# Patient Record
Sex: Male | Born: 1955 | Race: Black or African American | Hispanic: No | Smoking: Current some day smoker
Health system: Southern US, Community
[De-identification: ages and names within clinical notes are randomized; demographics above are authoritative.]

## PROBLEM LIST (undated history)

## (undated) DIAGNOSIS — K219 Gastro-esophageal reflux disease without esophagitis: Secondary | ICD-10-CM

## (undated) DIAGNOSIS — I119 Hypertensive heart disease without heart failure: Secondary | ICD-10-CM

## (undated) DIAGNOSIS — M1990 Unspecified osteoarthritis, unspecified site: Secondary | ICD-10-CM

## (undated) DIAGNOSIS — Z955 Presence of coronary angioplasty implant and graft: Secondary | ICD-10-CM

## (undated) DIAGNOSIS — Z972 Presence of dental prosthetic device (complete) (partial): Secondary | ICD-10-CM

## (undated) DIAGNOSIS — I255 Ischemic cardiomyopathy: Secondary | ICD-10-CM

## (undated) DIAGNOSIS — I252 Old myocardial infarction: Secondary | ICD-10-CM

## (undated) DIAGNOSIS — F1010 Alcohol abuse, uncomplicated: Secondary | ICD-10-CM

## (undated) DIAGNOSIS — I2510 Atherosclerotic heart disease of native coronary artery without angina pectoris: Secondary | ICD-10-CM

## (undated) DIAGNOSIS — E785 Hyperlipidemia, unspecified: Secondary | ICD-10-CM

## (undated) DIAGNOSIS — F1411 Cocaine abuse, in remission: Secondary | ICD-10-CM

## (undated) DIAGNOSIS — I10 Essential (primary) hypertension: Secondary | ICD-10-CM

## (undated) DIAGNOSIS — Z720 Tobacco use: Secondary | ICD-10-CM

## (undated) HISTORY — PX: CORONARY ANGIOPLASTY WITH STENT PLACEMENT: SHX49

## (undated) HISTORY — DX: Gastro-esophageal reflux disease without esophagitis: K21.9

## (undated) HISTORY — PX: FACIAL COSMETIC SURGERY: SHX629

## (undated) HISTORY — PX: LACERATION REPAIR: SHX5168

---

## 2000-06-28 ENCOUNTER — Emergency Department (HOSPITAL_COMMUNITY): Admission: EM | Admit: 2000-06-28 | Discharge: 2000-06-28 | Payer: Self-pay

## 2004-12-06 ENCOUNTER — Ambulatory Visit: Payer: Self-pay | Admitting: Internal Medicine

## 2004-12-07 ENCOUNTER — Emergency Department (HOSPITAL_COMMUNITY): Admission: EM | Admit: 2004-12-07 | Discharge: 2004-12-07 | Payer: Self-pay | Admitting: Emergency Medicine

## 2004-12-23 ENCOUNTER — Ambulatory Visit: Payer: Self-pay | Admitting: Internal Medicine

## 2004-12-28 ENCOUNTER — Emergency Department (HOSPITAL_COMMUNITY): Admission: EM | Admit: 2004-12-28 | Discharge: 2004-12-29 | Payer: Self-pay | Admitting: Emergency Medicine

## 2006-08-15 ENCOUNTER — Ambulatory Visit: Payer: Self-pay | Admitting: Internal Medicine

## 2006-08-30 ENCOUNTER — Ambulatory Visit: Payer: Self-pay | Admitting: *Deleted

## 2006-09-05 ENCOUNTER — Emergency Department (HOSPITAL_COMMUNITY): Admission: EM | Admit: 2006-09-05 | Discharge: 2006-09-06 | Payer: Self-pay | Admitting: Emergency Medicine

## 2007-02-24 ENCOUNTER — Emergency Department (HOSPITAL_COMMUNITY): Admission: EM | Admit: 2007-02-24 | Discharge: 2007-02-25 | Payer: Self-pay | Admitting: Emergency Medicine

## 2007-02-27 ENCOUNTER — Ambulatory Visit: Payer: Self-pay | Admitting: Internal Medicine

## 2007-05-19 ENCOUNTER — Emergency Department (HOSPITAL_COMMUNITY): Admission: EM | Admit: 2007-05-19 | Discharge: 2007-05-19 | Payer: Self-pay | Admitting: Emergency Medicine

## 2007-05-30 ENCOUNTER — Encounter (INDEPENDENT_AMBULATORY_CARE_PROVIDER_SITE_OTHER): Payer: Self-pay | Admitting: *Deleted

## 2007-06-15 ENCOUNTER — Emergency Department (HOSPITAL_COMMUNITY): Admission: EM | Admit: 2007-06-15 | Discharge: 2007-06-15 | Payer: Self-pay | Admitting: Emergency Medicine

## 2007-06-21 ENCOUNTER — Ambulatory Visit: Payer: Self-pay | Admitting: Internal Medicine

## 2007-12-26 ENCOUNTER — Emergency Department (HOSPITAL_COMMUNITY): Admission: EM | Admit: 2007-12-26 | Discharge: 2007-12-26 | Payer: Self-pay | Admitting: Family Medicine

## 2007-12-26 ENCOUNTER — Ambulatory Visit: Payer: Self-pay | Admitting: Internal Medicine

## 2007-12-26 ENCOUNTER — Ambulatory Visit (HOSPITAL_COMMUNITY): Admission: RE | Admit: 2007-12-26 | Discharge: 2007-12-26 | Payer: Self-pay | Admitting: Internal Medicine

## 2007-12-27 ENCOUNTER — Emergency Department (HOSPITAL_COMMUNITY): Admission: EM | Admit: 2007-12-27 | Discharge: 2007-12-27 | Payer: Self-pay | Admitting: Emergency Medicine

## 2008-01-03 ENCOUNTER — Ambulatory Visit: Payer: Self-pay | Admitting: Internal Medicine

## 2008-01-03 ENCOUNTER — Emergency Department (HOSPITAL_COMMUNITY): Admission: EM | Admit: 2008-01-03 | Discharge: 2008-01-04 | Payer: Self-pay | Admitting: Emergency Medicine

## 2008-01-04 ENCOUNTER — Ambulatory Visit (HOSPITAL_COMMUNITY): Admission: RE | Admit: 2008-01-04 | Discharge: 2008-01-04 | Payer: Self-pay | Admitting: Internal Medicine

## 2008-01-18 ENCOUNTER — Ambulatory Visit: Payer: Self-pay | Admitting: Internal Medicine

## 2008-01-28 ENCOUNTER — Ambulatory Visit (HOSPITAL_COMMUNITY): Admission: RE | Admit: 2008-01-28 | Discharge: 2008-01-28 | Payer: Self-pay | Admitting: Internal Medicine

## 2008-03-12 ENCOUNTER — Ambulatory Visit: Payer: Self-pay | Admitting: Internal Medicine

## 2008-06-19 IMAGING — CR DG CHEST 2V
2 series · 2 of 2 positions shown · non-contrast
Comparison: none

CLINICAL DATA: Chest pain.
CHEST ? 2 VIEW:

[w chest pa]
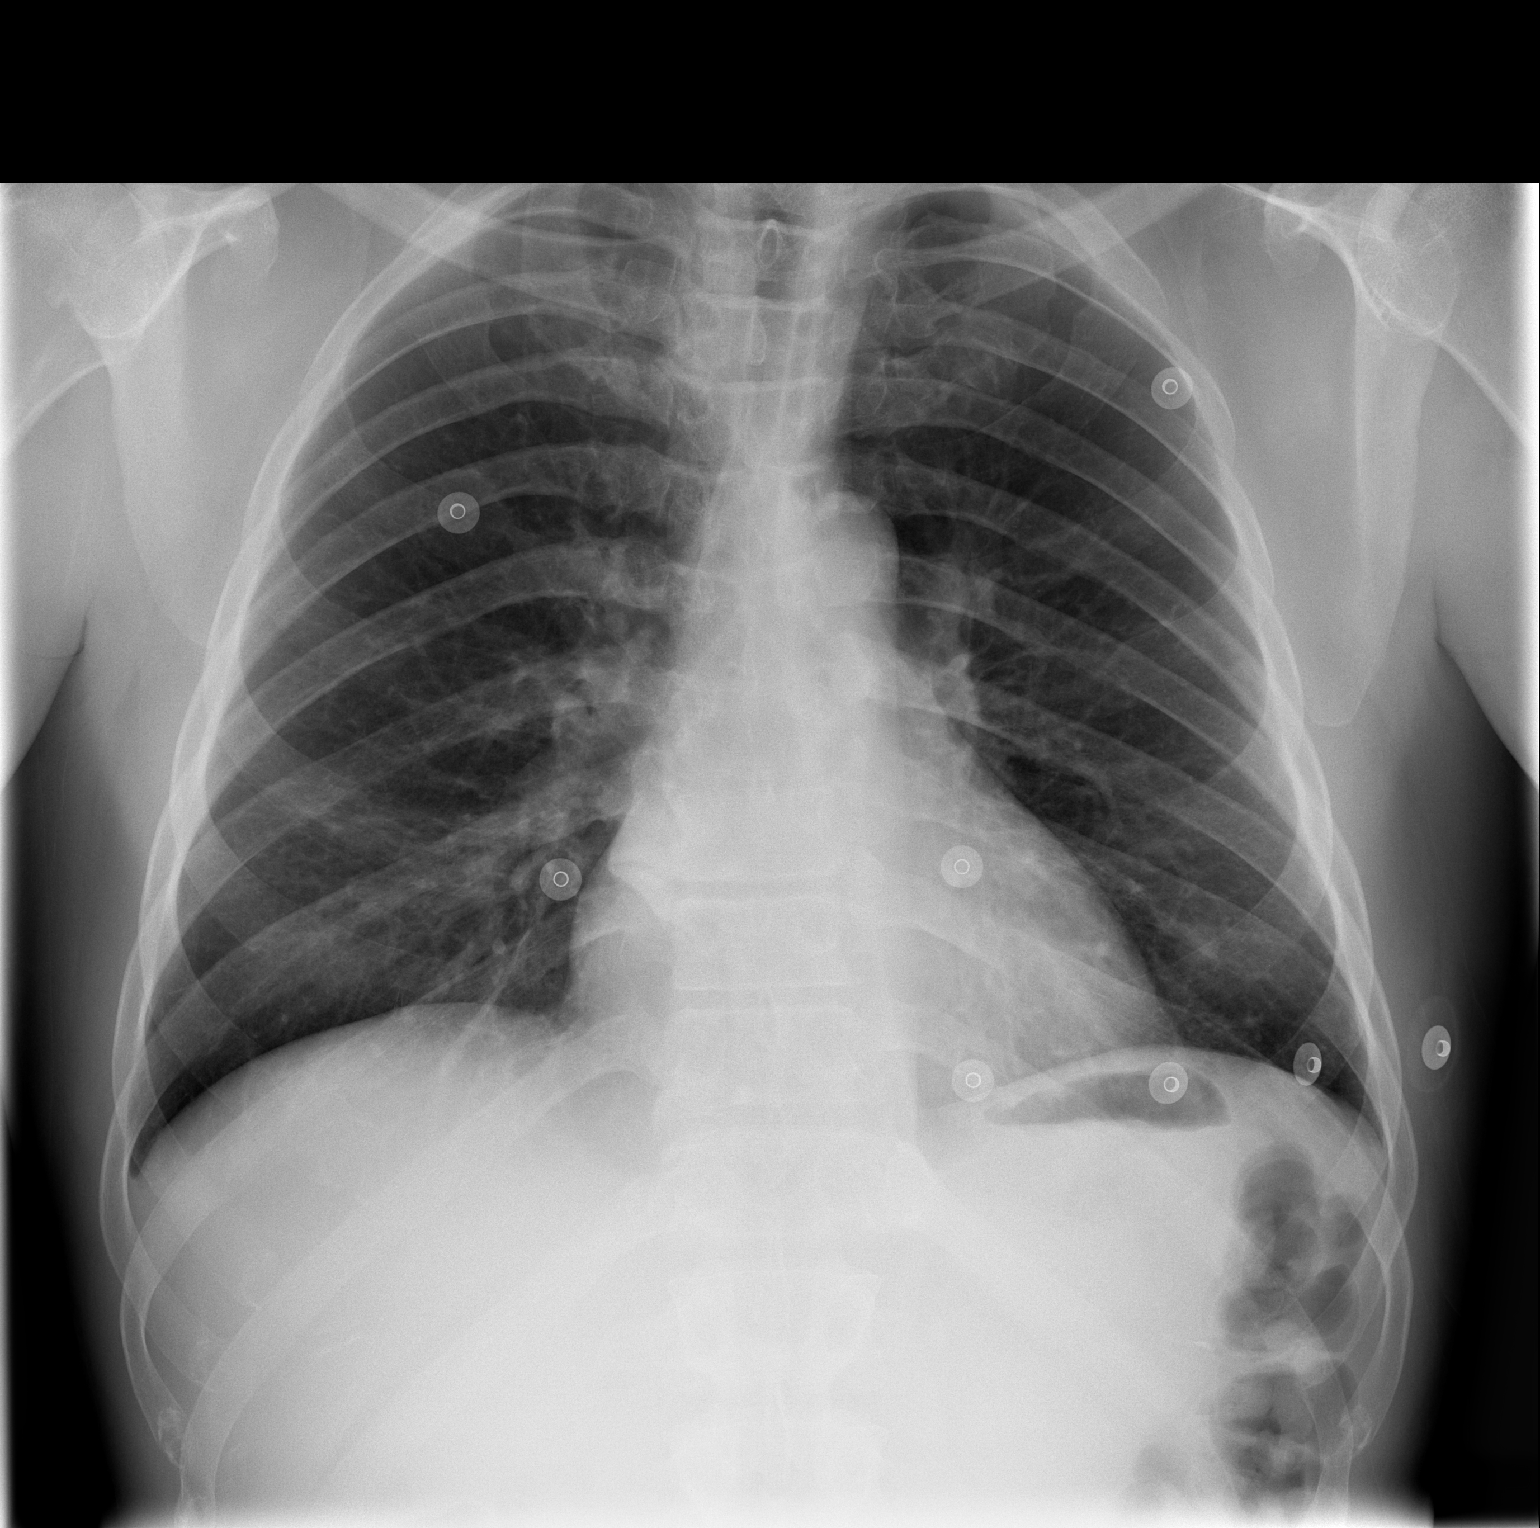

[w chest lat]
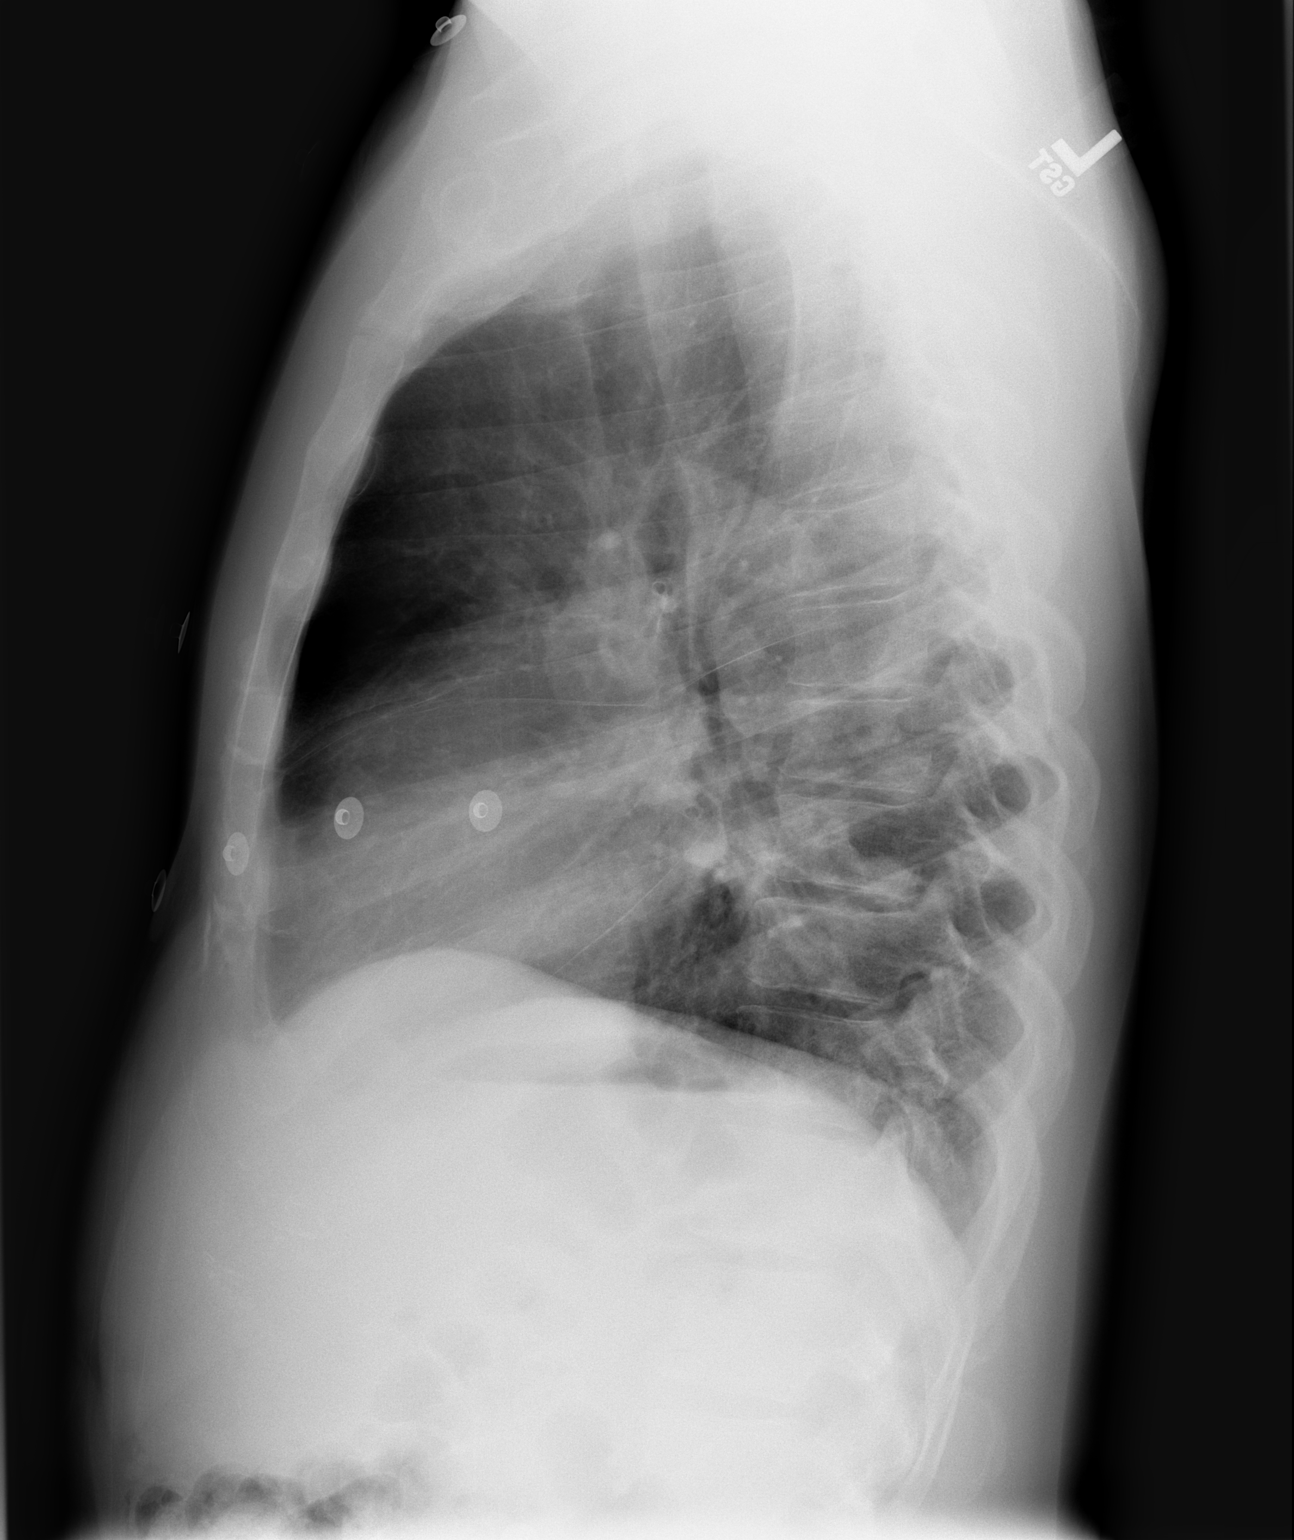

[2 of 2 positions shown; findings below may reference images not displayed]

FINDINGS: The cardiomediastinal silhouette is within normal limits.  Prominent infrahilar/perihilar region on the lateral film is noted, which could represent overlapping structures, but cannot entirely exclude mass.  Consider CT if no comparison films are available.  Mild peribronchial thickening and mild basilar atelectasis/scarring noted.
IMPRESSION: 1.  Prominent infrahilar/perihilar region on the lateral film.  If no old films are available for comparison, consider CT. 
2.  Mild peribronchial thickening and mild basilar atelectasis/scarring.

## 2008-06-19 IMAGING — CT CT CHEST W/ CM
3 series · 18 of 30 positions shown, 19 images · IV contrast (omnipaque)
Comparison: none

CLINICAL DATA: Chest pain.  Prominent perihilar region on recent chest x-ray. 
CHEST CT WITH CONTRAST:
TECHNIQUE: Multidetector CT imaging of the chest was performed following the standard protocol during bolus administration of intravenous contrast.
Contrast:  80 cc Omnipaque 300.

[Series 2: routine chest · axial · 0.70mm/px · z∈[-293,-143]mm · 4 of 61 slices shown, 5 images]
[im 16/61  mediastinal]
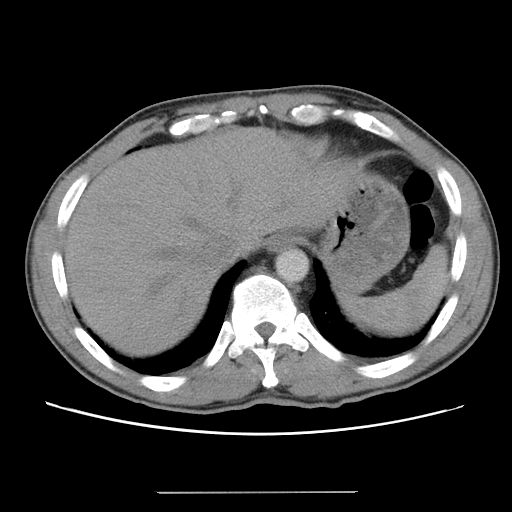
[im 16/61  lung]
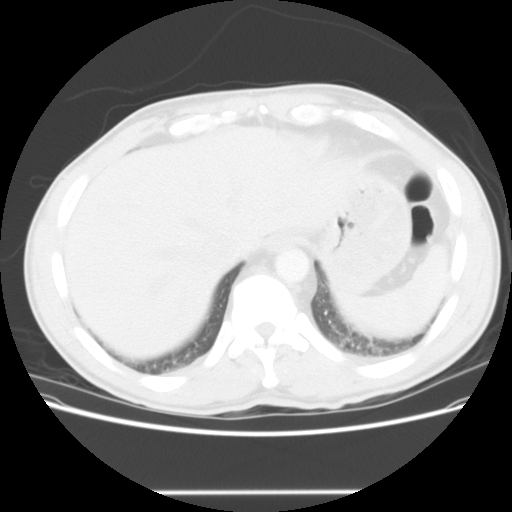
[im 31/61  lung]
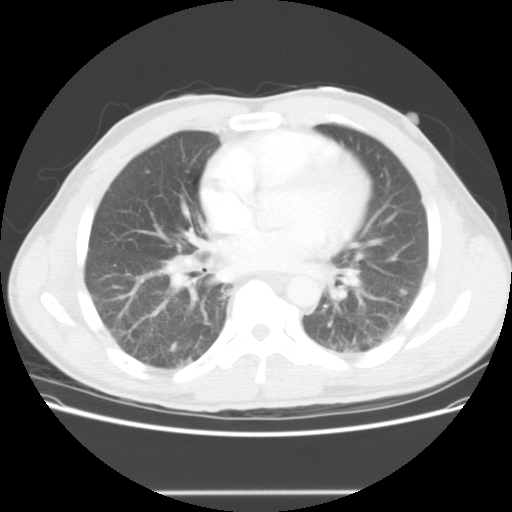
[im 32/61  lung]
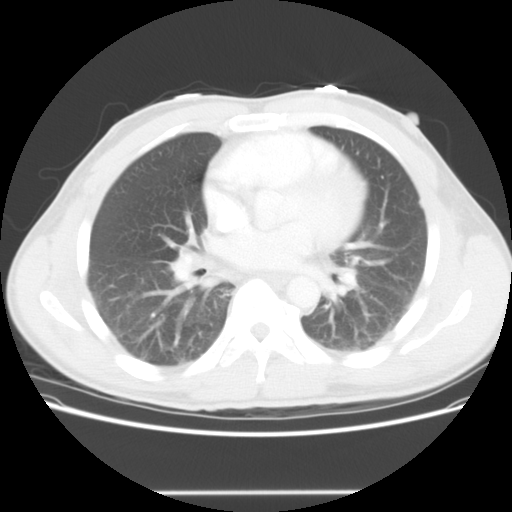
[im 46/61  lung]
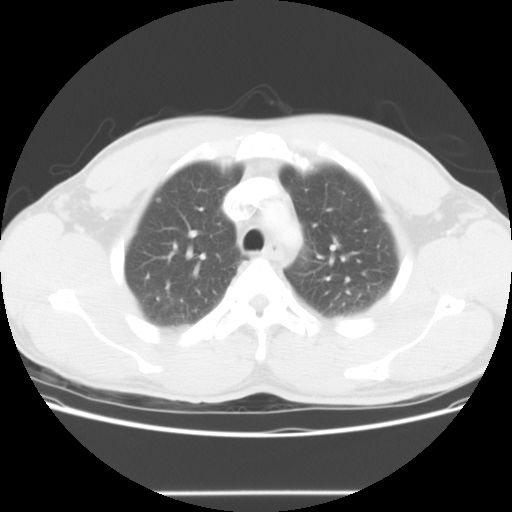

[Series 400: reformatted · sagittal · 0.70mm/px · 8 of 146 slices shown (1 of 2)]
[im 14/146  lung]
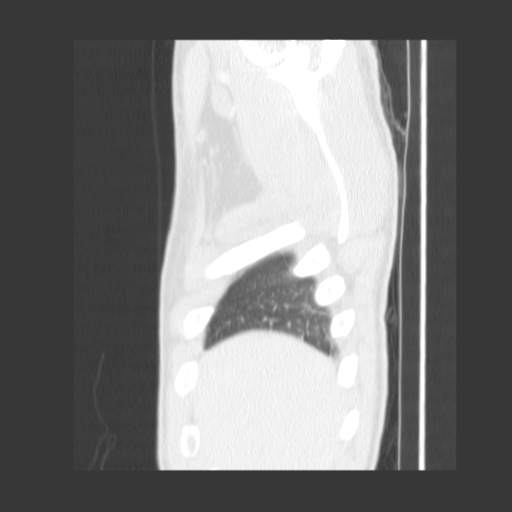
[im 40/146  lung]
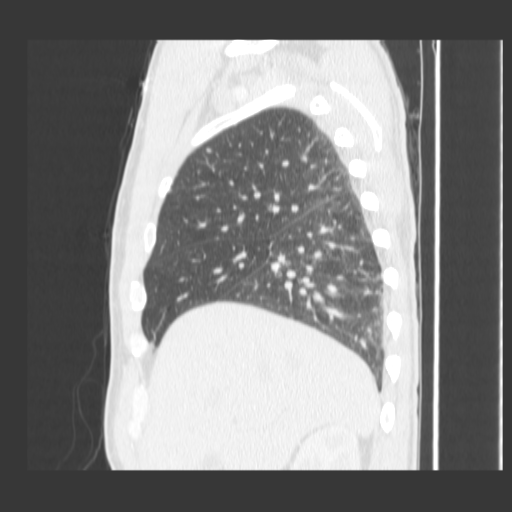
[im 53/146  lung]
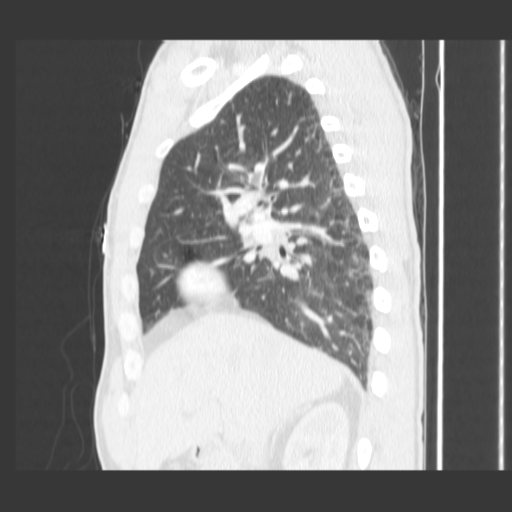
[im 66/146  lung]
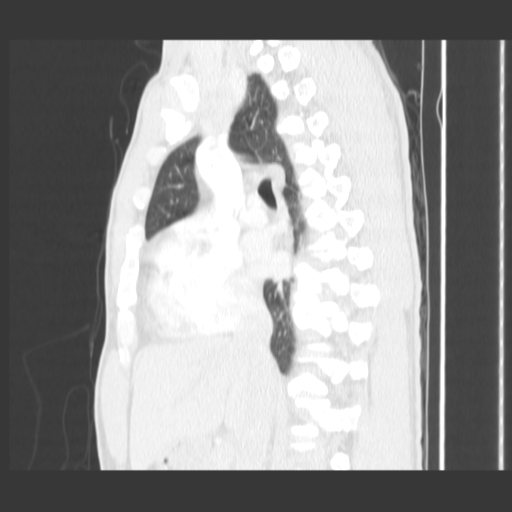
[im 80/146  lung]
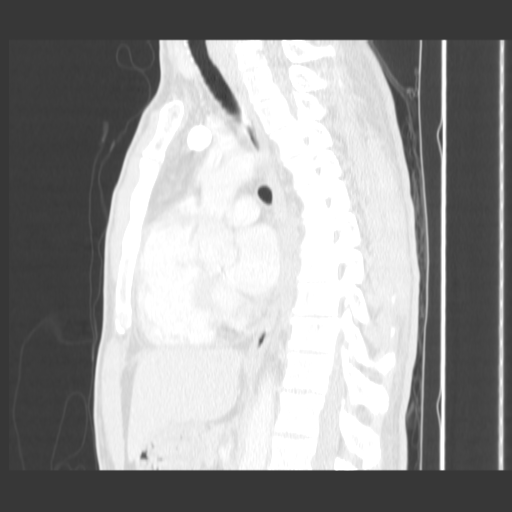
[im 93/146  lung]
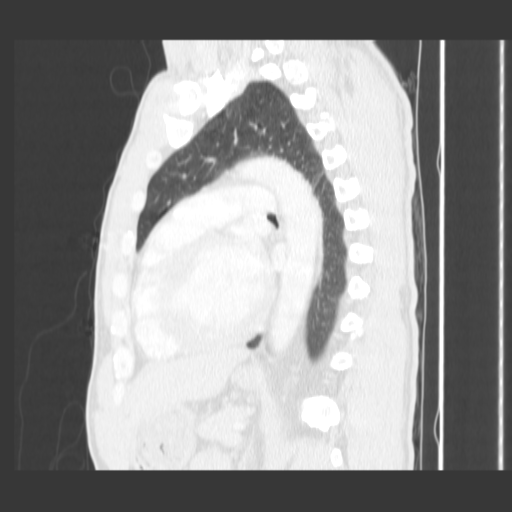
[im 106/146  lung]
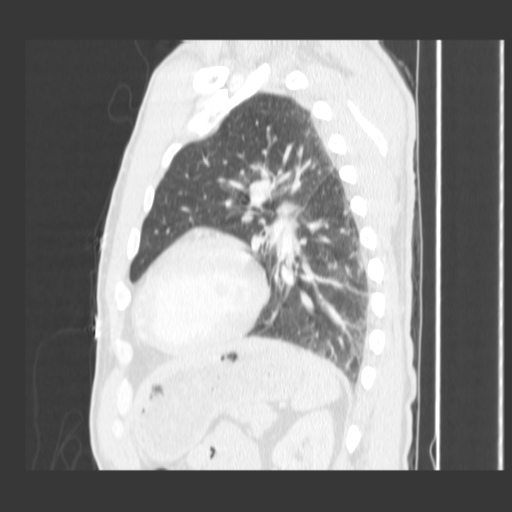
[im 132/146  lung]
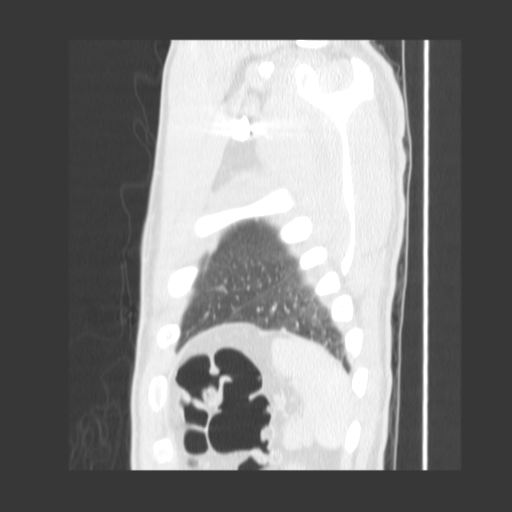

[Series 401: reformatted · coronal · 0.70mm/px · 6 of 126 slices shown (2 of 2)]
[im 13/126  lung]
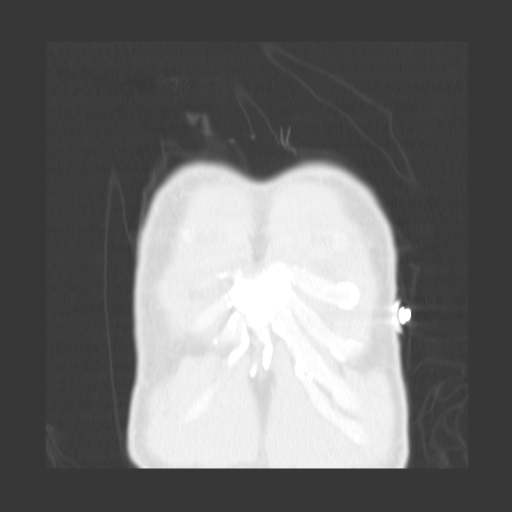
[im 26/126  lung]
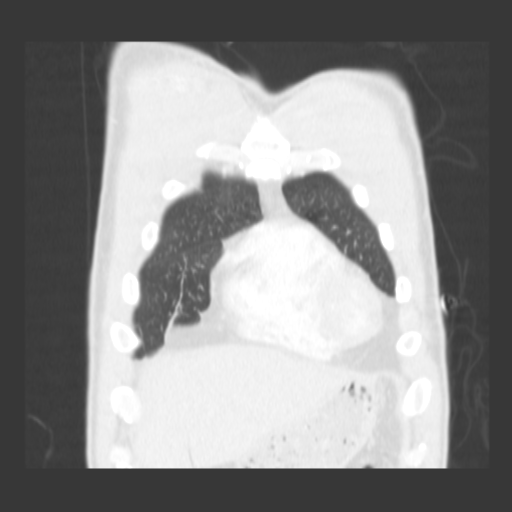
[im 38/126  lung]
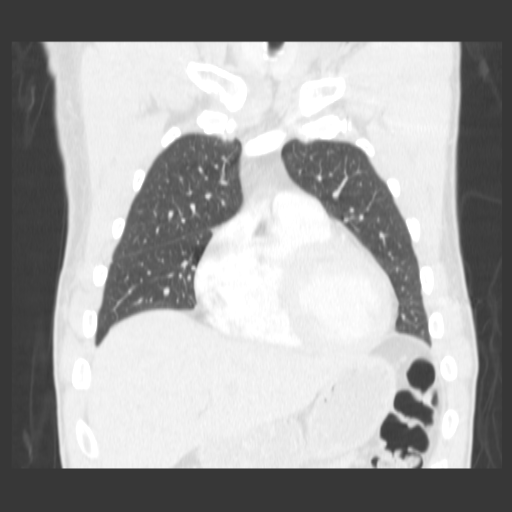
[im 51/126  lung]
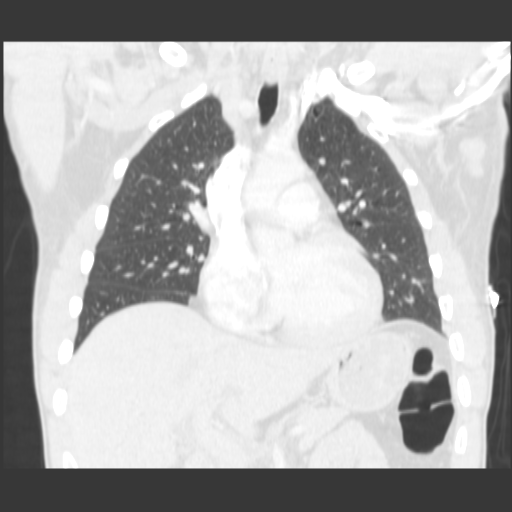
[im 76/126  lung]
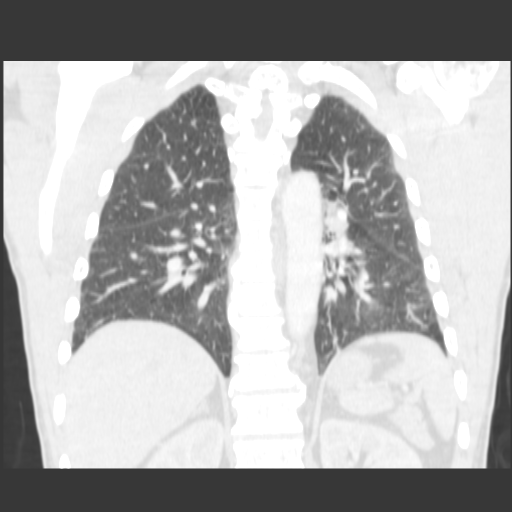
[im 88/126  lung]
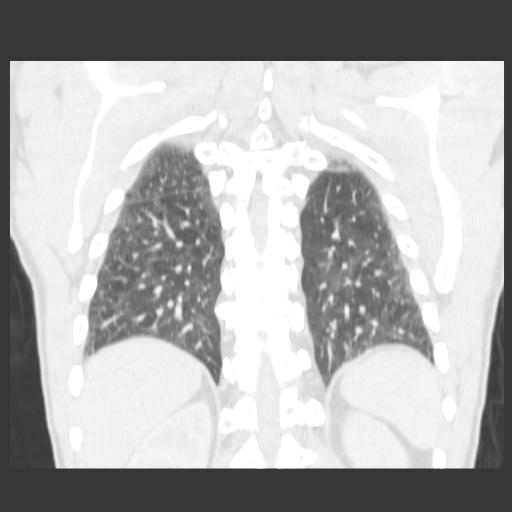

[18 of 30 positions shown; findings below may reference images not displayed]

FINDINGS: The heart and great vessels are within normal limits.  Mildly enlarged upper limits of normal bilateral hilar lymph nodes are likely reactive or granulomatous as there is a calcified right hilar node present.  No evidence of enlarged lymph nodes within the axillary or mediastinal regions.  No pleural or pericardial effusions are identified.  There are multiple small bilateral pulmonary nodules, the vast majority along the pleural surface with the largest of these nodules measures 5 mm.  Mild central peribronchial thickening is noted.  Minimal intralobular septal thickening is identified.
IMPRESSION: 1.  Very mildly enlarged bilateral hilar lymph nodes, likely reactive.
2.  Multiple small bilateral pulmonary nodules, almost all pleural based - likely small granulomas, as there is a calcified right hilar lymph node present.  Consider interval CT followup in six months.

## 2008-09-10 ENCOUNTER — Ambulatory Visit: Payer: Self-pay | Admitting: Internal Medicine

## 2008-09-10 LAB — CONVERTED CEMR LAB
Basophils Absolute: 0 10*3/uL (ref 0.0–0.1)
HCT: 51.3 % (ref 39.0–52.0)
HDL: 41 mg/dL (ref >39)
Hemoglobin: 16.9 g/dL (ref 13.0–17.0)
Lymphocytes Relative: 33 % (ref 12–46)
MCV: 94.3 fL (ref 78.0–100.0)
Monocytes Absolute: 0.6 10*3/uL (ref 0.1–1.0)
Neutro Abs: 3.3 10*3/uL (ref 1.7–7.7)
Neutrophils Relative %: 53 % (ref 43–77)
PSA: 1.31 ng/mL (ref 0.10–4.00)
Platelets: 227 10*3/uL (ref 150–400)
RDW: 14.4 % (ref 11.5–15.5)
Total CHOL/HDL Ratio: 3.8
Triglycerides: 194 mg/dL — ABNORMAL HIGH (ref <150)

## 2008-12-08 ENCOUNTER — Emergency Department (HOSPITAL_COMMUNITY): Admission: EM | Admit: 2008-12-08 | Discharge: 2008-12-09 | Payer: Self-pay | Admitting: Emergency Medicine

## 2008-12-10 ENCOUNTER — Emergency Department (HOSPITAL_COMMUNITY): Admission: EM | Admit: 2008-12-10 | Discharge: 2008-12-10 | Payer: Self-pay | Admitting: Emergency Medicine

## 2009-08-04 ENCOUNTER — Ambulatory Visit: Payer: Self-pay | Admitting: Internal Medicine

## 2009-08-04 LAB — CONVERTED CEMR LAB
GC Probe Amp, Urine: NEGATIVE
PSA: 1.16 ng/mL (ref 0.10–4.00)
RPR Ser Ql: REACTIVE — AB
RPR Titer: 1:1 {titer}

## 2009-08-05 ENCOUNTER — Encounter (INDEPENDENT_AMBULATORY_CARE_PROVIDER_SITE_OTHER): Payer: Self-pay | Admitting: Internal Medicine

## 2009-08-24 ENCOUNTER — Ambulatory Visit: Payer: Self-pay | Admitting: Internal Medicine

## 2009-09-29 ENCOUNTER — Emergency Department (HOSPITAL_COMMUNITY): Admission: EM | Admit: 2009-09-29 | Discharge: 2009-09-29 | Payer: Self-pay | Admitting: Emergency Medicine

## 2010-10-03 ENCOUNTER — Encounter: Payer: Self-pay | Admitting: Internal Medicine

## 2010-12-23 LAB — WOUND CULTURE

## 2010-12-23 LAB — GLUCOSE, CAPILLARY: Glucose-Capillary: 116 mg/dL — ABNORMAL HIGH (ref 70–99)

## 2011-06-24 LAB — POCT CARDIAC MARKERS
CKMB, poc: <1 — ABNORMAL LOW
Myoglobin, poc: 26.9
Operator id: 272551
Troponin i, poc: <0.05

## 2011-06-24 LAB — I-STAT 8, (EC8 V) (CONVERTED LAB)
Chloride: 105
Glucose, Bld: 100 — ABNORMAL HIGH
HCT: 44
Operator id: 272551
Potassium: 3.9
Sodium: 138
TCO2: 27
pCO2, Ven: 43.9 — ABNORMAL LOW
pH, Ven: 7.377 — ABNORMAL HIGH

## 2011-06-24 LAB — POCT I-STAT CREATININE: Creatinine, Ser: 1.1

## 2011-06-30 LAB — DIFFERENTIAL
Basophils Absolute: 0
Basophils Relative: 0
Eosinophils Relative: 1
Lymphocytes Relative: 11 — ABNORMAL LOW
Monocytes Absolute: 0.8 — ABNORMAL HIGH
Monocytes Relative: 8

## 2011-06-30 LAB — URINE MICROSCOPIC-ADD ON

## 2011-06-30 LAB — URINALYSIS, ROUTINE W REFLEX MICROSCOPIC
Bilirubin Urine: NEGATIVE
Ketones, ur: NEGATIVE
Nitrite: POSITIVE — AB
pH: 7.5

## 2011-06-30 LAB — COMPREHENSIVE METABOLIC PANEL
AST: 16
Albumin: 3.3 — ABNORMAL LOW
Alkaline Phosphatase: 55
Chloride: 104
GFR calc Af Amer: >60
Potassium: 3.8
Total Bilirubin: 1.1
Total Protein: 6.4

## 2011-06-30 LAB — CBC
Platelets: 249
WBC: 11.1 — ABNORMAL HIGH

## 2011-10-12 ENCOUNTER — Emergency Department (HOSPITAL_COMMUNITY)
Admission: EM | Admit: 2011-10-12 | Discharge: 2011-10-12 | Disposition: A | Discharge status: Home / Self Care | Payer: Self-pay | Attending: Emergency Medicine | Admitting: Emergency Medicine

## 2011-10-12 ENCOUNTER — Encounter (HOSPITAL_COMMUNITY): Payer: Self-pay | Admitting: *Deleted

## 2011-10-12 DIAGNOSIS — G479 Sleep disorder, unspecified: Secondary | ICD-10-CM | POA: Insufficient documentation

## 2011-10-12 MED ORDER — DIPHENHYDRAMINE HCL 25 MG PO TABS: 25.0000 mg | ORAL_TABLET | Freq: Four times a day (QID) | ORAL | Status: AC

## 2011-10-12 NOTE — ED Provider Notes (Signed)
History     CSN: [id number]  Arrival date & time 10/12/11  1449   First MD Initiated Contact with Patient 10/12/11 1604      Chief Complaint  Patient presents with  . Insomnia    (Consider location/radiation/quality/duration/timing/severity/associated sxs/prior treatment) HPI Comments: Patient reports difficulty sleeping for the past 6 years. Came in today because his wife is also being seen. He states he takes up to 45 minutes before sleep because turns a lot. He denies any alcohol, drug use, medication use. Does not have any other medical problems. His white cells and many cigars occasionally. Denies any chest pain, shortness of breath, headache, nausea or vomiting. Is not trying to sleep aids. He states he has vivid frequent dreams.  No suicidal or homicidal thoughts.  The history is provided by the patient.    History reviewed. No pertinent past medical history.  History reviewed. No pertinent past surgical history.  History reviewed. No pertinent family history.  History  Substance Use Topics  . Smoking status: Current Everyday Smoker    Types: Cigarettes  . Smokeless tobacco: Not on file  . Alcohol Use: Yes      Review of Systems  Constitutional: Negative for activity change, appetite change and fatigue.  HENT: Negative for congestion and rhinorrhea.   Respiratory: Negative for cough, chest tightness and shortness of breath.   Cardiovascular: Negative for chest pain.  Gastrointestinal: Negative for vomiting and abdominal pain.  Genitourinary: Negative for dysuria and hematuria.  Musculoskeletal: Negative for back pain.  Skin: Negative for rash.  Neurological: Negative for headaches.    Allergies  Review of patient's allergies indicates no known allergies.  Home Medications   Current Outpatient Rx  Name Route Sig Dispense Refill  . DIPHENHYDRAMINE HCL 25 MG PO TABS Oral Take 1 tablet (25 mg total) by mouth every 6 (six) hours. 20 tablet 0    BP 134/89   Pulse 78  Temp(Src) 98.4 F (36.9 C) (Oral)  Resp 18  SpO2 96%  Physical Exam  Constitutional: He is oriented to person, place, and time. He appears well-developed and well-nourished. No distress.  HENT:  Head: Normocephalic and atraumatic.  Mouth/Throat: Oropharynx is clear and moist. No oropharyngeal exudate.  Eyes: Conjunctivae are normal. Pupils are equal, round, and reactive to light.  Neck: Normal range of motion.  Cardiovascular: Normal rate, regular rhythm and normal heart sounds.   Pulmonary/Chest: Effort normal and breath sounds normal. No respiratory distress.  Abdominal: Soft. There is no tenderness. There is no rebound and no guarding.  Musculoskeletal: Normal range of motion. He exhibits no edema and no tenderness.  Neurological: He is alert and oriented to person, place, and time. No cranial nerve deficit.  Skin: Skin is warm.    ED Course  Procedures (including critical care time)  Labs Reviewed - No data to display No results found.   1. Sleep disturbance       MDM  Chronic sleep disturbance. Normal neurological exam. No homicidal or suicidal thoughts.  Advised Benadryl trial and establish care with PCP.       Glynn Octave, MD 10/12/11 2151

## 2011-10-12 NOTE — ED Notes (Signed)
Reports not being able to sleep x 5-6 years. No distress noted at triage.

## 2012-07-13 ENCOUNTER — Encounter (HOSPITAL_COMMUNITY): Payer: Self-pay | Admitting: Emergency Medicine

## 2012-07-13 ENCOUNTER — Emergency Department (HOSPITAL_COMMUNITY)
Admission: EM | Admit: 2012-07-13 | Discharge: 2012-07-13 | Disposition: A | Discharge status: Home / Self Care | Payer: Self-pay | Attending: Emergency Medicine | Admitting: Emergency Medicine

## 2012-07-13 DIAGNOSIS — R220 Localized swelling, mass and lump, head: Secondary | ICD-10-CM | POA: Insufficient documentation

## 2012-07-13 DIAGNOSIS — K047 Periapical abscess without sinus: Secondary | ICD-10-CM | POA: Insufficient documentation

## 2012-07-13 DIAGNOSIS — F17200 Nicotine dependence, unspecified, uncomplicated: Secondary | ICD-10-CM | POA: Insufficient documentation

## 2012-07-13 DIAGNOSIS — R221 Localized swelling, mass and lump, neck: Secondary | ICD-10-CM | POA: Insufficient documentation

## 2012-07-13 MED ORDER — KETOROLAC TROMETHAMINE 60 MG/2ML IM SOLN
60.0000 mg | Freq: Once | INTRAMUSCULAR | Status: AC
  Administered 2012-07-13: 60 mg via INTRAMUSCULAR
  Filled 2012-07-13: qty 2

## 2012-07-13 MED ORDER — OXYCODONE-ACETAMINOPHEN 5-325 MG PO TABS
2.0000 | ORAL_TABLET | Freq: Once | ORAL | Status: AC
  Administered 2012-07-13: 2 via ORAL
  Filled 2012-07-13: qty 2

## 2012-07-13 MED ORDER — METOCLOPRAMIDE HCL 5 MG/ML IJ SOLN
10.0000 mg | Freq: Once | INTRAMUSCULAR | Status: DC
  Filled 2012-07-13: qty 2

## 2012-07-13 MED ORDER — SODIUM CHLORIDE 0.9 % IV SOLN: Freq: Once | INTRAVENOUS | Status: DC

## 2012-07-13 MED ORDER — AMOXICILLIN 500 MG PO CAPS: 500.0000 mg | ORAL_CAPSULE | Freq: Three times a day (TID) | ORAL | Status: DC

## 2012-07-13 MED ORDER — DIPHENHYDRAMINE HCL 50 MG/ML IJ SOLN
25.0000 mg | Freq: Once | INTRAMUSCULAR | Status: DC
  Filled 2012-07-13: qty 1

## 2012-07-13 MED ORDER — OXYCODONE-ACETAMINOPHEN 5-325 MG PO TABS: 1.0000 | ORAL_TABLET | ORAL | Status: DC | PRN

## 2012-07-13 MED ORDER — NAPROXEN 500 MG PO TABS: 500.0000 mg | ORAL_TABLET | Freq: Two times a day (BID) | ORAL | Status: DC

## 2012-07-13 MED ORDER — KETOROLAC TROMETHAMINE 30 MG/ML IJ SOLN
30.0000 mg | Freq: Once | INTRAMUSCULAR | Status: DC
  Filled 2012-07-13: qty 1

## 2012-07-13 NOTE — ED Notes (Signed)
Pt states he has a knot on his right shoulder cause unknown

## 2012-07-13 NOTE — ED Provider Notes (Signed)
History     CSN: [id number]  Arrival date & time 07/13/12  9528   First MD Initiated Contact with Patient 07/13/12 [phone]      Chief Complaint  Patient presents with  . Headache    (Consider location/radiation/quality/duration/timing/severity/associated sxs/prior treatment) HPI Comments: 56 year old male with a history of poor dentition who presents with several days of headache associated with it gradually worsening dental pain which is in the upper lip. This is persistent, gradually worsening, not associated with fevers nausea vomiting or difficulty swallowing. He has no pain under his tongue. He has had no medication prior to arrival.  Patient is a 56 y.o. male presenting with headaches. The history is provided by the patient and a relative.  Headache  Pertinent negatives include no fever.    History reviewed. No pertinent past medical history.  History reviewed. No pertinent past surgical history.  Family History  Problem Relation Age of Onset  . Heart attack Father   . Heart attack Sister     History  Substance Use Topics  . Smoking status: Current Every Day Smoker    Types: Cigarettes  . Smokeless tobacco: Not on file  . Alcohol Use: Yes      Review of Systems  Constitutional: Negative for fever.  HENT: Positive for facial swelling.         Tooth ache  Neurological: Positive for headaches.    Allergies  Review of patient's allergies indicates no known allergies.  Home Medications   Current Outpatient Rx  Name Route Sig Dispense Refill  . IBUPROFEN 200 MG PO TABS Oral Take 400 mg by mouth every 6 (six) hours as needed. Pain    . AMOXICILLIN 500 MG PO CAPS Oral Take 1 capsule (500 mg total) by mouth 3 (three) times daily. 21 capsule 0  . NAPROXEN 500 MG PO TABS Oral Take 1 tablet (500 mg total) by mouth 2 (two) times daily with a meal. 30 tablet 0  . OXYCODONE-ACETAMINOPHEN 5-325 MG PO TABS Oral Take 1 tablet by mouth every 4 (four) hours as needed for  pain. 20 tablet 0    BP 152/88  Pulse 76  Temp 98.4 F (36.9 C) (Oral)  Resp 17  SpO2 95%  Physical Exam  Constitutional: He appears well-developed and well-nourished. No distress.  HENT:  Head: Normocephalic and atraumatic.  Mouth/Throat: Oropharynx is clear and moist. No oropharyngeal exudate.       Poor dentition, multiple fractured and missing teeth, small abscess to the upper gums, no areas of fluctuance, no pain in the sublingual area  Eyes: Conjunctivae normal are normal. Right eye exhibits no discharge. No scleral icterus.  Neck: Normal range of motion. Neck supple.  Cardiovascular: Normal rate, regular rhythm, normal heart sounds and intact distal pulses.   Pulmonary/Chest: Effort normal.  Musculoskeletal: Normal range of motion. He exhibits no edema and no tenderness.  Lymphadenopathy:    He has no cervical adenopathy.  Neurological: He is alert. Coordination normal.       Gait is normal, speech is clear and goal-directed  Skin: Skin is warm and dry. No rash noted. He is not diaphoretic.    ED Course  Procedures (including critical care time)  Labs Reviewed - No data to display No results found.   1. Dental abscess       MDM  Though the patient is a headache I suspect it is coming from the abscess or phlegmon located in the upper gums. He has no lymphadenopathy, no  fever no tachycardia. At this point he has no problems with his phonation or his swallowing or speaking. Will treat with antibiotics and pain medication, plan on discharge with followup with dentist.       Discharge Prescriptions include:  Percocet, Naprosyn, amoxicillin  Vida Roller, MD 07/13/12 [phone]

## 2012-07-13 NOTE — ED Notes (Signed)
Pt states he is having problems sleeping and states when he does he has dreams that keep him awake like tonight he dreamed his friends mother died   Pt states today he woke with a bad headache and feels like his face is swollen  Pt states it feels as if he has been beat up  Pt states he may have an abscess in his teeth

## 2012-08-21 ENCOUNTER — Encounter (HOSPITAL_COMMUNITY): Payer: Self-pay | Admitting: *Deleted

## 2012-08-21 ENCOUNTER — Emergency Department (HOSPITAL_COMMUNITY)
Admission: EM | Admit: 2012-08-21 | Discharge: 2012-08-21 | Disposition: A | Discharge status: Home / Self Care | Payer: Worker's Compensation | Attending: Emergency Medicine | Admitting: Emergency Medicine

## 2012-08-21 DIAGNOSIS — M79603 Pain in arm, unspecified: Secondary | ICD-10-CM

## 2012-08-21 DIAGNOSIS — M19029 Primary osteoarthritis, unspecified elbow: Secondary | ICD-10-CM | POA: Insufficient documentation

## 2012-08-21 DIAGNOSIS — M25519 Pain in unspecified shoulder: Secondary | ICD-10-CM | POA: Insufficient documentation

## 2012-08-21 DIAGNOSIS — F17200 Nicotine dependence, unspecified, uncomplicated: Secondary | ICD-10-CM | POA: Insufficient documentation

## 2012-08-21 DIAGNOSIS — M25529 Pain in unspecified elbow: Secondary | ICD-10-CM | POA: Insufficient documentation

## 2012-08-21 MED ORDER — IBUPROFEN 600 MG PO TABS: 600.0000 mg | ORAL_TABLET | Freq: Four times a day (QID) | ORAL | Status: DC | PRN

## 2012-08-21 MED ORDER — HYDROCODONE-ACETAMINOPHEN 5-325 MG PO TABS
1.0000 | ORAL_TABLET | Freq: Once | ORAL | Status: AC
  Administered 2012-08-21: 1 via ORAL
  Filled 2012-08-21: qty 1

## 2012-08-21 MED ORDER — HYDROCODONE-ACETAMINOPHEN 5-325 MG PO TABS: ORAL_TABLET | ORAL | Status: DC

## 2012-08-21 NOTE — ED Notes (Signed)
PA at bedside.

## 2012-08-21 NOTE — ED Notes (Addendum)
Pt c/o knot on right shoulder and pain in biceps that "goes all the way down my stomach." Pt reports pain started "one day last week." pt reports "it started hurting when I was lifting heavy boxes at work." Pt also requesting referral to sleep specialist due to not being able to sleep at night.

## 2012-08-21 NOTE — ED Provider Notes (Signed)
History     CSN: [id number]  Arrival date & time 08/21/12  1708   First MD Initiated Contact with Patient 08/21/12 1818      Chief Complaint  Patient presents with  . Shoulder Pain  . Arm Pain    (Consider location/radiation/quality/duration/timing/severity/associated sxs/prior treatment) HPI Comments: Patient presents with complaint of right arm pain for the past 2 weeks. 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 The pain is worse with movement, cough, or if he lies on that side. Holding the arm still makes it better. He cannot even let it swing when he walks. No medications prior to arrival. Onset gradual. Course is constant.   Patient is a 56 y.o. male presenting with shoulder pain and arm pain. The history is provided by the patient.  Shoulder Pain Associated symptoms include arthralgias. Pertinent negatives include no joint swelling, neck pain, numbness or weakness.  Arm Pain Associated symptoms include arthralgias. Pertinent negatives include no joint swelling, neck pain, numbness or weakness.    History reviewed. No pertinent past medical history.  History reviewed. No pertinent past surgical history.  Family History  Problem Relation Age of Onset  . Heart attack Father   . Heart attack Sister     History  Substance Use Topics  . Smoking status: Current Every Day Smoker    Types: Cigarettes  . Smokeless tobacco: Not on file  . Alcohol Use: Yes      Review of Systems  Constitutional: Negative for activity change.  HENT: Negative for neck pain.   Musculoskeletal: Positive for arthralgias. Negative for back pain, joint swelling and gait problem.  Skin: Negative for wound.  Neurological: Negative for weakness and numbness.    Allergies  Review of patient's allergies indicates no known allergies.  Home Medications   Current Outpatient Rx  Name  Route  Sig   Dispense  Refill  . GOODY HEADACHE PO   Oral   Take 1 Package by mouth as needed. Headache         . IBUPROFEN 200 MG PO TABS   Oral   Take 400 mg by mouth every 6 (six) hours as needed. Pain         . NAPROXEN 500 MG PO TABS   Oral   Take 500 mg by mouth as needed. Pain         . HYDROCODONE-ACETAMINOPHEN 5-325 MG PO TABS      Take 1-2 tablets every 6 hours as needed for severe pain   12 tablet   0   . IBUPROFEN 600 MG PO TABS   Oral   Take 1 tablet (600 mg total) by mouth every 6 (six) hours as needed for pain.   20 tablet   0     BP 146/104  Pulse 66  Temp 98.7 F (37.1 C) (Oral)  Resp 20  Ht 5\' 5"  (1.651 m)  Wt 180 lb (81.647 kg)  BMI 29.95 kg/m2  SpO2 99%  Physical Exam  Nursing note and vitals reviewed. Constitutional: He appears well-developed and well-nourished.  HENT:  Head: Normocephalic and atraumatic.  Eyes: Conjunctivae normal are normal.  Neck: Normal range of motion. Neck supple.  Cardiovascular: Normal pulses.   Musculoskeletal: He exhibits tenderness. He exhibits no edema.       Right shoulder: He exhibits tenderness. He exhibits normal range of motion, no bony tenderness and no swelling.  Right elbow: Normal.      Right wrist: Normal.       Cervical back: He exhibits normal range of motion, no tenderness and no bony tenderness.       Right upper arm: He exhibits tenderness (medially). He exhibits no bony tenderness, no swelling and no edema.  Neurological: He is alert. No sensory deficit.       Motor, sensation, and vascular distal to the injury is fully intact.   Skin: Skin is warm and dry.  Psychiatric: He has a normal mood and affect.    ED Course  Procedures (including critical care time)  Labs Reviewed - No data to display No results found.   1. Arm pain    6:42 PM Patient seen and examined.    Vital signs reviewed and are as follows: Filed Vitals:   08/21/12 1736  BP: 146/104  Pulse: 66  Temp: 98.7 F (37.1  C)  Resp: 20   Patient was counseled on RICE protocol and told to rest injury, use ice for no longer than 15 minutes every hour, compress the area, and elevate above the level of their heart as much as possible to reduce swelling.  Questions answered.  Patient verbalized understanding.    Urged orthopedic followup if not improved in one to 2 weeks.  Patient counseled on use of narcotic pain medications. Counseled not to combine these medications with others containing tylenol. Urged not to drink alcohol, drive, or perform any other activities that requires focus while taking these medications. The patient verbalizes understanding and agrees with the plan.     MDM  Patient with arm pain. Possibly radicular in nature. No weakness or numbness in fingers. Normal neurological exam. Will treat conservatively. Sling by orthopedic technician. Orthopedic followup will be needed if pain persists. Do not suspect cord issue, pain is unilateral.        Renne Crigler, PA 08/23/12 1011

## 2012-08-21 NOTE — ED Notes (Addendum)
Pt complains of pain in right arm that goes down right side. 10/10. Pt states he can't lay on right side. Pt states that there is a knot on right shoulder that was not there before all this happened. Bone sticks out further on right shoulder than left.

## 2012-08-23 NOTE — ED Provider Notes (Signed)
Medical screening examination/treatment/procedure(s) were conducted as a shared visit with non-physician practitioner(s) and myself.  I personally evaluated the patient during the encounter  Jshaun Abernathy, MD 08/23/12 1408 

## 2012-08-28 ENCOUNTER — Emergency Department (HOSPITAL_COMMUNITY): Payer: Self-pay

## 2012-08-28 ENCOUNTER — Encounter (HOSPITAL_COMMUNITY): Payer: Self-pay | Admitting: Emergency Medicine

## 2012-08-28 ENCOUNTER — Emergency Department (HOSPITAL_COMMUNITY)
Admission: EM | Admit: 2012-08-28 | Discharge: 2012-08-28 | Disposition: A | Discharge status: Home / Self Care | Payer: Self-pay | Attending: Emergency Medicine | Admitting: Emergency Medicine

## 2012-08-28 DIAGNOSIS — F17200 Nicotine dependence, unspecified, uncomplicated: Secondary | ICD-10-CM | POA: Insufficient documentation

## 2012-08-28 DIAGNOSIS — J189 Pneumonia, unspecified organism: Secondary | ICD-10-CM | POA: Insufficient documentation

## 2012-08-28 DIAGNOSIS — R52 Pain, unspecified: Secondary | ICD-10-CM | POA: Insufficient documentation

## 2012-08-28 DIAGNOSIS — R6883 Chills (without fever): Secondary | ICD-10-CM | POA: Insufficient documentation

## 2012-08-28 DIAGNOSIS — J3489 Other specified disorders of nose and nasal sinuses: Secondary | ICD-10-CM | POA: Insufficient documentation

## 2012-08-28 DIAGNOSIS — J029 Acute pharyngitis, unspecified: Secondary | ICD-10-CM | POA: Insufficient documentation

## 2012-08-28 MED ORDER — AZITHROMYCIN 250 MG PO TABS
500.0000 mg | ORAL_TABLET | Freq: Once | ORAL | Status: AC
  Administered 2012-08-28: 500 mg via ORAL
  Filled 2012-08-28: qty 2

## 2012-08-28 MED ORDER — ALBUTEROL SULFATE HFA 108 (90 BASE) MCG/ACT IN AERS
2.0000 | INHALATION_SPRAY | Freq: Once | RESPIRATORY_TRACT | Status: AC
  Administered 2012-08-28: 2 via RESPIRATORY_TRACT
  Filled 2012-08-28: qty 6.7

## 2012-08-28 MED ORDER — ALBUTEROL SULFATE (5 MG/ML) 0.5% IN NEBU
5.0000 mg | INHALATION_SOLUTION | RESPIRATORY_TRACT | Status: DC
  Administered 2012-08-28: 5 mg via RESPIRATORY_TRACT
  Filled 2012-08-28: qty 1

## 2012-08-28 MED ORDER — PREDNISONE 20 MG PO TABS
60.0000 mg | ORAL_TABLET | Freq: Once | ORAL | Status: AC
Start: 2012-08-28 — End: 2012-08-28
  Administered 2012-08-28: 60 mg via ORAL
  Filled 2012-08-28: qty 3

## 2012-08-28 MED ORDER — HYDROCODONE-HOMATROPINE 5-1.5 MG/5ML PO SYRP: 5.0000 mL | ORAL_SOLUTION | Freq: Four times a day (QID) | ORAL | Status: DC | PRN

## 2012-08-28 MED ORDER — AZITHROMYCIN 250 MG PO TABS: ORAL_TABLET | ORAL | Status: DC

## 2012-08-28 MED ORDER — PREDNISONE 10 MG PO TABS: ORAL_TABLET | ORAL | Status: DC

## 2012-08-28 MED ORDER — IPRATROPIUM BROMIDE 0.02 % IN SOLN
0.5000 mg | RESPIRATORY_TRACT | Status: DC
  Administered 2012-08-28: 0.5 mg via RESPIRATORY_TRACT
  Filled 2012-08-28: qty 2.5

## 2012-08-28 NOTE — ED Notes (Signed)
Pt c/o of generalized body aches and "cold sweats" x3 days, C/o of dry cough x5 days. Denies fever.

## 2012-08-28 NOTE — ED Provider Notes (Signed)
History     CSN: [id number]  Arrival date & time 08/28/12  1191   First MD Initiated Contact with Patient 08/28/12 [phone]      Chief Complaint  Patient presents with  . Cough  . Generalized Body Aches    (Consider location/radiation/quality/duration/timing/severity/associated sxs/prior treatment) HPI Lucas Stark is a 56 y.o. male who presented with complaint of URI symptoms and cough for 2 wks. States having nasal congestion, sore throat, cough. Tried multiple over the counter cold and cough meds with no relief. States cough is non productive. Unable to sleep at night. Denies CP or SOB. States having cold sweats at night, did not check temp at home. No N/V/D. No abdominal pain   History reviewed. No pertinent past medical history.  History reviewed. No pertinent past surgical history.  Family History  Problem Relation Age of Onset  . Heart attack Father   . Heart attack Sister     History  Substance Use Topics  . Smoking status: Current Every Day Smoker    Types: Cigarettes  . Smokeless tobacco: Not on file  . Alcohol Use: Yes      Review of Systems  Constitutional: Positive for chills. Negative for fever and diaphoresis.  HENT: Negative for neck pain and neck stiffness.   Respiratory: Positive for cough. Negative for chest tightness and shortness of breath.   Cardiovascular: Negative.   Gastrointestinal: Negative.   Genitourinary: Negative for dysuria and flank pain.  Musculoskeletal: Negative.   Skin: Negative.   Neurological: Negative for dizziness, weakness and headaches.    Allergies  Review of patient's allergies indicates no known allergies.  Home Medications   Current Outpatient Rx  Name  Route  Sig  Dispense  Refill  . GOODY HEADACHE PO   Oral   Take 1 Package by mouth daily as needed. Headache         . IBUPROFEN 600 MG PO TABS   Oral   Take 1 tablet (600 mg total) by mouth every 6 (six) hours as needed for pain.   20 tablet   0      BP 148/88  Pulse 93  Temp 98.5 F (36.9 C) (Oral)  Resp 15  SpO2 94%  Physical Exam  Nursing note and vitals reviewed. Constitutional: He is oriented to person, place, and time. He appears well-developed and well-nourished. No distress.  Eyes: Conjunctivae normal are normal.  Neck: Neck supple.  Cardiovascular: Normal rate, regular rhythm and normal heart sounds.   Pulmonary/Chest: Effort normal. No respiratory distress. He has no wheezes. He has rales.       Rales in right lower lobe  Abdominal: Soft. He exhibits no distension. There is no tenderness. There is no rebound.  Musculoskeletal: He exhibits no edema.  Neurological: He is alert and oriented to person, place, and time.  Skin: Skin is warm and dry.  Psychiatric: He has a normal mood and affect.    ED Course  Procedures (including critical care time)  Pt with persistent cough for almost 2 wks, worsening in last 3 days. Will get CXR. Pt not wheezing, but is a smoker. Some rales audible  On the right. Will try a neb treatment.   Dg Chest 2 View  08/28/2012  *RADIOLOGY REPORT*  Clinical Data: Cough, fever  CHEST - 2 VIEW  Comparison: 12/26/2007  Findings: Cardiomediastinal silhouette is stable.  There is streaky hazy airspace disease in the right base highly suspicious for infiltrate/pneumonia.  Follow-up to resolution after appropriate  treatment is recommended.  No pulmonary edema.  Bony thorax is stable.  IMPRESSION: Streaky hazy airspace disease in the right base highly suspicious for infiltrate/pneumonia.  Follow-up to resolution after treatment is recommended.   Original Report Authenticated By: Natasha Mead, M.D.    10:32 AM Pt with PNA on CXR. Pt's VS are normal. He is feeling better with a neb treatment. Given a dose of zithromax and prednisone in ED. Pt has no medical problems, not inmmunocompromised. Pt is a good candidate for outpatient treatment. Will start on zpack, prednisone taper, albuterol inhaler given in ED>    Filed Vitals:   08/28/12 0849 08/28/12 0944 08/28/12 1029  BP: 148/88 139/86   Pulse: 93 92   Temp: 98.5 F (36.9 C)    TempSrc: Oral    Resp: 15 24   SpO2: 94% 94% 97%      1. CAP (community acquired pneumonia)       MDM  Pt with URI symptoms and cough for few weeks. CXR showed infiltrate. Pt's VS are normal. He is non toxic. NO signs of systemic disease. Pt will be started on zithromax, prednisone, albuterol. Fist dose of each given in ED. Pt is stable. Follow up with pcp. Return if worsening.        Lottie Mussel, PA 08/28/12 1606

## 2012-08-29 NOTE — ED Provider Notes (Signed)
Medical screening examination/treatment/procedure(s) were performed by non-physician practitioner and as supervising physician I was immediately available for consultation/collaboration.    Nelia Shi, MD 08/29/12 [phone]

## 2012-08-31 ENCOUNTER — Emergency Department (HOSPITAL_COMMUNITY)
Admission: EM | Admit: 2012-08-31 | Discharge: 2012-08-31 | Disposition: A | Discharge status: Home / Self Care | Payer: Self-pay | Attending: Emergency Medicine | Admitting: Emergency Medicine

## 2012-08-31 ENCOUNTER — Emergency Department (HOSPITAL_COMMUNITY): Payer: Self-pay

## 2012-08-31 ENCOUNTER — Encounter (HOSPITAL_COMMUNITY): Payer: Self-pay | Admitting: Emergency Medicine

## 2012-08-31 DIAGNOSIS — R0989 Other specified symptoms and signs involving the circulatory and respiratory systems: Secondary | ICD-10-CM | POA: Insufficient documentation

## 2012-08-31 DIAGNOSIS — R062 Wheezing: Secondary | ICD-10-CM | POA: Insufficient documentation

## 2012-08-31 DIAGNOSIS — R0609 Other forms of dyspnea: Secondary | ICD-10-CM | POA: Insufficient documentation

## 2012-08-31 DIAGNOSIS — R0602 Shortness of breath: Secondary | ICD-10-CM | POA: Insufficient documentation

## 2012-08-31 DIAGNOSIS — J189 Pneumonia, unspecified organism: Secondary | ICD-10-CM

## 2012-08-31 DIAGNOSIS — J3489 Other specified disorders of nose and nasal sinuses: Secondary | ICD-10-CM | POA: Insufficient documentation

## 2012-08-31 DIAGNOSIS — F17200 Nicotine dependence, unspecified, uncomplicated: Secondary | ICD-10-CM | POA: Insufficient documentation

## 2012-08-31 DIAGNOSIS — IMO0002 Reserved for concepts with insufficient information to code with codable children: Secondary | ICD-10-CM | POA: Insufficient documentation

## 2012-08-31 DIAGNOSIS — R197 Diarrhea, unspecified: Secondary | ICD-10-CM | POA: Insufficient documentation

## 2012-08-31 LAB — COMPREHENSIVE METABOLIC PANEL
ALT: 24 U/L (ref 0–53)
CO2: 28 mEq/L (ref 19–32)
Calcium: 9.2 mg/dL (ref 8.4–10.5)
Creatinine, Ser: 0.79 mg/dL (ref 0.50–1.35)
GFR calc Af Amer: >90 mL/min (ref >90)
GFR calc non Af Amer: >90 mL/min (ref >90)
Glucose, Bld: 100 mg/dL — ABNORMAL HIGH (ref 70–99)
Sodium: 136 mEq/L (ref 135–145)
Total Protein: 6.8 g/dL (ref 6.0–8.3)

## 2012-08-31 LAB — CBC WITH DIFFERENTIAL/PLATELET
Basophils Relative: 1 % (ref 0–1)
Eosinophils Absolute: 0 10*3/uL (ref 0.0–0.7)
Lymphs Abs: 2.6 10*3/uL (ref 0.7–4.0)
MCH: 30.7 pg (ref 26.0–34.0)
MCHC: 34.4 g/dL (ref 30.0–36.0)
MCV: 89.4 fL (ref 78.0–100.0)
Monocytes Absolute: 1.2 10*3/uL — ABNORMAL HIGH (ref 0.1–1.0)
Neutrophils Relative %: 67 % (ref 43–77)
Platelets: 309 10*3/uL (ref 150–400)

## 2012-08-31 MED ORDER — SODIUM CHLORIDE 0.9 % IV BOLUS (SEPSIS)
1000.0000 mL | Freq: Once | INTRAVENOUS | Status: AC
  Administered 2012-08-31: 1000 mL via INTRAVENOUS

## 2012-08-31 MED ORDER — LEVOFLOXACIN IN D5W 750 MG/150ML IV SOLN
750.0000 mg | INTRAVENOUS | Status: DC
  Administered 2012-08-31: 750 mg via INTRAVENOUS
  Filled 2012-08-31: qty 150

## 2012-08-31 MED ORDER — ALBUTEROL SULFATE HFA 108 (90 BASE) MCG/ACT IN AERS: 2.0000 | INHALATION_SPRAY | RESPIRATORY_TRACT | Status: DC | PRN

## 2012-08-31 MED ORDER — ALBUTEROL SULFATE (5 MG/ML) 0.5% IN NEBU
5.0000 mg | INHALATION_SOLUTION | Freq: Once | RESPIRATORY_TRACT | Status: AC
  Administered 2012-08-31: 5 mg via RESPIRATORY_TRACT
  Filled 2012-08-31: qty 1

## 2012-08-31 MED ORDER — METHYLPREDNISOLONE SODIUM SUCC 125 MG IJ SOLR
125.0000 mg | Freq: Once | INTRAMUSCULAR | Status: AC
  Administered 2012-08-31: 125 mg via INTRAVENOUS
  Filled 2012-08-31: qty 2

## 2012-08-31 MED ORDER — IPRATROPIUM BROMIDE 0.02 % IN SOLN
0.5000 mg | Freq: Once | RESPIRATORY_TRACT | Status: AC
Start: 2012-08-31 — End: 2012-08-31
  Administered 2012-08-31: 0.5 mg via RESPIRATORY_TRACT
  Filled 2012-08-31: qty 2.5

## 2012-08-31 MED ORDER — PREDNISONE 20 MG PO TABS: ORAL_TABLET | ORAL | Status: DC

## 2012-08-31 MED ORDER — LEVOFLOXACIN 750 MG PO TABS: ORAL_TABLET | ORAL | Status: DC

## 2012-08-31 MED ORDER — AEROCHAMBER Z-STAT PLUS/MEDIUM MISC
1.0000 | Freq: Once | Status: AC
  Administered 2012-08-31: 1

## 2012-08-31 MED ORDER — IPRATROPIUM BROMIDE 0.02 % IN SOLN
0.5000 mg | Freq: Once | RESPIRATORY_TRACT | Status: AC
  Administered 2012-08-31: 0.5 mg via RESPIRATORY_TRACT
  Filled 2012-08-31: qty 2.5

## 2012-08-31 NOTE — ED Provider Notes (Signed)
History     CSN: [id number]  Arrival date & time 08/31/12  0815   First MD Initiated Contact with Patient 08/31/12 0840      Chief Complaint  Patient presents with  . Influenza    (Consider location/radiation/quality/duration/timing/severity/associated sxs/prior treatment) HPI  Patient reports he started feeling ill last week. He relates he was seen in the ED and diagnosed with influenza however review of his chart shows he was actually diagnosed with community-acquired pneumonia. He has been on medications and states he's feeling worse. He states he continues to feel hot and cold, he still has a dry cough and he is denying fever. He states last week he had right-sided chest pain when he coughed and now he has pain also on his left side when he coughs. He states he has rhinorrhea that is clear and mainly from his left nostril. He denies sore throat or vomiting but states she's having diarrhea about twice a day. He states he has shortness of breath and dyspnea on exertion and he has been having wheezing. He denies having an inhaler.  PCP none  History reviewed. No pertinent past medical history.  History reviewed. No pertinent past surgical history.  Family History  Problem Relation Age of Onset  . Heart attack Father   . Heart attack Sister     History  Substance Use Topics  . Smoking status: Current Every Day Smoker 1 ppd    Types: Cigarettes  . Smokeless tobacco: Not on file  . Alcohol Use: Yes   employed   Review of Systems  All other systems reviewed and are negative.    Allergies  Review of patient's allergies indicates no known allergies.  Home Medications   Current Outpatient Rx  Name  Route  Sig  Dispense  Refill  . HYDROCODONE-HOMATROPINE 5-1.5 MG/5ML PO SYRP   Oral   Take 5 mLs by mouth every 6 (six) hours as needed. Cough         . IBUPROFEN 600 MG PO TABS   Oral   Take 1 tablet (600 mg total) by mouth every 6 (six) hours as needed for pain.  20 tablet   0   . PREDNISONE 10 MG PO TABS      Take 5 tab day 1, take 4 tab day 2, take 3 tab day 3, take 2 tab day 4, and take 1 tab day 5         . AZITHROMYCIN 250 MG PO TABS   Oral   Take 250 mg by mouth daily. One tab PO QD           BP 151/89  Pulse 73  Temp 98.6 F (37 C) (Oral)  Resp 20  Ht 5\' 5"  (1.651 m)  Wt 180 lb (81.647 kg)  BMI 29.95 kg/m2  SpO2 93%  Vital signs normal    Physical Exam  Nursing note and vitals reviewed. Constitutional: He is oriented to person, place, and time. He appears well-developed and well-nourished.  Non-toxic appearance. He does not appear ill. No distress.  HENT:  Head: Normocephalic and atraumatic.  Right Ear: External ear normal.  Left Ear: External ear normal.  Nose: Nose normal. No mucosal edema or rhinorrhea.  Mouth/Throat: Oropharynx is clear and moist and mucous membranes are normal. No dental abscesses or uvula swelling.  Eyes: Conjunctivae normal and EOM are normal. Pupils are equal, round, and reactive to light.  Neck: Normal range of motion and full passive range of motion without  pain. Neck supple.  Cardiovascular: Normal rate, regular rhythm and normal heart sounds.  Exam reveals no gallop and no friction rub.   No murmur heard. Pulmonary/Chest: Effort normal. No respiratory distress. He has wheezes. He has no rhonchi. He has no rales. He exhibits no tenderness and no crepitus.       Patient coughing deeply and has rattling but no sputum production. He started to have diffuse wheezing and rhonchi.  Abdominal: Soft. Normal appearance and bowel sounds are normal. He exhibits no distension. There is no tenderness. There is no rebound and no guarding.  Musculoskeletal: Normal range of motion. He exhibits no edema and no tenderness.       Moves all extremities well.   Neurological: He is alert and oriented to person, place, and time. He has normal strength. No cranial nerve deficit.  Skin: Skin is warm, dry and intact.  No rash noted. No erythema. No pallor.  Psychiatric: He has a normal mood and affect. His speech is normal and behavior is normal. His mood appears not anxious.    ED Course  Procedures (including critical care time)   Medications  levofloxacin (LEVAQUIN) IVPB 750 mg (750 mg Intravenous New Bag/Given 08/31/12 1057)  albuterol (PROVENTIL) (5 MG/ML) 0.5% nebulizer solution 5 mg (5 mg Nebulization Given 08/31/12 0934)  ipratropium (ATROVENT) nebulizer solution 0.5 mg (0.5 mg Nebulization Given 08/31/12 0934)  sodium chloride 0.9 % bolus 1,000 mL (0 mL Intravenous Stopped 08/31/12 1223)  albuterol (PROVENTIL) (5 MG/ML) 0.5% nebulizer solution 5 mg (5 mg Nebulization Given 08/31/12 1126)  ipratropium (ATROVENT) nebulizer solution 0.5 mg (0.5 mg Nebulization Given 08/31/12 1126)  methylPREDNISolone sodium succinate (SOLU-MEDROL) 125 mg/2 mL injection 125 mg (125 mg Intravenous Given 08/31/12 1057)   10:30 am recheck after first nebulizer. Patient has improved air movement still has some scattered wheezing and rhonchi. We'll repeat his nebulizer treatment. His x-ray shows persistent right-sided pneumonia. He was started on IV Levaquin and given IV Solu-Medrol.  Patient ambulated and his pulse ox did not get lower than 94% on room air. They report he did well.  Recheck at 12:30 PM patient lying comfortably in bed. His lungs are improved. Patient has [place] subway getting a sandwich which he has eaten. We discussed going home. Patient concerned that "he has the germs at home",. At this point however he does not meet criteria for admission. He is not hypoxic he is responding to the nebulizer treatments and hopefully changing his antibiotics will help his pneumonia improved quicker.  Results for orders placed during the hospital encounter of 08/31/12  CBC WITH DIFFERENTIAL      Component Value Range   WBC 11.7 (*) 4.0 - 10.5 K/uL   RBC 4.98  4.22 - 5.81 MIL/uL   Hemoglobin 15.3  13.0 -  17.0 g/dL   HCT 52.8  41.3 - 24.4 %   MCV 89.4  78.0 - 100.0 fL   MCH 30.7  26.0 - 34.0 pg   MCHC 34.4  30.0 - 36.0 g/dL   RDW 01.0  27.2 - 53.6 %   Platelets 309  150 - 400 K/uL   Neutrophils Relative 67  43 - 77 %   Lymphocytes Relative 22  12 - 46 %   Monocytes Relative 10  3 - 12 %   Eosinophils Relative 0  0 - 5 %   Basophils Relative 1  0 - 1 %   Neutro Abs 7.8 (*) 1.7 - 7.7 K/uL  Lymphs Abs 2.6  0.7 - 4.0 K/uL   Monocytes Absolute 1.2 (*) 0.1 - 1.0 K/uL   Eosinophils Absolute 0.0  0.0 - 0.7 K/uL   Basophils Absolute 0.1  0.0 - 0.1 K/uL   WBC Morphology ATYPICAL LYMPHOCYTES    COMPREHENSIVE METABOLIC PANEL      Component Value Range   Sodium 136  135 - 145 mEq/L   Potassium 3.9  3.5 - 5.1 mEq/L   Chloride 99  96 - 112 mEq/L   CO2 28  19 - 32 mEq/L   Glucose, Bld 100 (*) 70 - 99 mg/dL   BUN 14  6 - 23 mg/dL   Creatinine, Ser 1.61  0.50 - 1.35 mg/dL   Calcium 9.2  8.4 - 09.6 mg/dL   Total Protein 6.8  6.0 - 8.3 g/dL   Albumin 3.2 (*) 3.5 - 5.2 g/dL   AST 29  0 - 37 U/L   ALT 24  0 - 53 U/L   Alkaline Phosphatase 71  39 - 117 U/L   Total Bilirubin 0.2 (*) 0.3 - 1.2 mg/dL   GFR calc non Af Amer >90  >90 mL/min   GFR calc Af Amer >90  >90 mL/min   Laboratory interpretation all normal except mild leukocytosis   Dg Chest 2 View  08/31/2012  *RADIOLOGY REPORT*  Clinical Data: Cough, pneumonia, smoker  CHEST - 2 VIEW  Comparison: 08/28/2012  Findings: Borderline enlargement of cardiac silhouette. Mediastinal contours and pulmonary vascularity normal. Chronic bronchitic changes. Persistent right lower lobe infiltrate consistent with pneumonia. Remaining lungs grossly clear. No pleural effusion or pneumothorax.  IMPRESSION: Persistent right lower lobe pneumonia. Chronic bronchitic changes.   Original Report Authenticated By: Ulyses Southward, M.D.    Dg Chest 2 View  08/28/2012  *RADIOLOGY REPORT*  Clinical Data: Cough, fever  CHEST - 2 VIEW  Comparison: 12/26/2007  Findings:  Cardiomediastinal silhouette is stable.  There is streaky hazy airspace disease in the right base highly suspicious for infiltrate/pneumonia.  Follow-up to resolution after appropriate treatment is recommended.  No pulmonary edema.  Bony thorax is stable.  IMPRESSION: Streaky hazy airspace disease in the right base highly suspicious for infiltrate/pneumonia.  Follow-up to resolution after treatment is recommended.   Original Report Authenticated By: Natasha Mead, M.D.     1. Community acquired pneumonia    New Prescriptions   LEVOFLOXACIN (LEVAQUIN) 750 MG TABLET    Take one po daily, start TOMORROW on Saturday.   PREDNISONE (DELTASONE) 20 MG TABLET    Take 3 po QD x 2d starting tomorrow, then 2 po QD x 3d then 1 po QD x 3d   albuterol (PROVENTIL HFA;VENTOLIN HFA) 108 (90 BASE) MCG/ACT inhaler 2 puff (not administered)  aerochamber Z-Stat Plus/medium 1 each (not administered)    Plan discharge  Devoria Albe, MD, Armando Gang   MDM          Ward Givens, MD 08/31/12 1256

## 2012-08-31 NOTE — ED Notes (Signed)
Bed:WA22<BR> Expected date:<BR> Expected time:<BR> Means of arrival:<BR> Comments:<BR> Triage 

## 2012-08-31 NOTE — ED Notes (Signed)
MD at bedside.knapp 

## 2012-08-31 NOTE — ED Notes (Addendum)
Patient reports that he was diagnosed with the flu here on Monday and hasn't improved with meds.  Patient reports unproductive cough and cold chills.

## 2012-08-31 NOTE — Progress Notes (Signed)
Pt listed with no insurance coverage CM and [facility] liaison spoke with pt.  Pt offered services to assist with finding a [place] self pay provider & health reform information 

## 2012-09-03 ENCOUNTER — Emergency Department (HOSPITAL_COMMUNITY)
Admission: EM | Admit: 2012-09-03 | Discharge: 2012-09-03 | Disposition: A | Discharge status: Home / Self Care | Payer: Self-pay | Attending: Emergency Medicine | Admitting: Emergency Medicine

## 2012-09-03 ENCOUNTER — Encounter (HOSPITAL_COMMUNITY): Payer: Self-pay | Admitting: *Deleted

## 2012-09-03 DIAGNOSIS — R05 Cough: Secondary | ICD-10-CM | POA: Insufficient documentation

## 2012-09-03 DIAGNOSIS — R0602 Shortness of breath: Secondary | ICD-10-CM | POA: Insufficient documentation

## 2012-09-03 DIAGNOSIS — J189 Pneumonia, unspecified organism: Secondary | ICD-10-CM | POA: Insufficient documentation

## 2012-09-03 DIAGNOSIS — R5381 Other malaise: Secondary | ICD-10-CM | POA: Insufficient documentation

## 2012-09-03 DIAGNOSIS — F17200 Nicotine dependence, unspecified, uncomplicated: Secondary | ICD-10-CM | POA: Insufficient documentation

## 2012-09-03 DIAGNOSIS — R059 Cough, unspecified: Secondary | ICD-10-CM | POA: Insufficient documentation

## 2012-09-03 DIAGNOSIS — R079 Chest pain, unspecified: Secondary | ICD-10-CM | POA: Insufficient documentation

## 2012-09-03 DIAGNOSIS — R062 Wheezing: Secondary | ICD-10-CM | POA: Insufficient documentation

## 2012-09-03 DIAGNOSIS — Z79899 Other long term (current) drug therapy: Secondary | ICD-10-CM | POA: Insufficient documentation

## 2012-09-03 MED ORDER — LEVOFLOXACIN 750 MG PO TABS: 750.0000 mg | ORAL_TABLET | Freq: Every day | ORAL | Status: DC

## 2012-09-03 MED ORDER — LEVOFLOXACIN IN D5W 500 MG/100ML IV SOLN
500.0000 mg | Freq: Once | INTRAVENOUS | Status: AC
  Administered 2012-09-03: 500 mg via INTRAVENOUS
  Filled 2012-09-03: qty 100

## 2012-09-03 MED ORDER — ALBUTEROL SULFATE HFA 108 (90 BASE) MCG/ACT IN AERS
2.0000 | INHALATION_SPRAY | RESPIRATORY_TRACT | Status: DC | PRN
  Administered 2012-09-03: 2 via RESPIRATORY_TRACT
  Filled 2012-09-03: qty 6.7

## 2012-09-03 MED ORDER — ALBUTEROL SULFATE HFA 108 (90 BASE) MCG/ACT IN AERS: 1.0000 | INHALATION_SPRAY | Freq: Four times a day (QID) | RESPIRATORY_TRACT | Status: DC | PRN

## 2012-09-03 MED ORDER — ALBUTEROL SULFATE (5 MG/ML) 0.5% IN NEBU
5.0000 mg | INHALATION_SOLUTION | Freq: Once | RESPIRATORY_TRACT | Status: AC
  Administered 2012-09-03: 5 mg via RESPIRATORY_TRACT
  Filled 2012-09-03: qty 1

## 2012-09-03 NOTE — ED Notes (Signed)
Pt was seen on 12/17 and was diagnosed with Pneumonia; pt returned on 12/20 for continuing of symptoms and pt returns this am with reports of Cough, congestion, general malaise; low grade fever and states "I just don't feel good and I am not getting any better"

## 2012-09-03 NOTE — Progress Notes (Signed)
MATCH - Medication Assistance  Card  Name: ___[name]_______  ID (MRN):___ [id number]________ Johnney Killian: [id number]                        RX Group:  [id number] PCN: [id number]   Discharge Date: ___12/23/13______ Expiration Date: _12/30/13____ (must be filled within 7 days of discharge)     Dear __[name]_________   Bonita Quin have been approved to have your discharge prescriptions filled through our  [facility] (Medication Assistance Through [facility]) program.  This program allows for a one time (no refills)--34 day supply of selected medications for a low co-pay amount.  Your co-pay is explained below:  Each co-pay is $3.00 per prescription.  For instance, if you have one prescription you will pay $3.00, two prescriptions you pay $6.00, three prescriptions you pay 9.00 and so on.   Only certain pharmacies are participating in this program with [facility].  You will need to select one of the pharmacies from the attached list, take your prescriptions, this letter and your photo ID to one of these participating pharmacies.    We are excited that you are able to use the [facility] program to get your medications.  These prescriptions must be filled within 7 days of hospital discharge or they will no longer be valid for the [facility] program.  Should you have any problems with your prescriptions please contact your case management team member at [phone]   Thank you,   [place], [facility]              [facility] [address] [place], [place]  [facility] [address] [place], [place]  [facility] [address] [place], [place]    [facility]         [address], [place], [place]  [address], [place], [place]  [address] [place], [place], [place]  [address] [place], [place]  [address], [place], [place] [address]  [place] [address] [place], [place]  [address], [place], [place]   [address] [place], [place]   [address], [place], [place]   [address], [place], [place]  [facility]        [address] [place] [address] [place], [place]   [address] [place], [place], [place] [address] [place], [place], [place] [address], [place], [place] [address], [place], [place] [address], [place], [place]         [facility]  [address], [place], [place] [address], [place], [place] [address] [employer], [place], [place] [address], [place], [place] [address] [employer], [place], [place] [address], [place], [place] [address] [place], [place] [address] [place], [place] [address] [place], [place], [place] [address], [place], [place] [address], [place], [place] [address], [place], [place] [address], [place], [place] [address], [place], [place]          Independent Pharmacies  [employer], [address], [place], [place] [address], [address] [place], [place] [facility], [address], [place], [place]     For continued medication needs, please contact the [facility] Department  [phone]).

## 2012-09-03 NOTE — ED Provider Notes (Signed)
History     CSN: [id number]  Arrival date & time 09/03/12  4098   First MD Initiated Contact with Patient 09/03/12 [phone]      Chief Complaint  Patient presents with  . Pneumonia    (Consider location/radiation/quality/duration/timing/severity/associated sxs/prior treatment) Patient is a 56 y.o. male presenting with pneumonia. The history is provided by the patient.  Pneumonia Associated symptoms include chest pain and shortness of breath. Pertinent negatives include no abdominal pain and no headaches.   patient has a diagnosis of pneumonia from just over a week ago. He was given azithromycin. He returned history not gotten any better and was given an IV dose of Levaquin and a prescription for Levaquin. Patient that he was unable to get the antibiotic filled due to the cost. He states that he has not gotten worse, just has not gotten better. No fevers now. No cough. Mild right sided chest pain. He is a smoker. He states that he does not have any chronic lung problems.    History reviewed. No pertinent past medical history.  History reviewed. No pertinent past surgical history.  Family History  Problem Relation Age of Onset  . Heart attack Father   . Heart attack Sister     History  Substance Use Topics  . Smoking status: Current Every Day Smoker    Types: Cigarettes  . Smokeless tobacco: Not on file  . Alcohol Use: Yes      Review of Systems  Constitutional: Positive for fatigue. Negative for fever, activity change and appetite change.  HENT: Negative for neck stiffness.   Eyes: Negative for pain.  Respiratory: Positive for cough and shortness of breath. Negative for chest tightness.   Cardiovascular: Positive for chest pain. Negative for leg swelling.  Gastrointestinal: Negative for nausea, vomiting, abdominal pain and diarrhea.  Genitourinary: Negative for flank pain.  Musculoskeletal: Negative for back pain.  Skin: Negative for rash.  Neurological: Negative for  weakness, numbness and headaches.  Psychiatric/Behavioral: Negative for behavioral problems.    Allergies  Review of patient's allergies indicates no known allergies.  Home Medications   Current Outpatient Rx  Name  Route  Sig  Dispense  Refill  . ACETAMINOPHEN 500 MG PO TABS   Oral   Take 1,000 mg by mouth every 6 (six) hours as needed. For pain         . ALBUTEROL SULFATE HFA 108 (90 BASE) MCG/ACT IN AERS   Inhalation   Inhale 2 puffs into the lungs every 6 (six) hours as needed. For shortness of breath or wheezing         . ALBUTEROL SULFATE HFA 108 (90 BASE) MCG/ACT IN AERS   Inhalation   Inhale 1-2 puffs into the lungs every 6 (six) hours as needed for wheezing.   1 Inhaler   0   . LEVOFLOXACIN 750 MG PO TABS      Take one po daily, start TOMORROW on Saturday.   7 tablet   0   . LEVOFLOXACIN 750 MG PO TABS   Oral   Take 1 tablet (750 mg total) by mouth daily.   5 tablet   0     BP 139/95  Pulse 86  Temp 99.1 F (37.3 C) (Oral)  Resp 20  SpO2 97%  Physical Exam  Nursing note and vitals reviewed. Constitutional: He is oriented to person, place, and time. He appears well-developed and well-nourished.  HENT:  Head: Normocephalic and atraumatic.  Eyes: EOM are normal. Pupils are  equal, round, and reactive to light.  Neck: Normal range of motion. Neck supple.  Cardiovascular: Normal rate, regular rhythm and normal heart sounds.   No murmur heard. Pulmonary/Chest: Effort normal. No respiratory distress. He has wheezes.       Wheezes and some rales to right base.   Abdominal: Soft. Bowel sounds are normal. He exhibits no distension and no mass. There is no tenderness. There is no rebound and no guarding.  Musculoskeletal: Normal range of motion. He exhibits no edema.  Neurological: He is alert and oriented to person, place, and time. No cranial nerve deficit.  Skin: Skin is warm and dry.  Psychiatric: He has a normal mood and affect.    ED Course   Procedures (including critical care time)  Labs Reviewed - No data to display No results found.   1. Community acquired pneumonia       MDM  Patient presents with continued shortness of breath. He was recently diagnosed with pneumonia. He came back to the ER and received a prescription for Levaquin, however he was unable to fill it but to financial reasons. Patient is able ambulate without hypoxia. He is not febrile. He is not hypotensive. He has some mild wheezing. Patient was seen by case management and was set up for the 3.prescription program to the outpatient pharmacy. He'll not fill prescriptions and will be discharged home. Does not appear to need admission at this time.        Juliet Rude. Rubin Payor, MD 09/03/12 [phone]

## 2012-09-03 NOTE — Progress Notes (Signed)
[facility] ED CM contacted by ED [place] for medication assistance CM spoke with pt who agrees to [facility] program ($3 for each Rx provided) Pt agrees to [facility] [employer] [facility]. Cm spoke with an Evonne at [phone] to assist pt with recalling the name of the Rx filled on last week She confirms pt did not get inhaler nor prednisone.  Pt confirms he was seen by [facility] liaison on 08/31/12 Pt states that his d/c instructions including information given on self pay pcps ans health reform are at [facility] home . States [facility]onne [facility] [employer] [facility] to [facility] but one cost $100+. CM updated EDP, [place] CM explained to pt the details of the [facility] one time assist program He agrees to go to [facility] outpatient pharmacy for medications on 09/03/12

## 2012-09-03 NOTE — Progress Notes (Signed)
Pt given MATCH letter, encouraged to make an appointment with [facility] and to complete health reform information independently. Cm signing off

## 2012-09-03 NOTE — ED Notes (Signed)
Patient ambulated approximately 140 feet on room air oxygen saturation level maintained between 97-98 %. Patient denied any shortness of breath during ambulation.

## 2012-12-28 ENCOUNTER — Encounter (HOSPITAL_COMMUNITY): Payer: Self-pay | Admitting: Emergency Medicine

## 2012-12-28 ENCOUNTER — Emergency Department (HOSPITAL_COMMUNITY)
Admission: EM | Admit: 2012-12-28 | Discharge: 2012-12-28 | Disposition: A | Discharge status: Home / Self Care | Payer: Self-pay | Attending: Emergency Medicine | Admitting: Emergency Medicine

## 2012-12-28 DIAGNOSIS — K0889 Other specified disorders of teeth and supporting structures: Secondary | ICD-10-CM

## 2012-12-28 DIAGNOSIS — F17200 Nicotine dependence, unspecified, uncomplicated: Secondary | ICD-10-CM | POA: Insufficient documentation

## 2012-12-28 DIAGNOSIS — Z79899 Other long term (current) drug therapy: Secondary | ICD-10-CM | POA: Insufficient documentation

## 2012-12-28 DIAGNOSIS — K089 Disorder of teeth and supporting structures, unspecified: Secondary | ICD-10-CM | POA: Insufficient documentation

## 2012-12-28 MED ORDER — AMOXICILLIN 500 MG PO CAPS: 500.0000 mg | ORAL_CAPSULE | Freq: Three times a day (TID) | ORAL | Status: DC

## 2012-12-28 MED ORDER — ACETAMINOPHEN 500 MG PO TABS
1000.0000 mg | ORAL_TABLET | Freq: Once | ORAL | Status: AC
  Administered 2012-12-28: 1000 mg via ORAL
  Filled 2012-12-28: qty 2

## 2012-12-28 MED ORDER — IBUPROFEN 600 MG PO TABS: 600.0000 mg | ORAL_TABLET | Freq: Four times a day (QID) | ORAL | Status: DC | PRN

## 2012-12-28 NOTE — ED Provider Notes (Signed)
History    This chart was scribed for non-physician practitioner, Junius Finner, PA-C, working with Suzi Roots, MD by Melba Coon, ED Scribe. This patient was seen in room WTR8/WTR8 and the patient's care was started at 4:31PMPM.   CSN: [id number]  Arrival date & time 12/28/12  1547   None     Chief Complaint  Patient presents with  . Dental Pain    2 day hx of dental pain on l/lower mouth    (Consider location/radiation/quality/duration/timing/severity/associated sxs/prior treatment) The history is provided by the patient. No language interpreter was used.   Lucas Stark is a 57 y.o. male who presents to the Emergency Department complaining of constant, moderate to severe left lower dental pain with an onset 2 days ago. He reports an abscess to his tooth without drainage. OTC Tylenol has not alleviated the pain; he took 2 doses today. Chewing aggravates the pain. He does not have a regular dentist but has been referred to one of the on call dentist here at the ED in the past. Denies fever and dysphagia. Denies neck pain, sore throat, rash, back pain, CP, SOB, abdominal pain, nausea, emesis, diarrhea, dysuria, or extremity pain, edema, weakness, numbness, or tingling. No known allergies. No other pertinent medical symptoms. He is a current everyday smoker for the past 30 years; 1/2 pack a day; he denies using chewing tobacco.  History reviewed. No pertinent past medical history.  History reviewed. No pertinent past surgical history.  Family History  Problem Relation Age of Onset  . Heart attack Father   . Heart attack Sister     History  Substance Use Topics  . Smoking status: Current Every Day Smoker    Types: Cigarettes  . Smokeless tobacco: Not on file  . Alcohol Use: Yes      Review of Systems 10 Systems reviewed and all are negative for acute change except as noted in the HPI.   Allergies  Review of patient's allergies indicates no known allergies.  Home  Medications   Current Outpatient Rx  Name  Route  Sig  Dispense  Refill  . acetaminophen (TYLENOL) 500 MG tablet   Oral   Take 1,000 mg by mouth every 6 (six) hours as needed. For pain         . albuterol (PROVENTIL HFA;VENTOLIN HFA) 108 (90 BASE) MCG/ACT inhaler   Inhalation   Inhale 2 puffs into the lungs every 6 (six) hours as needed. For shortness of breath or wheezing         . amoxicillin (AMOXIL) 500 MG capsule   Oral   Take 1 capsule (500 mg total) by mouth 3 (three) times daily.   30 capsule   0   . ibuprofen (ADVIL,MOTRIN) 600 MG tablet   Oral   Take 1 tablet (600 mg total) by mouth every 6 (six) hours as needed for pain.   30 tablet   0     BP 121/65  Pulse 68  Temp(Src) 98.3 F (36.8 C) (Oral)  SpO2 96%  Physical Exam  Nursing note and vitals reviewed. Constitutional: He is oriented to person, place, and time. He appears well-developed and well-nourished. No distress.  HENT:  Head: Normocephalic and atraumatic. No trismus in the jaw.  Right Ear: External ear normal.  Left Ear: External ear normal.  Nose: Nose normal.  Mouth/Throat: Uvula is midline, oropharynx is clear and moist and mucous membranes are normal. Mucous membranes are not pale, not dry and not cyanotic.  He does not have dentures. No oral lesions. Abnormal dentition. Dental caries present. No dental abscesses, edematous or lacerations. No oropharyngeal exudate, posterior oropharyngeal edema, posterior oropharyngeal erythema or tonsillar abscesses.    Left buccal mucosa erythemic with 2 white pinpoint lesions.  Left cheek TTP.  No abscess or drainage.  Missing left lower molar.  Post-oropharynx clear, no peritonsillar abscess   Eyes: EOM are normal. Pupils are equal, round, and reactive to light. Right eye exhibits no discharge. Left eye exhibits no discharge.  Neck: Normal range of motion. Neck supple. No tracheal deviation present.  No nuchal rigidity no meningeal signs  Cardiovascular:  Normal rate and regular rhythm.   Pulmonary/Chest: Effort normal and breath sounds normal. No stridor. No respiratory distress. He has no wheezes. He has no rales.  Abdominal: Soft. He exhibits no distension and no mass. There is no tenderness. There is no rebound and no guarding.  Musculoskeletal: Normal range of motion. He exhibits no edema and no tenderness.  Neurological: He is alert and oriented to person, place, and time. He has normal reflexes. No cranial nerve deficit. Coordination normal.  Skin: Skin is warm and dry. No rash noted. He is not diaphoretic. No erythema. No pallor.  No pettechia no purpura  Psychiatric: He has a normal mood and affect. His behavior is normal.    ED Course  Procedures (including critical care time)  DIAGNOSTIC STUDIES: Oxygen Saturation is 96% on room air, adequate by my interpretation.    COORDINATION OF CARE:  4:34PM - Ibuprofen will be ordered for Mr Roe. Amoxicillin and Norco will be prescribed. He will be referred to a dentist and is advised to f/u with them. He is ready for d/c.   Labs Reviewed - No data to display No results found.   1. Pain, dental       MDM  Pt c/o 2 day hx of dental pain of left lower jaw.  Denies fever, n/v/d, trouble swallowing or breathing.  States it does hurt to chew on that side.  Has tried Tylenol today w/o relief.  Was referred in the past to a dentist but could not recall his name.  Willing to be referred again for f/u dental care.  Pt is a 1/2 pack per day smoker.  Denies use of chewing tobacco.   Mouth: erythremic left buccal mucosa with white specs without discharge.  TTP.  Missing lower lower molar.  No abscess seen.  Pharynx moist and clear, no peritonsillar abscess.  No labs or imaging needed at this time.   Rx: amox and norco.  Advised pt to call Dr. Mayford Knife, DDS within 24-48hrs for f/u appointment.  Mention he was seen in ED this evening.   I personally performed the services described in this  documentation, which was scribed in my presence. The recorded information has been reviewed and is accurate.        Junius Finner, PA-C 12/28/12 1701

## 2012-12-28 NOTE — ED Notes (Signed)
2 day hx of l/lower dental pain

## 2012-12-29 NOTE — ED Provider Notes (Signed)
Medical screening examination/treatment/procedure(s) were performed by non-physician practitioner and as supervising physician I was immediately available for consultation/collaboration.   Juhi Lagrange E Makaela Cando, MD 12/29/12 1206 

## 2013-08-17 ENCOUNTER — Encounter (HOSPITAL_COMMUNITY): Payer: Self-pay | Admitting: Emergency Medicine

## 2013-08-17 ENCOUNTER — Emergency Department (HOSPITAL_COMMUNITY)
Admission: EM | Admit: 2013-08-17 | Discharge: 2013-08-17 | Disposition: A | Discharge status: Home / Self Care | Payer: Self-pay | Attending: Emergency Medicine | Admitting: Emergency Medicine

## 2013-08-17 DIAGNOSIS — K032 Erosion of teeth: Secondary | ICD-10-CM | POA: Insufficient documentation

## 2013-08-17 DIAGNOSIS — Z79899 Other long term (current) drug therapy: Secondary | ICD-10-CM | POA: Insufficient documentation

## 2013-08-17 DIAGNOSIS — K047 Periapical abscess without sinus: Secondary | ICD-10-CM | POA: Insufficient documentation

## 2013-08-17 DIAGNOSIS — F17200 Nicotine dependence, unspecified, uncomplicated: Secondary | ICD-10-CM | POA: Insufficient documentation

## 2013-08-17 DIAGNOSIS — Z792 Long term (current) use of antibiotics: Secondary | ICD-10-CM | POA: Insufficient documentation

## 2013-08-17 MED ORDER — HYDROCODONE-ACETAMINOPHEN 5-325 MG PO TABS: 1.0000 | ORAL_TABLET | Freq: Four times a day (QID) | ORAL | Status: DC | PRN

## 2013-08-17 MED ORDER — IBUPROFEN 200 MG PO TABS
600.0000 mg | ORAL_TABLET | Freq: Once | ORAL | Status: AC
  Administered 2013-08-17: 600 mg via ORAL
  Filled 2013-08-17: qty 3

## 2013-08-17 MED ORDER — HYDROCODONE-ACETAMINOPHEN 5-325 MG PO TABS
1.0000 | ORAL_TABLET | Freq: Once | ORAL | Status: AC
  Administered 2013-08-17: 1 via ORAL
  Filled 2013-08-17: qty 1

## 2013-08-17 MED ORDER — PENICILLIN V POTASSIUM 500 MG PO TABS: 500.0000 mg | ORAL_TABLET | Freq: Four times a day (QID) | ORAL | Status: AC

## 2013-08-17 MED ORDER — IBUPROFEN 600 MG PO TABS: 600.0000 mg | ORAL_TABLET | Freq: Four times a day (QID) | ORAL | Status: DC | PRN

## 2013-08-17 NOTE — ED Notes (Signed)
Patient made aware that he is not to be driving because he just received narcotics. Stated that he had someone picking him up or he had a male friend that would be picking him up

## 2013-08-17 NOTE — ED Provider Notes (Signed)
CSN: [id number]     Arrival date & time 08/17/13  0708 History   First MD Initiated Contact with Patient 08/17/13 0730     Chief Complaint  Patient presents with  . Dental Pain   (Consider location/radiation/quality/duration/timing/severity/associated sxs/prior Treatment) HPI Comments: Pt with no medical hx comes in with cc of toothache. Pt has LUQ toothache x 3 days, constant, achy, no trismus, no n/v/f/c. Pt has no specific aggravating or relieving factors. No pus. Hx of dental extractions.  Patient is a 57 y.o. male presenting with tooth pain. The history is provided by the patient.  Dental Pain Associated symptoms: no drooling and no headaches     History reviewed. No pertinent past medical history. History reviewed. No pertinent past surgical history. Family History  Problem Relation Age of Onset  . Heart attack Father   . Heart attack Sister    History  Substance Use Topics  . Smoking status: Current Every Day Smoker    Types: Cigarettes  . Smokeless tobacco: Not on file  . Alcohol Use: Yes    Review of Systems  Constitutional: Negative for chills.  HENT: Positive for dental problem. Negative for drooling.   Gastrointestinal: Negative for nausea and vomiting.  Neurological: Negative for headaches.  Hematological: Does not bruise/bleed easily.    Allergies  Review of patient's allergies indicates no known allergies.  Home Medications   Current Outpatient Rx  Name  Route  Sig  Dispense  Refill  . acetaminophen (TYLENOL) 500 MG tablet   Oral   Take 1,000 mg by mouth every 6 (six) hours as needed. For pain         . albuterol (PROVENTIL HFA;VENTOLIN HFA) 108 (90 BASE) MCG/ACT inhaler   Inhalation   Inhale 2 puffs into the lungs every 6 (six) hours as needed. For shortness of breath or wheezing         . HYDROcodone-acetaminophen (NORCO/VICODIN) 5-325 MG per tablet   Oral   Take 1 tablet by mouth every 6 (six) hours as needed.   6 tablet   0   .  ibuprofen (ADVIL,MOTRIN) 600 MG tablet   Oral   Take 1 tablet (600 mg total) by mouth every 6 (six) hours as needed.   30 tablet   0   . penicillin v potassium (VEETID) 500 MG tablet   Oral   Take 1 tablet (500 mg total) by mouth 4 (four) times daily.   28 tablet   0    BP 133/84  Pulse 83  Temp(Src) 98.3 F (36.8 C) (Oral)  Resp 18  SpO2 96% Physical Exam  Nursing note and vitals reviewed. Constitutional: He appears well-developed.  HENT:  Head: Normocephalic.  Mouth/Throat:      ED Course  Procedures (including critical care time) Labs Review Labs Reviewed - No data to display Imaging Review No results found.  EKG Interpretation   None       MDM   1. Infected tooth    DDx includes: - Periapical tooth infection - Dental abscess - Gingivitis - Dental trauma - Pulpitis - Nerve root compression  Pt's tooth #4, 5 are tender to palpation. No abscess to drain in the ER. No ANUG/necrosis. Will give pen vk, dental f/u.  Derwood Kaplan, MD 08/17/13 [phone]

## 2013-08-17 NOTE — ED Notes (Signed)
Pt from home with R upper tooth pain x3 days. Pt is A&O and in NAD

## 2013-09-19 ENCOUNTER — Emergency Department (HOSPITAL_COMMUNITY)
Admission: EM | Admit: 2013-09-19 | Discharge: 2013-09-19 | Disposition: A | Discharge status: Home / Self Care | Payer: Self-pay | Attending: Emergency Medicine | Admitting: Emergency Medicine

## 2013-09-19 ENCOUNTER — Emergency Department (HOSPITAL_COMMUNITY): Payer: Self-pay

## 2013-09-19 ENCOUNTER — Encounter (HOSPITAL_COMMUNITY): Payer: Self-pay | Admitting: Emergency Medicine

## 2013-09-19 DIAGNOSIS — Y939 Activity, unspecified: Secondary | ICD-10-CM | POA: Insufficient documentation

## 2013-09-19 DIAGNOSIS — S46819A Strain of other muscles, fascia and tendons at shoulder and upper arm level, unspecified arm, initial encounter: Principal | ICD-10-CM

## 2013-09-19 DIAGNOSIS — F17200 Nicotine dependence, unspecified, uncomplicated: Secondary | ICD-10-CM | POA: Insufficient documentation

## 2013-09-19 DIAGNOSIS — Y929 Unspecified place or not applicable: Secondary | ICD-10-CM | POA: Insufficient documentation

## 2013-09-19 DIAGNOSIS — X58XXXA Exposure to other specified factors, initial encounter: Secondary | ICD-10-CM | POA: Insufficient documentation

## 2013-09-19 DIAGNOSIS — S46812A Strain of other muscles, fascia and tendons at shoulder and upper arm level, left arm, initial encounter: Secondary | ICD-10-CM

## 2013-09-19 DIAGNOSIS — S43499A Other sprain of unspecified shoulder joint, initial encounter: Secondary | ICD-10-CM | POA: Insufficient documentation

## 2013-09-19 MED ORDER — CYCLOBENZAPRINE HCL 10 MG PO TABS: 10.0000 mg | ORAL_TABLET | Freq: Two times a day (BID) | ORAL | Status: DC | PRN

## 2013-09-19 MED ORDER — MELOXICAM 7.5 MG PO TABS: 7.5000 mg | ORAL_TABLET | Freq: Every day | ORAL | Status: DC

## 2013-09-19 NOTE — ED Provider Notes (Signed)
CSN: [id number]     Arrival date & time 09/19/13  1124 History   This chart was scribed for non-physician practitioner, Wille Glaser, PA-C working with Virgel Manifold, MD by Einar Pheasant, ED scribe. This patient was seen in room WTR5/WTR5 and the patient's care was started at 11:59 AM.     Chief Complaint  Patient presents with  . Shoulder Pain  . Shortness of Breath    The history is provided by the patient. No language interpreter was used.   HPI Comments: Lucas Stark is a 58 y.o. male who presents to the Emergency Department complaining of constant worsening back pain that started 3-4 weeks ago. Pt states that his pain is exacerbated by movement. Pt is also complaining of associated shoulder pain. He states that he feels like he is loosing "the height of his arm" and raising his arm above his head causes him pain. He reports having his back massaged with mild relief.     History reviewed. No pertinent past medical history. History reviewed. No pertinent past surgical history. Family History  Problem Relation Age of Onset  . Heart attack Father   . Heart attack Sister    History  Substance Use Topics  . Smoking status: Current Every Day Smoker    Types: Cigarettes  . Smokeless tobacco: Not on file  . Alcohol Use: Yes    Review of Systems  Musculoskeletal: Positive for arthralgias (left shoulder pain) and back pain.  All other systems reviewed and are negative.    Allergies  Review of patient's allergies indicates no known allergies.  Home Medications  No current outpatient prescriptions on file.  BP 131/80  Pulse 65  Temp(Src) 98.4 F (36.9 C)  Resp 16  SpO2 98%  Physical Exam  Nursing note and vitals reviewed. Constitutional: He is oriented to person, place, and time. He appears well-developed and well-nourished. No distress.  HENT:  Head: Normocephalic and atraumatic.  Right Ear: External ear normal.  Left Ear: External ear normal.  Nose: Nose  normal.  Mouth/Throat: Oropharynx is clear and moist. No oropharyngeal exudate.  Eyes: Conjunctivae are normal. Pupils are equal, round, and reactive to light. No scleral icterus.  Neck: Normal range of motion. Neck supple. Muscular tenderness present. No spinous process tenderness present.    Pulmonary/Chest: Effort normal and breath sounds normal. No respiratory distress. He has no wheezes. He has no rales. He exhibits no tenderness.  Musculoskeletal:       Left shoulder: He exhibits tenderness. He exhibits normal range of motion, no bony tenderness, no deformity, no spasm, normal pulse and normal strength.       Arms: Lymphadenopathy:    He has no cervical adenopathy.  Neurological: He is alert and oriented to person, place, and time. He exhibits normal muscle tone. Coordination normal.  Skin: Skin is warm and dry. No rash noted. No erythema. No pallor.  Psychiatric: He has a normal mood and affect. His behavior is normal. Judgment and thought content normal.    ED Course  Procedures (including critical care time)  DIAGNOSTIC STUDIES: Oxygen Saturation is 98% on RA, normal by my interpretation.    COORDINATION OF CARE: 12:09 PM- Will order a Chest X-Ray and prescribe muscle relaxer medication. Pt advised of plan for treatment and pt agrees. Medications - No data to display   Labs Review Labs Reviewed - No data to display Imaging Review No results found.  EKG Interpretation   None      Results  for orders placed during the hospital encounter of 08/31/12  CBC WITH DIFFERENTIAL      Result Value Range   WBC 11.7 (*) 4.0 - 10.5 K/uL   RBC 4.98  4.22 - 5.81 MIL/uL   Hemoglobin 15.3  13.0 - 17.0 g/dL   HCT 44.5  39.0 - 52.0 %   MCV 89.4  78.0 - 100.0 fL   MCH 30.7  26.0 - 34.0 pg   MCHC 34.4  30.0 - 36.0 g/dL   RDW 13.2  11.5 - 15.5 %   Platelets 309  150 - 400 K/uL   Neutrophils Relative % 67  43 - 77 %   Lymphocytes Relative 22  12 - 46 %   Monocytes Relative 10  3 -  12 %   Eosinophils Relative 0  0 - 5 %   Basophils Relative 1  0 - 1 %   Neutro Abs 7.8 (*) 1.7 - 7.7 K/uL   Lymphs Abs 2.6  0.7 - 4.0 K/uL   Monocytes Absolute 1.2 (*) 0.1 - 1.0 K/uL   Eosinophils Absolute 0.0  0.0 - 0.7 K/uL   Basophils Absolute 0.1  0.0 - 0.1 K/uL   WBC Morphology ATYPICAL LYMPHOCYTES    COMPREHENSIVE METABOLIC PANEL      Result Value Range   Sodium 136  135 - 145 mEq/L   Potassium 3.9  3.5 - 5.1 mEq/L   Chloride 99  96 - 112 mEq/L   CO2 28  19 - 32 mEq/L   Glucose, Bld 100 (*) 70 - 99 mg/dL   BUN 14  6 - 23 mg/dL   Creatinine, Ser 0.79  0.50 - 1.35 mg/dL   Calcium 9.2  8.4 - 10.5 mg/dL   Total Protein 6.8  6.0 - 8.3 g/dL   Albumin 3.2 (*) 3.5 - 5.2 g/dL   AST 29  0 - 37 U/L   ALT 24  0 - 53 U/L   Alkaline Phosphatase 71  39 - 117 U/L   Total Bilirubin 0.2 (*) 0.3 - 1.2 mg/dL   GFR calc non Af Amer >90  >90 mL/min   GFR calc Af Amer >90  >90 mL/min   Dg Chest 2 View  09/19/2013   CLINICAL DATA:  Short of breath  EXAM: CHEST  2 VIEW  COMPARISON:  08/31/2012  FINDINGS: The heart size and mediastinal contours are within normal limits. Both lungs are clear. The visualized skeletal structures are unremarkable. Interval resolution of right lower lobe infiltrate. Bilateral nipple shadows.  IMPRESSION: No active cardiopulmonary disease.   Electronically Signed   By: Franchot Gallo M.D.   On: 09/19/2013 12:15       MDM  Trapezius muscle strain  Patient here with 3 week history of left trapezius muscle pain and radiation into left shoulder area, worse with movement, has been trying massage, lidocaine patches, muscle rub without relief, FROM, no signs of impingement, doubt shoulder involvement but could be tendonitis.  Will place on anti-inflammatories and muscle relaxers.  States that the pain "takes his breath away"  Currently denies chest pain, this could also be cervical radiculopathy but no midline tenderness and no alarming signs at this time.  I personally  performed the services described in this documentation, which was scribed in my presence. The recorded information has been reviewed and is accurate.     Idalia Needle Joelyn Oms, PA-C 09/19/13 1234

## 2013-09-19 NOTE — Discharge Instructions (Signed)
Muscle Strain Muscle strain occurs when a muscle is stretched beyond its normal length. A small number of muscle fibers generally are torn. This is especially common in athletes. This happens when a sudden, violent force placed on a muscle stretches it too far. Usually, recovery from muscle strain takes 1 to 2 weeks. Complete healing will take 5 to 6 weeks.  HOME CARE INSTRUCTIONS   While awake, apply ice to the sore muscle for the first 2 days after the injury.  Put ice in a plastic bag.  Place a towel between your skin and the bag.  Leave the ice on for 15-20 minutes each hour.  Do not use the strained muscle for several days, until you no longer have pain.  You may wrap the injured area with an elastic bandage for comfort. Be careful not to wrap it too tightly. This may interfere with blood circulation or increase swelling.  Only take over-the-counter or prescription medicines for pain, discomfort, or fever as directed by your caregiver. SEEK MEDICAL CARE IF:  You have increasing pain or swelling in the injured area. MAKE SURE YOU:   Understand these instructions.  Will watch your condition.  Will get help right away if you are not doing well or get worse. Document Released: 08/29/2005 Document Revised: 11/21/2011 Document Reviewed: 09/10/2011 [facility] Patient Information 2014 [place], [place].  Sprain A sprain is a tear in one of the strong, fibrous tissues that connect your bones (ligaments). The severity of the sprain depends on how much of the ligament is torn. The tear can be either partial or complete. CAUSES  Often, sprains are a result of a fall or an injury. The force of the impact causes the fibers of your ligament to stretch beyond their normal length. This excess tension causes the fibers of your ligament to tear. SYMPTOMS  You may have some loss of motion or increased pain within your normal range of motion. Other symptoms  include:  Bruising.  Tenderness.  Swelling. DIAGNOSIS  In order to diagnose a sprain, your caregiver will physically examine you to determine how torn the ligament is. Your caregiver may also suggest an X-ray exam to make sure no bones are broken. TREATMENT  If your ligament is only partially torn, treatment usually involves keeping the injured area in a fixed position (immobilization) for a short period. To do this, your caregiver will apply a bandage, cast, or splint to keep the area from moving until it heals. For a partially torn ligament, the healing process usually takes 2 to 3 weeks. If your ligament is completely torn, you may need surgery to reconnect the ligament to the bone or to reconstruct the ligament. After surgery, a cast or splint may be applied and will need to stay on for 4 to 6 weeks while your ligament heals. HOME CARE INSTRUCTIONS  Keep the injured area elevated to decrease swelling.  To ease pain and swelling, apply ice to your joint twice a day, for 2 to 3 days.  Put ice in a plastic bag.  Place a towel between your skin and the bag.  Leave the ice on for 15 minutes.  Only take over-the-counter or prescription medicine for pain as directed by your caregiver.  Do not leave the injured area unprotected until pain and stiffness go away (usually 3 to 4 weeks).  Do not allow your cast or splint to get wet. Cover your cast or splint with a plastic bag when you shower or bathe. Do not  swim.  Your caregiver may suggest exercises for you to do during your recovery to prevent or limit permanent stiffness. SEEK IMMEDIATE MEDICAL CARE IF:  Your cast or splint becomes damaged.  Your pain becomes worse. MAKE SURE YOU:  Understand these instructions.  Will watch your condition.  Will get help right away if you are not doing well or get worse. Document Released: 08/26/2000 Document Revised: 11/21/2011 Document Reviewed: 09/10/2011 [facility] Patient Information  2014 [place], [place].

## 2013-09-19 NOTE — ED Notes (Addendum)
C/o pain in left shoulder blade to back and up into neck, states also SOB, states hurting for 3 weeks, SOB x 2 weeks, states had chest pain, but stopped, states not comfortable and can't sleep

## 2013-09-19 NOTE — Progress Notes (Signed)
[facility] CL provided pt with a [employer] application. Patient stated that he had an apt at [facility] for eligibility and enrollment with the program, but could not remember what day in February the apt was. CL contacted Family Medicine at [facility] and was told that patient had an apt on 2/5. Provided pt with appointment information.

## 2013-09-20 NOTE — ED Provider Notes (Signed)
Medical screening examination/treatment/procedure(s) were performed by non-physician practitioner and as supervising physician I was immediately available for consultation/collaboration.  EKG Interpretation   None        Virgel Manifold, MD 09/20/13 [phone]

## 2014-07-11 ENCOUNTER — Emergency Department (HOSPITAL_COMMUNITY): Payer: Self-pay

## 2014-07-11 ENCOUNTER — Encounter (HOSPITAL_COMMUNITY): Payer: Self-pay | Admitting: Emergency Medicine

## 2014-07-11 ENCOUNTER — Emergency Department (HOSPITAL_COMMUNITY)
Admission: EM | Admit: 2014-07-11 | Discharge: 2014-07-12 | Disposition: A | Discharge status: Home / Self Care | Payer: Self-pay | Attending: Emergency Medicine | Admitting: Emergency Medicine

## 2014-07-11 DIAGNOSIS — R079 Chest pain, unspecified: Secondary | ICD-10-CM

## 2014-07-11 DIAGNOSIS — Z720 Tobacco use: Secondary | ICD-10-CM | POA: Insufficient documentation

## 2014-07-11 DIAGNOSIS — R51 Headache: Secondary | ICD-10-CM | POA: Insufficient documentation

## 2014-07-11 DIAGNOSIS — R17 Unspecified jaundice: Secondary | ICD-10-CM | POA: Insufficient documentation

## 2014-07-11 DIAGNOSIS — R0789 Other chest pain: Secondary | ICD-10-CM

## 2014-07-11 DIAGNOSIS — M549 Dorsalgia, unspecified: Secondary | ICD-10-CM | POA: Insufficient documentation

## 2014-07-11 DIAGNOSIS — R0602 Shortness of breath: Secondary | ICD-10-CM | POA: Insufficient documentation

## 2014-07-11 DIAGNOSIS — R05 Cough: Secondary | ICD-10-CM | POA: Insufficient documentation

## 2014-07-11 LAB — CBC
HEMATOCRIT: 47.3 % (ref 39.0–52.0)
HEMOGLOBIN: 16.7 g/dL (ref 13.0–17.0)
MCH: 32.2 pg (ref 26.0–34.0)
MCHC: 35.3 g/dL (ref 30.0–36.0)
MCV: 91.3 fL (ref 78.0–100.0)
Platelets: 209 10*3/uL (ref 150–400)
RBC: 5.18 MIL/uL (ref 4.22–5.81)
RDW: 13.4 % (ref 11.5–15.5)
WBC: 6.6 10*3/uL (ref 4.0–10.5)

## 2014-07-11 LAB — I-STAT TROPONIN, ED: TROPONIN I, POC: 0.02 ng/mL (ref 0.00–0.08)

## 2014-07-11 NOTE — ED Notes (Signed)
Patient c/o left chest pain, onset last night, states it is in his ribs and radiates to his back. Patient denies medical hx, is a smoker, family hx of MI (sister age 56, deceased). Patient states he took tums at 0530 today without change in symptoms.

## 2014-07-11 NOTE — ED Notes (Signed)
Bed: WTR9 Expected date:  Expected time:  Means of arrival:  Comments: Close per Coralyn Mark

## 2014-07-12 LAB — BASIC METABOLIC PANEL
Anion gap: 16 — ABNORMAL HIGH (ref 5–15)
BUN: 15 mg/dL (ref 6–23)
CALCIUM: 9.1 mg/dL (ref 8.4–10.5)
CO2: 24 mEq/L (ref 19–32)
CREATININE: 0.97 mg/dL (ref 0.50–1.35)
Chloride: 102 mEq/L (ref 96–112)
GFR calc Af Amer: >90 mL/min (ref >90)
GLUCOSE: 95 mg/dL (ref 70–99)
Potassium: 4 mEq/L (ref 3.7–5.3)
Sodium: 142 mEq/L (ref 137–147)

## 2014-07-12 LAB — TROPONIN I

## 2014-07-12 LAB — D-DIMER, QUANTITATIVE (NOT AT ARMC)

## 2014-07-12 MED ORDER — NAPROXEN 500 MG PO TABS: 500.0000 mg | ORAL_TABLET | Freq: Two times a day (BID) | ORAL | Status: DC

## 2014-07-12 MED ORDER — MORPHINE SULFATE 4 MG/ML IJ SOLN
4.0000 mg | Freq: Once | INTRAMUSCULAR | Status: AC
  Administered 2014-07-12: 4 mg via INTRAMUSCULAR

## 2014-07-12 MED ORDER — KETOROLAC TROMETHAMINE 30 MG/ML IJ SOLN
30.0000 mg | Freq: Once | INTRAMUSCULAR | Status: AC
  Administered 2014-07-12: 30 mg via INTRAMUSCULAR
  Filled 2014-07-12: qty 1

## 2014-07-12 MED ORDER — MORPHINE SULFATE 4 MG/ML IJ SOLN
4.0000 mg | Freq: Once | INTRAMUSCULAR | Status: DC
  Filled 2014-07-12: qty 1

## 2014-07-12 MED ORDER — ASPIRIN 81 MG PO CHEW
324.0000 mg | CHEWABLE_TABLET | Freq: Once | ORAL | Status: AC
  Administered 2014-07-12: 324 mg via ORAL
  Filled 2014-07-12: qty 4

## 2014-07-12 NOTE — ED Provider Notes (Signed)
CSN: [id number]     Arrival date & time 07/11/14  2238 History   First MD Initiated Contact with Patient 07/11/14 2343     Chief Complaint  Patient presents with  . Chest Pain    left sided, radiating to back, onset last night   Patient is a 58 y.o. male presenting with chest pain. The history is provided by the patient. No language interpreter was used.  Chest Pain Pain location:  L chest Pain quality: sharp and stabbing   Pain radiates to:  Mid back Pain radiates to the back: yes   Pain severity:  Severe Onset quality:  Sudden Duration:  1 day Timing:  Intermittent Progression:  Worsening Chronicity:  New Context: breathing, movement, raising an arm and at rest   Context: no drug use, not eating, no intercourse, not lifting, no stress and no trauma   Relieved by:  Nothing Worsened by:  Movement, exertion, smoking and deep breathing Ineffective treatments:  Certain positions and antacids Associated symptoms: back pain, cough, headache and shortness of breath   Associated symptoms: no abdominal pain, no altered mental status, no anorexia, no anxiety, no claudication, no diaphoresis, no dizziness, no fatigue, no fever, no heartburn, no lower extremity edema, no nausea, no near-syncope, no numbness, no orthopnea, no palpitations, no PND, no syncope, not vomiting and no weakness   Risk factors: male sex and smoking   Risk factors: no aortic disease, no coronary artery disease, no diabetes mellitus, no high cholesterol, no hypertension, no immobilization, not obese, no prior DVT/PE and no surgery    Family hx of MI in both sister and father.  Sister had an MI in her 9s.  Patient is not followed by PCP.    History reviewed. No pertinent past medical history. History reviewed. No pertinent past surgical history. Family History  Problem Relation Age of Onset  . Heart attack Father   . Heart attack Sister    History  Substance Use Topics  . Smoking status: Current Every Day Smoker  -- 0.50 packs/day    Types: Cigarettes  . Smokeless tobacco: Not on file  . Alcohol Use: Yes     Comment: 1/2 pint liquor daily    Review of Systems  Constitutional: Negative for fever, diaphoresis and fatigue.  Respiratory: Positive for cough and shortness of breath.   Cardiovascular: Positive for chest pain. Negative for palpitations, orthopnea, claudication, syncope, PND and near-syncope.  Gastrointestinal: Negative for heartburn, nausea, vomiting, abdominal pain and anorexia.  Musculoskeletal: Positive for back pain.  Neurological: Positive for headaches. Negative for dizziness, weakness and numbness.  All other systems reviewed and are negative.     Allergies  Review of patient's allergies indicates no known allergies.  Home Medications   Prior to Admission medications   Not on File   BP 152/93  Pulse 60  Temp(Src) 97.9 F (36.6 C) (Oral)  Resp 18  Ht 5\' 5"  (1.651 m)  Wt 175 lb (79.379 kg)  BMI 29.12 kg/m2  SpO2 97% Physical Exam  Nursing note and vitals reviewed. Constitutional: He is oriented to person, place, and time. He appears well-developed and well-nourished. No distress.  HENT:  Head: Normocephalic and atraumatic.  Mouth/Throat: Oropharynx is clear and moist. No oropharyngeal exudate.  Eyes: Conjunctivae and EOM are normal. Pupils are equal, round, and reactive to light. Scleral icterus is present.  Neck: Normal range of motion. Neck supple. No JVD present. No thyromegaly present.  Cardiovascular: Normal rate, regular rhythm, normal heart sounds and  intact distal pulses.  Exam reveals no gallop and no friction rub.   No murmur heard. Pulmonary/Chest: Effort normal and breath sounds normal. No respiratory distress. He has no wheezes. He has no rales. He exhibits tenderness (left sided chest tenderness).  Abdominal: Soft. Bowel sounds are normal. He exhibits no distension and no mass. There is no tenderness. There is no rebound and no guarding.   Musculoskeletal: Normal range of motion.  Lymphadenopathy:    He has no cervical adenopathy.  Neurological: He is alert and oriented to person, place, and time. He has normal strength. No cranial nerve deficit or sensory deficit. Coordination normal.  Skin: Skin is warm and dry. He is not diaphoretic.  Psychiatric: He has a normal mood and affect. His behavior is normal. Judgment and thought content normal.    ED Course  Procedures (including critical care time) Labs Review Labs Reviewed  BASIC METABOLIC PANEL - Abnormal; Notable for the following:    Anion gap 16 (*)    All other components within normal limits  CBC  D-DIMER, QUANTITATIVE  TROPONIN I  I-STAT TROPOININ, ED    Imaging Review Dg Chest 2 View  07/11/2014   CLINICAL DATA:  LEFT anterior and axillary chest pain beginning last night. Fever. Smoking history.  EXAM: CHEST  2 VIEW  COMPARISON:  Chest radiograph September 19, 2013  FINDINGS: Cardiomediastinal silhouette is unremarkable. The lungs are clear without pleural effusions or focal consolidations. Trachea projects midline and there is no pneumothorax. Soft tissue planes and included osseous structures are non-suspicious. Mild thoracic degenerative change. Moderate degenerative changes acromioclavicular joints.  IMPRESSION: No acute cardiopulmonary process.   Electronically Signed   By: Elon Alas   On: 07/11/2014 23:44     EKG Interpretation None      MDM   Final diagnoses:  Chest pain   Patient is a 58 y.o. Male who presents to the ED with chest pain.  Physical exam reveals left sided chest wall tenderness to palpation.  EKG shows NSR with some borderline T wave abnormalities on my read.  BMP unremarkable.  CBC unremarkable.  D-dimer negative.  Istat troponin negative.  Delta troponin is negative.  CXR reveals no acute abnormalities.  Suspect that this is likely chest pain secondary to musculoskeletal pain.  Will discharge the patient home with naproxen BID  AC and will have the patient follow-up with [facility].  Patient to return for worsening chest pain associated with shortness of breath, numbness, tingling, or any other concerning symptoms.  Patient states understanding and agreement. Patient was seen by Dr. Lita Mains and discussed with Dr. Lita Mains who agrees with the above plan and workup.  Patient stable for discharge.      Cherylann Parr, PA-C 07/12/14 [phone]

## 2014-07-12 NOTE — ED Provider Notes (Signed)
Medical screening examination/treatment/procedure(s) were conducted as a shared visit with non-physician practitioner(s) and myself.  I personally evaluated the patient during the encounter.   EKG Interpretation None      He presents with 2 days of consistent left lateral chest wall pain. Pain is worse with movement and deep breathing. No known trauma or heavy lifting. No shortness of breath or cough. No fever or chills. Pain is reproduced with palpation. Negative workup including d-dimer, troponin 2, and CXR.  Felt to be chest wall pain. Return precautions given.  Julianne Rice, MD 07/12/14 [phone]

## 2014-07-12 NOTE — Discharge Instructions (Signed)
Chest Wall Pain °Chest wall pain is pain in or around the bones and muscles of your chest. It may take up to 6 weeks to get better. It may take longer if you must stay physically active in your work and activities.  °CAUSES  °Chest wall pain may happen on its own. However, it may be caused by: °· A viral illness like the flu. °· Injury. °· Coughing. °· Exercise. °· Arthritis. °· Fibromyalgia. °· Shingles. °HOME CARE INSTRUCTIONS  °· Avoid overtiring physical activity. Try not to strain or perform activities that cause pain. This includes any activities using your chest or your abdominal and side muscles, especially if heavy weights are used. °· Put ice on the sore area. °¨ Put ice in a plastic bag. °¨ Place a towel between your skin and the bag. °¨ Leave the ice on for 15-20 minutes per hour while awake for the first 2 days. °· Only take over-the-counter or prescription medicines for pain, discomfort, or fever as directed by your caregiver. °SEEK IMMEDIATE MEDICAL CARE IF:  °· Your pain increases, or you are very uncomfortable. °· You have a fever. °· Your chest pain becomes worse. °· You have new, unexplained symptoms. °· You have nausea or vomiting. °· You feel sweaty or lightheaded. °· You have a cough with phlegm (sputum), or you cough up blood. °MAKE SURE YOU:  °· Understand these instructions. °· Will watch your condition. °· Will get help right away if you are not doing well or get worse. °Document Released: 08/29/2005 Document Revised: 11/21/2011 Document Reviewed: 04/25/2011 °ExitCare® Patient Information ©2015 [employer]. This information is not intended to replace advice given to you by your health care provider. Make sure you discuss any questions you have with your health care provider. ° ° °Emergency Department Resource Guide °1) Find a Doctor and Pay Out of Pocket °Although you won't have to find out who is covered by your insurance plan, it is a good idea to ask around and get recommendations. You  will then need to call the office and see if the doctor you have chosen will accept you as a new patient and what types of options they offer for patients who are self-pay. Some doctors offer discounts or will set up payment plans for their patients who do not have insurance, but you will need to ask so you aren't surprised when you get to your appointment. ° °2) Contact Your Local Health Department °Not all health departments have doctors that can see patients for sick visits, but many do, so it is worth a call to see if yours does. If you don't know where your local health department is, you can check in your phone book. The CDC also has a tool to help you locate your state's health department, and many state websites also have listings of all of their local health departments. ° °3) Find a Walk-in Clinic °If your illness is not likely to be very severe or complicated, you may want to try a walk in clinic. These are popping up all over the country in pharmacies, drugstores, and shopping centers. They're usually staffed by nurse practitioners or physician assistants that have been trained to treat common illnesses and complaints. They're usually fairly quick and inexpensive. However, if you have serious medical issues or chronic medical problems, these are probably not your best option. ° °No Primary Care Doctor: °- Call [facility] at  832-8000 - they can help you locate a primary care doctor that    accepts your insurance, provides certain services, etc. °- Physician Referral Service- 1-800-533-3463 ° °Chronic Pain Problems: °Organization         Address  Phone   Notes  °[facility]  (336) 297-2271 Patients need to be referred by their primary care doctor.  ° °Medication Assistance: °Organization         Address  Phone   Notes  °[place] [facility] [address] °[place], [place] [address] (336) 641-8030 --Must be a resident of [place] °-- Must  have NO insurance coverage whatsoever (no Medicaid/ Medicare, etc.) °-- The pt. MUST have a primary care doctor that directs their care regularly and follows them in the community °  °[facility]  (866) 331-1348   °[employer]  (888) 892-1162   ° °Agencies that provide inexpensive medical care: °Organization         Address  Phone   Notes  °[facility]  (336) 832-8035   °[facility]    (336) 832-7272   °[facility] [address] °[place], [place] [address] (336) 832-4777   °[facility] [address], °[place] (336) 271-4999   °[facility]    (336) 373-0678   °[facility]    (336) 272-1050   °[facility] ° [address], [place] Phone:  (336) 832-4444, Fax:  (336) 832-4440 Hours of Operation:  9 am - 6 pm, M-F.  Also accepts Medicaid/Medicare and self-pay.  °[facility] ° [address], [place] Phone: (336) [phone], Fax: (336) [phone]. Hours of Operation:  8:30 am - 5:30 pm, M-F.  Also accepts Medicaid and self-pay.  °[facility] [place] [address], [place] Phone: (336) 878-6027   °[facility] [address], [place], [place] (336)723-1848, Ext. 123 Mondays & Thursdays: 7-9 AM.  First 15 patients are seen on a first come, first serve basis. °  ° °Medicaid-accepting [place] Providers: ° °Organization         Address  Phone   Notes  °[facility] [address], [place] (336) 641-2100 Also accepts self-pay patients.  °[facility] [address], [place] ° (336) 856-9996   °[facility] [address], [place] (336) 288-8857   °[facility] [address] [place] [address], [place] (336) 299-7000   °Veita Bland [address], [place]  ° (336) 373-1557 Only accepts [place] Access Medicaid patients after  they have their name applied to their card.  ° °Self-Pay (no insurance) in [place]: ° °Organization         Address  Phone   Notes  °Sickle Cell Patients, [facility] [address], [place] (336) 832-1970   °[facility] [address], [place] (336) 832-4400   °[facility] [place] ° [address],  (336) 992-4800   °[facility]/Dr. Osei-Bonsu ° [address] [place] [address], [place] or [address], [place] (336) [phone] Phone number for both [place] and [place] locations is the same.  °[facility] [address],  (336) 299-0000   °[facility]  [address] [place] [address],  or [address] (336) 852-7530 °(336) 878-2260   °[facility] [address], [place] (336) 350-1642, phone; (336) 294-5005, fax Sees patients 1st and 3rd Saturday of every month.  Must not qualify for public or private insurance (i.e. Medicaid, Medicare, [place] Health Choice, Veterans' Benefits) • Household income should be no more than 200% of the poverty level •The clinic cannot treat you if you are pregnant or think you are pregnant • Sexually transmitted diseases are not treated at the clinic.  ° ° °Dental Care: °Organization         Address  Phone  Notes  °[facility] [facility] [address], [place] (336) 641-6152 Accepts children up to age 21 who are enrolled in Medicaid or [place] Health Choice; pregnant women with a Medicaid card; and children who have applied for Medicaid or [place] Health Choice, but were declined, whose parents can pay a reduced fee at time of service.  °[facility] [place]  [address], [place] (336) 641-7733 Accepts children up to age 21 who are enrolled in Medicaid or [place] Health Choice; pregnant women with a Medicaid card; and children who  have applied for Medicaid or [place] Health Choice, but were declined, whose parents can pay a reduced fee at time of service.  °[facility] ° [address], [place] (336) 641-4533 Patients are seen by appointment only. Walk-ins are not accepted. [facility] will see patients 18 years of age and older. °Monday - Tuesday (8am-5pm) °Most Wednesdays (8:30-5pm) °$30 per visit, cash only  °[facility] ° [address], [place] (336) 641-4533 Patients are seen by appointment only. Walk-ins are not accepted. [facility] will see patients 18 years of age and older. °One Wednesday Evening (Monthly: Volunteer Based).  $30 per visit, cash only  °[facility]  (919) 537-3737 for adults; Children under age 4, call Graduate Pediatric Dentistry at ([phone]) [phone]. Children aged 4-14, please call ([phone]) [phone] to request a pediatric application. ° Dental services are provided in all areas of dental care including fillings, crowns and bridges, complete and partial dentures, implants, gum treatment, root canals, and extractions. Preventive care is also provided. Treatment is provided to both adults and children. °Patients are selected via a lottery and there is often a waiting list. °  °[facility] [address], ° ° (336) 763-8833 [email or web address] °  °[facility] [address], [place], [place] (336)723-1848, Ext. 123 Second and Fourth Thursday of each month, opens at 6:30 AM; Clinic ends at 9 AM.  Patients are seen on a first-come first-served basis, and a limited number are seen during each clinic.  ° °[facility] ° [address], [place], [place] (336) 723-7904   Eligibility Requirements °You must have lived in [place], [place], or [place] counties for at least the last three months. °  You cannot be eligible for state or federal sponsored healthcare insurance, including Veterans  Administration, Medicaid, or Medicare. °  You generally cannot be eligible for healthcare insurance through your employer.  °  How to apply: °Eligibility screenings are held every Tuesday and Wednesday afternoon from 1:00 pm until 4:00 pm. You do not need an appointment for the interview!  °[facility] [address], [place], [place] 336-631-2330   °[facility]  336-342-8273   °[facility]  336-703-3100   °[facility]    336-570-6415   ° °Behavioral Health Resources in the Community: °Intensive Outpatient Programs °Organization         Address  Phone  Notes  °[facility] [address], [place], [place] 336-878-6098   °[facility] [address], [place], [place] 336-832-9800   °[facility] [address], [place], [place] ° 336-882-2125   °[facility] [address],  °[place], [place] 1-800-853-5163 or 336-641-4981   °Substance Abuse Resources °Organization         Address  Phone  Notes  °[facility]  336-882-2125   °[facility]  336-784-9470   °[facility]  336-285-9073   °[facility]  336-845-3988   °Residential & Outpatient Substance Abuse Program  1-800-659-3381   °Psychological Services °Organization         Address  Phone  Notes  °[facility]  336- 832-9600   °[facility]  336- 378-7881   °[facility] [address], [place] 1-800-853-5163 or 336-641-4981   ° °Mobile Crisis Teams °Organization         Address  Phone  Notes  °[facility], Mobile Crisis Care Unit  1-877-626-1772   °Assertive °[facility] ° [address] [place], [place] 336-834-9664   °Sharon DeEsch [address] °[place] 336-554-5454   ° °Self-Help/Support Groups °Organization         Address  Phone             Notes  °[facility] [place] -  variety of support groups  336- 373-1402 Call for more information  °Narcotics Anonymous (NA), [facility] [address], °[place] [employer]  2 meetings at this location  ° °Residential Treatment Programs °Organization         Address  Phone  Notes  °[facility] [address],    °[place]  1-866-801-8205   °[facility] ° [address], [place], [place] 704-293-8524   °[facility] [address], [place] 336-845-3988 Admissions: 8am-3pm M-F  °[facility] [address]    °[place], [place] 336-841-1104   °[facility] [address], [place], [place] 336-379-7146   °[facility] [address]  °[place], [place] 336-285-9073   °[facility] Intensive Outpatient [address], , [place] 336-852-3033   °[facility] [address]  °[place], [place] 1-877-615-2722 or 336-784-9470   °[facility] [address] [place], [place] 336-227-7417 Accepts Medicaid  °[facility] [address]  ° [place] 1-800-659-3381 Substance Abuse/Addiction Treatment  ° °[place] Behavioral Health Resources °Organization         Address  Phone  Notes  °[facility]  (888) 581-9988   °Julie Brannon, PhD [address] [place], [place]   (336) 349-5553 or (336) 951-0000   °[facility]   [address] °, [place] (336) 349-4454   °[facility] [address], [place], [place] (336) 342-8316 Insurance/Medicaid/sponsorship through [facility]  °[facility] [address]                                    , [place] (336) 342-8316 Therapy/tele-psych/case  °[facility] [address]  ° [place], [place] (336) 349-2233    °Dr. Arfeen  (336) 349-4544   °[facility] [place]  [employer] [place] [facility] 1) [address], [place] °2) [address], [place] °3)  [address], [place] (336) 349-3220 °(336) 342-7768 ° °(336) 342-8140   °[place] [facility] (336) 342-1394 or (336) 342-3537 (After Hours)    ° ° ° °

## 2015-03-17 ENCOUNTER — Ambulatory Visit (HOSPITAL_COMMUNITY)
Admission: RE | Admit: 2015-03-17 | Discharge: 2015-03-17 | Disposition: A | Discharge status: Home / Self Care | Payer: Self-pay | Source: Ambulatory Visit | Attending: Internal Medicine | Admitting: Internal Medicine

## 2015-03-17 ENCOUNTER — Other Ambulatory Visit (HOSPITAL_COMMUNITY): Payer: Self-pay | Admitting: Internal Medicine

## 2015-03-17 DIAGNOSIS — M5137 Other intervertebral disc degeneration, lumbosacral region: Secondary | ICD-10-CM | POA: Insufficient documentation

## 2015-03-17 DIAGNOSIS — M5440 Lumbago with sciatica, unspecified side: Secondary | ICD-10-CM

## 2015-03-17 DIAGNOSIS — M549 Dorsalgia, unspecified: Secondary | ICD-10-CM

## 2015-07-08 ENCOUNTER — Emergency Department (HOSPITAL_COMMUNITY): Payer: PRIVATE HEALTH INSURANCE

## 2015-07-08 ENCOUNTER — Encounter (HOSPITAL_COMMUNITY): Payer: Self-pay | Admitting: Emergency Medicine

## 2015-07-08 ENCOUNTER — Emergency Department (HOSPITAL_COMMUNITY)
Admission: EM | Admit: 2015-07-08 | Discharge: 2015-07-08 | Disposition: A | Discharge status: Home / Self Care | Payer: PRIVATE HEALTH INSURANCE | Attending: Emergency Medicine | Admitting: Emergency Medicine

## 2015-07-08 DIAGNOSIS — Z720 Tobacco use: Secondary | ICD-10-CM | POA: Insufficient documentation

## 2015-07-08 DIAGNOSIS — Z791 Long term (current) use of non-steroidal anti-inflammatories (NSAID): Secondary | ICD-10-CM | POA: Insufficient documentation

## 2015-07-08 DIAGNOSIS — Z7982 Long term (current) use of aspirin: Secondary | ICD-10-CM | POA: Insufficient documentation

## 2015-07-08 DIAGNOSIS — I10 Essential (primary) hypertension: Secondary | ICD-10-CM | POA: Insufficient documentation

## 2015-07-08 DIAGNOSIS — R0789 Other chest pain: Secondary | ICD-10-CM | POA: Insufficient documentation

## 2015-07-08 DIAGNOSIS — Z79899 Other long term (current) drug therapy: Secondary | ICD-10-CM | POA: Insufficient documentation

## 2015-07-08 HISTORY — DX: Essential (primary) hypertension: I10

## 2015-07-08 LAB — BASIC METABOLIC PANEL
Anion gap: 8 (ref 5–15)
BUN: 18 mg/dL (ref 6–20)
CALCIUM: 9.1 mg/dL (ref 8.9–10.3)
CO2: 27 mmol/L (ref 22–32)
Chloride: 103 mmol/L (ref 101–111)
Creatinine, Ser: 1.17 mg/dL (ref 0.61–1.24)
GFR calc Af Amer: >60 mL/min (ref >60)
GFR calc non Af Amer: >60 mL/min (ref >60)
Glucose, Bld: 97 mg/dL (ref 65–99)
Potassium: 3.9 mmol/L (ref 3.5–5.1)
Sodium: 138 mmol/L (ref 135–145)

## 2015-07-08 LAB — CBC
HEMATOCRIT: 46 % (ref 39.0–52.0)
Hemoglobin: 15.7 g/dL (ref 13.0–17.0)
MCH: 31.3 pg (ref 26.0–34.0)
MCHC: 34.1 g/dL (ref 30.0–36.0)
MCV: 91.6 fL (ref 78.0–100.0)
Platelets: 215 10*3/uL (ref 150–400)
RBC: 5.02 MIL/uL (ref 4.22–5.81)
RDW: 13.9 % (ref 11.5–15.5)
WBC: 6.3 10*3/uL (ref 4.0–10.5)

## 2015-07-08 LAB — I-STAT TROPONIN, ED: TROPONIN I, POC: 0.01 ng/mL (ref 0.00–0.08)

## 2015-07-08 MED ORDER — IBUPROFEN 200 MG PO TABS
600.0000 mg | ORAL_TABLET | Freq: Once | ORAL | Status: AC
  Administered 2015-07-08: 600 mg via ORAL
  Filled 2015-07-08: qty 3

## 2015-07-08 NOTE — ED Notes (Signed)
Pt states he is having pain in his left chest that started in the middle of the night  Pt states the pain is constant  Pt states he had numbness and tingling in his right arm but that has resolved  Pt states the pain is worse if he turns his head to the left  Pt states it feels hard to take a deep breath

## 2015-07-08 NOTE — ED Notes (Signed)
Pt complains of left sided chest pain since last night ans right arm numbness

## 2015-07-08 NOTE — ED Provider Notes (Signed)
CSN: [id number]     Arrival date & time 07/08/15  1936 History   First MD Initiated Contact with Patient 07/08/15 2114     Chief Complaint  Patient presents with  . Chest Pain     (Consider location/radiation/quality/duration/timing/severity/associated sxs/prior Treatment) HPI 59 year old male with history of hypertension and tobacco use who presents with chest discomfort. States that yesterday afternoon at around 4 PM developed pressure and soreness over his left chest wall. Pain has been continuous since then, and exacerbated by turning his neck/head leftward. Also notices it with movement of his left arm. Reports that he has been raking and cleaning his yard prior to onset of symptoms, and at work does stocking and repetitive lifting. Feels that the pressure makes him want to take deeper breaths, but denies pleuritic nature to chest pain. Has not had any associated lightheadedness, syncope, diaphoresis, nausea, vomiting, or abdominal pain. Denies recent illnesses including fevers, chills, cough, congestion or runny nose. States that with activity and walking, pain is not worsened.   Past Medical History  Diagnosis Date  . Hypertension    History reviewed. No pertinent past surgical history. Family History  Problem Relation Age of Onset  . Heart attack Father   . Heart attack Sister    Social History  Substance Use Topics  . Smoking status: Current Every Day Smoker -- 0.50 packs/day    Types: Cigarettes  . Smokeless tobacco: None  . Alcohol Use: Yes     Comment: 1/2 pint liquor daily    Review of Systems 10/14 systems reviewed and are negative other than those stated in the HPI    Allergies  Other  Home Medications   Prior to Admission medications   Medication Sig Start Date End Date Taking? Authorizing Provider  aspirin 325 MG tablet Take 325 mg by mouth daily.   Yes Historical Provider, MD  meloxicam (MOBIC) 7.5 MG tablet Take 7.5 mg by mouth daily.   Yes Historical  Provider, MD  triamterene-hydrochlorothiazide (DYAZIDE) 37.5-25 MG capsule Take 1 capsule by mouth daily.   Yes Historical Provider, MD   BP 133/82 mmHg  Pulse 65  Temp(Src) 98.2 F (36.8 C) (Oral)  Resp 18  SpO2 99% Physical Exam Physical Exam  Nursing note and vitals reviewed. Constitutional: Well developed, well nourished, non-toxic, and in no acute distress Head: Normocephalic and atraumatic.  Mouth/Throat: Oropharynx is clear and moist.  Neck: Normal range of motion. Neck supple.  Cardiovascular: Normal rate and regular rhythm.   Pulmonary/Chest: Effort normal and breath sounds normal. Reproducible pain with turning of his neck towards the left.  Abdominal: Soft. There is no tenderness. There is no rebound and no guarding.  Musculoskeletal: Normal range of motion.  Neurological: Alert, no facial droop, fluent speech, moves all extremities symmetrically Skin: Skin is warm and dry.  Psychiatric: Cooperative  ED Course  Procedures (including critical care time) Labs Review Labs Reviewed  BASIC METABOLIC PANEL  CBC  I-STAT TROPOININ, ED    Imaging Review Dg Chest 2 View  07/08/2015  CLINICAL DATA:  59 year old male with pain in the left chest since yesterday evening. Numbness and tingling in the right arm. EXAM: CHEST  2 VIEW COMPARISON:  Chest x-ray 07/11/2014. FINDINGS: Lung volumes are normal. No consolidative airspace disease. No pleural effusions. No pneumothorax. No pulmonary nodule or mass noted. Pulmonary vasculature and the cardiomediastinal silhouette are within normal limits. IMPRESSION: No radiographic evidence of acute cardiopulmonary disease. Electronically Signed   By: Mauri Brooklyn.D.  On: 07/08/2015 20:01   I have personally reviewed and evaluated these images and lab results as part of my medical decision-making.   EKG Interpretation   Date/Time:  Wednesday July 08 2015 19:44:30 EDT Ventricular Rate:  66 PR Interval:  132 QRS Duration: 84 QT  Interval:  472 QTC Calculation: 495 R Axis:   60 Text Interpretation:  Sinus rhythm TWI in inferior leads, not changed from  prior EKG Confirmed by Wilborn Membreno MD, Trenten Watchman ([id number]) on 07/08/2015 9:20:44 PM      MDM   Final diagnoses:  Chest wall pain    59 year old male who presents with chest pain, which seems to be more chest wall/musculoskeletal in nature. He is well-appearing in no acute distress, and vital signs are within normal limits. Pain is reproducible on exam with turning his neck towards the left. Cardiopulmonary exam is otherwise unremarkable. His EKG is not acutely ischemic, unchanged from prior. Chest x-ray shows no acute cardiopulmonary processes. Troponin 1 is negative, and the setting of continuous pain over a 24-hour period, I do not think that serial troponins are necessary, and I think presentation is unlikely secondary to ACS. Pain not worsened with exertional activities, and fully reproducible. Heart score of 3 for risk factors and age; still considered lower risk. History not suggestive of PE or dissection. Do not suspect serious or toxic etiology of symptoms. Discussed supportive care for home. Strict return and follow-up instructions reviewed. He expressed understanding of all discharge instructions and felt comfortable with the plan of care.     Forde Dandy, MD 07/08/15 2141

## 2015-07-08 NOTE — Discharge Instructions (Signed)
Take motrin and tylenol for pain control. Return without fail for worsening symptoms, including worsening pain or difficulty breathing, vomiting and unable to keep down food/fluids, feeling lightheaded or passing out, or any other symptoms concerning to you.  Chest Wall Pain Chest wall pain is pain in or around the bones and muscles of your chest. Sometimes, an injury causes this pain. Sometimes, the cause may not be known. This pain may take several weeks or longer to get better. HOME CARE INSTRUCTIONS  Pay attention to any changes in your symptoms. Take these actions to help with your pain:   Rest as told by your health care provider.   Avoid activities that cause pain. These include any activities that use your chest muscles or your abdominal and side muscles to lift heavy items.   If directed, apply ice to the painful area:  Put ice in a plastic bag.  Place a towel between your skin and the bag.  Leave the ice on for 20 minutes, 2-3 times per day.  Take over-the-counter and prescription medicines only as told by your health care provider.  Do not use tobacco products, including cigarettes, chewing tobacco, and e-cigarettes. If you need help quitting, ask your health care provider.  Keep all follow-up visits as told by your health care provider. This is important. SEEK MEDICAL CARE IF:  You have a fever.  Your chest pain becomes worse.  You have new symptoms. SEEK IMMEDIATE MEDICAL CARE IF:  You have nausea or vomiting.  You feel sweaty or light-headed.  You have a cough with phlegm (sputum) or you cough up blood.  You develop shortness of breath.   This information is not intended to replace advice given to you by your health care provider. Make sure you discuss any questions you have with your health care provider.   Document Released: 08/29/2005 Document Revised: 05/20/2015 Document Reviewed: 11/24/2014 Elsevier Interactive Patient Education [employer].

## 2015-09-13 DIAGNOSIS — I219 Acute myocardial infarction, unspecified: Secondary | ICD-10-CM

## 2015-09-13 HISTORY — DX: Acute myocardial infarction, unspecified: I21.9

## 2015-10-12 ENCOUNTER — Emergency Department (HOSPITAL_COMMUNITY): Payer: PRIVATE HEALTH INSURANCE

## 2015-10-12 ENCOUNTER — Encounter (HOSPITAL_COMMUNITY): Payer: Self-pay | Admitting: Emergency Medicine

## 2015-10-12 ENCOUNTER — Emergency Department (HOSPITAL_COMMUNITY)
Admission: EM | Admit: 2015-10-12 | Discharge: 2015-10-12 | Disposition: A | Discharge status: Home / Self Care | Payer: PRIVATE HEALTH INSURANCE | Attending: Emergency Medicine | Admitting: Emergency Medicine

## 2015-10-12 DIAGNOSIS — Z79899 Other long term (current) drug therapy: Secondary | ICD-10-CM | POA: Insufficient documentation

## 2015-10-12 DIAGNOSIS — M545 Low back pain, unspecified: Secondary | ICD-10-CM

## 2015-10-12 DIAGNOSIS — R51 Headache: Secondary | ICD-10-CM | POA: Insufficient documentation

## 2015-10-12 DIAGNOSIS — Z7982 Long term (current) use of aspirin: Secondary | ICD-10-CM | POA: Insufficient documentation

## 2015-10-12 DIAGNOSIS — Z791 Long term (current) use of non-steroidal anti-inflammatories (NSAID): Secondary | ICD-10-CM | POA: Insufficient documentation

## 2015-10-12 DIAGNOSIS — I10 Essential (primary) hypertension: Secondary | ICD-10-CM | POA: Insufficient documentation

## 2015-10-12 DIAGNOSIS — F17210 Nicotine dependence, cigarettes, uncomplicated: Secondary | ICD-10-CM | POA: Insufficient documentation

## 2015-10-12 DIAGNOSIS — R519 Headache, unspecified: Secondary | ICD-10-CM

## 2015-10-12 LAB — URINALYSIS, ROUTINE W REFLEX MICROSCOPIC
Bilirubin Urine: NEGATIVE
Glucose, UA: NEGATIVE mg/dL
Hgb urine dipstick: NEGATIVE
Ketones, ur: NEGATIVE mg/dL
Leukocytes, UA: NEGATIVE
Nitrite: NEGATIVE
Protein, ur: NEGATIVE mg/dL
Specific Gravity, Urine: 1.028 (ref 1.005–1.030)
pH: 6 (ref 5.0–8.0)

## 2015-10-12 MED ORDER — DIPHENHYDRAMINE HCL 50 MG/ML IJ SOLN
12.5000 mg | Freq: Once | INTRAMUSCULAR | Status: AC
  Administered 2015-10-12: 12.5 mg via INTRAVENOUS
  Filled 2015-10-12: qty 1

## 2015-10-12 MED ORDER — KETOROLAC TROMETHAMINE 15 MG/ML IJ SOLN
15.0000 mg | Freq: Once | INTRAMUSCULAR | Status: AC
  Administered 2015-10-12: 15 mg via INTRAVENOUS
  Filled 2015-10-12: qty 1

## 2015-10-12 MED ORDER — PROCHLORPERAZINE EDISYLATE 5 MG/ML IJ SOLN
10.0000 mg | Freq: Four times a day (QID) | INTRAMUSCULAR | Status: DC | PRN
  Administered 2015-10-12: 10 mg via INTRAVENOUS
  Filled 2015-10-12: qty 2

## 2015-10-12 MED ORDER — SODIUM CHLORIDE 0.9 % IV BOLUS (SEPSIS)
1000.0000 mL | Freq: Once | INTRAVENOUS | Status: AC
  Administered 2015-10-12: 1000 mL via INTRAVENOUS

## 2015-10-12 MED ORDER — TRAMADOL HCL 50 MG PO TABS: 50.0000 mg | ORAL_TABLET | Freq: Four times a day (QID) | ORAL | Status: DC | PRN

## 2015-10-12 MED ORDER — METHOCARBAMOL 500 MG PO TABS: 500.0000 mg | ORAL_TABLET | Freq: Two times a day (BID) | ORAL | Status: DC | PRN

## 2015-10-12 MED ORDER — MORPHINE SULFATE (PF) 4 MG/ML IV SOLN
4.0000 mg | Freq: Once | INTRAVENOUS | Status: AC
Start: 2015-10-12 — End: 2015-10-12
  Administered 2015-10-12: 4 mg via INTRAVENOUS
  Filled 2015-10-12: qty 1

## 2015-10-12 NOTE — Discharge Instructions (Signed)

## 2015-10-12 NOTE — ED Notes (Signed)
Pt c/o headache x 1 week and back pain x 2 months. Pt requesting back xray. Pt denies hx of migraines. Pt c/o sensitivity to light. Pt denies blurry vision/double vision. Denies weakness, dizziness. Pt denies injury to back. A&Ox4 and ambulatory.

## 2015-10-14 NOTE — ED Provider Notes (Signed)
CSN: [id number]     Arrival date & time 10/12/15  1827 History   First MD Initiated Contact with Patient 10/12/15 1956     Chief Complaint  Patient presents with  . Headache  . Back Pain     (Consider location/radiation/quality/duration/timing/severity/associated sxs/prior Treatment) HPI   60 year old male with  Headache. Onset about a week ago. Constant since then. Patient has a past history of what he calls migraines. Current symptoms feel similar to previous headaches. No fevers or chills. Photosensitivity. Denies any acute change in visual acuity. No acute numbness, tingling or focal loss of strength. No blood thinners. No neck pain or neck stiffness. Also complaining of some mid to lower back pain which sounds acute on chronic. Worsening within the last couple days. 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 No urinary complaints. No acute numbness, tingling or focal loss of strength.  Past Medical History  Diagnosis Date  . Hypertension    History reviewed. No pertinent past surgical history. Family History  Problem Relation Age of Onset  . Heart attack Father   . Heart attack Sister    Social History  Substance Use Topics  . Smoking status: Current Every Day Smoker -- 0.50 packs/day    Types: Cigarettes  . Smokeless tobacco: None  . Alcohol Use: Yes     Comment: 1/2 pint liquor daily    Review of Systems  All systems reviewed and negative, other than as noted in HPI.   Allergies  Other  Home Medications   Prior to Admission medications   Medication Sig Start Date End Date Taking? Authorizing Provider  aspirin 325 MG tablet Take 325 mg by mouth daily.   Yes Historical Provider, MD  ibuprofen (ADVIL,MOTRIN) 200 MG tablet Take 400 mg by mouth every 6 (six) hours as needed for moderate pain.   Yes Historical Provider, MD  meloxicam (MOBIC) 7.5 MG tablet Take 7.5 mg by mouth daily.   Yes Historical Provider, MD  triamterene-hydrochlorothiazide (DYAZIDE) 37.5-25  MG capsule Take 1 capsule by mouth daily.   Yes Historical Provider, MD  methocarbamol (ROBAXIN) 500 MG tablet Take 1 tablet (500 mg total) by mouth 2 (two) times daily as needed for muscle spasms. 10/12/15   Virgel Manifold, MD  traMADol (ULTRAM) 50 MG tablet Take 1 tablet (50 mg total) by mouth every 6 (six) hours as needed. 10/12/15   Virgel Manifold, MD   BP 169/94 mmHg  Pulse 73  Temp(Src) 98.1 F (36.7 C) (Oral)  Resp 16  SpO2 95% Physical Exam  Constitutional: He is oriented to person, place, and time. He appears well-developed and well-nourished. No distress.  HENT:  Head: Normocephalic and atraumatic.  Eyes: Conjunctivae are normal. Right eye exhibits no discharge. Left eye exhibits no discharge.  Neck: Neck supple.  No nuchal rigidity  Cardiovascular: Normal rate, regular rhythm and normal heart sounds.  Exam reveals no gallop and no friction rub.   No murmur heard. Pulmonary/Chest: Effort normal and breath sounds normal. No respiratory distress.  Abdominal: Soft. He exhibits no distension. There is no tenderness.  Musculoskeletal: He exhibits no edema or tenderness.  Neurological: He is alert and oriented to person, place, and time. No cranial nerve deficit. He exhibits normal muscle tone. Coordination normal.  Good finger to nose testing bilaterally  Skin: Skin is warm and dry.  Psychiatric: He has a normal mood and affect. His behavior is normal. Thought content normal.  Nursing note and vitals reviewed.   ED Course  Procedures (including critical care time) Labs Review Labs Reviewed  URINALYSIS, ROUTINE W REFLEX MICROSCOPIC (NOT AT [facility]) - Abnormal; Notable for the following:    APPearance CLOUDY (*)    All other components within normal limits    Imaging Review Dg Lumbar Spine Complete  10/12/2015  CLINICAL DATA:  Right-sided low back pain for 3 months. EXAM: LUMBAR SPINE - COMPLETE 4+ VIEW COMPARISON:  03/17/2015 FINDINGS: There is no fracture or bone destruction.  There is severe bilateral facet arthritis at L4-5 with a 2 mm spondylolisthesis. Minimal narrowing of the disc space. Chronic narrowing of the L5-S1 disc space with moderate bilateral facet arthritis at that level. Calcification in the abdominal aorta. IMPRESSION: No acute abnormalities. 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 Electronically Signed   By: Lorriane Shire M.D.   On: 10/12/2015 20:41   I have personally reviewed and evaluated these images and lab results as part of my medical decision-making.   EKG Interpretation None      MDM   Final diagnoses:  Nonintractable headache, unspecified chronicity pattern, unspecified headache type  Right-sided low back pain without sciatica        Virgel Manifold, MD 10/14/15 1330

## 2016-03-12 ENCOUNTER — Encounter (HOSPITAL_COMMUNITY): Payer: Self-pay | Admitting: Emergency Medicine

## 2016-03-12 ENCOUNTER — Emergency Department (HOSPITAL_COMMUNITY): Payer: Self-pay

## 2016-03-12 ENCOUNTER — Inpatient Hospital Stay (HOSPITAL_COMMUNITY)
Admission: EM | Admit: 2016-03-12 | Discharge: 2016-03-15 | DRG: 247 | Disposition: A | Discharge status: Home / Self Care | Payer: Self-pay | Attending: Internal Medicine | Admitting: Internal Medicine

## 2016-03-12 DIAGNOSIS — F17200 Nicotine dependence, unspecified, uncomplicated: Secondary | ICD-10-CM

## 2016-03-12 DIAGNOSIS — I25119 Atherosclerotic heart disease of native coronary artery with unspecified angina pectoris: Secondary | ICD-10-CM | POA: Diagnosis present

## 2016-03-12 DIAGNOSIS — Z720 Tobacco use: Secondary | ICD-10-CM

## 2016-03-12 DIAGNOSIS — F1490 Cocaine use, unspecified, uncomplicated: Secondary | ICD-10-CM

## 2016-03-12 DIAGNOSIS — I214 Non-ST elevation (NSTEMI) myocardial infarction: Principal | ICD-10-CM | POA: Diagnosis present

## 2016-03-12 DIAGNOSIS — E785 Hyperlipidemia, unspecified: Secondary | ICD-10-CM

## 2016-03-12 DIAGNOSIS — Z7151 Drug abuse counseling and surveillance of drug abuser: Secondary | ICD-10-CM

## 2016-03-12 DIAGNOSIS — Z955 Presence of coronary angioplasty implant and graft: Secondary | ICD-10-CM

## 2016-03-12 DIAGNOSIS — F1010 Alcohol abuse, uncomplicated: Secondary | ICD-10-CM

## 2016-03-12 DIAGNOSIS — I252 Old myocardial infarction: Secondary | ICD-10-CM

## 2016-03-12 DIAGNOSIS — I2510 Atherosclerotic heart disease of native coronary artery without angina pectoris: Secondary | ICD-10-CM

## 2016-03-12 DIAGNOSIS — Z8249 Family history of ischemic heart disease and other diseases of the circulatory system: Secondary | ICD-10-CM

## 2016-03-12 DIAGNOSIS — F17210 Nicotine dependence, cigarettes, uncomplicated: Secondary | ICD-10-CM | POA: Diagnosis present

## 2016-03-12 DIAGNOSIS — I5020 Unspecified systolic (congestive) heart failure: Secondary | ICD-10-CM

## 2016-03-12 DIAGNOSIS — I119 Hypertensive heart disease without heart failure: Secondary | ICD-10-CM

## 2016-03-12 DIAGNOSIS — Z9861 Coronary angioplasty status: Secondary | ICD-10-CM

## 2016-03-12 DIAGNOSIS — R079 Chest pain, unspecified: Secondary | ICD-10-CM

## 2016-03-12 DIAGNOSIS — I5022 Chronic systolic (congestive) heart failure: Secondary | ICD-10-CM

## 2016-03-12 DIAGNOSIS — Z888 Allergy status to other drugs, medicaments and biological substances status: Secondary | ICD-10-CM

## 2016-03-12 DIAGNOSIS — I110 Hypertensive heart disease with heart failure: Secondary | ICD-10-CM | POA: Diagnosis present

## 2016-03-12 DIAGNOSIS — F1410 Cocaine abuse, uncomplicated: Secondary | ICD-10-CM | POA: Diagnosis present

## 2016-03-12 DIAGNOSIS — I501 Left ventricular failure: Secondary | ICD-10-CM | POA: Diagnosis present

## 2016-03-12 DIAGNOSIS — I10 Essential (primary) hypertension: Secondary | ICD-10-CM | POA: Insufficient documentation

## 2016-03-12 DIAGNOSIS — I255 Ischemic cardiomyopathy: Secondary | ICD-10-CM

## 2016-03-12 HISTORY — DX: Hypertensive heart disease without heart failure: I11.9

## 2016-03-12 HISTORY — DX: Ischemic cardiomyopathy: I25.5

## 2016-03-12 HISTORY — DX: Alcohol abuse, uncomplicated: F10.10

## 2016-03-12 HISTORY — DX: Tobacco use: Z72.0

## 2016-03-12 HISTORY — DX: Atherosclerotic heart disease of native coronary artery without angina pectoris: I25.10

## 2016-03-12 HISTORY — DX: Hyperlipidemia, unspecified: E78.5

## 2016-03-12 LAB — I-STAT TROPONIN, ED: Troponin i, poc: 0.2 ng/mL (ref 0.00–0.08)

## 2016-03-12 LAB — LIPID PANEL
CHOL/HDL RATIO: 3.1 ratio
Cholesterol: 180 mg/dL (ref 0–200)
HDL: 59 mg/dL (ref >40)
LDL CALC: 113 mg/dL — AB (ref 0–99)
Triglycerides: 38 mg/dL (ref <150)
VLDL: 8 mg/dL (ref 0–40)

## 2016-03-12 LAB — MRSA PCR SCREENING: MRSA by PCR: NEGATIVE

## 2016-03-12 LAB — BASIC METABOLIC PANEL
Anion gap: 10 (ref 5–15)
BUN: 9 mg/dL (ref 6–20)
CO2: 24 mmol/L (ref 22–32)
Calcium: 9.1 mg/dL (ref 8.9–10.3)
Chloride: 106 mmol/L (ref 101–111)
Creatinine, Ser: 1.02 mg/dL (ref 0.61–1.24)
GFR calc Af Amer: >60 mL/min (ref >60)
GLUCOSE: 99 mg/dL (ref 65–99)
POTASSIUM: 4.6 mmol/L (ref 3.5–5.1)
Sodium: 140 mmol/L (ref 135–145)

## 2016-03-12 LAB — TROPONIN I
TROPONIN I: 0.42 ng/mL — AB (ref <0.03)
Troponin I: 0.14 ng/mL (ref <0.03)
Troponin I: 0.77 ng/mL (ref <0.03)

## 2016-03-12 LAB — CBC
HEMATOCRIT: 46.9 % (ref 39.0–52.0)
Hemoglobin: 15.9 g/dL (ref 13.0–17.0)
MCH: 31.7 pg (ref 26.0–34.0)
MCHC: 33.9 g/dL (ref 30.0–36.0)
MCV: 93.4 fL (ref 78.0–100.0)
Platelets: 210 10*3/uL (ref 150–400)
RBC: 5.02 MIL/uL (ref 4.22–5.81)
RDW: 14.2 % (ref 11.5–15.5)
WBC: 5 10*3/uL (ref 4.0–10.5)

## 2016-03-12 LAB — HEPARIN LEVEL (UNFRACTIONATED): Heparin Unfractionated: 0.26 IU/mL — ABNORMAL LOW (ref 0.30–0.70)

## 2016-03-12 MED ORDER — SODIUM CHLORIDE 0.9% FLUSH: 3.0000 mL | INTRAVENOUS | Status: DC | PRN

## 2016-03-12 MED ORDER — ADULT MULTIVITAMIN W/MINERALS CH
1.0000 | ORAL_TABLET | Freq: Every day | ORAL | Status: DC
  Administered 2016-03-12 – 2016-03-15 (×3): 1 via ORAL
  Filled 2016-03-12 (×2): qty 1

## 2016-03-12 MED ORDER — ASPIRIN EC 81 MG PO TBEC: 81.0000 mg | DELAYED_RELEASE_TABLET | Freq: Every day | ORAL | Status: DC

## 2016-03-12 MED ORDER — ATORVASTATIN CALCIUM 40 MG PO TABS: 40.0000 mg | ORAL_TABLET | Freq: Every day | ORAL | Status: DC

## 2016-03-12 MED ORDER — NITROGLYCERIN IN D5W 200-5 MCG/ML-% IV SOLN: 3.0000 ug/min | INTRAVENOUS | Status: DC

## 2016-03-12 MED ORDER — LORAZEPAM 2 MG/ML IJ SOLN: 1.0000 mg | Freq: Four times a day (QID) | INTRAMUSCULAR | Status: DC | PRN

## 2016-03-12 MED ORDER — NITROGLYCERIN IN D5W 200-5 MCG/ML-% IV SOLN
3.0000 ug/min | Freq: Once | INTRAVENOUS | Status: AC
  Administered 2016-03-12: 3 ug/min via INTRAVENOUS
  Filled 2016-03-12: qty 250

## 2016-03-12 MED ORDER — HEPARIN (PORCINE) IN NACL 100-0.45 UNIT/ML-% IJ SOLN
1200.0000 [IU]/h | INTRAMUSCULAR | Status: DC
  Administered 2016-03-12: 950 [IU]/h via INTRAVENOUS
  Administered 2016-03-13: 1200 [IU]/h via INTRAVENOUS
  Filled 2016-03-12 (×3): qty 250

## 2016-03-12 MED ORDER — SODIUM CHLORIDE 0.9 % WEIGHT BASED INFUSION
3.0000 mL/kg/h | INTRAVENOUS | Status: DC
  Administered 2016-03-14: 3 mL/kg/h via INTRAVENOUS

## 2016-03-12 MED ORDER — SODIUM CHLORIDE 0.9 % IV SOLN: 250.0000 mL | INTRAVENOUS | Status: DC | PRN

## 2016-03-12 MED ORDER — THIAMINE HCL 100 MG/ML IJ SOLN
100.0000 mg | Freq: Every day | INTRAMUSCULAR | Status: DC
  Filled 2016-03-12: qty 1

## 2016-03-12 MED ORDER — HEPARIN BOLUS VIA INFUSION
4000.0000 [IU] | Freq: Once | INTRAVENOUS | Status: AC
  Administered 2016-03-12: 4000 [IU] via INTRAVENOUS
  Filled 2016-03-12: qty 4000

## 2016-03-12 MED ORDER — FOLIC ACID 1 MG PO TABS
1.0000 mg | ORAL_TABLET | Freq: Every day | ORAL | Status: DC
  Administered 2016-03-12 – 2016-03-15 (×3): 1 mg via ORAL
  Filled 2016-03-12 (×3): qty 1

## 2016-03-12 MED ORDER — SODIUM CHLORIDE 0.9% FLUSH
3.0000 mL | Freq: Two times a day (BID) | INTRAVENOUS | Status: DC
  Administered 2016-03-12: 3 mL via INTRAVENOUS

## 2016-03-12 MED ORDER — SODIUM CHLORIDE 0.9% FLUSH: 3.0000 mL | Freq: Two times a day (BID) | INTRAVENOUS | Status: DC

## 2016-03-12 MED ORDER — SODIUM CHLORIDE 0.9 % WEIGHT BASED INFUSION: 1.0000 mL/kg/h | INTRAVENOUS | Status: DC

## 2016-03-12 MED ORDER — VITAMIN B-1 100 MG PO TABS
100.0000 mg | ORAL_TABLET | Freq: Every day | ORAL | Status: DC
  Administered 2016-03-12 – 2016-03-15 (×3): 100 mg via ORAL
  Filled 2016-03-12 (×3): qty 1

## 2016-03-12 MED ORDER — GI COCKTAIL ~~LOC~~
30.0000 mL | Freq: Once | ORAL | Status: AC
  Administered 2016-03-12: 30 mL via ORAL
  Filled 2016-03-12: qty 30

## 2016-03-12 MED ORDER — LORAZEPAM 0.5 MG PO TABS: 1.0000 mg | ORAL_TABLET | Freq: Four times a day (QID) | ORAL | Status: DC | PRN

## 2016-03-12 MED ORDER — ASPIRIN 81 MG PO CHEW
81.0000 mg | CHEWABLE_TABLET | ORAL | Status: AC
  Administered 2016-03-14: 81 mg via ORAL
  Filled 2016-03-12: qty 1

## 2016-03-12 MED ORDER — ASPIRIN EC 81 MG PO TBEC
81.0000 mg | DELAYED_RELEASE_TABLET | Freq: Every day | ORAL | Status: DC
  Administered 2016-03-15: 09:00:00 81 mg via ORAL
  Filled 2016-03-12: qty 1

## 2016-03-12 NOTE — Consult Note (Addendum)
Primary cardiologist: New  HPI: 60 year old male with past medical history of hypertension, cocaine abuse,, alcohol abuse with non-ST elevation myocardial infarction.  No prior cardiac history. Typically does not have dyspnea on exertion, orthopnea, PND, pedal edema, syncope or exertional chest pain. While carrying his blower this morning he developed chest pain described as a burning sensation. Substernal in location. No radiation.  No associated symptoms.  Initially lasted 15 minutes but returned. Now pain free. Cardiology asked to evaluate. Note patient did cocaine last night.   (Not in a hospital admission)  Allergies  Allergen Reactions  . Other Itching    Sleeping pills    Past Medical History  Diagnosis Date  . Hypertension     Past Surgical History  Procedure Laterality Date  . No prior surgery      Social History   Social History  . Marital Status: Legally Separated    Spouse Name: N/A  . Number of Children: 8  . Years of Education: N/A   Occupational History  . Not on file.   Social History Main Topics  . Smoking status: Current Every Day Smoker -- 0.50 packs/day    Types: Cigarettes  . Smokeless tobacco: Not on file  . Alcohol Use: 0.0 oz/week    0 Standard drinks or equivalent per week     Comment: pint liquor daily  . Drug Use: Yes     Comment: cocaine  . Sexual Activity: Not on file   Other Topics Concern  . Not on file   Social History Narrative    Family History  Problem Relation Age of Onset  . Heart attack Father   . Heart attack Sister     ROS:  no fevers or chills, productive cough, hemoptysis, dysphasia, odynophagia, melena, hematochezia, dysuria, hematuria, rash, seizure activity, orthopnea, PND, pedal edema, claudication. Remaining systems are negative.  Physical Exam:   Blood pressure 127/93, pulse 54, temperature 98.5 F (36.9 C), temperature source Oral, resp. rate 16, height _0  (1.651 m), weight 170 lb (77.111 kg), SpO2  96 %.  General:  Well developed/well nourished in NAD Skin warm/dry Patient not depressed No peripheral clubbing Back-normal HEENT-normal/normal eyelids Neck supple/normal carotid upstroke bilaterally; no bruits; no JVD; no thyromegaly chest - CTA/ normal expansion CV - RRR/normal S1 and S2; no murmurs, rubs or gallops;  PMI nondisplaced Abdomen -NT/ND, no HSM, no mass, + bowel sounds, no bruit 2+ femoral pulses, no bruits Ext-no edema, chords, 2+ DP Neuro-grossly nonfocal  ECG Normal sinus rhythm, right axis deviation, nonspecific ST changes.  Results for orders placed or performed during the hospital encounter of 03/12/16 (from the past 48 hour(s))  Basic metabolic panel     Status: None   Collection Time: 03/12/16  8:56 AM  Result Value Ref Range   Sodium 140 135 - 145 mmol/L   Potassium 4.6 3.5 - 5.1 mmol/L    Comment: HEMOLYSIS AT THIS LEVEL MAY AFFECT RESULT   Chloride 106 101 - 111 mmol/L   CO2 24 22 - 32 mmol/L   Glucose, Bld 99 65 - 99 mg/dL   BUN 9 6 - 20 mg/dL   Creatinine, Ser 1.02 0.61 - 1.24 mg/dL   Calcium 9.1 8.9 - 10.3 mg/dL   GFR calc non Af Amer >60 >60 mL/min   GFR calc Af Amer >60 >60 mL/min    Comment: (NOTE) The eGFR has been calculated using the CKD EPI equation. This calculation has not been validated in all clinical  situations. eGFR's persistently <60 mL/min signify possible Chronic Kidney Disease.    Anion gap 10 5 - 15  CBC     Status: None   Collection Time: 03/12/16  8:56 AM  Result Value Ref Range   WBC 5.0 4.0 - 10.5 K/uL   RBC 5.02 4.22 - 5.81 MIL/uL   Hemoglobin 15.9 13.0 - 17.0 g/dL   HCT 46.9 39.0 - 52.0 %   MCV 93.4 78.0 - 100.0 fL   MCH 31.7 26.0 - 34.0 pg   MCHC 33.9 30.0 - 36.0 g/dL   RDW 14.2 11.5 - 15.5 %   Platelets 210 150 - 400 K/uL  Troponin I     Status: Abnormal   Collection Time: 03/12/16  8:56 AM  Result Value Ref Range   Troponin I 0.14 (HH) <0.03 ng/mL    Comment: CRITICAL RESULT CALLED TO, READ BACK BY AND  VERIFIED WITH: K.REID,RN [id number] 03/12/16 CLARK,S   I-stat troponin, ED     Status: Abnormal   Collection Time: 03/12/16  9:22 AM  Result Value Ref Range   Troponin i, poc 0.20 (HH) 0.00 - 0.08 ng/mL   Comment 3            Comment: Due to the release kinetics of cTnI, a negative result within the first hours of the onset of symptoms does not rule out myocardial infarction with certainty. If myocardial infarction is still suspected, repeat the test at appropriate intervals.     Dg Chest 2 View  03/12/2016  CLINICAL DATA:  Central burning sensation EXAM: CHEST  2 VIEW COMPARISON:  07/08/2015 FINDINGS: The heart size and mediastinal contours are within normal limits. Both lungs are clear. The visualized skeletal structures are unremarkable. Nipple shadows are noted bilaterally. IMPRESSION: No active cardiopulmonary disease. Electronically Signed   By: Inez Catalina M.D.   On: 03/12/2016 09:55    Assessment/Plan 1 non-ST elevation myocardial infarction-the patient's symptoms are concerning. He also did cocaine last evening. His initial troponin is mildly abnormal.  Plan aspirin, heparin and nitroglycerin. I will not add a beta blocker given recent cocaine use. He will require cardiac catheterization. The risks and benefits including myocardial infarction, CVA and death were discussed and he agrees to proceed. We will plan on Monday or sooner if he develops recurrent symptoms. Would admit to CCU. Check echo for LV function.  2 hypertension-hold diuretic. Follow blood pressure and adjust regimen as needed. 3 Cocaine abuse-patient counseled on discontinuing. 4 tobacco abuse-patient counseled on discontinuing. 5 alcohol abuse-follow for signs of withdrawal.  Kirk Ruths MD 03/12/2016, 12:18 PM

## 2016-03-12 NOTE — H&P (Signed)
Date: 03/12/2016               Patient Name:  Lucas Stark MRN: [id number]  DOB: 11-24-1955 Age / Sex: 60 y.o., male   PCP: Provider Default, MD         Medical Service: Internal Medicine Teaching Service         Attending Physician: Dr. Oval Linsey, MD    First Contact: Dr. Lorella Nimrod Pager: [phone]  Second Contact: Dr. Albin Felling Pager: [phone]       After Hours (After 5p/  First Contact Pager: [phone]  weekends / holidays): Second Contact Pager: [phone]   Chief Complaint: Substernal chest pain  History of Present Illness: 60 y.o man with H/O hypertension , tobacco , alcohol and cocaine abuse  presents with a 1 day hx of chest pain . According to pt. He was getting ready to go to work this morning and while putting his lawn mover in his truck, he felt substernal chest pain, non radiating,burning , about 7-8 in intensity. Not ass. With dyspnea, nausea, vomiting or palpitation.His pain resolved after 10 minutes of lying down. He called his mother about the incident and then called EMS according to her advice. He has a H/O of hypertension since one year, stopped his diuretic about a month and didn't follow up. Has No H/O Exertional dyspnea, PND or chest pain. Never had these symptoms before.He had cocaine a night before.   Meds: Current Facility-Administered Medications  Medication Dose Route Frequency Provider Last Rate Last Dose  . heparin ADULT infusion 100 units/mL (25000 units/211mL sodium chloride 0.45%)  950 Units/hr Intravenous Continuous Rolla Flatten, RPH 9.5 mL/hr at 03/12/16 1113 950 Units/hr at 03/12/16 1113   Current Outpatient Prescriptions  Medication Sig Dispense Refill  . ibuprofen (ADVIL,MOTRIN) 200 MG tablet Take 400 mg by mouth every 6 (six) hours as needed for moderate pain.    . meloxicam (MOBIC) 7.5 MG tablet Take 7.5 mg by mouth daily.    Marland Kitchen triamterene-hydrochlorothiazide (DYAZIDE) 37.5-25 MG capsule Take 1 capsule by mouth daily.    .  methocarbamol (ROBAXIN) 500 MG tablet Take 1 tablet (500 mg total) by mouth 2 (two) times daily as needed for muscle spasms. (Patient not taking: Reported on 03/12/2016) 10 tablet 0  . traMADol (ULTRAM) 50 MG tablet Take 1 tablet (50 mg total) by mouth every 6 (six) hours as needed. (Patient not taking: Reported on 03/12/2016) 12 tablet 0    Allergies: Allergies as of 03/12/2016 - Review Complete 03/12/2016  Allergen Reaction Noted  . Other Itching 07/08/2015   Past Medical History  Diagnosis Date  . Hypertension    Past Surgical History: No prior surgeries Family History:  Heart disease in his father and sister and they both died in their sleep, dad in his mid 49es and sister in 66es.  Social History: Legally separated  Has a girl friend Have 8 children, all healthy Smoker 1pack/day since last 35-40 years Alcohol intake daily, about a pint of gin. Last intake a night before at 11:00pm Cocain : occasionally, last intake previous night  Review of Systems: A complete ROS was negative except as per HPI.   Physical Exam: Blood pressure 139/93, pulse 61, temperature 98.5 F (36.9 C), temperature source Oral, resp. rate 20, height 5\' 5"  (1.651 m), weight 170 lb (77.111 kg), SpO2 100 %. Well built male, lying comfortably in bed. EYES:PERRL EENT:Normal NECK:Supple, no thyromegaly CHEST: Non tender, Breath sounds normal , no  rales or wheezing CVS: S1/S2, no murmer, rubs or gallops. ABD: Soft, non tender,not distended. No organomegaly. BS +ve NEURO: Alert, oriented and neuro grossly intact. EXTREMITIES: warm, moist,no oedema, Periferal pulses symmetrical bilaterally.  EKG: Shows normal sinus rhythm, right axis deviation and nonspecific ST changes.    Assessment & Plan by Problem:  NSTEMI:60 y.o. man with H/O uncontrolled hypertension, tobacco, alcohol and cocaine abuse came with c/o chest pain. Although his EKG doesn't show any ST elevation, his first set of Troponin is mildly  elevated at 0.14 and with concerning symptoms and H/O cocaine taken a night before cardiology was consulted. Planning to do cardiac catheterization on Monday or earlier if the condition change. Monitor three sets of Troponin to see the trend. Heparin bolus 4000 units,  followed by Heparin infusion 100 units/ml Nitroglycerine 50 mg 5% dextrose 250 ml Telemonitor  Pulse oximetry  HYPERTENSION: Hold his home diuretic due to nonspecific T wave changes and right axis deviation on his EKG.and monitor his BP. ACEI or beta blocker can started if he had hypertension.  COCAIN ABUSE: Counsel as that can be a contributory factor for his current condition.  ALCOHOL ABUSE; CIWA protocol and counsel  TOBACCO ABUSE: Need to counsel about quiting.  DIET: Cardiac diet  CODE STATUS: Full code  Dispo: Admit patient to step down for monitoring. Expected discharge in 2-3 days. Signed: Lorella Nimrod, MD 03/12/2016, 1:09 PM  Pager: [phone]

## 2016-03-12 NOTE — ED Notes (Signed)
Pt c/o woke up with center chest burning sensation. Pt ate chinese food last night and had ETOH intake. Pt took ibuprofen for the pain without relief. Pt given 324 mg of ASA and 1 Nitro by EMS without relief.

## 2016-03-12 NOTE — ED Provider Notes (Signed)
CSN: [id number]     Arrival date & time 03/12/16  [id number] History   First MD Initiated Contact with Patient 03/12/16 [phone]     Chief Complaint  Patient presents with  . Chest Pain     (Consider location/radiation/quality/duration/timing/severity/associated sxs/prior Treatment) HPI Lucas Stark is a 60 y.o. male with history of hypertension, current cigarette smoker family history of heart disease presents for evaluation of chest pain. He reports approximately 7:00 AM he began to experience a "stinging" pain in his anterior mid chest. Discomfort constant since that time. Denies any associated shortness of breath, diaphoresis, nausea or vomiting, numbness or weakness or radiation of discomfort. He was given 4 mg aspirin and 1 sublingual nitroglycerin by EMS without relief of his symptoms. He does admit to eating [place] food and "half a pint of liquor" last night. No fevers, chills, cough, leg swelling or history of DVT. Discomfort is not relieved with ibuprofen.  Past Medical History  Diagnosis Date  . Hypertension    Past Surgical History  Procedure Laterality Date  . No prior surgery     Family History  Problem Relation Age of Onset  . Heart attack Father   . Heart attack Sister    Social History  Substance Use Topics  . Smoking status: Current Every Day Smoker -- 1.00 packs/day for 35 years    Types: Cigarettes  . Smokeless tobacco: None  . Alcohol Use: 0.0 oz/week    0 Standard drinks or equivalent per week     Comment: pint liquor (gin) daily    Review of Systems A 10 point review of systems was completed and was negative except for pertinent positives and negatives as mentioned in the history of present illness     Allergies  Other  Home Medications   Prior to Admission medications   Medication Sig Start Date End Date Taking? Authorizing Provider  ibuprofen (ADVIL,MOTRIN) 200 MG tablet Take 400 mg by mouth every 6 (six) hours as needed for moderate pain.   Yes  Historical Provider, MD  meloxicam (MOBIC) 7.5 MG tablet Take 7.5 mg by mouth daily.   Yes Historical Provider, MD  triamterene-hydrochlorothiazide (DYAZIDE) 37.5-25 MG capsule Take 1 capsule by mouth daily.   Yes Historical Provider, MD  methocarbamol (ROBAXIN) 500 MG tablet Take 1 tablet (500 mg total) by mouth 2 (two) times daily as needed for muscle spasms. Patient not taking: Reported on 03/12/2016 10/12/15   Virgel Manifold, MD  traMADol (ULTRAM) 50 MG tablet Take 1 tablet (50 mg total) by mouth every 6 (six) hours as needed. Patient not taking: Reported on 03/12/2016 10/12/15   Virgel Manifold, MD   BP 131/79 mmHg  Pulse 53  Temp(Src) 98.5 F (36.9 C) (Oral)  Resp 20  Ht 5\' 5"  (1.651 m)  Wt 77.111 kg  BMI 28.29 kg/m2  SpO2 97% Physical Exam  Constitutional: He is oriented to person, place, and time. He appears well-developed and well-nourished.  HENT:  Head: Normocephalic and atraumatic.  Mouth/Throat: Oropharynx is clear and moist.  Eyes: Conjunctivae are normal. Pupils are equal, round, and reactive to light. Right eye exhibits no discharge. Left eye exhibits no discharge. No scleral icterus.  Neck: Neck supple.  Cardiovascular: Normal rate, regular rhythm and normal heart sounds.   Pulmonary/Chest: Effort normal and breath sounds normal. No respiratory distress. He has no wheezes. He has no rales.  Abdominal: Soft. There is no tenderness.  Musculoskeletal: He exhibits no tenderness.  Neurological: He is alert and oriented  to person, place, and time.  Cranial Nerves II-XII grossly intact  Skin: Skin is warm and dry. No rash noted.  Psychiatric: He has a normal mood and affect.  Nursing note and vitals reviewed.   ED Course  Procedures (including critical care time) Labs Review Labs Reviewed  TROPONIN I - Abnormal; Notable for the following:    Troponin I 0.14 (*)    All other components within normal limits  TROPONIN I - Abnormal; Notable for the following:    Troponin I  0.42 (*)    All other components within normal limits  LIPID PANEL - Abnormal; Notable for the following:    LDL Cholesterol 113 (*)    All other components within normal limits  I-STAT TROPOININ, ED - Abnormal; Notable for the following:    Troponin i, poc 0.20 (*)    All other components within normal limits  BASIC METABOLIC PANEL  CBC  HEPARIN LEVEL (UNFRACTIONATED)  TROPONIN I  TROPONIN I  HEMOGLOBIN A1C    Imaging Review Dg Chest 2 View  03/12/2016  CLINICAL DATA:  Central burning sensation EXAM: CHEST  2 VIEW COMPARISON:  07/08/2015 FINDINGS: The heart size and mediastinal contours are within normal limits. Both lungs are clear. The visualized skeletal structures are unremarkable. Nipple shadows are noted bilaterally. IMPRESSION: No active cardiopulmonary disease. Electronically Signed   By: Inez Catalina M.D.   On: 03/12/2016 09:55   I have personally reviewed and evaluated these images and lab results as part of my medical decision-making.   EKG Interpretation   Date/Time:  Saturday March 12 2016 08:53:45 EDT Ventricular Rate:  74 PR Interval:    QRS Duration: 92 QT Interval:  409 QTC Calculation: 454 R Axis:   100 Text Interpretation:  Sinus rhythm Right axis deviation Borderline  repolarization abnormality No significant change since last tracing  Confirmed by Canary Brim  MD, MARTHA [phone]) on 03/12/2016 11:12:04 AM     CRITICAL CARE Performed by: Verl Dicker   Total critical care time: 30 minutes  Critical care time was exclusive of separately billable procedures and treating other patients.  Critical care was necessary to treat or prevent imminent or life-threatening deterioration.  Critical care was time spent personally by me on the following activities: development of treatment plan with patient and/or surrogate as well as nursing, discussions with consultants, evaluation of patient's response to treatment, examination of patient, obtaining history from  patient or surrogate, ordering and performing treatments and interventions, ordering and review of laboratory studies, ordering and review of radiographic studies, pulse oximetry and re-evaluation of patient's condition.  MDM  60 year old African-American male, heart score 4, stinging central chest pain onset this morning at 7 AM. Originally not resolved with nitroglycerin via EMS. He received 324 mg aspirin en route. No chest pain now in emergency department. EKG without ischemic changes. I-STAT troponin elevated at 0.20. Chest x-ray unremarkable. Plan to consult cardiology. Discussed with cardiology, Dr. Stanford Breed, recommends obtaining Troponin I. Repeat troponin is elevated at 0.14. Discussed with Dr. Stanford Breed, recommends admission to medical service and they will consult. Initiated heparin and nitroglycerin drip. Discussed with Dr. Arcelia Jew. Pt admitted to medical service. Final diagnoses:  Chest pain, unspecified chest pain type  NSTEMI (non-ST elevated myocardial infarction) [facility])        Comer Locket, PA-C 03/12/16 [address] PA-C 03/12/16 [place], MD 03/12/16 1459

## 2016-03-12 NOTE — ED Notes (Signed)
MD at bedside. 

## 2016-03-12 NOTE — Progress Notes (Addendum)
[place] for Heparin Indication: chest pain/ACS/NSTEMI  Allergies  Allergen Reactions  . Other Itching    Sleeping pills    Patient Measurements: Height: 5\' 5"  (165.1 cm) Weight: 169 lb (76.658 kg) IBW/kg (Calculated) : 61.5 Heparin Dosing Weight: 77 kg  Vital Signs: Temp: 98.4 F (36.9 C) (07/01 1455) Temp Source: Oral (07/01 1455) BP: 136/83 mmHg (07/01 1455) Pulse Rate: 53 (07/01 1415)  Labs:  Recent Labs  03/12/16 0856 03/12/16 1312 03/12/16 1805  HGB 15.9  --   --   HCT 46.9  --   --   PLT 210  --   --   HEPARINUNFRC  --   --  0.26*  CREATININE 1.02  --   --   TROPONINI 0.14* 0.42*  --     Estimated Creatinine Clearance: 75.5 mL/min (by C-G formula based on Cr of 1.02).   Medical History: Past Medical History  Diagnosis Date  . Hypertension     Assessment: 16 YOM who presented to the [facility] on 7[facility]1 with CP and bump in troponins. Pharmacy consulted to start heparin for NSTEMI.   Baseline CBC wnl. Initial heparin level just below goal at 0.26units/mL. No bleeding noted. Confirmed with RN Merrily Pew there had been no issues with line or pauses to infusion.  Goal of Therapy:  Heparin level 0.3-0.7 units/ml Monitor platelets by anticoagulation protocol: Yes   Plan:  -Increase heparin drip to 1050 units/hr (10.5 ml/hr) -Daily HL and CBC -Will continue to monitor for any signs/symptoms of bleeding   Makalah Asberry D. Blakleigh Straw, PharmD, BCPS Clinical Pharmacist Pager: [phone] 03/12/2016 6:49 PM

## 2016-03-12 NOTE — Progress Notes (Signed)
ANTICOAGULATION CONSULT NOTE - Initial Consult  Pharmacy Consult for Heparin Indication: chest pain/ACS/NSTEMI  Allergies  Allergen Reactions  . Other Itching    Sleeping pills    Patient Measurements: Height: 5\' 5"  (165.1 cm) Weight: 170 lb (77.111 kg) IBW/kg (Calculated) : 61.5 Heparin Dosing Weight: 77 kg  Vital Signs: Temp: 98.5 F (36.9 C) (07/01 0903) Temp Source: Oral (07/01 0903) BP: 120/82 mmHg (07/01 1015) Pulse Rate: 57 (07/01 1015)  Labs:  Recent Labs  03/12/16 0856  HGB 15.9  HCT 46.9  PLT 210  CREATININE 1.02    Estimated Creatinine Clearance: 75.6 mL/min (by C-G formula based on Cr of 1.02).   Medical History: Past Medical History  Diagnosis Date  . Hypertension     Assessment: 72 YOM who presented to the [facility] on 7/1 with CP and bump in troponins. Pharmacy consulted to start heparin for NSTEMI.   Baseline CBC wnl. The patient was noted to be on NSAIDs PTA but no other anticoagulants. No recent surgeries or hx CVA noted.   Goal of Therapy:  Heparin level 0.3-0.7 units/ml Monitor platelets by anticoagulation protocol: Yes   Plan:  1. Heparin bolus of 4000 units x 1 2. Start heparin drip at a rate of 950 units/hr (9.5 ml/hr) 3. Will continue to monitor for any signs/symptoms of bleeding and will follow up with heparin level in 6 hours   Alycia Rossetti, PharmD, BCPS Clinical Pharmacist Pager: [phone] 03/12/2016 10:31 AM

## 2016-03-13 ENCOUNTER — Other Ambulatory Visit (HOSPITAL_COMMUNITY): Payer: Self-pay

## 2016-03-13 LAB — BASIC METABOLIC PANEL
Anion gap: 5 (ref 5–15)
BUN: 12 mg/dL (ref 6–20)
CHLORIDE: 107 mmol/L (ref 101–111)
CO2: 26 mmol/L (ref 22–32)
CREATININE: 1 mg/dL (ref 0.61–1.24)
Calcium: 8.3 mg/dL — ABNORMAL LOW (ref 8.9–10.3)
GFR calc non Af Amer: >60 mL/min (ref >60)
Glucose, Bld: 102 mg/dL — ABNORMAL HIGH (ref 65–99)
POTASSIUM: 3.7 mmol/L (ref 3.5–5.1)
SODIUM: 138 mmol/L (ref 135–145)

## 2016-03-13 LAB — CBC
HEMATOCRIT: 43.2 % (ref 39.0–52.0)
HEMOGLOBIN: 14.7 g/dL (ref 13.0–17.0)
MCH: 31.5 pg (ref 26.0–34.0)
MCHC: 34 g/dL (ref 30.0–36.0)
MCV: 92.5 fL (ref 78.0–100.0)
PLATELETS: 190 10*3/uL (ref 150–400)
RBC: 4.67 MIL/uL (ref 4.22–5.81)
RDW: 14 % (ref 11.5–15.5)
WBC: 4.9 10*3/uL (ref 4.0–10.5)

## 2016-03-13 LAB — TROPONIN I
Troponin I: 0.86 ng/mL (ref <0.03)
Troponin I: 0.43 ng/mL (ref <0.03)

## 2016-03-13 LAB — HEPARIN LEVEL (UNFRACTIONATED)
HEPARIN UNFRACTIONATED: 0.19 [IU]/mL — AB (ref 0.30–0.70)
Heparin Unfractionated: 0.23 IU/mL — ABNORMAL LOW (ref 0.30–0.70)
Heparin Unfractionated: 0.49 IU/mL (ref 0.30–0.70)

## 2016-03-13 MED ORDER — NICOTINE 14 MG/24HR TD PT24
14.0000 mg | MEDICATED_PATCH | Freq: Every day | TRANSDERMAL | Status: DC
  Administered 2016-03-13 – 2016-03-15 (×3): 14 mg via TRANSDERMAL
  Filled 2016-03-13 (×3): qty 1

## 2016-03-13 MED ORDER — HEPARIN (PORCINE) IN NACL 100-0.45 UNIT/ML-% IJ SOLN
1350.0000 [IU]/h | INTRAMUSCULAR | Status: DC
  Administered 2016-03-14: 1350 [IU]/h via INTRAVENOUS
  Filled 2016-03-13: qty 250

## 2016-03-13 MED ORDER — ATORVASTATIN CALCIUM 40 MG PO TABS
40.0000 mg | ORAL_TABLET | Freq: Every day | ORAL | Status: DC
  Administered 2016-03-13 – 2016-03-14 (×2): 40 mg via ORAL
  Filled 2016-03-13 (×2): qty 1

## 2016-03-13 NOTE — Progress Notes (Signed)
[place] for Heparin Indication: chest pain/ACS/NSTEMI  Allergies  Allergen Reactions  . Other Itching    Sleeping pills    Patient Measurements: Height: 5\' 5"  (165.1 cm) Weight: 165 lb 3.2 oz (74.934 kg) IBW/kg (Calculated) : 61.5 Heparin Dosing Weight: 77 kg  Vital Signs: Temp: 98.9 F (37.2 C) (07/02 2108) Temp Source: Oral (07/02 1554) BP: 128/80 mmHg (07/02 2108) Pulse Rate: 66 (07/02 1554)  Labs:  Recent Labs  03/12/16 0856  03/12/16 1805 03/13/16 0032 03/13/16 0039 03/13/16 0608 03/13/16 1309 03/13/16 2112  HGB 15.9  --   --   --  14.7  --   --   --   HCT 46.9  --   --   --  43.2  --   --   --   PLT 210  --   --   --  190  --   --   --   HEPARINUNFRC  --   < > 0.26*  --  0.23*  --  0.19* 0.49  CREATININE 1.02  --   --   --  1.00  --   --   --   TROPONINI 0.14*  < > 0.77* 0.86*  --  0.43*  --   --   < > = values in this interval not displayed.  Estimated Creatinine Clearance: 76.2 mL/min (by C-G formula based on Cr of 1).   Medical History: Past Medical History  Diagnosis Date  . Hypertension     Assessment: 7 YOM who presented to the [facility] on 7/1 with CP and bump in troponins. Pharmacy consulted to start heparin for NSTEMI.  Heparin level now in range at 0.49 units/mL after several rate increases. No bleeding noted.  Goal of Therapy:  Heparin level 0.3-0.7 units/ml Monitor platelets by anticoagulation protocol: Yes   Plan:  -Continue heparin drip at 1450 units/hr  -Confirmatory level with AM labs -Daily HL and CBC -Will continue to monitor for any signs/symptoms of bleeding    Antania Hoefling D. Lynnetta Tom, PharmD, BCPS Clinical Pharmacist Pager: [phone] 03/13/2016 10:01 PM

## 2016-03-13 NOTE — Progress Notes (Signed)
   Subjective: Feeling ok, no c/o chest pain, SOB. Pt. Is willing to quit smoking and his illicit drug use. Pt. Was not happy with his cardiac diet, was advised not to eat any fast food as they will be harm full. Objective: Vital signs in last 24 hours: Filed Vitals:   03/13/16 0400 03/13/16 0500 03/13/16 0828 03/13/16 1147  BP: 120/73  120/74 127/83  Pulse:   51 50  Temp:   98.4 F (36.9 C) 98.6 F (37 C)  TempSrc:   Oral Oral  Resp: 19   17  Height:      Weight:  165 lb 3.2 oz (74.934 kg)    SpO2:   97% 97%   Physical Exam Well built male, lying comfortably in bed. EYES:PERRL EENT:Normal NECK:Supple, no thyromegaly CHEST: Non tender, Breath sounds normal , no rales or wheezing CVS: S1/S2, no murmer, rubs or gallops. ABD: Soft, non tender,not distended. No organomegaly. BS +ve NEURO: Alert, oriented and neuro grossly intact. EXTREMITIES: warm, moist,no oedema, Periferal pulses symmetrical bilaterally.  LABS: Only positive for upward trending troponin, last one was 0.86 Heparin levels. 0.23 which are sub optimal  Pharmacy increase his dose.  MEDS: Reviewed    Assessment/Plan:   1:NSTEMI:60 y.o. man with H/O uncontrolled hypertension, tobacco, alcohol and cocaine abuse came with c/o chest pain. Although his EKG doesn't show any ST elevation, his first set of Troponin is mildly elevated at 0.14 and with up ward trend later on and concerning symptoms and H/O cocaine taken a night before cardiology was consulted. Planning to do cardiac catheterization on Monday or earlier if the condition change. Avoid Beta blocker at this time due to his H/O Cocaine abuse. Heparin bolus 4000 units, followed by Heparin infusion 100 units/ml/hr Nitroglycerine 50 mg 5% dextrose 250 ml Telemonitor  Pulse oximetry  HYPERTENSION: Hold his home diuretic due to nonspecific T wave changes and right axis deviation on his EKG.and monitor his BP. ACEI or beta blocker can started if he had  hypertension.  COCAIN ABUSE: Counsel as that can be a contributory factor for his current condition.  ALCOHOL ABUSE; CIWA protocol and counsel  TOBACCO ABUSE:  He is willing to stop and requested a nicotine patch, which was ordered.  DIET: Cardiac diet  CODE STATUS: Full code Dispo: Anticipated discharge in approximately 2-3 day(s). Depending on his cardiac cath. Report.  LOS: 1 day   Lorella Nimrod, MD 03/13/2016, 12:27 PM Pager: [phone]

## 2016-03-13 NOTE — Progress Notes (Signed)
ANTICOAGULATION CONSULT NOTE - Follow Up Consult  Pharmacy Consult for heparin Indication: NSTEMI  Labs:  Recent Labs  03/12/16 0856 03/12/16 1312 03/12/16 1805 03/13/16 0032 03/13/16 0039  HGB 15.9  --   --   --  14.7  HCT 46.9  --   --   --  43.2  PLT 210  --   --   --  190  HEPARINUNFRC  --   --  0.26*  --  0.23*  CREATININE 1.02  --   --   --  1.00  TROPONINI 0.14* 0.42* 0.77* 0.86*  --     Assessment: 60yo male remains subtherapeutic on heparin with lower level despite rate increase.  Goal of Therapy:  Heparin level 0.3-0.7 units/ml   Plan:  Will increase heparin gtt by 2 units/kg/hr to 1200 units/hr and check level in 6hr.  Wynona Neat, PharmD, BCPS  03/13/2016,6:49 AM

## 2016-03-13 NOTE — Progress Notes (Signed)
    Subjective:  Denies CP or dyspnea   Objective:  Filed Vitals:   03/13/16 0045 03/13/16 0357 03/13/16 0400 03/13/16 0500  BP:  120/73 120/73   Pulse:  50    Temp:  98.1 F (36.7 C)    TempSrc:  Oral    Resp:  21 19   Height:      Weight: 165 lb 3.2 oz (74.934 kg)   165 lb 3.2 oz (74.934 kg)  SpO2:  96%      Intake/Output from previous day:  Intake/Output Summary (Last 24 hours) at 03/13/16 0757 Last data filed at 03/12/16 2200  Gross per 24 hour  Intake    483 ml  Output      0 ml  Net    483 ml    Physical Exam: Physical exam: Well-developed well-nourished in no acute distress.  Skin is warm and dry.  HEENT is normal.  Neck is supple.  Chest is clear to auscultation with normal expansion.  Cardiovascular exam is regular rate and rhythm.  Abdominal exam nontender or distended. No masses palpated. Extremities show no edema. neuro grossly intact    Lab Results: Basic Metabolic Panel:  Recent Labs  03/12/16 0856 03/13/16 0039  NA 140 138  K 4.6 3.7  CL 106 107  CO2 24 26  GLUCOSE 99 102*  BUN 9 12  CREATININE 1.02 1.00  CALCIUM 9.1 8.3*   CBC:  Recent Labs  03/12/16 0856 03/13/16 0039  WBC 5.0 4.9  HGB 15.9 14.7  HCT 46.9 43.2  MCV 93.4 92.5  PLT 210 190   Cardiac Enzymes:  Recent Labs  03/12/16 1805 03/13/16 0032 03/13/16 0608  TROPONINI 0.77* 0.86* 0.43*     Assessment/Plan:  1 non-ST elevation myocardial infarction-in the setting of cocaine use Friday PM. Troponins mildly elevated. Continue aspirin, heparin and nitroglycerin. I have not added a beta blocker given recent cocaine use. Plan cardiac catheterization in AM. The risks and benefits including myocardial infarction, CVA and death were discussed and he agrees to proceed. Await echo for LV function.  2 hypertension-controlled; continue present meds and follow. 3 Cocaine abuse-patient counseled on discontinuing. 4 tobacco abuse-patient counseled on discontinuing. 5  alcohol abuse-follow for signs of withdrawal.  Kirk Ruths 03/13/2016, 7:57 AM

## 2016-03-13 NOTE — Progress Notes (Signed)
[place] for Heparin Indication: chest pain/ACS/NSTEMI  Allergies  Allergen Reactions  . Other Itching    Sleeping pills    Patient Measurements: Height: 5\' 5"  (165.1 cm) Weight: 165 lb 3.2 oz (74.934 kg) IBW/kg (Calculated) : 61.5 Heparin Dosing Weight: 77 kg  Vital Signs: Temp: 98.6 F (37 C) (07/02 1147) Temp Source: Oral (07/02 1147) BP: 127/83 mmHg (07/02 1147) Pulse Rate: 50 (07/02 1147)  Labs:  Recent Labs  03/12/16 0856  03/12/16 1805 03/13/16 0032 03/13/16 0039 03/13/16 0608 03/13/16 1309  HGB 15.9  --   --   --  14.7  --   --   HCT 46.9  --   --   --  43.2  --   --   PLT 210  --   --   --  190  --   --   HEPARINUNFRC  --   --  0.26*  --  0.23*  --  0.19*  CREATININE 1.02  --   --   --  1.00  --   --   TROPONINI 0.14*  < > 0.77* 0.86*  --  0.43*  --   < > = values in this interval not displayed.  Estimated Creatinine Clearance: 76.2 mL/min (by C-G formula based on Cr of 1).   Medical History: Past Medical History  Diagnosis Date  . Hypertension     Assessment: 75 YOM who presented to the [facility] on 7/1 with CP and bump in troponins. Pharmacy consulted to start heparin for NSTEMI. Heparin level remains SUBtherapeutic at 0.19 despite rate increase. No bleeding noted and CBC remains wnl and stable. Confirmed with RN there had been no issues with line or pauses to infusion.  Goal of Therapy:  Heparin level 0.3-0.7 units/ml Monitor platelets by anticoagulation protocol: Yes   Plan:  -Increase heparin drip to 1450 units/hr  -6 hr HL -Daily HL and CBC -Will continue to monitor for any signs/symptoms of bleeding     Alanah Sakuma K. Velva Harman, PharmD, BCPS, CPP Clinical Pharmacist Pager: [phone] Phone: [phone] 03/13/2016 2:38 PM

## 2016-03-14 ENCOUNTER — Encounter (HOSPITAL_COMMUNITY)
Admission: EM | Disposition: A | Discharge status: Home / Self Care | Payer: Self-pay | Source: Home / Self Care | Attending: Internal Medicine

## 2016-03-14 ENCOUNTER — Encounter (HOSPITAL_COMMUNITY): Payer: Self-pay | Admitting: Cardiovascular Disease

## 2016-03-14 ENCOUNTER — Inpatient Hospital Stay (HOSPITAL_COMMUNITY): Payer: Self-pay

## 2016-03-14 DIAGNOSIS — I252 Old myocardial infarction: Secondary | ICD-10-CM

## 2016-03-14 DIAGNOSIS — R079 Chest pain, unspecified: Secondary | ICD-10-CM

## 2016-03-14 DIAGNOSIS — Z955 Presence of coronary angioplasty implant and graft: Secondary | ICD-10-CM

## 2016-03-14 HISTORY — DX: Old myocardial infarction: I25.2

## 2016-03-14 HISTORY — PX: CARDIAC CATHETERIZATION: SHX172

## 2016-03-14 HISTORY — DX: Presence of coronary angioplasty implant and graft: Z95.5

## 2016-03-14 LAB — ECHOCARDIOGRAM COMPLETE
E decel time: 285 msec
E/e' ratio: 8.21
FS: 20 % — AB (ref 28–44)
Height: 65 in
IVS/LV PW RATIO, ED: 0.77
LA ID, A-P, ES: 31 mm
LA diam end sys: 31 mm
LA vol A4C: 41.9 ml
LA vol index: 23.9 mL/m2
LADIAMINDEX: 1.69 cm/m2
LAVOL: 43.7 mL
LV TDI E'LATERAL: 10.1
LV TDI E'MEDIAL: 7.62
LV dias vol index: 74 mL/m2
LV dias vol: 135 mL (ref 62–150)
LV e' LATERAL: 10.1 cm/s
LV sys vol: 71 mL — AB (ref 21–61)
LVEEAVG: 8.21
LVEEMED: 8.21
LVOT area: 3.46 cm2
LVOT diameter: 21 mm
LVSYSVOLIN: 39 mL/m2
MV Dec: 285
MV Peak grad: 3 mmHg
MVPKAVEL: 49 m/s
MVPKEVEL: 82.9 m/s
PW: 7.62 mm — AB (ref 0.6–1.1)
RV TAPSE: 25.9 mm
Simpson's disk: 48
Stroke v: 64 ml
WEIGHTICAEL: 2649.6 [oz_av]

## 2016-03-14 LAB — PROTIME-INR
INR: 1.07 (ref 0.00–1.49)
PROTHROMBIN TIME: 14.1 s (ref 11.6–15.2)

## 2016-03-14 LAB — POCT ACTIVATED CLOTTING TIME: Activated Clotting Time: 483 seconds

## 2016-03-14 LAB — CBC
HCT: 46.6 % (ref 39.0–52.0)
Hemoglobin: 15.6 g/dL (ref 13.0–17.0)
MCH: 31.1 pg (ref 26.0–34.0)
MCHC: 33.5 g/dL (ref 30.0–36.0)
MCV: 93 fL (ref 78.0–100.0)
PLATELETS: 190 10*3/uL (ref 150–400)
RBC: 5.01 MIL/uL (ref 4.22–5.81)
RDW: 13.8 % (ref 11.5–15.5)
WBC: 5.4 10*3/uL (ref 4.0–10.5)

## 2016-03-14 LAB — HEPARIN LEVEL (UNFRACTIONATED)
HEPARIN UNFRACTIONATED: 0.72 [IU]/mL — AB (ref 0.30–0.70)
Heparin Unfractionated: 0.56 IU/mL (ref 0.30–0.70)

## 2016-03-14 LAB — HEMOGLOBIN A1C
Hgb A1c MFr Bld: 5.7 % — ABNORMAL HIGH (ref 4.8–5.6)
Mean Plasma Glucose: 117 mg/dL

## 2016-03-14 SURGERY — LEFT HEART CATH AND CORONARY ANGIOGRAPHY
Anesthesia: LOCAL

## 2016-03-14 MED ORDER — MIDAZOLAM HCL 2 MG/2ML IJ SOLN
INTRAMUSCULAR | Status: AC
  Filled 2016-03-14: qty 2

## 2016-03-14 MED ORDER — SODIUM CHLORIDE 0.9 % IV SOLN: 250.0000 mL | INTRAVENOUS | Status: DC | PRN

## 2016-03-14 MED ORDER — NITROGLYCERIN 1 MG/10 ML FOR IR/CATH LAB
INTRA_ARTERIAL | Status: DC | PRN
  Administered 2016-03-14 (×2): 200 ug

## 2016-03-14 MED ORDER — HEPARIN SODIUM (PORCINE) 5000 UNIT/ML IJ SOLN
5000.0000 [IU] | Freq: Three times a day (TID) | INTRAMUSCULAR | Status: DC
  Administered 2016-03-14 – 2016-03-15 (×2): 5000 [IU] via SUBCUTANEOUS
  Filled 2016-03-14 (×2): qty 1

## 2016-03-14 MED ORDER — FENTANYL CITRATE (PF) 100 MCG/2ML IJ SOLN
INTRAMUSCULAR | Status: AC
  Filled 2016-03-14: qty 2

## 2016-03-14 MED ORDER — TICAGRELOR 90 MG PO TABS
ORAL_TABLET | ORAL | Status: DC | PRN
  Administered 2016-03-14: 180 mg via ORAL

## 2016-03-14 MED ORDER — FENTANYL CITRATE (PF) 100 MCG/2ML IJ SOLN
INTRAMUSCULAR | Status: DC | PRN
  Administered 2016-03-14: 50 ug via INTRAVENOUS

## 2016-03-14 MED ORDER — LIDOCAINE HCL (PF) 1 % IJ SOLN
INTRAMUSCULAR | Status: DC | PRN
  Administered 2016-03-14: 2 mL

## 2016-03-14 MED ORDER — TICAGRELOR 90 MG PO TABS
ORAL_TABLET | ORAL | Status: AC
  Filled 2016-03-14: qty 2

## 2016-03-14 MED ORDER — LIDOCAINE HCL (PF) 1 % IJ SOLN
INTRAMUSCULAR | Status: AC
  Filled 2016-03-14: qty 30

## 2016-03-14 MED ORDER — NITROGLYCERIN 1 MG/10 ML FOR IR/CATH LAB
INTRA_ARTERIAL | Status: AC
  Filled 2016-03-14: qty 10

## 2016-03-14 MED ORDER — HEPARIN SODIUM (PORCINE) 1000 UNIT/ML IJ SOLN
INTRAMUSCULAR | Status: DC | PRN
  Administered 2016-03-14: 6500 [IU] via INTRAVENOUS
  Administered 2016-03-14: 3500 [IU] via INTRAVENOUS

## 2016-03-14 MED ORDER — VERAPAMIL HCL 2.5 MG/ML IV SOLN
INTRAVENOUS | Status: DC | PRN
  Administered 2016-03-14: 13:00:00 via INTRA_ARTERIAL

## 2016-03-14 MED ORDER — IOPAMIDOL (ISOVUE-370) INJECTION 76%
INTRAVENOUS | Status: DC | PRN
  Administered 2016-03-14: 180 mL via INTRAVENOUS

## 2016-03-14 MED ORDER — TICAGRELOR 90 MG PO TABS
90.0000 mg | ORAL_TABLET | Freq: Two times a day (BID) | ORAL | Status: DC
  Administered 2016-03-15 (×2): 90 mg via ORAL
  Filled 2016-03-14 (×2): qty 1

## 2016-03-14 MED ORDER — HEPARIN (PORCINE) IN NACL 2-0.9 UNIT/ML-% IJ SOLN
INTRAMUSCULAR | Status: AC
  Filled 2016-03-14: qty 1500

## 2016-03-14 MED ORDER — HEPARIN (PORCINE) IN NACL 2-0.9 UNIT/ML-% IJ SOLN
INTRAMUSCULAR | Status: DC | PRN
  Administered 2016-03-14: 1500 mL

## 2016-03-14 MED ORDER — HEPARIN SODIUM (PORCINE) 1000 UNIT/ML IJ SOLN
INTRAMUSCULAR | Status: AC
  Filled 2016-03-14: qty 1

## 2016-03-14 MED ORDER — VERAPAMIL HCL 2.5 MG/ML IV SOLN
INTRAVENOUS | Status: AC
  Filled 2016-03-14: qty 2

## 2016-03-14 MED ORDER — SODIUM CHLORIDE 0.9% FLUSH: 3.0000 mL | INTRAVENOUS | Status: DC | PRN

## 2016-03-14 MED ORDER — SODIUM CHLORIDE 0.9 % IV SOLN
INTRAVENOUS | Status: DC
  Administered 2016-03-14: 14:00:00 via INTRAVENOUS

## 2016-03-14 MED ORDER — IOPAMIDOL (ISOVUE-370) INJECTION 76%
INTRAVENOUS | Status: AC
  Filled 2016-03-14: qty 100

## 2016-03-14 MED ORDER — IOPAMIDOL (ISOVUE-370) INJECTION 76%
INTRAVENOUS | Status: AC
  Filled 2016-03-14: qty 50

## 2016-03-14 MED ORDER — SODIUM CHLORIDE 0.9% FLUSH
3.0000 mL | Freq: Two times a day (BID) | INTRAVENOUS | Status: DC
  Administered 2016-03-14: 3 mL via INTRAVENOUS

## 2016-03-14 MED ORDER — MIDAZOLAM HCL 2 MG/2ML IJ SOLN
INTRAMUSCULAR | Status: DC | PRN
  Administered 2016-03-14: 2 mg via INTRAVENOUS

## 2016-03-14 SURGICAL SUPPLY — 20 items
BALLN EUPHORA RX 2.5X12 (BALLOONS) ×2
BALLN ~~LOC~~ EUPHORA RX 3.0X12 (BALLOONS) ×2
BALLOON EUPHORA RX 2.5X12 (BALLOONS) IMPLANT
BALLOON ~~LOC~~ EUPHORA RX 3.0X12 (BALLOONS) IMPLANT
CATH INFINITI 5 FR JL3.5 (CATHETERS) ×1 IMPLANT
CATH INFINITI 5FR ANG PIGTAIL (CATHETERS) ×1 IMPLANT
CATH INFINITI JR4 5F (CATHETERS) ×1 IMPLANT
CATH VISTA GUIDE 6FR XBLAD3.5 (CATHETERS) ×1 IMPLANT
DEVICE RAD COMP TR BAND LRG (VASCULAR PRODUCTS) ×1 IMPLANT
GLIDESHEATH SLEND SS 6F .021 (SHEATH) ×1 IMPLANT
KIT ENCORE 26 ADVANTAGE (KITS) ×1 IMPLANT
KIT HEART LEFT (KITS) ×2 IMPLANT
PACK CARDIAC CATHETERIZATION (CUSTOM PROCEDURE TRAY) ×2 IMPLANT
STENT SYNERGY DES 2.75X16 (Permanent Stent) ×1 IMPLANT
SYR MEDRAD MARK V 150ML (SYRINGE) ×2 IMPLANT
TRANSDUCER W/STOPCOCK (MISCELLANEOUS) ×2 IMPLANT
TUBING CIL FLEX 10 FLL-RA (TUBING) ×2 IMPLANT
WIRE COUGAR XT STRL 190CM (WIRE) ×1 IMPLANT
WIRE HI TORQ VERSACORE-J 145CM (WIRE) ×1 IMPLANT
WIRE SAFE-T 1.5MM-J .035X260CM (WIRE) ×1 IMPLANT

## 2016-03-14 NOTE — Progress Notes (Signed)
Echocardiogram 2D Echocardiogram has been performed.  Aggie Cosier 03/14/2016, 9:41 AM

## 2016-03-14 NOTE — H&P (View-Only) (Signed)
     SUBJECTIVE: No chest pain or dyspnea.   Tele: sinus  BP 124/72 mmHg  Pulse 59  Temp(Src) 98.5 F (36.9 C) (Oral)  Resp 19  Ht 5\' 5"  (1.651 m)  Wt 165 lb 9.6 oz (75.116 kg)  BMI 27.56 kg/m2  SpO2 98%  Intake/Output Summary (Last 24 hours) at 03/14/16 0848 Last data filed at 03/13/16 2300  Gross per 24 hour  Intake 434.87 ml  Output      0 ml  Net 434.87 ml    PHYSICAL EXAM General: Well developed, well nourished, in no acute distress. Alert and oriented x 3.  Psych:  Good affect, responds appropriately Neck: No JVD. No masses noted.  Lungs: Clear bilaterally with no wheezes or rhonci noted.  Heart: RRR with no murmurs noted. Abdomen: Bowel sounds are present. Soft, non-tender.  Extremities: No lower extremity edema.   LABS: Basic Metabolic Panel:  Recent Labs  03/12/16 0856 03/13/16 0039  NA 140 138  K 4.6 3.7  CL 106 107  CO2 24 26  GLUCOSE 99 102*  BUN 9 12  CREATININE 1.02 1.00  CALCIUM 9.1 8.3*   CBC:  Recent Labs  03/13/16 0039 03/14/16 0357  WBC 4.9 5.4  HGB 14.7 15.6  HCT 43.2 46.6  MCV 92.5 93.0  PLT 190 190   Cardiac Enzymes:  Recent Labs  03/12/16 1805 03/13/16 0032 03/13/16 0608  TROPONINI 0.77* 0.86* 0.43*   Fasting Lipid Panel:  Recent Labs  03/12/16 1312  CHOL 180  HDL 59  LDLCALC 113*  TRIG 38  CHOLHDL 3.1    Current Meds: . aspirin EC  81 mg Oral Daily  . atorvastatin  40 mg Oral QHS  . folic acid  1 mg Oral Daily  . multivitamin with minerals  1 tablet Oral Daily  . nicotine  14 mg Transdermal Daily  . sodium chloride flush  3 mL Intravenous Q12H  . sodium chloride flush  3 mL Intravenous Q12H  . thiamine  100 mg Oral Daily   Or  . thiamine  100 mg Intravenous Daily     ASSESSMENT AND PLAN:  1. NSTEMI: Pt admitted with chest pain in the setting of cocaine use. Troponin elevated. Plans for cardiac cath today to exclude obstructive CAD.   2. HTN: BP controlled.   Lauree Chandler  7/3/20178:48 AM

## 2016-03-14 NOTE — Progress Notes (Signed)
   Subjective: Pt.was lying in his bed comfortably. No chest pain or SOB. Wants to have his prostate check but no related complaints. No hesitancy, frequency or poor stream. No nocturia. Objective: Vital signs in last 24 hours: Filed Vitals:   03/13/16 2108 03/14/16 0015 03/14/16 0444 03/14/16 0825  BP: 128/80 126/78 130/70 124/72  Pulse:  53 55 59  Temp: 98.9 F (37.2 C) 98.4 F (36.9 C) 98.4 F (36.9 C) 98.5 F (36.9 C)  TempSrc:  Oral Oral Oral  Resp: 20 22 20 19   Height:      Weight:   165 lb 9.6 oz (75.116 kg)   SpO2: 95% 95% 97% 98%   Physical Exam  Well built male, lying comfortably in bed. EYES:PERRL EENT:Normal NECK:Supple, no thyromegaly CHEST: Non tender, Breath sounds normal , no rales or wheezing CVS: S1/S2, no murmer, rubs or gallops. ABD: Soft, non tender,not distended. No organomegaly. BS +ve NEURO: Alert, oriented and neuro grossly intact. EXTREMITIES: warm, moist,no oedema, Periferal pulses symmetrical bilaterally.  LABS: Only positive for mildly increased heparin levels at 0.72(0.3-0.7)            Pharmacy decreased the dose to 1350 units/hr.  MEDS: Reviewed  Assessment/Plan:  :NSTEMI:60 y.o. man with H/O uncontrolled hypertension, tobacco, alcohol and cocaine abuse came with c/o chest pain. Although his EKG doesn't show any ST elevation, his first set of Troponin is mildly elevated at 0.14 and with up ward trend later on and concerning symptoms and H/O cocaine taken a night before cardiology was consulted. Pt. Is scheduled for cardiac catheterization today at noon.Marland Kitchen Avoid Beta blocker at this time due to his H/O Cocaine abuse. Heparin bolus 4000 units, followed by Heparin infusion 100 units/ml/hr Now at 1350 units/hr Nitroglycerine 50 mg 5% dextrose 250 ml Telemonitor  Pulse oximetry  HYPERTENSION: Hold his home diuretic due to nonspecific T wave changes and right axis deviation on his EKG.and monitor his BP. ACEI or beta blocker can started if he  had hypertension.  COCAIN ABUSE: Counsel as that can be a contributory factor for his current condition.  ALCOHOL ABUSE; CIWA protocol and counsel. He is willing to stop his heavy alcohol intake and illicit drug abuse.  TOBACCO ABUSE: He is willing to stop and requested a nicotine patch, which was ordered.  DIET: Cardiac diet    Dispo: Anticipated discharge in approximately 1-2 day(s). Pending on his cardiac cath. Results.  LOS: 2 days   Lorella Nimrod, MD 03/14/2016, 8:41 AM Pager: [phone]

## 2016-03-14 NOTE — Interval H&P Note (Signed)
History and Physical Interval Note:  03/14/2016 12:28 PM  Lucas Stark  has presented today for cardiac cath with the diagnosis of NSTEMI  The various methods of treatment have been discussed with the patient and family. After consideration of risks, benefits and other options for treatment, the patient has consented to  Procedure(s): Left Heart Cath and Coronary Angiography (N/A) as a surgical intervention .  The patient's history has been reviewed, patient examined, no change in status, stable for surgery.  I have reviewed the patient's chart and labs.  Questions were answered to the patient's satisfaction.    Cath Lab Visit (complete for each Cath Lab visit)  Clinical Evaluation Leading to the Procedure:   ACS: Yes.    Non-ACS:    Anginal Classification: CCS III  Anti-ischemic medical therapy: No Therapy  Non-Invasive Test Results: No non-invasive testing performed  Prior CABG: No previous CABG         Lauree Chandler

## 2016-03-14 NOTE — Progress Notes (Signed)
ANTICOAGULATION CONSULT NOTE  Pharmacy Consult:  Heparin Indication: chest pain/ACS/NSTEMI  Allergies  Allergen Reactions  . Other Itching    Sleeping pills    Patient Measurements: Height: 5\' 5"  (165.1 cm) Weight: 165 lb 9.6 oz (75.116 kg) IBW/kg (Calculated) : 61.5 Heparin Dosing Weight: 77 kg  Vital Signs: Temp: 98.5 F (36.9 C) (07/03 0825) Temp Source: Oral (07/03 0825) BP: 124/72 mmHg (07/03 0825) Pulse Rate: 59 (07/03 0825)  Labs:  Recent Labs  03/12/16 0856  03/12/16 1805 03/13/16 0032 03/13/16 0039 03/13/16 0608 03/13/16 1309 03/13/16 2112 03/14/16 0357  HGB 15.9  --   --   --  14.7  --   --   --  15.6  HCT 46.9  --   --   --  43.2  --   --   --  46.6  PLT 210  --   --   --  190  --   --   --  190  LABPROT  --   --   --   --   --   --   --   --  14.1  INR  --   --   --   --   --   --   --   --  1.07  HEPARINUNFRC  --   < > 0.26*  --  0.23*  --  0.19* 0.49 0.72*  CREATININE 1.02  --   --   --  1.00  --   --   --   --   TROPONINI 0.14*  < > 0.77* 0.86*  --  0.43*  --   --   --   < > = values in this interval not displayed.  Estimated Creatinine Clearance: 76.2 mL/min (by C-G formula based on Cr of 1).    Assessment: 5 YOM who presented to the [facility] on 03/12/16 with CP and elevated troponins.  Pharmacy consulted to manage heparin for NSTEMI.  Heparin level therapeutic post rate decrease.  No bleeding reported.  Goal of Therapy:  Heparin level 0.3-0.7 units/ml Monitor platelets by anticoagulation protocol: Yes    Plan:  - Continue heparin drip at 1350 units/hr  - Daily HL and CBC - Will continue to monitor for any signs/symptoms of bleeding  - F/U with starting an ACEi/ARB for EF 35-40% - F/U post cath   Zackry Deines D. Mina Marble, PharmD, BCPS Pager:  [phone] 03/14/2016, 12:07 PM

## 2016-03-14 NOTE — Progress Notes (Signed)
CARDIAC REHAB PHASE I   Pt in bed, recently returned from cath lab, declines ambulation at this time. Encouraged pt to ambulate with staff prior to discharge. Completed MI/stent education.  Reviewed risk factors, tobacco/etoh/cocaine cessation (gave pt fake cigarette), MI book, anti-platelet therapy, stent card, activity restrictions, ntg, exercise, heart healthy diet, portion control, sodium restrictions and phase 2 cardiac rehab. Pt verbalized understanding. Pt agrees to phase 2 cardiac rehab referral, will send to [facility] per pt request. Pt does not have insurance, pt to see case manager prior to discharge, RN aware. Pt in bed call bell within reach.   [id number] Lenna Sciara, RN, BSN 03/14/2016 3:39 PM

## 2016-03-14 NOTE — Progress Notes (Signed)
ANTICOAGULATION CONSULT NOTE - Follow Up Consult  Pharmacy Consult for heparin Indication: NSTEMI  Labs:  Recent Labs  03/12/16 0856  03/12/16 1805 03/13/16 0032 03/13/16 0039 03/13/16 0608 03/13/16 1309 03/13/16 2112 03/14/16 0357  HGB 15.9  --   --   --  14.7  --   --   --  15.6  HCT 46.9  --   --   --  43.2  --   --   --  46.6  PLT 210  --   --   --  190  --   --   --  190  HEPARINUNFRC  --   < > 0.26*  --  0.23*  --  0.19* 0.49 0.72*  CREATININE 1.02  --   --   --  1.00  --   --   --   --   TROPONINI 0.14*  < > 0.77* 0.86*  --  0.43*  --   --   --   < > = values in this interval not displayed.    Assessment: 60yo male now slightly subtherapeutic on heparin after one level at goal.  Goal of Therapy:  Heparin level 0.3-0.7 units/ml   Plan:  Will decrease heparin gtt slightly to 1350 units/hr and check level in 6hr.  Wynona Neat, PharmD, BCPS  03/14/2016,4:32 AM

## 2016-03-14 NOTE — Progress Notes (Signed)
TR BAND REMOVAL  LOCATION:    right radial  DEFLATED PER PROTOCOL:    Yes.    TIME BAND OFF / DRESSING APPLIED:    1800   SITE UPON ARRIVAL:    Level 0  SITE AFTER BAND REMOVAL:    Level 0  CIRCULATION SENSATION AND MOVEMENT:    Within Normal Limits   Yes.    COMMENTS:   Rechecked at 1830 and 1900 with no change in assessment noted

## 2016-03-14 NOTE — Progress Notes (Signed)
     SUBJECTIVE: No chest pain or dyspnea.   Tele: sinus  BP 124/72 mmHg  Pulse 59  Temp(Src) 98.5 F (36.9 C) (Oral)  Resp 19  Ht 5\' 5"  (1.651 m)  Wt 165 lb 9.6 oz (75.116 kg)  BMI 27.56 kg/m2  SpO2 98%  Intake/Output Summary (Last 24 hours) at 03/14/16 0848 Last data filed at 03/13/16 2300  Gross per 24 hour  Intake 434.87 ml  Output      0 ml  Net 434.87 ml    PHYSICAL EXAM General: Well developed, well nourished, in no acute distress. Alert and oriented x 3.  Psych:  Good affect, responds appropriately Neck: No JVD. No masses noted.  Lungs: Clear bilaterally with no wheezes or rhonci noted.  Heart: RRR with no murmurs noted. Abdomen: Bowel sounds are present. Soft, non-tender.  Extremities: No lower extremity edema.   LABS: Basic Metabolic Panel:  Recent Labs  03/12/16 0856 03/13/16 0039  NA 140 138  K 4.6 3.7  CL 106 107  CO2 24 26  GLUCOSE 99 102*  BUN 9 12  CREATININE 1.02 1.00  CALCIUM 9.1 8.3*   CBC:  Recent Labs  03/13/16 0039 03/14/16 0357  WBC 4.9 5.4  HGB 14.7 15.6  HCT 43.2 46.6  MCV 92.5 93.0  PLT 190 190   Cardiac Enzymes:  Recent Labs  03/12/16 1805 03/13/16 0032 03/13/16 0608  TROPONINI 0.77* 0.86* 0.43*   Fasting Lipid Panel:  Recent Labs  03/12/16 1312  CHOL 180  HDL 59  LDLCALC 113*  TRIG 38  CHOLHDL 3.1    Current Meds: . aspirin EC  81 mg Oral Daily  . atorvastatin  40 mg Oral QHS  . folic acid  1 mg Oral Daily  . multivitamin with minerals  1 tablet Oral Daily  . nicotine  14 mg Transdermal Daily  . sodium chloride flush  3 mL Intravenous Q12H  . sodium chloride flush  3 mL Intravenous Q12H  . thiamine  100 mg Oral Daily   Or  . thiamine  100 mg Intravenous Daily     ASSESSMENT AND PLAN:  1. NSTEMI: Pt admitted with chest pain in the setting of cocaine use. Troponin elevated. Plans for cardiac cath today to exclude obstructive CAD.   2. HTN: BP controlled.   Lauree Chandler  7/3/20178:48 AM

## 2016-03-15 ENCOUNTER — Encounter (HOSPITAL_COMMUNITY): Payer: Self-pay | Admitting: Nurse Practitioner

## 2016-03-15 DIAGNOSIS — E785 Hyperlipidemia, unspecified: Secondary | ICD-10-CM

## 2016-03-15 DIAGNOSIS — Z720 Tobacco use: Secondary | ICD-10-CM

## 2016-03-15 DIAGNOSIS — I255 Ischemic cardiomyopathy: Secondary | ICD-10-CM

## 2016-03-15 DIAGNOSIS — I5022 Chronic systolic (congestive) heart failure: Secondary | ICD-10-CM

## 2016-03-15 DIAGNOSIS — Z955 Presence of coronary angioplasty implant and graft: Secondary | ICD-10-CM | POA: Insufficient documentation

## 2016-03-15 DIAGNOSIS — I25119 Atherosclerotic heart disease of native coronary artery with unspecified angina pectoris: Secondary | ICD-10-CM

## 2016-03-15 DIAGNOSIS — I2510 Atherosclerotic heart disease of native coronary artery without angina pectoris: Secondary | ICD-10-CM

## 2016-03-15 DIAGNOSIS — I501 Left ventricular failure: Secondary | ICD-10-CM

## 2016-03-15 DIAGNOSIS — Z9861 Coronary angioplasty status: Secondary | ICD-10-CM

## 2016-03-15 DIAGNOSIS — I119 Hypertensive heart disease without heart failure: Secondary | ICD-10-CM

## 2016-03-15 LAB — CBC
HCT: 45.2 % (ref 39.0–52.0)
Hemoglobin: 15.3 g/dL (ref 13.0–17.0)
MCH: 31.2 pg (ref 26.0–34.0)
MCHC: 33.8 g/dL (ref 30.0–36.0)
MCV: 92.1 fL (ref 78.0–100.0)
PLATELETS: 204 10*3/uL (ref 150–400)
RBC: 4.91 MIL/uL (ref 4.22–5.81)
RDW: 13.7 % (ref 11.5–15.5)
WBC: 5.1 10*3/uL (ref 4.0–10.5)

## 2016-03-15 LAB — BASIC METABOLIC PANEL
Anion gap: 7 (ref 5–15)
BUN: 16 mg/dL (ref 6–20)
CALCIUM: 9 mg/dL (ref 8.9–10.3)
CO2: 23 mmol/L (ref 22–32)
CREATININE: 1.05 mg/dL (ref 0.61–1.24)
Chloride: 107 mmol/L (ref 101–111)
Glucose, Bld: 94 mg/dL (ref 65–99)
Potassium: 3.9 mmol/L (ref 3.5–5.1)
SODIUM: 137 mmol/L (ref 135–145)

## 2016-03-15 MED ORDER — ATORVASTATIN CALCIUM 80 MG PO TABS: 80.0000 mg | ORAL_TABLET | Freq: Every day | ORAL | Status: DC

## 2016-03-15 MED ORDER — LISINOPRIL 5 MG PO TABS
5.0000 mg | ORAL_TABLET | Freq: Every day | ORAL | Status: DC
  Administered 2016-03-15: 5 mg via ORAL
  Filled 2016-03-15: qty 1

## 2016-03-15 MED ORDER — LISINOPRIL 5 MG PO TABS: 5.0000 mg | ORAL_TABLET | Freq: Every day | ORAL | Status: DC

## 2016-03-15 MED ORDER — ANGIOPLASTY BOOK
Freq: Once | Status: AC
  Administered 2016-03-15: 07:00:00
  Filled 2016-03-15: qty 1

## 2016-03-15 MED ORDER — NICOTINE 14 MG/24HR TD PT24: 14.0000 mg | MEDICATED_PATCH | Freq: Every day | TRANSDERMAL | Status: DC

## 2016-03-15 MED ORDER — HEART ATTACK BOUNCING BOOK
Freq: Once | Status: AC
  Administered 2016-03-15: 07:00:00
  Filled 2016-03-15: qty 1

## 2016-03-15 MED ORDER — TICAGRELOR 90 MG PO TABS: 90.0000 mg | ORAL_TABLET | Freq: Two times a day (BID) | ORAL | Status: DC

## 2016-03-15 MED ORDER — ASPIRIN 81 MG PO TBEC: 81.0000 mg | DELAYED_RELEASE_TABLET | Freq: Every day | ORAL | Status: DC

## 2016-03-15 MED ORDER — NITROGLYCERIN 0.4 MG SL SUBL: 0.4000 mg | SUBLINGUAL_TABLET | SUBLINGUAL | Status: DC | PRN

## 2016-03-15 NOTE — Progress Notes (Signed)
   Subjective: Pt. Feeling better. No complaints. Had his Cath. And Echo yesterday.  Objective: Vital signs in last 24 hours: Filed Vitals:   03/15/16 0140 03/15/16 0415 03/15/16 0640 03/15/16 0750  BP: 148/74 132/74 128/78 151/78  Pulse: 54 52 60 47  Temp: 98.1 F (36.7 C) 98.1 F (36.7 C)  98 F (36.7 C)  TempSrc: Oral Oral  Oral  Resp: 17 18 13 14   Height:      Weight:      SpO2: 95% 96% 95% 95%   Physical Exam Well built male, lying comfortably in bed. CHEST: Non tender, Breath sounds normal , no rales or wheezing CVS: S1/S2, no murmer, rubs or gallops. ABD: Soft, non tender,not distended. No organomegaly. BS +ve NEURO: Alert, oriented and neuro grossly intact. EXTREMITIES: warm, moist,no oedema, Periferal pulses symmetrical bilaterally.  LABS: Reviewed  MEDS:Reviewed   CATH:  2nd Mrg lesion, 99% stenosed. Post intervention, there is a 0% residual stenosis.  There is mild left ventricular systolic dysfunction.  1. Single vessel CAD with severe stenosis first obtuse marginal branch 2. Successful PTCA/DES x 1 first obtuse marginal branch 3. No obstructive disease in the RCA or LAD 4. Mild segmental LV systolic dysfunction  ECHO:  Study Conclusions  - Left ventricle: The cavity size was mildly dilated. Wall  thickness was normal. Systolic function was moderately reduced.  The estimated ejection fraction was in the range of 35% to 40%.  Diffuse hypokinesis. There is severe hypokinesis of the  inferolateral myocardium. Features are consistent with a  pseudonormal left ventricular filling pattern, with concomitant  abnormal relaxation and increased filling pressure (grade 2  diastolic dysfunction).  Impressions:  - Global hypokinesis (worse in the inferior lateral wall) with  overall moderately reduced LV function; grade 2 diastolic  dysfunction; mild LVE; trace MR.   Assessment/Plan:  1 :NSTEMI:60 y.o. man with H/O uncontrolled hypertension,  tobacco, alcohol and cocaine abuse came with c/o chest pain. Had cardiac craterization and Echo yesterday. ECHO shows decrease in EF to 35-40% and left ventricular hypokinesia which is consist ant with inferolateral infarct. On Cath. He had one small vessel occlusion which was restored with stent placement. His occlusion doesn't explain all his condition and we think his cocaine use played an important role. We will discharge him today to follow up with his PCP and Cardiologist in 1 week. Discharge him on Aspirin 81 mg QID Brilinta  90 mg QID Lipitor 40mg  QID Lisinopril 5mg  QID Heart healthy diet  2:Hypertyension: He was started on lisinopril. Should avoid beta blocker due to his H/O cocaine abuse.  3:Cocaine, Alcohol, and Tobacco Abuse: We council him again on the important of quitting and their relationship to his current condition. We provide him with Nicotine patch at his request.    Dispo: Pt. is being discharged today. LOS: 3 days     Lorella Nimrod, MD 03/15/2016, 10:17 AM Pager: [phone]

## 2016-03-15 NOTE — Discharge Summary (Signed)
Name: Lucas Stark MRN: [id number] DOB: 1955-11-11 60 y.o. PCP: Provider Default, MD  Date of Admission: 03/12/2016  8:52 AM Date of Discharge: 03/15/2016 Attending Physician: Oval Linsey, MD  Discharge Diagnosis: 1;CAD(NSTEMI) 2; Hypertension 3: Diastolic dysfunction 4: Tobacco abuse 4; Cocaine abuse 5: Alcohol abuse   Discharge Medications:   Medication List    STOP taking these medications        meloxicam 7.5 MG tablet  Commonly known as:  MOBIC     triamterene-hydrochlorothiazide 37.5-25 MG capsule  Commonly known as:  DYAZIDE      TAKE these medications        aspirin 81 MG EC tablet  Take 1 tablet (81 mg total) by mouth daily.     atorvastatin 80 MG tablet  Commonly known as:  LIPITOR  Take 1 tablet (80 mg total) by mouth at bedtime.     lisinopril 5 MG tablet  Commonly known as:  PRINIVIL,ZESTRIL  Take 1 tablet (5 mg total) by mouth daily.     nitroGLYCERIN 0.4 MG SL tablet  Commonly known as:  NITROSTAT  Place 1 tablet (0.4 mg total) under the tongue every 5 (five) minutes as needed for chest pain.     ticagrelor 90 MG Tabs tablet  Commonly known as:  BRILINTA  Take 1 tablet (90 mg total) by mouth 2 (two) times daily.      ASK your doctor about these medications        ibuprofen 200 MG tablet  Commonly known as:  ADVIL,MOTRIN  Take 400 mg by mouth every 6 (six) hours as needed for moderate pain.     methocarbamol 500 MG tablet  Commonly known as:  ROBAXIN  Take 1 tablet (500 mg total) by mouth 2 (two) times daily as needed for muscle spasms.     traMADol 50 MG tablet  Commonly known as:  ULTRAM  Take 1 tablet (50 mg total) by mouth every 6 (six) hours as needed.        Disposition and follow-up:   Lucas Stark was discharged from [facility] in Good condition.  At the hospital follow up visit please address:  1.  His BP and his compliance with medicine.Encourage him to stay away from his Alcohol, tobacco and  illicit drugs.  2.  Labs / imaging needed at time of follow-up: None  3.  Pending labs/ test needing follow-up: None  Follow-up Appointments:     Follow-up Information    Follow up with Lucas Ruths, MD In 1 week.   Specialty:  Cardiology   Why:  we will arrange for follow-up and contact you.   Contact information:   [address] [place] [address] [phone]       Hospital Course by problem list: 1. CAD (NSTEMI) 60 y.o. man with H/O uncontrolled hypertension, tobacco, alcohol and cocaine abuse came with c/o chest pain. Although his EKG doesn't show any ST elevation,but with his upwards trending troponin and concerning symptoms with H/O cocaine taken a night before cardiology was consulted.Had cardiac catherization and Echo yesterday. ECHO shows decrease in EF to 35-40% and left ventricular hypokinesia which is consist ant with inferolateral infarct. On Cath. He had one small vessel occlusion which was restored with stent placement. His occlusion doesn't explain all his condition and we think his cocaine use played an important role. He was discharged on Aspirin 81 mg QID Brilinta 90 mg QID Lipitor 40mg  QID Lisinopril 5mg  QID  Heart healthy diet  2::Hypertyension: He was started on lisinopril. Should avoid beta blocker due to his H/O cocaine abuse.  3:Cocaine, Alcohol, and Tobacco Abuse: We council him again on the important of quitting and their relationship to his current condition. We provide him with Nicotine patch at his request.   Discharge Vitals:   BP 151/78 mmHg  Pulse 47  Temp(Src) 98 F (36.7 C) (Oral)  Resp 14  Ht 5\' 5"  (1.651 m)  Wt 165 lb 9.6 oz (75.116 kg)  BMI 27.56 kg/m2  SpO2 95%   Physical Exam Well built male, lying comfortably in bed. CHEST: Non tender, Breath sounds normal , no rales or wheezing CVS: S1/S2, no murmer, rubs or gallops. ABD: Soft, non tender,not distended. No organomegaly. BS +ve NEURO: Alert, oriented and neuro  grossly intact. EXTREMITIES: warm, moist,no oedema, Periferal pulses symmetrical bilaterally.    Pertinent Labs, Studies, and Procedures:  Troponin  0.14   0.42   0.77  0.86  CATH: 2nd Mrg lesion, 99% stenosed. Post intervention, there is a 0% residual stenosis.  There is mild left ventricular systolic dysfunction.  1. Single vessel CAD with severe stenosis first obtuse marginal branch 2. Successful PTCA/DES x 1 first obtuse marginal branch 3. No obstructive disease in the RCA or LAD 4. Mild segmental LV systolic dysfunction  ECHO:  Study Conclusions  - Left ventricle: The cavity size was mildly dilated. Wall  thickness was normal. Systolic function was moderately reduced.  The estimated ejection fraction was in the range of 35% to 40%.  Diffuse hypokinesis. There is severe hypokinesis of the  inferolateral myocardium. Features are consistent with a  pseudonormal left ventricular filling pattern, with concomitant  abnormal relaxation and increased filling pressure (grade 2  diastolic dysfunction).  Impressions:  - Global hypokinesis (worse in the inferior lateral wall) with  overall moderately reduced LV function; grade 2 diastolic  dysfunction; mild LVE; trace MR.   Discharge Instructions: Discharge Instructions    Amb Referral to Cardiac Rehabilitation    Complete by:  As directed   Diagnosis:   NSTEMI Coronary Stents             Signed: Lorella Nimrod, MD 03/15/2016, 10:03 AM   Pager: [phone]

## 2016-03-15 NOTE — Progress Notes (Signed)
Patient Name: Lucas Stark Date of Encounter: 03/15/2016  Hospital Problem List     Principal Problem:   NSTEMI (non-ST elevated myocardial infarction) [facility]) Active Problems:   Cocaine use   Coronary artery disease with angina pectoris ([place])   Cardiomyopathy, ischemic   LVF (left ventricular failure) ([place])   Tobacco abuse   Alcohol abuse   Hypertensive heart disease without CHF   Hyperlipidemia    Subjective   No chest pain or sob.  R wrist feels good.  Eager to go home.  Inpatient Medications    . aspirin EC  81 mg Oral Daily  . atorvastatin  40 mg Oral QHS  . folic acid  1 mg Oral Daily  . heparin subcutaneous  5,000 Units Subcutaneous Q8H  . lisinopril  5 mg Oral Daily  . multivitamin with minerals  1 tablet Oral Daily  . nicotine  14 mg Transdermal Daily  . sodium chloride flush  3 mL Intravenous Q12H  . sodium chloride flush  3 mL Intravenous Q12H  . thiamine  100 mg Oral Daily  . ticagrelor  90 mg Oral BID    Vital Signs    Filed Vitals:   03/15/16 0140 03/15/16 0415 03/15/16 0640 03/15/16 0750  BP: 148/74 132/74 128/78 151/78  Pulse: 54 52 60 47  Temp: 98.1 F (36.7 C) 98.1 F (36.7 C)  98 F (36.7 C)  TempSrc: Oral Oral  Oral  Resp: 17 18 13 14   Height:      Weight:      SpO2: 95% 96% 95% 95%    Intake/Output Summary (Last 24 hours) at 03/15/16 0913 Last data filed at 03/15/16 0700  Gross per 24 hour  Intake 1294.83 ml  Output    800 ml  Net 494.83 ml   Filed Weights   03/13/16 0045 03/13/16 0500 03/14/16 0444  Weight: 165 lb 3.2 oz (74.934 kg) 165 lb 3.2 oz (74.934 kg) 165 lb 9.6 oz (75.116 kg)    Physical Exam    General: Pleasant, NAD. Neuro: Alert and oriented X 3. Moves all extremities spontaneously. Psych: Flat affect. HEENT:  Normal  Neck: Supple without bruits or JVD. Lungs:  Resp regular and unlabored, CTA. Heart: RRR no s3, s4, or murmurs. Abdomen: Soft, non-tender, non-distended, BS + x 4.  Extremities: No clubbing,  cyanosis or edema. DP/PT/Radials 2+ and equal bilaterally.  R wrist w/o bleeding/bruit/hematoma.  Labs    CBC  Recent Labs  03/14/16 0357 03/15/16 0431  WBC 5.4 5.1  HGB 15.6 15.3  HCT 46.6 45.2  MCV 93.0 92.1  PLT 190 0000000   Basic Metabolic Panel  Recent Labs  03/13/16 0039 03/15/16 0431  NA 138 137  K 3.7 3.9  CL 107 107  CO2 26 23  GLUCOSE 102* 94  BUN 12 16  CREATININE 1.00 1.05  CALCIUM 8.3* 9.0   Cardiac Enzymes  Recent Labs  03/12/16 1805 03/13/16 0032 03/13/16 0608  TROPONINI 0.77* 0.86* 0.43*   Hemoglobin A1C  Recent Labs  03/12/16 1312  HGBA1C 5.7*   Fasting Lipid Panel  Recent Labs  03/12/16 1312  CHOL 180  HDL 59  LDLCALC 113*  TRIG 38  CHOLHDL 3.1    Telemetry    Sinus brady/sinus rhythm.  ECG    Sinus brady, 56, no acute st/t changes.  Radiology    Dg Chest 2 View  03/12/2016  CLINICAL DATA:  Central burning sensation EXAM: CHEST  2 VIEW COMPARISON:  07/08/2015  FINDINGS: The heart size and mediastinal contours are within normal limits. Both lungs are clear. The visualized skeletal structures are unremarkable. Nipple shadows are noted bilaterally. IMPRESSION: No active cardiopulmonary disease. Electronically Signed   By: Inez Catalina M.D.   On: 03/12/2016 09:55    Assessment & Plan    1. NSTEMI/CAD w/ angina:  S/p cath/PCI of the OM.  No recurrent c/p.  Plan d/c today on asa, brilinta, high potency statin.  No  blocker in setting of recent cocaine use and baseline bradycardia.  Add acei in setting of ACS/ICM/HTN.  I will arrange to have office call him for f/u within 7 days.  I stressed the importance of compliance with medical therapy.  2.  ICM/LV Failure:  Euvolemic on exam.  EF 35-40% by echo, 45-50% by LV gram.  No  blocker in setting of recent cocaine.  Adding acei.  3. Cocaine/Tobacco/Alcohol Abuse:  Complete cessation advised.  He says that he is done with "all of that."  4.  Hypertensive Heart Disease w/o CHF:  BP  variable.  Mostly 140's to 150's overnight. Add low dose ACEI.  F/u BMET @ return office visit in a week.  5.  HL:  LDL 113.  Cont high potency statin.  F/u Lipids/lft's in 6 wks.  Signed, Murray Hodgkins NP  The patient was seen and examined, and I agree with the physical exam, assessment and plan as documented above by Angelica Ran NP. Patient doing well. Denies CP/SOB. Eager to go home. Will be discharged today on aforementioned medical therapy.  Kate Sable, MD, [facility]  03/15/2016 10:23 AM

## 2016-03-15 NOTE — Care Management Note (Signed)
Case Management Note  Patient Details  Name: Lucas Stark MRN: [id number] Date of Birth: Jul 02, 1956  Subjective/Objective:                    Action/Plan: Pt with new prescription for Brilinta. Pt given 30 day free card and information to call for assistance after the first 30 days. Pt will follow up with cardiology after discharge. Pt is already active with [facility] and uses a pharmacy at the [facility] for his medications. Pt states most of his current meds are $2-3 a piece. Bedside RN updated.   Expected Discharge Date:                  Expected Discharge Plan:  Home/Self Care  In-House Referral:     Discharge planning Services  CM Consult  Post Acute Care Choice:    Choice offered to:     DME Arranged:    DME Agency:     HH Arranged:    HH Agency:     Status of Service:  In process, will continue to follow  If discussed at Long Length of Stay Meetings, dates discussed:    Additional Comments:  Pollie Friar, RN 03/15/2016, 10:16 AM

## 2016-03-16 ENCOUNTER — Telehealth: Payer: Self-pay | Admitting: Cardiology

## 2016-03-16 NOTE — Telephone Encounter (Signed)
New message    TOC appt on  7.12.2017 with Truitt Merle .

## 2016-03-16 NOTE — Telephone Encounter (Signed)
Patient contacted regarding discharge from [facility] 03/15/16.  Patient understands to follow up with Truitt Merle NP 03/23/16 at 2:00 pm at [facility] office. Patient understands discharge instructions. Patient understands medications and regiment. Patient understands to bring all medications to this visit.  Patient stated he is feeling well today no complaints.

## 2016-03-21 ENCOUNTER — Telehealth: Payer: Self-pay | Admitting: Cardiology

## 2016-03-21 NOTE — Telephone Encounter (Signed)
Returned call to patient. He noticed a soreness under his left arm on 7/4 in the evening He has never had a rash under his arm He is not sure if it was the hospital deodorant Spoke with clinical pharmacy staff and was informed lipitor is unlikely to be the cause of the rash  Patient advised on address of appt 7/12 w/Lori Gerhardt. Advised he have her examine this area and bring his medications w/him

## 2016-03-21 NOTE — Telephone Encounter (Signed)
New Message     Pt c/o medication issue:  1. Name of Medication: Atorvastatin  2. How are you currently taking this medication (dosage and times per day)? 80 mg daily  3. Are you having a reaction (difficulty breathing--STAT)  no  4. What is your medication issue? Pt state he has a rash under his left arm. Could this be from his medicine?

## 2016-03-23 ENCOUNTER — Encounter: Payer: Self-pay | Admitting: Nurse Practitioner

## 2016-03-23 ENCOUNTER — Ambulatory Visit (INDEPENDENT_AMBULATORY_CARE_PROVIDER_SITE_OTHER): Payer: Self-pay | Admitting: Nurse Practitioner

## 2016-03-23 VITALS — BP 108/64 | HR 64 | Ht 65.0 in | Wt 164.8 lb

## 2016-03-23 DIAGNOSIS — F1910 Other psychoactive substance abuse, uncomplicated: Secondary | ICD-10-CM

## 2016-03-23 DIAGNOSIS — I214 Non-ST elevation (NSTEMI) myocardial infarction: Secondary | ICD-10-CM

## 2016-03-23 DIAGNOSIS — I10 Essential (primary) hypertension: Secondary | ICD-10-CM

## 2016-03-23 LAB — CBC
HCT: 45.2 % (ref 38.5–50.0)
Hemoglobin: 15.9 g/dL (ref 13.2–17.1)
MCH: 31.6 pg (ref 27.0–33.0)
MCHC: 35.2 g/dL (ref 32.0–36.0)
MCV: 89.9 fL (ref 80.0–100.0)
MPV: 9.2 fL (ref 7.5–12.5)
Platelets: 239 10*3/uL (ref 140–400)
RBC: 5.03 MIL/uL (ref 4.20–5.80)
RDW: 14.1 % (ref 11.0–15.0)
WBC: 6.1 10*3/uL (ref 3.8–10.8)

## 2016-03-23 LAB — BASIC METABOLIC PANEL
BUN: 13 mg/dL (ref 7–25)
CO2: 24 mmol/L (ref 20–31)
Calcium: 9.1 mg/dL (ref 8.6–10.3)
Chloride: 108 mmol/L (ref 98–110)
Creat: 1.03 mg/dL (ref 0.70–1.33)
Glucose, Bld: 99 mg/dL (ref 65–99)
Potassium: 4.2 mmol/L (ref 3.5–5.3)
Sodium: 140 mmol/L (ref 135–146)

## 2016-03-23 MED ORDER — NITROGLYCERIN 0.4 MG SL SUBL: 0.4000 mg | SUBLINGUAL_TABLET | SUBLINGUAL | Status: DC | PRN

## 2016-03-23 NOTE — Progress Notes (Signed)
CARDIOLOGY OFFICE NOTE  Date:  03/23/2016    [name] Date of Birth: 1956/01/22 Medical Record [id number]  PCP:  Default, Provider, MD  Cardiologist:    Stanford Breed  Chief Complaint  Patient presents with  . Coronary Artery Disease    Post hospital visit - seen for Dr. Stanford Breed    History of Present Illness: Lucas Stark is a 60 y.o. male who presents today for a post hospital visit/TOC visit. Seen for Dr. Stanford Breed. He has uncontrolled HTN, multi drug abuse, and no prior CAD.  Presented earlier this month with chest pain - had used cocaine the night prior - Echo with EF of 35 to 40% and he was cathed and had PCI. Discharged on [place].   Comes back today. Here alone. Has had some rash under his arm. Noted this after his discharge. Not really itchy but sensitive. He feels like it is drying up. It is only under the left arm - nothing under the right. No chest pain. Some shortness of breath but stable. No cigarettes today but smoking some since discharge. Says no alcohol and cocaine since discharge and he does not plan on restarting. He says he is able to afford his medicines.   Past Medical History  Diagnosis Date  . CAD (coronary artery disease)     a. 03/2016 NSTEMI/PCI: LM nl, LAD nl, RI nl, LCX nl, OM2 99 ( 2.75 x 16 Synergy DES), RCA nl, EF 45-50%.  . Hypertensive heart disease   . Hyperlipidemia   . Tobacco abuse   . Cocaine abuse   . ETOH abuse   . Ischemic cardiomyopathy     a. 03/2016 Echo: EF 35-40%, diff H, sev inflat HK, Gr2 DD;  b. 03/2016 EF 45-50% by LV gram.  . LVF (left ventricular failure) [facility])     Past Surgical History  Procedure Laterality Date  . No prior surgery    . Cardiac catheterization N/A 03/14/2016    Procedure: Left Heart Cath and Coronary Angiography;  Surgeon: Burnell Blanks, MD;  Location: [facility];  Service: Cardiovascular;  Laterality: N/A;     Medications: Current Outpatient Prescriptions  Medication Sig  Dispense Refill  . aspirin EC 81 MG EC tablet Take 1 tablet (81 mg total) by mouth daily.    Marland Kitchen atorvastatin (LIPITOR) 80 MG tablet Take 1 tablet (80 mg total) by mouth at bedtime. 30 tablet 6  . ibuprofen (ADVIL,MOTRIN) 200 MG tablet Take 400 mg by mouth every 6 (six) hours as needed for moderate pain.    Marland Kitchen lisinopril (PRINIVIL,ZESTRIL) 5 MG tablet Take 1 tablet (5 mg total) by mouth daily. 30 tablet 6  . ticagrelor (BRILINTA) 90 MG TABS tablet Take 1 tablet (90 mg total) by mouth 2 (two) times daily. 60 tablet 6  . nitroGLYCERIN (NITROSTAT) 0.4 MG SL tablet Place 1 tablet (0.4 mg total) under the tongue every 5 (five) minutes as needed for chest pain. 25 tablet 3   No current facility-administered medications for this visit.    Allergies: Allergies  Allergen Reactions  . Other Itching    Sleeping pills    Social History: The patient  reports that he has been smoking Cigarettes.  He has a 35 pack-year smoking history. He does not have any smokeless tobacco history on file. He reports that he drinks alcohol. He reports that he uses illicit drugs.   Family History: The patient's family history includes Heart attack in his father and  sister.   Review of Systems: Please see the history of present illness.   Otherwise, the review of systems is positive for none.   All other systems are reviewed and negative.   Physical Exam: VS:  BP 108/64 mmHg  Pulse 64  Ht 5\' 5"  (1.651 m)  Wt 164 lb 12.8 oz (74.753 kg)  BMI 27.42 kg/m2  SpO2 97% .  BMI Body mass index is 27.42 kg/(m^2).  Wt Readings from Last 3 Encounters:  03/23/16 164 lb 12.8 oz (74.753 kg)  03/14/16 165 lb 9.6 oz (75.116 kg)  07/11/14 175 lb (79.379 kg)    General: Alert. He is in no acute distress.  HEENT: Normal. Neck: Supple, no JVD, carotid bruits, or masses noted.  Cardiac: Regular rate and rhythm. No murmurs, rubs, or gallops. No edema.  Respiratory:  Lungs are clear to auscultation bilaterally with normal work of  breathing.  GI: Soft and nontender.  MS: No deformity or atrophy. Gait and ROM intact. Skin: Warm and dry. Color is normal.  Neuro:  Strength and sensation are intact and no gross focal deficits noted.  Psych: Alert, appropriate and with normal affect.  He has a ?rash under the left arm - nothing under the right.         LABORATORY DATA:  EKG:  EKG is not ordered today.  Lab Results  Component Value Date   WBC 5.1 03/15/2016   HGB 15.3 03/15/2016   HCT 45.2 03/15/2016   PLT 204 03/15/2016   GLUCOSE 94 03/15/2016   CHOL 180 03/12/2016   TRIG 38 03/12/2016   HDL 59 03/12/2016   LDLCALC 113* 03/12/2016   ALT 24 08/31/2012   AST 29 08/31/2012   NA 137 03/15/2016   K 3.9 03/15/2016   CL 107 03/15/2016   CREATININE 1.05 03/15/2016   BUN 16 03/15/2016   CO2 23 03/15/2016   PSA 1.16 08/04/2009   INR 1.07 03/14/2016   HGBA1C 5.7* 03/12/2016    BNP (last 3 results) No results for input(s): BNP in the last 8760 hours.  ProBNP (last 3 results) No results for input(s): PROBNP in the last 8760 hours.   Other Studies Reviewed Today: Procedures    Left Heart Cath and Coronary Angiography    Conclusion     2nd Mrg lesion, 99% stenosed. Post intervention, there is a 0% residual stenosis.  There is mild left ventricular systolic dysfunction.  1. Single vessel CAD with severe stenosis first obtuse marginal branch 2. Successful PTCA/DES x 1 first obtuse marginal branch 3. No obstructive disease in the RCA or LAD 4. Mild segmental LV systolic dysfunction  Recommendations: Will continue ASA and Brilinta for one year post PCI. Continue statin. No beta blocker with cocaine use. Likely discharge home tomorrow am.    Echo Study Conclusions from 03/2016  - Left ventricle: The cavity size was mildly dilated. Wall  thickness was normal. Systolic function was moderately reduced.  The estimated ejection fraction was in the range of 35% to 40%.  Diffuse hypokinesis.  There is severe hypokinesis of the  inferolateral myocardium. Features are consistent with a  pseudonormal left ventricular filling pattern, with concomitant  abnormal relaxation and increased filling pressure (grade 2  diastolic dysfunction).  Impressions:  - Global hypokinesis (worse in the inferior lateral wall) with  overall moderately reduced LV function; grade 2 diastolic  dysfunction; mild LVE; trace MR.   Assessment/Plan: 1. NSTEMI with PCI of the first OM - on DAPT. Doing ok. Lab  today. Did not get NTG - will reorder today.   2. Ischemic CM - only on low dose ACE. BP too soft to add additional medicines. Consider repeat echo in 3 months. Needs to show he can be compliant before proceeding with possible ICD if indicated.   3. Chronic LV systolic HF - seems fairly compensated at this time.   4. Multi substance abuse - total abstinence recommended and discussed at length today. Will see if he can be compliant.   5. HTN - actually soft. Would hold on titration of medicines for now.   6. HLD - on statin  7. ?Rash - I asked him to try cortisone cream  Current medicines are reviewed with the patient today.  The patient does not have concerns regarding medicines other than what has been noted above.  The following changes have been made:  See above.  Labs/ tests ordered today include:    Orders Placed This Encounter  Procedures  . Basic metabolic panel  . CBC  . Basic metabolic panel  . CBC     Disposition:   FU with Dr. Stanford Breed & his team in 4 to 6 weeks with fasting labs.    Patient is agreeable to this plan and will call if any problems develop in the interim.   Signed: Burtis Junes, RN, ANP-C 03/23/2016 2:27 PM  [facility] [address] [place], [place]  [address] Phone: [phone] Fax: [phone]

## 2016-03-23 NOTE — Patient Instructions (Addendum)
We will be checking the following labs today - BMET and CBC   Medication Instructions:    Continue with your current medicines.   Limit your use of Ibuprofen (Advil, motrin)  I sent in a RX for NTG to have on hand  Get over the counter hydrocortisone cream and apply twice daily under left arm    Testing/Procedures To Be Arranged:  N/A  Follow-Up:   See Dr. Stanford Breed in 4 to 6 weeks with fasting labs    Other Special Instructions:   Think about what we talked about today.     If you need a refill on your cardiac medications before your next appointment, please call your pharmacy.   Call the [place] office at [phone] if you have any questions, problems or concerns.

## 2016-04-01 ENCOUNTER — Telehealth: Payer: Self-pay | Admitting: Cardiology

## 2016-04-01 NOTE — Telephone Encounter (Signed)
Called patient back. He works for a [employer], [employer], [employer], patient stated they could put him on light duty, but he has no specific job.

## 2016-04-01 NOTE — Telephone Encounter (Signed)
Note written for light duty, no heavy lifting and to work only indoors.

## 2016-04-01 NOTE — Telephone Encounter (Signed)
Patient wants to get a doctor's note, stating he can return to work. Will forward to Remer Macho NP.

## 2016-04-01 NOTE — Telephone Encounter (Signed)
New message    Pt wants to know if he can get a letter stating he is released to go back to work. Please call.

## 2016-04-01 NOTE — Telephone Encounter (Signed)
What is he wanting to do in regards to work? He told me he did not work.

## 2016-04-01 NOTE — Telephone Encounter (Signed)
Called patient back. Informed patient that a note will be at the front desk for him at the [facility]. Patient stated he would pick it up on Monday morning.

## 2016-04-14 ENCOUNTER — Telehealth: Payer: Self-pay | Admitting: *Deleted

## 2016-04-14 NOTE — Telephone Encounter (Signed)
10 boxes of brilinta placed up front, pt aware

## 2016-04-14 NOTE — Telephone Encounter (Addendum)
10 boxes of brilinta placed of up, pt aware.

## 2016-04-29 ENCOUNTER — Ambulatory Visit (INDEPENDENT_AMBULATORY_CARE_PROVIDER_SITE_OTHER): Payer: No Typology Code available for payment source | Admitting: Physician Assistant

## 2016-04-29 ENCOUNTER — Encounter: Payer: Self-pay | Admitting: Physician Assistant

## 2016-04-29 VITALS — BP 115/72 | Ht 65.0 in | Wt 162.0 lb

## 2016-04-29 DIAGNOSIS — I2510 Atherosclerotic heart disease of native coronary artery without angina pectoris: Secondary | ICD-10-CM

## 2016-04-29 DIAGNOSIS — I10 Essential (primary) hypertension: Secondary | ICD-10-CM

## 2016-04-29 DIAGNOSIS — Z720 Tobacco use: Secondary | ICD-10-CM

## 2016-04-29 MED ORDER — LOSARTAN POTASSIUM 25 MG PO TABS: 25.0000 mg | ORAL_TABLET | Freq: Every day | ORAL | 6 refills | Status: DC

## 2016-04-29 NOTE — Patient Instructions (Addendum)
Monitor your blood pressure at home:  Blood pressure reading usually consist of an upper number (systolic blood pressure) and a lower number (diastolic blood pressure). Normal blood pressure is 120/80. You blood pressure today is 98/62. Focus on upper number (systolic blood pressure, in today's case is 98), once systolic blood pressure is < 85, people will have dizziness, weakness and feeling of passing out. You need to call your cardiologist's office if your systolic blood pressure is < 85.    Medication Change:  START Losartan 25 mg (1 tab) daily   Procedures  Your physician has requested that you have an echocardiogram. Echocardiography is a painless test that uses sound waves to create images of your heart. It provides your doctor with information about the size and shape of your heart and how well your heart's chambers and valves are working. This procedure takes approximately one hour. There are no restrictions for this procedure.  Follow-Up  Your physician recommends that you schedule a follow-up appointment in: 3 months with Dr. Stanford Breed.

## 2016-04-29 NOTE — Progress Notes (Signed)
Cardiology Office Note    Date:  04/29/2016   ID:  Lucas Stark, DOB 12-24-1955, MRN [id number]  PCP:  Dr. Cornelia Stark T? (patient cannot recall full name) Cardiologist:  Dr. Stanford Stark  Chief Complaint  Patient presents with  . Follow-up    seen for Dr. Stanford Stark    History of Present Illness:  Lucas Stark is a 60 y.o. male with PMH of uncontrolled HTN, multisubstance abuse, and recently diagnosed CAD in July 2017 after presented with NSTEMI. Cardiac catheterization in July showed 99% OM 2 lesion treated with 2.75 x 16 mm Synergy DES, he had otherwise normal coronaries, EF 45-50%. He had an echocardiogram obtained on 03/14/2016 showed EF 35-40%, severe hypokinesis of the inferolateral myocardium, grade 2 diastolic dysfunction. Lipid profile obtained on 7/1 showed cholesterol 180, LDL 113, triglycerides 38, HDL 59. He was last seen in the office on 7/12, his blood pressure was too soft to uptitrate medication.  He denies any significant chest discomfort. He did stop his lisinopril yesterday and today at the recommendation of his PCP due to cough which he think is related to tobacco abuse. He is trying to get some nicotine patches to help with that. He also cut back on tobacco. He has not touched alcohol since discharge. Despite previous records indicating cocaine use, he denies any illicit drug use. I will switch him to losartan 25 mg daily. His blood pressure is borderline today. I have asked him to monitor his blood pressure at home. He will need to call cardiology if his systolic blood pressures less than 85 or develops any symptoms such as dizziness, syncope and weakness. We will obtain a three-month repeat echocardiogram to reassess ejection fraction before he follows up with Dr. Stanford Stark.    Past Medical History:  Diagnosis Date  . CAD (coronary artery disease)    a. 03/2016 NSTEMI/PCI: LM nl, LAD nl, RI nl, LCX nl, OM2 99 ( 2.75 x 16 Synergy DES), RCA nl, EF 45-50%.  . Cocaine abuse   .  ETOH abuse   . Hyperlipidemia   . Hypertensive heart disease   . Ischemic cardiomyopathy    a. 03/2016 Echo: EF 35-40%, diff H, sev inflat HK, Gr2 DD;  b. 03/2016 EF 45-50% by LV gram.  . LVF (left ventricular failure) ([facility])   . Tobacco abuse     Past Surgical History:  Procedure Laterality Date  . CARDIAC CATHETERIZATION N/A 03/14/2016   Procedure: Left Heart Cath and Coronary Angiography;  Surgeon: Burnell Blanks, MD;  Location: [facility];  Service: Cardiovascular;  Laterality: N/A;  . No prior surgery      Current Medications: Outpatient Medications Prior to Visit  Medication Sig Dispense Refill  . aspirin EC 81 MG EC tablet Take 1 tablet (81 mg total) by mouth daily.    Marland Kitchen atorvastatin (LIPITOR) 80 MG tablet Take 1 tablet (80 mg total) by mouth at bedtime. 30 tablet 6  . nitroGLYCERIN (NITROSTAT) 0.4 MG SL tablet Place 1 tablet (0.4 mg total) under the tongue every 5 (five) minutes as needed for chest pain. 25 tablet 3  . ticagrelor (BRILINTA) 90 MG TABS tablet Take 1 tablet (90 mg total) by mouth 2 (two) times daily. 60 tablet 6  . ibuprofen (ADVIL,MOTRIN) 200 MG tablet Take 400 mg by mouth every 6 (six) hours as needed for moderate pain.    Marland Kitchen lisinopril (PRINIVIL,ZESTRIL) 5 MG tablet Take 1 tablet (5 mg total) by mouth daily. (Patient not taking:  Reported on 04/29/2016) 30 tablet 6   No facility-administered medications prior to visit.      Allergies:   Other   Social History   Social History  . Marital status: Legally Separated    Spouse name: N/A  . Number of children: 8  . Years of education: N/A   Social History Main Topics  . Smoking status: Current Some Day Smoker    Packs/day: 1.00    Years: 35.00    Types: Cigarettes  . Smokeless tobacco: None  . Alcohol use 0.0 oz/week     Comment: pint liquor (gin) daily  . Drug use:      Comment: cocaine  . Sexual activity: Not Asked   Other Topics Concern  . None   Social History Narrative  . None      Family History:  The patient's family history includes Heart attack in his father and sister.   ROS:   Please see the history of present illness.    ROS All other systems reviewed and are negative.   PHYSICAL EXAM:   VS:  BP 115/72   Ht 5\' 5"  (1.651 m)   Wt 162 lb (73.5 kg)   SpO2 90%   BMI 26.96 kg/m    GEN: Well nourished, well developed, in no acute distress  HEENT: normal  Neck: no JVD, carotid bruits, or masses Cardiac: RRR; no murmurs, rubs, or gallops,no edema  Respiratory:  clear to auscultation bilaterally, normal work of breathing GI: soft, nontender, nondistended, + BS MS: no deformity or atrophy  Skin: warm and dry, no rash Neuro:  Alert and Oriented x 3, Strength and sensation are intact Psych: euthymic mood, full affect  Wt Readings from Last 3 Encounters:  04/29/16 162 lb (73.5 kg)  03/23/16 164 lb 12.8 oz (74.8 kg)  03/14/16 165 lb 9.6 oz (75.1 kg)      Studies/Labs Reviewed:   EKG:  EKG is not ordered today.   Recent Labs: 03/23/2016: BUN 13; Creat 1.03; Hemoglobin 15.9; Platelets 239; Potassium 4.2; Sodium 140   Lipid Panel    Component Value Date/Time   CHOL 180 03/12/2016 1312   TRIG 38 03/12/2016 1312   HDL 59 03/12/2016 1312   CHOLHDL 3.1 03/12/2016 1312   VLDL 8 03/12/2016 1312   LDLCALC 113 (H) 03/12/2016 1312    Additional studies/ records that were reviewed today include:   Echo 03/14/2016 LV EF: 35% -   40%  ------------------------------------------------------------------- Indications:      Chest pain 786.51.  ------------------------------------------------------------------- History:   Risk factors:  Hypertension.  ------------------------------------------------------------------- Study Conclusions  - Left ventricle: The cavity size was mildly dilated. Wall   thickness was normal. Systolic function was moderately reduced.   The estimated ejection fraction was in the range of 35% to 40%.   Diffuse hypokinesis.  There is severe hypokinesis of the   inferolateral myocardium. Features are consistent with a   pseudonormal left ventricular filling pattern, with concomitant   abnormal relaxation and increased filling pressure (grade 2   diastolic dysfunction).  Impressions:  - Global hypokinesis (worse in the inferior lateral wall) with   overall moderately reduced LV function; grade 2 diastolic   dysfunction; mild LVE; trace MR.   Cath 03/14/2016 Conclusion    2nd Mrg lesion, 99% stenosed. Post intervention, there is a 0% residual stenosis.  There is mild left ventricular systolic dysfunction.   1. Single vessel CAD with severe stenosis first obtuse marginal branch 2. Successful PTCA/DES x 1  first obtuse marginal branch 3. No obstructive disease in the RCA or LAD 4. Mild segmental LV systolic dysfunction  Recommendations: Will continue ASA and Brilinta for one year post PCI. Continue statin. No beta blocker with cocaine use. Likely discharge home tomorrow am.         ASSESSMENT:    1. Coronary artery disease involving native coronary artery of native heart without angina pectoris   2. Essential hypertension   3. Tobacco abuse      PLAN:  In order of problems listed above:  1. CAD  - Cardiac catheterization in July showed 99% OM 2 lesion treated with 2.75 x 16 mm Synergy DES, he had otherwise normal coronaries, EF 45-50%.  - echocardiogram obtained on 03/14/2016 showed EF 35-40%, severe hypokinesis of the inferolateral myocardium, grade 2 diastolic dysfunction  - no obvious angina. Discussed with patient the need for compliance with DAPT. Will obtain repeat echo in 3 month prior to followup with Dr. Stanford Stark.  2. HTN: BP borderline, lisinopril stopped due to cough, will switch to losartan given his LV dysfunction  3. Tobacco abuse: he is cutting back and planning to start nicotine patches.    Medication Adjustments/Labs and Tests Ordered: Current medicines are reviewed at  length with the patient today.  Concerns regarding medicines are outlined above.  Medication changes, Labs and Tests ordered today are listed in the Patient Instructions below. Patient Instructions  Monitor your blood pressure at home:  Blood pressure reading usually consist of an upper number (systolic blood pressure) and a lower number (diastolic blood pressure). Normal blood pressure is 120/80. You blood pressure today is 98/62. Focus on upper number (systolic blood pressure, in today's case is 98), once systolic blood pressure is < 85, people will have dizziness, weakness and feeling of passing out. You need to call your cardiologist's office if your systolic blood pressure is < 85.    Medication Change:  START Losartan 25 mg (1 tab) daily   Procedures  Your physician has requested that you have an echocardiogram. Echocardiography is a painless test that uses sound waves to create images of your heart. It provides your doctor with information about the size and shape of your heart and how well your heart's chambers and valves are working. This procedure takes approximately one hour. There are no restrictions for this procedure.  Follow-Up  Your physician recommends that you schedule a follow-up appointment in: 3 months with Dr. Stanford Stark.     Hilbert Corrigan, [place]  04/29/2016 11:15 PM    [facility] [place], [place], [place]  [address] Phone: [phone]; Fax: [phone]

## 2016-05-10 ENCOUNTER — Other Ambulatory Visit (HOSPITAL_COMMUNITY): Payer: Self-pay

## 2016-05-10 ENCOUNTER — Telehealth (HOSPITAL_COMMUNITY): Payer: Self-pay | Admitting: Cardiology

## 2016-05-10 NOTE — Telephone Encounter (Signed)
Called pt to reschedule his echo appt which was scheduled too early and he inquired about taking some vitamins to help his energy level without counteracting with his current cardiac medications. Please f/u with pt today if possible.   Thanks

## 2016-05-10 NOTE — Telephone Encounter (Signed)
Spoke to patient.  Recommended heart healthy diet Patient states he does not have an appetite - has only one good meal since Sunday. Recommend more vegetables and fruit, lean meat  Low fat milk- if not eating a well balance meal may ask local pharmacist to suggest a mult vitamin.  Patient verbalized understanding. Patient will come by office to pick up a reference for heart health diet plan.

## 2016-05-27 ENCOUNTER — Telehealth: Payer: Self-pay | Admitting: *Deleted

## 2016-05-27 ENCOUNTER — Telehealth: Payer: Self-pay | Admitting: Cardiology

## 2016-05-27 NOTE — Telephone Encounter (Signed)
Patient came to the office for brilinta samples. Samples provided and also a patient assistance application.

## 2016-06-16 ENCOUNTER — Telehealth: Payer: Self-pay | Admitting: *Deleted

## 2016-06-16 NOTE — Telephone Encounter (Signed)
Patient called for brilinta samples. I am also leaving him another patient assistance application. He is aware that he needs to fill this out to see if he qualifies as I informed him that we are not able to provide samples every month.

## 2016-07-04 ENCOUNTER — Other Ambulatory Visit: Payer: Self-pay | Admitting: *Deleted

## 2016-07-04 ENCOUNTER — Telehealth: Payer: Self-pay | Admitting: Cardiology

## 2016-07-04 MED ORDER — TICAGRELOR 90 MG PO TABS: 90.0000 mg | ORAL_TABLET | Freq: Two times a day (BID) | ORAL | 3 refills | Status: DC

## 2016-07-04 NOTE — Telephone Encounter (Signed)
[place] prescription savings program forms to [employer].

## 2016-07-06 ENCOUNTER — Telehealth: Payer: Self-pay | Admitting: *Deleted

## 2016-07-06 NOTE — Telephone Encounter (Signed)
Patient assistance for brilinta faxed. Pt made aware

## 2016-07-18 ENCOUNTER — Ambulatory Visit (HOSPITAL_COMMUNITY): Payer: No Typology Code available for payment source | Attending: Cardiovascular Disease

## 2016-07-18 ENCOUNTER — Other Ambulatory Visit: Payer: Self-pay

## 2016-07-18 ENCOUNTER — Other Ambulatory Visit: Payer: Self-pay | Admitting: Physician Assistant

## 2016-07-18 DIAGNOSIS — I2510 Atherosclerotic heart disease of native coronary artery without angina pectoris: Secondary | ICD-10-CM | POA: Insufficient documentation

## 2016-07-19 ENCOUNTER — Telehealth: Payer: Self-pay | Admitting: Cardiology

## 2016-07-19 MED ORDER — TICAGRELOR 90 MG PO TABS: 90.0000 mg | ORAL_TABLET | Freq: Two times a day (BID) | ORAL | 1 refills | Status: DC

## 2016-07-19 MED ORDER — ATORVASTATIN CALCIUM 80 MG PO TABS: 80.0000 mg | ORAL_TABLET | Freq: Every day | ORAL | 1 refills | Status: DC

## 2016-07-19 NOTE — Telephone Encounter (Signed)
NEW MESSAGE  .[place]

## 2016-07-19 NOTE — Telephone Encounter (Signed)
NEW MESSAGE   *STAT* If patient is at the pharmacy, call can be transferred to refill team.   1. Which medications need to be refilled? (please list name of each medication and dose if known) ATORVASTATIN AND BRILINTA  2. Which pharmacy/location (including street and city if local pharmacy) is medication to be sent to? DID NOT SAY  3. Do they need a 30 day or 90 day supply? [place]

## 2016-07-19 NOTE — Telephone Encounter (Signed)
NEW MESSAGE  .[facility]

## 2016-07-19 NOTE — Telephone Encounter (Signed)
Called patient. He needed Rx sent to pharmacy for atorva and samples of Brilinta. Supplied pt samples - he is aware he may pick up tomorrow.

## 2016-07-22 ENCOUNTER — Encounter: Payer: Self-pay | Admitting: Cardiology

## 2016-07-22 ENCOUNTER — Ambulatory Visit (INDEPENDENT_AMBULATORY_CARE_PROVIDER_SITE_OTHER): Payer: No Typology Code available for payment source | Admitting: Cardiology

## 2016-07-22 VITALS — BP 98/62 | HR 66 | Ht 65.0 in | Wt 168.0 lb

## 2016-07-22 DIAGNOSIS — E785 Hyperlipidemia, unspecified: Secondary | ICD-10-CM

## 2016-07-22 DIAGNOSIS — Z720 Tobacco use: Secondary | ICD-10-CM

## 2016-07-22 DIAGNOSIS — I10 Essential (primary) hypertension: Secondary | ICD-10-CM

## 2016-07-22 DIAGNOSIS — I2510 Atherosclerotic heart disease of native coronary artery without angina pectoris: Secondary | ICD-10-CM

## 2016-07-22 MED ORDER — LOSARTAN POTASSIUM 25 MG PO TABS: 25.0000 mg | ORAL_TABLET | Freq: Every day | ORAL | 6 refills | Status: DC

## 2016-07-22 NOTE — Patient Instructions (Signed)
Medication Instructions:   NO CHANGE  Labwork:  Your physician recommends that you return for lab work WHEN FASTING  Follow-Up:  Your physician wants you to follow-up in: 6 MONTHS WITH DR CRENSHAW You will receive a reminder letter in the mail two months in advance. If you don't receive a letter, please call our office to schedule the follow-up appointment.   If you need a refill on your cardiac medications before your next appointment, please call your pharmacy.  

## 2016-07-22 NOTE — Progress Notes (Signed)
HPI: Follow-up coronary artery disease. Patient recently admitted with non-ST elevation myocardial infarction. Echocardiogram July 2017 showed ejection fraction 35-40% and grade 2 diastolic dysfunction. Cardiac catheterization July 2017 showed mild LV systolic dysfunction, 123456 second marginal. Patient had PCI of the second marginal with a drug-eluting stent. Echocardiogram November 2017 showed ejection fraction 50-55%. Patient had cough with Cozaar. Since last seen, the patient denies any dyspnea on exertion, orthopnea, PND, pedal edema, palpitations, syncope or chest pain.   Current Outpatient Prescriptions  Medication Sig Dispense Refill  . aspirin EC 81 MG EC tablet Take 1 tablet (81 mg total) by mouth daily.    Marland Kitchen atorvastatin (LIPITOR) 80 MG tablet Take 1 tablet (80 mg total) by mouth at bedtime. 90 tablet 1  . losartan (COZAAR) 25 MG tablet Take 1 tablet (25 mg total) by mouth daily. 30 tablet 6  . nitroGLYCERIN (NITROSTAT) 0.4 MG SL tablet Place 1 tablet (0.4 mg total) under the tongue every 5 (five) minutes as needed for chest pain. 25 tablet 3  . ticagrelor (BRILINTA) 90 MG TABS tablet Take 1 tablet (90 mg total) by mouth 2 (two) times daily. 180 tablet 1   No current facility-administered medications for this visit.      Past Medical History:  Diagnosis Date  . CAD (coronary artery disease)    a. 03/2016 NSTEMI/PCI: LM nl, LAD nl, RI nl, LCX nl, OM2 99 ( 2.75 x 16 Synergy DES), RCA nl, EF 45-50%.  . Cocaine abuse   . ETOH abuse   . Hyperlipidemia   . Hypertensive heart disease   . Ischemic cardiomyopathy    a. 03/2016 Echo: EF 35-40%, diff H, sev inflat HK, Gr2 DD;  b. 03/2016 EF 45-50% by LV gram.  . LVF (left ventricular failure) ([place])   . Tobacco abuse     Past Surgical History:  Procedure Laterality Date  . CARDIAC CATHETERIZATION N/A 03/14/2016   Procedure: Left Heart Cath and Coronary Angiography;  Surgeon: Burnell Blanks, MD;  Location: [facility] [facility];   Service: Cardiovascular;  Laterality: N/A;  . No prior surgery      Social History   Social History  . Marital status: Legally Separated    Spouse name: N/A  . Number of children: 8  . Years of education: N/A   Occupational History  . Not on file.   Social History Main Topics  . Smoking status: Current Some Day Smoker    Packs/day: 1.00    Years: 35.00    Types: Cigarettes  . Smokeless tobacco: Never Used  . Alcohol use 0.0 oz/week     Comment: pint liquor (gin) daily  . Drug use:      Comment: cocaine  . Sexual activity: Not on file   Other Topics Concern  . Not on file   Social History Narrative  . No narrative on file    Family History  Problem Relation Age of Onset  . Heart attack Father   . Heart attack Sister     ROS: no fevers or chills, productive cough, hemoptysis, dysphasia, odynophagia, melena, hematochezia, dysuria, hematuria, rash, seizure activity, orthopnea, PND, pedal edema, claudication. Remaining systems are negative.  Physical Exam: Well-developed well-nourished in no acute distress.  Skin is warm and dry.  HEENT is normal.  Neck is supple.  Chest is clear to auscultation with normal expansion.  Cardiovascular exam is regular rate and rhythm.  Abdominal exam nontender or distended. No masses palpated. Extremities show  no edema. neuro grossly intact  A/P  1 coronary artery disease-continue aspirin, brilinta and statin. DC brilinta 12 months from time of PCI.  2 Ischemic cardiomyopathy-improved on most recent echocardiogram; continue ACE inhibitor. We have not added a beta blocker as his blood pressure is borderline.  3 tobacco abuse-patient counseled on discontinuing.  4 hyperlipidemia-continue statin. Check lipids and liver.  Kirk Ruths, MD

## 2016-08-01 ENCOUNTER — Ambulatory Visit: Payer: No Typology Code available for payment source | Admitting: Cardiology

## 2016-08-26 ENCOUNTER — Encounter: Payer: Self-pay | Admitting: *Deleted

## 2016-09-22 ENCOUNTER — Other Ambulatory Visit: Payer: Self-pay | Admitting: Cardiology

## 2016-09-22 ENCOUNTER — Other Ambulatory Visit (HOSPITAL_COMMUNITY): Payer: Self-pay | Admitting: Internal Medicine

## 2016-09-22 ENCOUNTER — Ambulatory Visit (HOSPITAL_COMMUNITY)
Admission: RE | Admit: 2016-09-22 | Discharge: 2016-09-22 | Disposition: A | Discharge status: Home / Self Care | Payer: No Typology Code available for payment source | Source: Ambulatory Visit | Attending: Internal Medicine | Admitting: Internal Medicine

## 2016-09-22 DIAGNOSIS — R531 Weakness: Secondary | ICD-10-CM

## 2016-09-22 DIAGNOSIS — G8929 Other chronic pain: Secondary | ICD-10-CM

## 2016-09-22 DIAGNOSIS — R079 Chest pain, unspecified: Secondary | ICD-10-CM

## 2016-09-22 DIAGNOSIS — M25512 Pain in left shoulder: Secondary | ICD-10-CM

## 2016-09-22 DIAGNOSIS — Z029 Encounter for administrative examinations, unspecified: Secondary | ICD-10-CM | POA: Insufficient documentation

## 2016-10-21 ENCOUNTER — Telehealth: Payer: Self-pay | Admitting: *Deleted

## 2016-10-21 NOTE — Telephone Encounter (Signed)
He called reporting he has the patient on losartan and lisinopril. Advised we have not seen the patient since November. He reports the lisinopril script was written prior to the losartan script. Advised to stop lisinopril and use losartan.

## 2016-10-25 ENCOUNTER — Ambulatory Visit (HOSPITAL_COMMUNITY)
Admission: EM | Admit: 2016-10-25 | Discharge: 2016-10-25 | Disposition: A | Discharge status: Home / Self Care | Payer: Medicaid Other | Attending: Family Medicine | Admitting: Family Medicine

## 2016-10-25 ENCOUNTER — Encounter (HOSPITAL_COMMUNITY): Payer: Self-pay | Admitting: Emergency Medicine

## 2016-10-25 DIAGNOSIS — R05 Cough: Secondary | ICD-10-CM

## 2016-10-25 DIAGNOSIS — J209 Acute bronchitis, unspecified: Secondary | ICD-10-CM

## 2016-10-25 DIAGNOSIS — R059 Cough, unspecified: Secondary | ICD-10-CM

## 2016-10-25 MED ORDER — BENZONATATE 100 MG PO CAPS: 200.0000 mg | ORAL_CAPSULE | Freq: Three times a day (TID) | ORAL | 0 refills | Status: DC | PRN

## 2016-10-25 MED ORDER — AMOXICILLIN 875 MG PO TABS: 875.0000 mg | ORAL_TABLET | Freq: Two times a day (BID) | ORAL | 0 refills | Status: DC

## 2016-10-25 NOTE — ED Triage Notes (Signed)
The patient presented to the [facility] with a cough that started when he quit smoking and started the nicotine patch.

## 2016-10-25 NOTE — ED Provider Notes (Signed)
CSN: [id number]     Arrival date & time 10/25/16  1239 History   None    Chief Complaint  Patient presents with  . Cough   (Consider location/radiation/quality/duration/timing/severity/associated sxs/prior Treatment) Patient c/o cough and uri sx's for last week.   The history is provided by the patient.  Cough  Cough characteristics:  Non-productive Sputum characteristics:  White Severity:  Mild Onset quality:  Sudden Duration:  1 week Timing:  Constant Progression:  Worsening Chronicity:  New Smoker: yes   Context: upper respiratory infection and weather changes   Relieved by:  Nothing Worsened by:  Nothing Ineffective treatments:  None tried   Past Medical History:  Diagnosis Date  . CAD (coronary artery disease)    a. 03/2016 NSTEMI/PCI: LM nl, LAD nl, RI nl, LCX nl, OM2 99 ( 2.75 x 16 Synergy DES), RCA nl, EF 45-50%.  . Cocaine abuse   . ETOH abuse   . Hyperlipidemia   . Hypertensive heart disease   . Ischemic cardiomyopathy    a. 03/2016 Echo: EF 35-40%, diff H, sev inflat HK, Gr2 DD;  b. 03/2016 EF 45-50% by LV gram.  . LVF (left ventricular failure) ([place])   . Tobacco abuse    Past Surgical History:  Procedure Laterality Date  . CARDIAC CATHETERIZATION N/A 03/14/2016   Procedure: Left Heart Cath and Coronary Angiography;  Surgeon: Burnell Blanks, MD;  Location: [place] [facility];  Service: Cardiovascular;  Laterality: N/A;  . No prior surgery     Family History  Problem Relation Age of Onset  . Heart attack Father   . Heart attack Sister    Social History  Substance Use Topics  . Smoking status: Current Some Day Smoker    Packs/day: 1.00    Years: 35.00    Types: Cigarettes  . Smokeless tobacco: Never Used  . Alcohol use 0.0 oz/week     Comment: pint liquor (gin) daily    Review of Systems  Constitutional: Negative.   HENT: Negative.   Eyes: Negative.   Respiratory: Positive for cough.   Cardiovascular: Negative.   Gastrointestinal:  Negative.   Endocrine: Negative.   Genitourinary: Negative.   Musculoskeletal: Negative.   Allergic/Immunologic: Negative.   Neurological: Negative.   Hematological: Negative.   Psychiatric/Behavioral: Negative.     Allergies  Other  Home Medications   Prior to Admission medications   Medication Sig Start Date End Date Taking? Authorizing Provider  aspirin EC 81 MG EC tablet Take 1 tablet (81 mg total) by mouth daily. 03/15/16  Yes Rogelia Mire, NP  atorvastatin (LIPITOR) 80 MG tablet Take 1 tablet (80 mg total) by mouth at bedtime. 07/19/16  Yes Lelon Perla, MD  losartan (COZAAR) 25 MG tablet Take 1 tablet (25 mg total) by mouth daily. 07/22/16 02/17/17 Yes Lelon Perla, MD  ticagrelor (BRILINTA) 90 MG TABS tablet Take 1 tablet (90 mg total) by mouth 2 (two) times daily. 07/19/16  Yes Lelon Perla, MD  amoxicillin (AMOXIL) 875 MG tablet Take 1 tablet (875 mg total) by mouth 2 (two) times daily. 10/25/16   Lysbeth Penner, FNP  benzonatate (TESSALON) 100 MG capsule Take 2 capsules (200 mg total) by mouth 3 (three) times daily as needed for cough. 10/25/16   Lysbeth Penner, FNP  nitroGLYCERIN (NITROSTAT) 0.4 MG SL tablet Place 1 tablet (0.4 mg total) under the tongue every 5 (five) minutes as needed for chest pain. 03/23/16   Burtis Junes, NP  Meds Ordered and Administered this Visit  Medications - No data to display  BP 110/59 (BP Location: Right Arm)   Pulse 99   Temp 98.9 F (37.2 C) (Oral)   Resp 20   SpO2 98%  No data found.   Physical Exam  Constitutional: He appears well-developed and well-nourished.  HENT:  Head: Normocephalic and atraumatic.  Right Ear: External ear normal.  Left Ear: External ear normal.  Mouth/Throat: Oropharynx is clear and moist.  Eyes: Conjunctivae and EOM are normal. Pupils are equal, round, and reactive to light.  Neck: Normal range of motion. Neck supple.  Cardiovascular: Normal rate, regular rhythm and normal heart  sounds.   Pulmonary/Chest: Effort normal and breath sounds normal.  Abdominal: Soft. Bowel sounds are normal.  Nursing note and vitals reviewed.   Urgent Care Course     Procedures (including critical care time)  Labs Review Labs Reviewed - No data to display  Imaging Review No results found.   Visual Acuity Review  Right Eye Distance:   Left Eye Distance:   Bilateral Distance:    Right Eye Near:   Left Eye Near:    Bilateral Near:         MDM   1. Cough   2. Acute bronchitis, unspecified organism    amoxicillin Tessalon  Push po fluids, rest, tylenol and motrin otc prn as directed for fever, arthralgias, and myalgias.  Follow up prn if sx's continue or persist.    Lysbeth Penner, FNP 10/25/16 [phone]

## 2016-11-18 ENCOUNTER — Other Ambulatory Visit (HOSPITAL_COMMUNITY): Payer: Self-pay | Admitting: Internal Medicine

## 2016-11-18 DIAGNOSIS — M25512 Pain in left shoulder: Secondary | ICD-10-CM

## 2017-03-02 ENCOUNTER — Other Ambulatory Visit: Payer: Self-pay | Admitting: Cardiology

## 2017-03-02 ENCOUNTER — Other Ambulatory Visit: Payer: Self-pay | Admitting: Physician Assistant

## 2017-03-02 NOTE — Telephone Encounter (Signed)
Rx(s) sent to pharmacy electronically.  

## 2017-03-02 NOTE — Telephone Encounter (Signed)
Refill Request.  

## 2017-04-11 ENCOUNTER — Other Ambulatory Visit: Payer: Self-pay | Admitting: Cardiology

## 2017-05-10 ENCOUNTER — Other Ambulatory Visit: Payer: Self-pay | Admitting: Cardiology

## 2017-06-05 ENCOUNTER — Emergency Department (HOSPITAL_COMMUNITY)
Admission: EM | Admit: 2017-06-05 | Discharge: 2017-06-05 | Disposition: A | Discharge status: Home / Self Care | Payer: Medicaid Other | Attending: Emergency Medicine | Admitting: Emergency Medicine

## 2017-06-05 ENCOUNTER — Encounter (HOSPITAL_COMMUNITY): Payer: Self-pay | Admitting: Emergency Medicine

## 2017-06-05 DIAGNOSIS — M25551 Pain in right hip: Secondary | ICD-10-CM | POA: Insufficient documentation

## 2017-06-05 DIAGNOSIS — Z955 Presence of coronary angioplasty implant and graft: Secondary | ICD-10-CM | POA: Insufficient documentation

## 2017-06-05 DIAGNOSIS — I2510 Atherosclerotic heart disease of native coronary artery without angina pectoris: Secondary | ICD-10-CM | POA: Diagnosis not present

## 2017-06-05 DIAGNOSIS — M545 Low back pain: Secondary | ICD-10-CM | POA: Diagnosis not present

## 2017-06-05 DIAGNOSIS — Z79899 Other long term (current) drug therapy: Secondary | ICD-10-CM | POA: Insufficient documentation

## 2017-06-05 DIAGNOSIS — F17210 Nicotine dependence, cigarettes, uncomplicated: Secondary | ICD-10-CM | POA: Diagnosis not present

## 2017-06-05 DIAGNOSIS — Z7982 Long term (current) use of aspirin: Secondary | ICD-10-CM | POA: Diagnosis not present

## 2017-06-05 DIAGNOSIS — I10 Essential (primary) hypertension: Secondary | ICD-10-CM | POA: Insufficient documentation

## 2017-06-05 MED ORDER — LIDOCAINE-MENTHOL 3.99-1.25 % EX PTCH: 1.0000 | MEDICATED_PATCH | Freq: Every day | CUTANEOUS | 0 refills | Status: DC | PRN

## 2017-06-05 MED ORDER — NAPROXEN 375 MG PO TABS: 375.0000 mg | ORAL_TABLET | Freq: Two times a day (BID) | ORAL | 0 refills | Status: DC

## 2017-06-05 MED ORDER — CYCLOBENZAPRINE HCL 10 MG PO TABS: 10.0000 mg | ORAL_TABLET | Freq: Two times a day (BID) | ORAL | 0 refills | Status: DC | PRN

## 2017-06-05 NOTE — Discharge Instructions (Signed)
Your symptoms seem consistent with what is called sciatic nerve pain. Would like for you to try anti-inflammatories and muscle relaxers. Have given you stretches to perform as well.  Please the the Flexeril for muscle relaxation. This medication will make you drowsy so avoid situation that could place you in danger.   Please take the Naproxen as prescribed for pain. Do not take any additional NSAIDs including Motrin, Aleve, Ibuprofen, Advil. May take tyleno follow-up with orthopedist and primary care doctor if symptoms persist.   Workup has been normal. Please take medications as prescribed and instructed.  SEEK IMMEDIATE MEDICAL ATTENTION IF: New numbness, tingling, weakness, or problem with the use of your arms or legs.  Severe back pain not relieved with medications.  Change in bowel or bladder control.  Urinary retention.  Numbness in your groin.  Increasing pain in any areas of the body (such as chest or abdominal pain).  Shortness of breath, dizziness or fainting.  Nausea (feeling sick to your stomach), vomiting, fever, or sweats.

## 2017-06-05 NOTE — ED Triage Notes (Signed)
Per EMS-states right hip pain for weeks-states no injury-history of chronic back pain, ambulated to ambulance without difficulty

## 2017-06-05 NOTE — ED Provider Notes (Signed)
[facility] DE[facility] Provider Note   CSN: [id number] Arrival date & time: 06/05/17  0802     History   Chief Complaint Chief Complaint  Patient presents with  . Hip Pain    HPI Lucas Stark is a 61 y.o. male.  HPI 61 year old African-American male past medical history significant for chronic low back pain presents to the emergency department today with complaints of right hip pain. Patient states the pain starts in his right lower back and radiates down his right buttocks into his right leg. Patient states that the pain is shooting in nature. Worse with ambulation and prolonged sitting. Patient states this pain has been ongoing for months to years. The patient states he was seen prior for it and had x-rays done but unsure with x-ray showed. I did review the x-rays in Epic of bilateral hips that showed no acute abnormalities. Patient states he's been trying Tylenol and NSAIDs at home with little relief. He has not followed up with his primary care doctor or orthopedist. Pt denies any ha, night sweats, hx of ivdu/cancer, loss or bowel or bladder, urinary retention, saddle paresthesias, lower extremity paresthesias. Patient denies any lower extremity edema or calf tenderness. Denies any chronic steroid use. Denies any recent trauma.  Past Medical History:  Diagnosis Date  . CAD (coronary artery disease)    a. 03/2016 NSTEMI/PCI: LM nl, LAD nl, RI nl, LCX nl, OM2 99 ( 2.75 x 16 Synergy DES), RCA nl, EF 45-50%.  . Cocaine abuse   . ETOH abuse   . Hyperlipidemia   . Hypertensive heart disease   . Ischemic cardiomyopathy    a. 03/2016 Echo: EF 35-40%, diff H, sev inflat HK, Gr2 DD;  b. 03/2016 EF 45-50% by LV gram.  . LVF (left ventricular failure) ([place])   . Tobacco abuse     Patient Active Problem List   Diagnosis Date Noted  . Hypertensive heart disease without CHF 03/15/2016  . Hyperlipidemia 03/15/2016  . Coronary artery disease with angina pectoris ([place]) 03/15/2016  .  Cardiomyopathy, ischemic 03/15/2016  . LVF (left ventricular failure) ([place]) 03/15/2016  . Status post coronary artery stent placement   . NSTEMI (non-ST elevated myocardial infarction) ([place]) 03/12/2016  . Benign essential HTN 03/12/2016  . Tobacco abuse 03/12/2016  . Cocaine use 03/12/2016  . Alcohol abuse 03/12/2016    Past Surgical History:  Procedure Laterality Date  . CARDIAC CATHETERIZATION N/A 03/14/2016   Procedure: Left Heart Cath and Coronary Angiography;  Surgeon: Burnell Blanks, MD;  Location: [place] [facility];  Service: Cardiovascular;  Laterality: N/A;  . No prior surgery         Home Medications    Prior to Admission medications   Medication Sig Start Date End Date Taking? Authorizing Provider  aspirin EC 81 MG EC tablet Take 1 tablet (81 mg total) by mouth daily. 03/15/16  Yes Rogelia Mire, NP  BRILINTA 90 MG TABS tablet TAKE 1 TABLET BY MOUTH 2 TIMES DAILY 03/02/17  Yes Lelon Perla, MD  losartan (COZAAR) 25 MG tablet Take 25 mg by mouth daily. 05/10/17  Yes [provider]  Multiple Vitamins-Minerals (MULTIVITAMIN ADULTS PO) Take 1 tablet by mouth daily.   Yes [provider]  amoxicillin (AMOXIL) 875 MG tablet Take 1 tablet (875 mg total) by mouth 2 (two) times daily. Patient not taking: Reported on 06/05/2017 10/25/16   Lysbeth Penner, FNP  atorvastatin (LIPITOR) 80 MG tablet Take 1 tablet (80 mg total)  by mouth at bedtime. Schedule appoints for further refills 05/10/17   Lelon Perla, MD  benzonatate (TESSALON) 100 MG capsule Take 2 capsules (200 mg total) by mouth 3 (three) times daily as needed for cough. Patient not taking: Reported on 06/05/2017 10/25/16   Lysbeth Penner, FNP  cyclobenzaprine (FLEXERIL) 10 MG tablet Take 1 tablet (10 mg total) by mouth 2 (two) times daily as needed for muscle spasms. 06/05/17   Doristine Devoid, PA-C  Lidocaine-Menthol [facility] PAIN RELIEF) 3.99-1.25 % PTCH Apply 1 patch topically  daily as needed. 06/05/17   Doristine Devoid, PA-C  losartan (COZAAR) 25 MG tablet Take 1 tablet (25 mg total) by mouth daily. 07/22/16 02/17/17  Lelon Perla, MD  naproxen (NAPROSYN) 375 MG tablet Take 1 tablet (375 mg total) by mouth 2 (two) times daily. 06/05/17   Doristine Devoid, PA-C  nitroGLYCERIN (NITROSTAT) 0.4 MG SL tablet Place 1 tablet (0.4 mg total) under the tongue every 5 (five) minutes as needed for chest pain. 03/23/16   Burtis Junes, NP    Family History Family History  Problem Relation Age of Onset  . Heart attack Father   . Heart attack Sister     Social History Social History  Substance Use Topics  . Smoking status: Current Some Day Smoker    Packs/day: 1.00    Years: 35.00    Types: Cigarettes  . Smokeless tobacco: Never Used  . Alcohol use 0.0 oz/week     Comment: pint liquor (gin) daily     Allergies   Other   Review of Systems Review of Systems  Constitutional: Negative for chills and fever.  Genitourinary: Negative for decreased urine volume, dysuria, frequency, hematuria and urgency.  Musculoskeletal: Positive for arthralgias, back pain, gait problem and myalgias. Negative for joint swelling.  Skin: Negative for color change.  Neurological: Negative for weakness and numbness.     Physical Exam Updated Vital Signs BP (!) 141/94 (BP Location: Right Arm)   Pulse (!) 50   Temp 98.5 F (36.9 C) (Oral)   Resp 17   Ht 5\' 6"  (1.676 m)   Wt 74.8 kg (165 lb)   SpO2 100%   BMI 26.63 kg/m   Physical Exam  Constitutional: He appears well-developed and well-nourished. No distress.  HENT:  Head: Normocephalic and atraumatic.  Eyes: Right eye exhibits no discharge. Left eye exhibits no discharge. No scleral icterus.  Neck: Normal range of motion.  Cardiovascular: Intact distal pulses.   Pulmonary/Chest: No respiratory distress.  Musculoskeletal: Normal range of motion.  No midline T spine or L spine tenderness. No deformities or step  offs noted. Full ROM. Pelvis is stable.  Patient was positive straight leg raise test on the right. This reproduces patient's pain that shoots from hip down to his foot.  No lower extremity edema or calf tenderness. DP pulses are 2+ bilaterally. Sensation intact. Cap refill normal. Legs are warm to touch. No overlying erythema or warmth. Patient is ambulatory.  Neurological: He is alert.  Strength 5 out of 5 in lower extremities.  Skin: Skin is warm and dry. Capillary refill takes less than 2 seconds. No pallor.  Psychiatric: His behavior is normal. Judgment and thought content normal.  Nursing note and vitals reviewed.    ED Treatments / Results  Labs (all labs ordered are listed, but only abnormal results are displayed) Labs Reviewed - No data to display  EKG  EKG Interpretation None  Radiology No results found.  Procedures Procedures (including critical care time)  Medications Ordered in ED Medications - No data to display   Initial Impression / Assessment and Plan / ED Course  I have reviewed the triage vital signs and the nursing notes.  Pertinent labs & imaging results that were available during my care of the patient were reviewed by me and considered in my medical decision making (see chart for details).     Patient with back pain and right hip pain. The pain is no longer several months to years. History of chronic back pain..  No neurological deficits and normal neuro exam.  Neurovascularly intact. Patient can walk but states is painful.  No loss of bowel or bladder control.  No concern for cauda equina.  No fever, night sweats, weight loss, h/o cancer, IVDU. The patient did have an x-ray April 2018 for same. Has not followed up with primary care orthopedics. Patient has no lower extremity edema or calf tenderness noted concerning for DVT. Normal DP pulses and warm to touch. Doubt arterial occlusion. Discussed imaging with patient he states he would not like to  have x-rays at this time. Encouraged to treat with RICE protocol and pain medicine indicated. Encourage patient to follow-up with his PCP and orthopedist.  Pt is hemodynamically stable, in NAD, & able to ambulate in the ED. Evaluation does not show pathology that would require ongoing emergent intervention or inpatient treatment. I explained the diagnosis to the patient. Pain has been managed & has no complaints prior to dc. Pt is comfortable with above plan and is stable for discharge at this time. All questions were answered prior to disposition. Strict return precautions for f/u to the ED were discussed. Encouraged follow up with PCP.    Final Clinical Impressions(s) / ED Diagnoses   Final diagnoses:  Right hip pain    New Prescriptions Discharge Medication List as of 06/05/2017 12:35 PM    START taking these medications   Details  cyclobenzaprine (FLEXERIL) 10 MG tablet Take 1 tablet (10 mg total) by mouth 2 (two) times daily as needed for muscle spasms., Starting Mon 06/05/2017, Print    naproxen (NAPROSYN) 375 MG tablet Take 1 tablet (375 mg total) by mouth 2 (two) times daily., Starting Mon 06/05/2017, Print         Doristine Devoid, PA-C 06/05/17 1308    Fatima Blank, MD 06/05/17 1755

## 2017-06-15 ENCOUNTER — Other Ambulatory Visit: Payer: Self-pay | Admitting: Cardiology

## 2017-07-04 ENCOUNTER — Emergency Department (HOSPITAL_COMMUNITY)
Admission: EM | Admit: 2017-07-04 | Discharge: 2017-07-04 | Disposition: A | Discharge status: Home / Self Care | Payer: Medicaid Other | Attending: Emergency Medicine | Admitting: Emergency Medicine

## 2017-07-04 ENCOUNTER — Encounter (HOSPITAL_COMMUNITY): Payer: Self-pay | Admitting: Emergency Medicine

## 2017-07-04 DIAGNOSIS — I119 Hypertensive heart disease without heart failure: Secondary | ICD-10-CM | POA: Diagnosis not present

## 2017-07-04 DIAGNOSIS — I25119 Atherosclerotic heart disease of native coronary artery with unspecified angina pectoris: Secondary | ICD-10-CM | POA: Insufficient documentation

## 2017-07-04 DIAGNOSIS — Z79899 Other long term (current) drug therapy: Secondary | ICD-10-CM | POA: Diagnosis not present

## 2017-07-04 DIAGNOSIS — Z955 Presence of coronary angioplasty implant and graft: Secondary | ICD-10-CM | POA: Diagnosis not present

## 2017-07-04 DIAGNOSIS — M5431 Sciatica, right side: Secondary | ICD-10-CM

## 2017-07-04 DIAGNOSIS — F17210 Nicotine dependence, cigarettes, uncomplicated: Secondary | ICD-10-CM | POA: Insufficient documentation

## 2017-07-04 DIAGNOSIS — Z7982 Long term (current) use of aspirin: Secondary | ICD-10-CM | POA: Insufficient documentation

## 2017-07-04 DIAGNOSIS — M25551 Pain in right hip: Secondary | ICD-10-CM | POA: Diagnosis present

## 2017-07-04 NOTE — ED Provider Notes (Signed)
[facility] DEPT Provider Note   CSN: [id number] Arrival date & time: 07/04/17  1140     History   Chief Complaint Chief Complaint  Patient presents with  . Hip Pain    right    HPI LORON WEIMER is a 61 y.o. male.  HPI   Mr. Ducharme is a 61 year old male with a history of chronic right hip pain, hypertension, hyperlipidemia, CAD who presents the emergency department for evaluation of ongoing right hip pain. He was seen for this complaint about a month ago, was treated with NSAIDs, muscle relaxer and told to follow up with his primary doctor. He states that his symptoms have persisted since that time and "my primary doctor isn't helping me." States that he has 7/10 "shooting" right hip pain which radiates down his right buttocks and into the right leg. He reports paresthesias in the right foot which has been ongoing for years. It is worsened with ambulation or sitting for long periods. He is taking Naprosyn, Flexeril and applies a lidocaine patch to the right hip without significant relief. Got an x-ray of his right hip April, 2018 which was normal. States that his primary doctor was going to refer him to orthopedics, but he hasn't heard anything. He is able to ambulate independently although painful. No fever, fatigue, night sweats, numbness, weakness, loss of bowel or bladder control, saddle anesthesia, leg swelling, abdominal pain, nausea/vomiting. No recent injury, denies chronic steroid use. Denies IV drug use and history of cancer.  Past Medical History:  Diagnosis Date  . CAD (coronary artery disease)    a. 03/2016 NSTEMI/PCI: LM nl, LAD nl, RI nl, LCX nl, OM2 99 ( 2.75 x 16 Synergy DES), RCA nl, EF 45-50%.  . Cocaine abuse ([place])   . ETOH abuse   . Hyperlipidemia   . Hypertensive heart disease   . Ischemic cardiomyopathy    a. 03/2016 Echo: EF 35-40%, diff H, sev inflat HK, Gr2 DD;  b. 03/2016 EF 45-50% by LV gram.  . LVF (left ventricular failure)  ([place])   . Tobacco abuse     Patient Active Problem List   Diagnosis Date Noted  . Hypertensive heart disease without CHF 03/15/2016  . Hyperlipidemia 03/15/2016  . Coronary artery disease with angina pectoris ([place]) 03/15/2016  . Cardiomyopathy, ischemic 03/15/2016  . LVF (left ventricular failure) ([place]) 03/15/2016  . Status post coronary artery stent placement   . NSTEMI (non-ST elevated myocardial infarction) ([place]) 03/12/2016  . Benign essential HTN 03/12/2016  . Tobacco abuse 03/12/2016  . Cocaine use 03/12/2016  . Alcohol abuse 03/12/2016    Past Surgical History:  Procedure Laterality Date  . CARDIAC CATHETERIZATION N/A 03/14/2016   Procedure: Left Heart Cath and Coronary Angiography;  Surgeon: Burnell Blanks, MD;  Location: [facility];  Service: Cardiovascular;  Laterality: N/A;  . No prior surgery         Home Medications    Prior to Admission medications   Medication Sig Start Date End Date Taking? Authorizing Provider  amoxicillin (AMOXIL) 875 MG tablet Take 1 tablet (875 mg total) by mouth 2 (two) times daily. Patient not taking: Reported on 06/05/2017 10/25/16   Lysbeth Penner, FNP  aspirin EC 81 MG EC tablet Take 1 tablet (81 mg total) by mouth daily. 03/15/16   Rogelia Mire, NP  atorvastatin (LIPITOR) 80 MG tablet TAKE 1 TABLET BY MOUTH AT BEDTIME **Needs Appointment** 06/15/17   Lelon Perla, MD  benzonatate (TESSALON) 100 MG capsule Take 2 capsules (200 mg total) by mouth 3 (three) times daily as needed for cough. Patient not taking: Reported on 06/05/2017 10/25/16   Lysbeth Penner, FNP  BRILINTA 90 MG TABS tablet TAKE 1 TABLET BY MOUTH 2 TIMES DAILY 03/02/17   Lelon Perla, MD  cyclobenzaprine (FLEXERIL) 10 MG tablet Take 1 tablet (10 mg total) by mouth 2 (two) times daily as needed for muscle spasms. 06/05/17   Doristine Devoid, PA-C  Lidocaine-Menthol [facility] PAIN RELIEF) 3.99-1.25 % PTCH Apply 1 patch topically daily as  needed. 06/05/17   Doristine Devoid, PA-C  losartan (COZAAR) 25 MG tablet Take 1 tablet (25 mg total) by mouth daily. 07/22/16 02/17/17  Lelon Perla, MD  losartan (COZAAR) 25 MG tablet Take 25 mg by mouth daily. 05/10/17   [provider]  Multiple Vitamins-Minerals (MULTIVITAMIN ADULTS PO) Take 1 tablet by mouth daily.    [provider]  naproxen (NAPROSYN) 375 MG tablet Take 1 tablet (375 mg total) by mouth 2 (two) times daily. 06/05/17   Doristine Devoid, PA-C  nitroGLYCERIN (NITROSTAT) 0.4 MG SL tablet Place 1 tablet (0.4 mg total) under the tongue every 5 (five) minutes as needed for chest pain. 03/23/16   Burtis Junes, NP    Family History Family History  Problem Relation Age of Onset  . Heart attack Father   . Heart attack Sister     Social History Social History  Substance Use Topics  . Smoking status: Current Some Day Smoker    Packs/day: 1.00    Years: 35.00    Types: Cigarettes  . Smokeless tobacco: Never Used  . Alcohol use 0.0 oz/week     Comment: pint liquor (gin) daily     Allergies   Other   Review of Systems Review of Systems  Constitutional: Negative for chills, fatigue and fever.  Gastrointestinal: Negative for abdominal pain, diarrhea, nausea and vomiting.  Genitourinary: Negative for difficulty urinating and flank pain.  Musculoskeletal: Positive for arthralgias (right hip), back pain and myalgias. Negative for gait problem, neck pain and neck stiffness.  Skin: Negative for color change, rash and wound.  Neurological: Negative for weakness, numbness and headaches.  Psychiatric/Behavioral: Negative for agitation.     Physical Exam Updated Vital Signs BP 104/80   Pulse 85   Resp 16   SpO2 98%   Physical Exam  Constitutional: He is oriented to person, place, and time. He appears well-developed and well-nourished.  HENT:  Head: Normocephalic and atraumatic.  Eyes: Pupils are equal, round, and reactive to light. Right  eye exhibits no discharge. Left eye exhibits no discharge.  Neck: Normal range of motion. Neck supple.  No midline C-spine tenderness.  Abdominal: Soft. Bowel sounds are normal. There is no tenderness. There is no guarding.  No CVA tenderness.  Musculoskeletal: Normal range of motion.  No midline T-spine or L-spine tenderness. No paraspinal muscle tenderness. Mild tenderness noted over the lateral right hip. Full active ROM of right hip, knee, ankle. 5/5 strength in bilateral lower extremities. No right leg swelling, erythema, warmth, bruising. Positive right straight leg raise with symptoms of pain and tingling reproduced. DP pulses 2+ bilaterally.  Neurological: He is alert and oriented to person, place, and time. Coordination normal.  Distal sensation to sharp/light touch intact in bilateral lower extremities. Patellar reflex 2+ bilaterally. Gait normal.  Skin: Skin is warm and dry. Capillary refill takes less than 2 seconds.  Psychiatric: He  has a normal mood and affect. His behavior is normal.     ED Treatments / Results  Labs (all labs ordered are listed, but only abnormal results are displayed) Labs Reviewed - No data to display  EKG  EKG Interpretation None       Radiology No results found.  Procedures Procedures (including critical care time)  Medications Ordered in ED Medications - No data to display   Initial Impression / Assessment and Plan / ED Course  I have reviewed the triage vital signs and the nursing notes.  Pertinent labs & imaging results that were available during my care of the patient were reviewed by me and considered in my medical decision making (see chart for details).     Patient with right lower back pain and right sided sciatica. No neurological deficits and normal neuro exam. Patient can walk but states is painful. No loss of bowel or bladder control. No concern for cauda equina.  No fever, night sweats, weight loss, h/o cancer, IVDU. RICE  protocol and pain medicine indicated and discussed with patient. Will refer patient to orthopedics given that this problem has been ongoing for several years, and he has not had significant relief with conservative therapy. Have discussed return precautions the patient voices understanding and agrees to discharge.  Final Clinical Impressions(s) / ED Diagnoses   Final diagnoses:  Right hip pain  Sciatica of right side    New Prescriptions New Prescriptions   No medications on file     Bernarda Caffey 07/05/17 1015    Little, Wenda Overland, MD 07/05/17 1055

## 2017-07-04 NOTE — ED Triage Notes (Signed)
Pt reports continuous right hip pain, no new injury . On meds for pain yet no relief. Seen multiple times for same hip pain, no fall

## 2017-07-04 NOTE — ED Notes (Signed)
Bed: WTR6 Expected date:  Expected time:  Means of arrival:  Comments: EMS-fall-triage

## 2017-07-04 NOTE — Discharge Instructions (Signed)
Please schedule an appointment with Dr. Percell Miller who is the bone doctor. The contact information of his offices listed below. Please let him know that you were seen in the emergency department for right sided chronic hip pain and referred to his office for follow-up.  Your pain is consistent with sciatica. I have given you information to read about this below. It is not some that we can see on x-ray. Your x-rays in April were normal. I have written you a work note for today and tomorrow.  Please continue to take Naprosyn 500 mg twice a day for your hip pain, I also suggest taking Tylenol with this medicine as it can help improve your symptoms. Also applying heat to the lower back can help with your symptoms.  The medicine that your primary doctor prescribed to you is not pain medicine for your hip, it is blood pressure medication. Please take this as prescribed.  Please return to the emergency department if you develop fever with hip pain, sustained an injury to the hip, if you have trouble urinating, if you are no longer able to feel your feet or toes, or have weakness in which he cannot walk due to pain.

## 2017-08-23 ENCOUNTER — Other Ambulatory Visit: Payer: Self-pay | Admitting: Cardiology

## 2017-08-23 NOTE — Telephone Encounter (Signed)
NEED OV. 

## 2017-09-26 ENCOUNTER — Telehealth: Payer: Self-pay | Admitting: *Deleted

## 2017-09-26 NOTE — Telephone Encounter (Signed)
   [facility] Pre-operative Risk Assessment    Request for surgical clearance:  1. What type of surgery is being performed? RIGHT L4-L5 & L5-S1 EPIDURAL INJECTION   2. When is this surgery scheduled? PENDING   3. Are there any medications that need to be held prior to surgery and how long?BRILINTA-THEY ARE ASKING FOR DIRECTION   4. Practice name and name of physician performing surgery? DR Lake Bells [facility]   5. What is your office phone and fax number? [phone]  FAX=[phone]   6. Anesthesia type (None, local, MAC, general) ? NONE   Fredia Beets 09/26/2017, 5:08 PM  _________________________________________________________________   (provider comments below)

## 2017-09-28 NOTE — Telephone Encounter (Signed)
This pt hasn't been seen in over a year and will need an office visit.  Kerin Ransom PA-C 09/28/2017 2:51 PM

## 2017-09-28 NOTE — Telephone Encounter (Signed)
Spoke with patient and appointment made with Dr Purcell Nails. Patient voiced understanding.

## 2017-10-03 ENCOUNTER — Encounter: Payer: Self-pay | Admitting: Adult Health

## 2017-10-10 ENCOUNTER — Ambulatory Visit: Payer: Medicaid Other | Admitting: Adult Health

## 2017-10-10 ENCOUNTER — Other Ambulatory Visit: Payer: Self-pay | Admitting: Cardiology

## 2017-10-10 ENCOUNTER — Encounter: Payer: Self-pay | Admitting: Adult Health

## 2017-10-10 VITALS — BP 108/86 | HR 67 | Ht 65.0 in | Wt 163.2 lb

## 2017-10-10 DIAGNOSIS — I519 Heart disease, unspecified: Secondary | ICD-10-CM

## 2017-10-10 DIAGNOSIS — Z01810 Encounter for preprocedural cardiovascular examination: Secondary | ICD-10-CM

## 2017-10-10 DIAGNOSIS — E785 Hyperlipidemia, unspecified: Secondary | ICD-10-CM | POA: Diagnosis not present

## 2017-10-10 DIAGNOSIS — Z79899 Other long term (current) drug therapy: Secondary | ICD-10-CM | POA: Diagnosis not present

## 2017-10-10 MED ORDER — ATORVASTATIN CALCIUM 80 MG PO TABS: ORAL_TABLET | ORAL | 3 refills | Status: DC

## 2017-10-10 MED ORDER — NITROGLYCERIN 0.4 MG SL SUBL: 0.4000 mg | SUBLINGUAL_TABLET | SUBLINGUAL | 3 refills | Status: DC | PRN

## 2017-10-10 NOTE — Patient Instructions (Addendum)
Medication Instructions:  STOP Brilinta  Labwork: Please return for FASTING labs THE DAY OF YOUR ECHO (Lipid, Hepatic) at our [facility].   Testing/Procedures: Your physician has requested that you have an echocardiogram (AM if possible). Echocardiography is a painless test that uses sound waves to create images of your heart. It provides your doctor with information about the size and shape of your heart and how well your heart's chambers and valves are working. This procedure takes approximately one hour. There are no restrictions for this procedure.  This will be done at our [facility] location:  [place]: Your physician wants you to follow-up in: 6 months with Dr. Stanford Breed. You will receive a reminder letter in the mail two months in advance. If you don't receive a letter, please call our office to schedule the follow-up appointment.   If you need a refill on your cardiac medications before your next appointment, please call your pharmacy.  We will send a clearance notification to your surgeon's office.

## 2017-10-10 NOTE — Progress Notes (Signed)
Cardiology Office Note   Date:  10/10/2017   ID:  Lucas Stark, DOB Jun 19, 1956, MRN [id number]  PCP:  Rogers Blocker, MD  Cardiologist:  Kirk Ruths MD Chief Complaint  Patient presents with  . Coronary Artery Disease  . Pre-op Exam     History of Present Illness: Lucas Stark is a 62 y.o. male who presents for ongoing assessment and management of CAD, NSTEMI in 2017. Cardiac catheterization July 2017 showed mild LV systolic dysfunction, 82% second marginal. Patient had PCI of the second marginal with a drug-eluting stent. Echocardiogram November 2017 showed ejection fraction 50-55%.  He is here today for preoperative evaluation prior to having right L4-L5, and L5-S1 epidural injection per Weston Anna.  The patient has been without any recurrent complaints of chest discomfort, he does have some mild dyspnea but continues to smoke, he denies dizziness, he denies diaphoresis or weakness.  He works in a [employer] and is on his feet a lot and has not had any problems climbing stairs or completing his shift.  He is medically compliant.   Past Medical History:  Diagnosis Date  . CAD (coronary artery disease)    a. 03/2016 NSTEMI/PCI: LM nl, LAD nl, RI nl, LCX nl, OM2 99 ( 2.75 x 16 Synergy DES), RCA nl, EF 45-50%.  . Cocaine abuse ([place])   . ETOH abuse   . Hyperlipidemia   . Hypertensive heart disease   . Ischemic cardiomyopathy    a. 03/2016 Echo: EF 35-40%, diff H, sev inflat HK, Gr2 DD;  b. 03/2016 EF 45-50% by LV gram.  . LVF (left ventricular failure) ([place])   . Tobacco abuse     Past Surgical History:  Procedure Laterality Date  . CARDIAC CATHETERIZATION N/A 03/14/2016   Procedure: Left Heart Cath and Coronary Angiography;  Surgeon: Burnell Blanks, MD;  Location: [facility];  Service: Cardiovascular;  Laterality: N/A;  . No prior surgery       Current Outpatient Medications  Medication Sig Dispense Refill  . aspirin EC 81 MG EC tablet Take 1 tablet (81 mg  total) by mouth daily.    Marland Kitchen atorvastatin (LIPITOR) 80 MG tablet TAKE 1 TABLET BY MOUTH EVERY EVENING AT 6 pm 90 tablet 3  . D3 SUPER STRENGTH 2000 units CAPS Take 2,000 Units by mouth daily.  0  . gabapentin (NEURONTIN) 300 MG capsule Take 300 mg by mouth at bedtime.  1  . losartan (COZAAR) 25 MG tablet Take 1 tablet (25 mg total) by mouth daily. NEED OV. 30 tablet 0  . Multiple Vitamins-Minerals (MULTIVITAMIN ADULTS PO) Take 1 tablet by mouth daily.    . nitroGLYCERIN (NITROSTAT) 0.4 MG SL tablet Place 1 tablet (0.4 mg total) under the tongue every 5 (five) minutes as needed for chest pain. 25 tablet 3   No current facility-administered medications for this visit.     Allergies:   Other    Social History:  The patient  reports that he has been smoking cigarettes.  He has a 35.00 pack-year smoking history. he has never used smokeless tobacco. He reports that he drinks alcohol. He reports that he uses drugs.   Family History:  The patient's family history includes Heart attack in his father and sister.    ROS: All other systems are reviewed and negative. Unless otherwise mentioned in H&P    PHYSICAL EXAM: VS:  BP 108/86   Pulse 67   Ht 5\' 5"  (1.651 m)   Wt  163 lb 3.2 oz (74 kg)   BMI 27.16 kg/m  , BMI Body mass index is 27.16 kg/m. GEN: Well nourished, well developed, in no acute distress  HEENT: normal  Neck: no JVD, carotid bruits, or masses Cardiac: RRR; no murmurs, rubs, or gallops,no edema  Respiratory:  clear to auscultation bilaterally, normal work of breathing GI: soft, nontender, nondistended, + BS MS: no deformity or atrophy  Skin: warm and dry, no rash Neuro:  Strength and sensation are intact Psych: euthymic mood, full affect   EKG: Sinus rhythm with occasional PVC.  Heart rate of 67 bpm.  Recent Labs: No results found for requested labs within last 8760 hours.    Lipid Panel    Component Value Date/Time   CHOL 180 03/12/2016 1312   TRIG 38 03/12/2016  1312   HDL 59 03/12/2016 1312   CHOLHDL 3.1 03/12/2016 1312   VLDL 8 03/12/2016 1312   LDLCALC 113 (H) 03/12/2016 1312      Wt Readings from Last 3 Encounters:  10/10/17 163 lb 3.2 oz (74 kg)  06/05/17 165 lb (74.8 kg)  07/22/16 168 lb (76.2 kg)      Other studies Reviewed: Cardiac Cath 03/14/2016 Conclusion    2nd Mrg lesion, 99% stenosed. Post intervention, there is a 0% residual stenosis.  There is mild left ventricular systolic dysfunction.   1. Single vessel CAD with severe stenosis first obtuse marginal branch 2. Successful PTCA/DES x 1 first obtuse marginal branch 3. No obstructive disease in the RCA or LAD 4. Mild segmental LV systolic dysfunction  Recommendations: Will continue ASA and Brilinta for one year post PCI. Continue statin. No beta blocker with cocaine use. Likely discharge home tomorrow am.    Echocardiagram 07/18/2016 Left ventricle: The cavity size was normal. Systolic function was   at the lower limits of normal. The estimated ejection fraction   was in the range of 50% to 55%. Mild diffuse hypokinesis with   distinct regional wall motion abnormalities. Hypokinesis of the   inferolateral myocardium. Left ventricular diastolic function   parameters were normal. - Atrial septum: No defect or patent foramen ovale was identified.  Impressions:  - Compared to July 2017 study, left ventricular systolic and   diastolic function have improved. There is only subtle residual   disproportionate hypokinesis in the inferolateral wall.   ASSESSMENT AND PLAN:  1.  Coronary artery disease: Patient has DES to the first obtuse marginal branch.  He is asymptomatic for recurrent chest discomfort, fatigue, dizziness, or significant dyspnea.  The patient has been on Brilinta greater than 1 year.  It is our recommendation that he is able to stop Brilinta at this time.  This should not be an issue concerning his epidural injection if he is scheduled for this 48 hours  after stopping the medication.  From a cardiac standpoint he is cleared to proceed with injection, he may stop aspirin the day of the injection and restarted as soon as possible.  2.  Hypertension: Pressure is very well controlled.  He will continue losartan as directed.  I will repeat echocardiogram for ongoing surveillance of LV function.  If significant reduction he may need to have cardiac testing.  3.  Hypercholesterolemia: He states he is not had recent labs drawn to evaluate his current status.  We will plan fasting lipids and LFTs.  4.  Ongoing tobacco abuse: I have discussed smoking cessation with the patient.  He smokes a half a pack of cigarettes a day.  He does have some nicotine patches at home and he does want to quit.  I have advised him to try again to do so.  I have explained to him the health benefits of smoking cessation as well as prevention of further progression of CAD with known history.  Current medicines are reviewed at length with the patient today.    Labs/ tests ordered today include: Echocardiogram, fasting lipids and LFTs.  Phill Myron. West Pugh, ANP, AACC   10/10/2017 4:27 PM    [facility] [address], [place],  [address] Phone: [phone]; Fax: [phone]

## 2017-10-13 NOTE — Addendum Note (Signed)
Addended by: Diana Eves on: 10/13/2017 02:31 PM   Modules accepted: Orders

## 2017-10-18 ENCOUNTER — Other Ambulatory Visit: Payer: Self-pay

## 2017-10-18 ENCOUNTER — Other Ambulatory Visit: Payer: Self-pay | Admitting: *Deleted

## 2017-10-18 ENCOUNTER — Ambulatory Visit (HOSPITAL_COMMUNITY): Payer: Medicaid Other | Attending: Cardiology

## 2017-10-18 ENCOUNTER — Other Ambulatory Visit: Payer: Medicaid Other | Admitting: *Deleted

## 2017-10-18 DIAGNOSIS — E7800 Pure hypercholesterolemia, unspecified: Secondary | ICD-10-CM

## 2017-10-18 DIAGNOSIS — Z79899 Other long term (current) drug therapy: Secondary | ICD-10-CM

## 2017-10-18 DIAGNOSIS — I119 Hypertensive heart disease without heart failure: Secondary | ICD-10-CM | POA: Diagnosis not present

## 2017-10-18 DIAGNOSIS — I519 Heart disease, unspecified: Secondary | ICD-10-CM

## 2017-10-18 DIAGNOSIS — E785 Hyperlipidemia, unspecified: Secondary | ICD-10-CM

## 2017-10-18 LAB — LIPID PANEL
CHOL/HDL RATIO: 2.5 ratio (ref 0.0–5.0)
Cholesterol, Total: 157 mg/dL (ref 100–199)
HDL: 63 mg/dL (ref >39)
LDL CALC: 75 mg/dL (ref 0–99)
Triglycerides: 94 mg/dL (ref 0–149)
VLDL CHOLESTEROL CAL: 19 mg/dL (ref 5–40)

## 2017-10-18 LAB — HEPATIC FUNCTION PANEL
ALBUMIN: 4.3 g/dL (ref 3.6–4.8)
ALT: 20 IU/L (ref 0–44)
AST: 31 IU/L (ref 0–40)
Alkaline Phosphatase: 75 IU/L (ref 39–117)
Bilirubin Total: 0.7 mg/dL (ref 0.0–1.2)
Bilirubin, Direct: 0.17 mg/dL (ref 0.00–0.40)
TOTAL PROTEIN: 6.6 g/dL (ref 6.0–8.5)

## 2017-10-18 NOTE — Progress Notes (Signed)
Lab called stating they needed the order for hepatic panel, ordered differently, so they could pull up order. Reordered hepatic panel for lab.

## 2017-10-18 NOTE — Progress Notes (Signed)
Hepatic panel ordered incorrectly but unable to order it as the incorrect order has already been released and arrived. Lanny Hurst is going to manually add the hepatic panel.

## 2017-10-23 ENCOUNTER — Telehealth: Payer: Self-pay

## 2017-10-23 MED ORDER — HEPARIN SODIUM (PORCINE) 1000 UNIT/ML IJ SOLN
INTRAMUSCULAR | Status: AC
  Filled 2017-10-23: qty 1

## 2017-10-23 MED ORDER — IOPAMIDOL (ISOVUE-370) INJECTION 76%
INTRAVENOUS | Status: AC
  Filled 2017-10-23: qty 100

## 2017-10-23 MED ORDER — VERAPAMIL HCL 2.5 MG/ML IV SOLN
INTRAVENOUS | Status: AC
  Filled 2017-10-23: qty 2

## 2017-10-23 MED ORDER — MIDAZOLAM HCL 2 MG/2ML IJ SOLN
INTRAMUSCULAR | Status: AC
  Filled 2017-10-23: qty 2

## 2017-10-23 MED ORDER — LIDOCAINE HCL 1 % IJ SOLN
INTRAMUSCULAR | Status: AC
  Filled 2017-10-23: qty 20

## 2017-10-23 MED ORDER — FENTANYL CITRATE (PF) 100 MCG/2ML IJ SOLN
INTRAMUSCULAR | Status: AC
  Filled 2017-10-23: qty 2

## 2017-10-23 MED ORDER — HEPARIN (PORCINE) IN NACL 2-0.9 UNIT/ML-% IJ SOLN
INTRAMUSCULAR | Status: AC
  Filled 2017-10-23: qty 1000

## 2017-10-23 NOTE — Telephone Encounter (Signed)
Lendon Colonel, NP  Waylan Rocher, LPN I did not receive this in my in box. The EF is very low compared to normal EF on previous echo. Please make sure he has an appointment with his cardiologist to discuss further plans that may or may not include cardiac cath prior to his hip repair. He is not cleared until seen by his cardiologist  LM2CB-NOT [facility] [place].

## 2017-10-23 NOTE — Telephone Encounter (Signed)
SEE OTHER MESSAGE  [id number]-NOT CLEARED UNTIL SEEN AND REVIEWED ECHO WITH DR CRENSHAW.

## 2017-10-24 NOTE — Telephone Encounter (Signed)
Follow up     Patient called, states he is at work and can not talk any longer on the phone. Patient request nurse to call him back and leave a detailed message with details of echo. Offered to schedule appointment, patient request to speak to nurse first. Please call

## 2017-10-24 NOTE — Telephone Encounter (Signed)
PT NEEDS DR CRENSHAW APPT TO CLEAR FOR SURGERY-LEFT DETAILED MESSAGE AS REQUESTED.

## 2017-10-25 ENCOUNTER — Telehealth: Payer: Self-pay | Admitting: Adult Health

## 2017-10-26 ENCOUNTER — Encounter: Payer: Self-pay | Admitting: Physician Assistant

## 2017-10-26 ENCOUNTER — Ambulatory Visit: Payer: Medicaid Other | Admitting: Adult Health

## 2017-10-26 ENCOUNTER — Ambulatory Visit (INDEPENDENT_AMBULATORY_CARE_PROVIDER_SITE_OTHER): Payer: Medicaid Other | Admitting: Physician Assistant

## 2017-10-26 VITALS — BP 115/68 | HR 73 | Ht 65.0 in | Wt 165.2 lb

## 2017-10-26 DIAGNOSIS — E785 Hyperlipidemia, unspecified: Secondary | ICD-10-CM | POA: Diagnosis not present

## 2017-10-26 DIAGNOSIS — I519 Heart disease, unspecified: Secondary | ICD-10-CM | POA: Diagnosis not present

## 2017-10-26 DIAGNOSIS — R931 Abnormal findings on diagnostic imaging of heart and coronary circulation: Secondary | ICD-10-CM | POA: Diagnosis not present

## 2017-10-26 DIAGNOSIS — Z01810 Encounter for preprocedural cardiovascular examination: Secondary | ICD-10-CM

## 2017-10-26 DIAGNOSIS — I2510 Atherosclerotic heart disease of native coronary artery without angina pectoris: Secondary | ICD-10-CM | POA: Diagnosis not present

## 2017-10-26 DIAGNOSIS — I10 Essential (primary) hypertension: Secondary | ICD-10-CM

## 2017-10-26 DIAGNOSIS — F1910 Other psychoactive substance abuse, uncomplicated: Secondary | ICD-10-CM

## 2017-10-26 MED ORDER — CARVEDILOL 3.125 MG PO TABS: 3.1250 mg | ORAL_TABLET | Freq: Two times a day (BID) | ORAL | 3 refills | Status: DC

## 2017-10-26 NOTE — Telephone Encounter (Signed)
Pt cleared for injection at [facility] appt 10-26-17

## 2017-10-26 NOTE — Progress Notes (Signed)
Cardiology Office Note    Date:  10/28/2017   ID:  Lucas Stark, DOB 1955/12/14, MRN [id number]  PCP:  Rogers Blocker, MD  Cardiologist:  Dr. Stanford Breed   Chief Complaint  Patient presents with  . Follow-up    seen for Dr. Stanford Breed, recent abnormal echo  . Pre-op Exam    pending epidural injection by Dr Laroy Apple    History of Present Illness:  Lucas Stark is a 62 y.o. male with PMH of CAD, h/o polysubstance abuse (tobacco, EtOH and Cocaine), HTN, HLD, ICM with improved EF.  Patient had a NSTEMI and underwent cardiac catheterization in July 2017 which showed 99% OM 2 treated with 2.75 x 16 mm Synergy DES.  Otherwise he did not have other coronary artery disease.  Ejection fraction was 35-40% by echocardiogram in July 2017.  Based on hospital note on 03/15/2016, he was not placed on beta-blocker in the setting of cocaine use and baseline bradycardia.  Echocardiogram obtained in November 2017 showed ejection fraction has improved to 50-55%.  Patient was recently seen by Beckie Busing NP on 10/10/2017 for preoperative evaluation prior to epidural injection by Dr. Weston Anna.  He denied any recurrent chest pain however he did have mild dyspnea and continues to smoke.  Since his stent was more than a year ago, his Brilinta can be held for the procedure.  His echocardiogram was repeated on 10/18/2017 which showed EF 35-40%, diffuse hypokinesis worse in the inferolateral wall.  Patient presents today to review the echo result.  Patient described his previous anginal symptom as a focal substernal burning sensation, he has not had prolonged episodes since his hospitalization in July 2017.  He does notice occasional burning sensation however nothing more than 1 minute.  The last episode was near the end of December.  Given lack of symptom and the recent drop in the ejection fraction, we recommended addition of low-dose nonselective beta-blocker and Myoview.  He does not have any any recent  significant bradycardia, he denies any recent cocaine use.  Otherwise he is cleared to proceed with subdural injection, he does not need Myoview prior to the subdural injection given the low risk nature of the procedure.  He says he has already come off the Brilinta since the last office visit.  This is reasonable as his PCI was more than a year ago.   Past Medical History:  Diagnosis Date  . CAD (coronary artery disease)    a. 03/2016 NSTEMI/PCI: LM nl, LAD nl, RI nl, LCX nl, OM2 99 ( 2.75 x 16 Synergy DES), RCA nl, EF 45-50%.  . Cocaine abuse ([place])   . ETOH abuse   . Hyperlipidemia   . Hypertensive heart disease   . Ischemic cardiomyopathy    a. 03/2016 Echo: EF 35-40%, diff H, sev inflat HK, Gr2 DD;  b. 03/2016 EF 45-50% by LV gram.  . LVF (left ventricular failure) ([place])   . Tobacco abuse     Past Surgical History:  Procedure Laterality Date  . CARDIAC CATHETERIZATION N/A 03/14/2016   Procedure: Left Heart Cath and Coronary Angiography;  Surgeon: Burnell Blanks, MD;  Location: [facility];  Service: Cardiovascular;  Laterality: N/A;  . No prior surgery      Current Medications: Outpatient Medications Prior to Visit  Medication Sig Dispense Refill  . aspirin EC 81 MG EC tablet Take 1 tablet (81 mg total) by mouth daily.    Marland Kitchen atorvastatin (LIPITOR) 80 MG tablet  TAKE 1 TABLET BY MOUTH EVERY EVENING AT 6 pm 90 tablet 3  . D3 SUPER STRENGTH 2000 units CAPS Take 2,000 Units by mouth daily.  0  . gabapentin (NEURONTIN) 300 MG capsule Take 300 mg by mouth at bedtime.  1  . losartan (COZAAR) 25 MG tablet Take 1 tablet (25 mg total) by mouth daily. NEED OV. 30 tablet 0  . Multiple Vitamins-Minerals (MULTIVITAMIN ADULTS PO) Take 1 tablet by mouth daily.    . nitroGLYCERIN (NITROSTAT) 0.4 MG SL tablet Place 1 tablet (0.4 mg total) under the tongue every 5 (five) minutes as needed for chest pain. 25 tablet 3   No facility-administered medications prior to visit.      Allergies:    Other   Social History   Socioeconomic History  . Marital status: Legally Separated    Spouse name: None  . Number of children: 8  . Years of education: None  . Highest education level: None  Social Needs  . Financial resource strain: None  . Food insecurity - worry: None  . Food insecurity - inability: None  . Transportation needs - medical: None  . Transportation needs - non-medical: None  Occupational History  . None  Tobacco Use  . Smoking status: Current Some Day Smoker    Packs/day: 1.00    Years: 35.00    Pack years: 35.00    Types: Cigarettes  . Smokeless tobacco: Never Used  Substance and Sexual Activity  . Alcohol use: Yes    Alcohol/week: 0.0 oz    Comment: pint liquor (gin) daily  . Drug use: Yes    Comment: cocaine  . Sexual activity: None  Other Topics Concern  . None  Social History Narrative  . None     Family History:  The patient's family history includes Heart attack in his father and sister.   ROS:   Please see the history of present illness.    ROS All other systems reviewed and are negative.   PHYSICAL EXAM:   VS:  BP 115/68   Pulse 73   Ht 5\' 5"  (1.651 m)   Wt 165 lb 3.2 oz (74.9 kg)   BMI 27.49 kg/m    GEN: Well nourished, well developed, in no acute distress  HEENT: normal  Neck: no JVD, carotid bruits, or masses Cardiac: RRR; no murmurs, rubs, or gallops,no edema  Respiratory:  clear to auscultation bilaterally, normal work of breathing GI: soft, nontender, nondistended, + BS MS: no deformity or atrophy  Skin: warm and dry, no rash Neuro:  Alert and Oriented x 3, Strength and sensation are intact Psych: euthymic mood, full affect  Wt Readings from Last 3 Encounters:  10/26/17 165 lb 3.2 oz (74.9 kg)  10/10/17 163 lb 3.2 oz (74 kg)  06/05/17 165 lb (74.8 kg)      Studies/Labs Reviewed:   EKG:  EKG is not ordered today.   Recent Labs: 10/18/2017: ALT 20   Lipid Panel    Component Value Date/Time   CHOL 157  10/18/2017 0912   TRIG 94 10/18/2017 0912   HDL 63 10/18/2017 0912   CHOLHDL 2.5 10/18/2017 0912   CHOLHDL 3.1 03/12/2016 1312   VLDL 8 03/12/2016 1312   LDLCALC 75 10/18/2017 0912    Additional studies/ records that were reviewed today include:   Echo 10/18/2017 LV EF: 35% -   40%  Study Conclusions  - Left ventricle: The cavity size was normal. Wall thickness was   normal. Indeterminant  diastolic function. Systolic function was   moderately reduced. The estimated ejection fraction was in the   range of 35% to 40%. Diffuse hypokinesis, worse in the   inferolateral wall. - Aortic valve: There was no stenosis. - Mitral valve: There was trivial regurgitation. - Right ventricle: The cavity size was normal. Systolic function   was normal. - Tricuspid valve: Peak RV-RA gradient (S): 20 mm Hg. - Pulmonary arteries: PA peak pressure: 23 mm Hg (S). - Inferior vena cava: The vessel was normal in size. The   respirophasic diameter changes were in the normal range (= 50%),   consistent with normal central venous pressure.  Impressions:  - Normal LV size with EF 35-40%. Moderate diffuse hypokinesis,   worse in the inferolateral wall. Normal RV size and systolic   function. No significant valvular abnormalities.    ASSESSMENT:    1. Preop cardiovascular exam   2. Abnormal echocardiogram   3. LV dysfunction   4. Coronary artery disease involving native coronary artery of native heart without angina pectoris   5. Polysubstance abuse ([place])   6. Essential hypertension   7. Hyperlipidemia, unspecified hyperlipidemia type      PLAN:  In order of problems listed above:  1. Preoperative clearance: He is able to climb up at least 2 flight of stairs without any exertional chest pain or shortness of breath.  Upcoming subdural injection is low risk.  He is already off Brilinta since the last office visit.  I have discussed the case with DOD Dr. Ellyn Hack, he is cleared to proceed with  procedure without further workup.  I did order a Lexiscan Myoview for his LV dysfunction and the recent drop in ejection fraction, however this does not need to be completed prior to the subdural injection.  2. Abnormal echocardiogram: Echocardiogram was ordered during the last office visit, it showed his ejection fraction which has improved on the last echocardiogram dropped again to 35%.  Patient however denies any recent chest pain, the last time he had an episode of chest pain was in December however it was transient and did not last more than a minute.  He denies any prolonged episode of chest pain since the previous cardiac catheterization.  I recommended a Myoview which can be done after the subdural injection.  He is not in acute heart failure based on physical exam.  He has not done cocaine recently, I will add a very low-dose 3.125 mg twice daily of carvedilol to his medical regimen.  3. Hypertension: on losartan 25 mg daily.  Add carvedilol 3.125 mg twice daily for LV dysfunction.  Will avoid selective beta-blocker given prior history of cocaine use.  4. Hyperlipidemia: Continue 80 mg daily of Lipitor.  5. Polysubstance abuse: Again he denies using cocaine recently.    Medication Adjustments/Labs and Tests Ordered: Current medicines are reviewed at length with the patient today.  Concerns regarding medicines are outlined above.  Medication changes, Labs and Tests ordered today are listed in the Patient Instructions below. Patient Instructions  You have been cleared for your back injection!  Medication Instructions: Isaac Laud has recommended making the following medication changes: 1. START Carvedilol 3.125 mg - take 1 tablet by mouth twice daily  Labwork: NONE ORDERED  Testing/Procedures: 1. Lexiscan Myoview stress test at your convenience - Your physician has requested that you have a lexiscan myoview. For further information please visit [email or web address]. Please follow instruction  sheet, as given.  Follow-up: Isaac Laud recommends that you schedule a follow-up appointment  in 2-3 months with Dr Stanford Breed.  If you need a refill on your cardiac medications before your next appointment, please call your pharmacy.    Hilbert Corrigan, [place]  10/28/2017 9:57 AM    [address], [place], [place]  [address] Phone: [phone]; Fax: [phone]

## 2017-10-26 NOTE — Telephone Encounter (Signed)
Pre-op clearance appt scheduled 2-14 [facility]

## 2017-10-26 NOTE — Patient Instructions (Signed)
You have been cleared for your back injection!  Medication Instructions: Isaac Laud has recommended making the following medication changes: 1. START Carvedilol 3.125 mg - take 1 tablet by mouth twice daily  Labwork: NONE ORDERED  Testing/Procedures: 1. Lexiscan Myoview stress test at your convenience - Your physician has requested that you have a lexiscan myoview. For further information please visit [email or web address]. Please follow instruction sheet, as given.  Follow-up: Isaac Laud recommends that you schedule a follow-up appointment in 2-3 months with Dr Stanford Breed.  If you need a refill on your cardiac medications before your next appointment, please call your pharmacy.

## 2017-10-28 ENCOUNTER — Encounter: Payer: Self-pay | Admitting: Physician Assistant

## 2017-11-01 NOTE — Telephone Encounter (Signed)
Encounter complete. 

## 2017-11-07 ENCOUNTER — Ambulatory Visit (HOSPITAL_COMMUNITY)
Admission: RE | Admit: 2017-11-07 | Discharge: 2017-11-07 | Disposition: A | Discharge status: Home / Self Care | Payer: Medicaid Other | Source: Ambulatory Visit | Attending: Cardiology | Admitting: Cardiology

## 2017-11-07 DIAGNOSIS — I252 Old myocardial infarction: Secondary | ICD-10-CM | POA: Insufficient documentation

## 2017-11-07 DIAGNOSIS — Z8249 Family history of ischemic heart disease and other diseases of the circulatory system: Secondary | ICD-10-CM | POA: Insufficient documentation

## 2017-11-07 DIAGNOSIS — R9439 Abnormal result of other cardiovascular function study: Secondary | ICD-10-CM | POA: Insufficient documentation

## 2017-11-07 DIAGNOSIS — I119 Hypertensive heart disease without heart failure: Secondary | ICD-10-CM | POA: Insufficient documentation

## 2017-11-07 DIAGNOSIS — R931 Abnormal findings on diagnostic imaging of heart and coronary circulation: Secondary | ICD-10-CM | POA: Diagnosis not present

## 2017-11-07 DIAGNOSIS — R0602 Shortness of breath: Secondary | ICD-10-CM | POA: Diagnosis not present

## 2017-11-07 DIAGNOSIS — I255 Ischemic cardiomyopathy: Secondary | ICD-10-CM | POA: Diagnosis not present

## 2017-11-07 DIAGNOSIS — R079 Chest pain, unspecified: Secondary | ICD-10-CM | POA: Diagnosis not present

## 2017-11-07 DIAGNOSIS — I519 Heart disease, unspecified: Secondary | ICD-10-CM

## 2017-11-07 DIAGNOSIS — I2510 Atherosclerotic heart disease of native coronary artery without angina pectoris: Secondary | ICD-10-CM | POA: Diagnosis not present

## 2017-11-07 DIAGNOSIS — F17200 Nicotine dependence, unspecified, uncomplicated: Secondary | ICD-10-CM | POA: Insufficient documentation

## 2017-11-07 LAB — MYOCARDIAL PERFUSION IMAGING
CHL CUP NUCLEAR SDS: 2
CHL CUP NUCLEAR SRS: 7
CHL CUP RESTING HR STRESS: 54 {beats}/min
LV dias vol: 164 mL (ref 62–150)
LV sys vol: 108 mL
NUC STRESS TID: 1.08
Peak HR: 109 {beats}/min
SSS: 9

## 2017-11-07 MED ORDER — TECHNETIUM TC 99M TETROFOSMIN IV KIT
9.9000 | PACK | Freq: Once | INTRAVENOUS | Status: AC | PRN
  Administered 2017-11-07: 9.9 via INTRAVENOUS
  Filled 2017-11-07: qty 10

## 2017-11-07 MED ORDER — TECHNETIUM TC 99M TETROFOSMIN IV KIT
31.8000 | PACK | Freq: Once | INTRAVENOUS | Status: AC | PRN
  Administered 2017-11-07: 31.8 via INTRAVENOUS
  Filled 2017-11-07: qty 32

## 2017-11-07 MED ORDER — REGADENOSON 0.4 MG/5ML IV SOLN
0.4000 mg | Freq: Once | INTRAVENOUS | Status: AC
  Administered 2017-11-07: 0.4 mg via INTRAVENOUS

## 2017-11-08 NOTE — Progress Notes (Signed)
HPI: Follow-up coronary artery disease. Note h/o substance abuse. Patient previously admitted with non-ST elevation myocardial infarction. Echocardiogram July 2017 showed ejection fraction 35-40% and grade 2 diastolic dysfunction. Cardiac catheterization July 2017 showed mild LV systolic dysfunction, 40% second marginal. Patient had PCI of the second marginal with a drug-eluting stent. Patient had cough with ACEI.  Echocardiogram February 2019 showed ejection fraction 35-40%.  Nuclear study February 2019 showed ejection fraction 34%, prior inferior infarct but no ischemia.  Since last seen, the patient has dyspnea with more extreme activities but not with routine activities. It is relieved with rest. It is not associated with chest pain. There is no orthopnea, PND or pedal edema. There is no syncope or palpitations. There is no exertional chest pain.   Current Outpatient Medications  Medication Sig Dispense Refill  . aspirin EC 81 MG EC tablet Take 1 tablet (81 mg total) by mouth daily.    Marland Kitchen atorvastatin (LIPITOR) 80 MG tablet TAKE 1 TABLET BY MOUTH EVERY EVENING AT 6 pm 90 tablet 3  . carvedilol (COREG) 3.125 MG tablet Take 1 tablet (3.125 mg total) by mouth 2 (two) times daily. 180 tablet 3  . D3 SUPER STRENGTH 2000 units CAPS Take 2,000 Units by mouth daily.  0  . gabapentin (NEURONTIN) 300 MG capsule Take 300 mg by mouth at bedtime.  1  . losartan (COZAAR) 25 MG tablet TAKE 1 TABLET BY MOUTH EVERY DAY. Needs office visit 90 tablet 3  . Multiple Vitamins-Minerals (MULTIVITAMIN ADULTS PO) Take 1 tablet by mouth daily.    . nitroGLYCERIN (NITROSTAT) 0.4 MG SL tablet Place 1 tablet (0.4 mg total) under the tongue every 5 (five) minutes as needed for chest pain. 25 tablet 3   No current facility-administered medications for this visit.      Past Medical History:  Diagnosis Date  . CAD (coronary artery disease)    a. 03/2016 NSTEMI/PCI: LM nl, LAD nl, RI nl, LCX nl, OM2 99 ( 2.75 x 16  Synergy DES), RCA nl, EF 45-50%.  . Cocaine abuse ([place])   . ETOH abuse   . Hyperlipidemia   . Hypertensive heart disease   . Ischemic cardiomyopathy    a. 03/2016 Echo: EF 35-40%, diff H, sev inflat HK, Gr2 DD;  b. 03/2016 EF 45-50% by LV gram.  . LVF (left ventricular failure) ([place])   . Tobacco abuse     Past Surgical History:  Procedure Laterality Date  . CARDIAC CATHETERIZATION N/A 03/14/2016   Procedure: Left Heart Cath and Coronary Angiography;  Surgeon: Burnell Blanks, MD;  Location: [facility];  Service: Cardiovascular;  Laterality: N/A;  . No prior surgery      Social History   Socioeconomic History  . Marital status: Legally Separated    Spouse name: Not on file  . Number of children: 8  . Years of education: Not on file  . Highest education level: Not on file  Social Needs  . Financial resource strain: Not on file  . Food insecurity - worry: Not on file  . Food insecurity - inability: Not on file  . Transportation needs - medical: Not on file  . Transportation needs - non-medical: Not on file  Occupational History  . Not on file  Tobacco Use  . Smoking status: Current Some Day Smoker    Packs/day: 1.00    Years: 35.00    Pack years: 35.00    Types: Cigarettes  . Smokeless tobacco:  Never Used  Substance and Sexual Activity  . Alcohol use: Yes    Alcohol/week: 0.0 oz    Comment: pint liquor (gin) daily  . Drug use: Yes    Comment: cocaine  . Sexual activity: Not on file  Other Topics Concern  . Not on file  Social History Narrative  . Not on file    Family History  Problem Relation Age of Onset  . Heart attack Father   . Heart attack Sister     ROS: no fevers or chills, productive cough, hemoptysis, dysphasia, odynophagia, melena, hematochezia, dysuria, hematuria, rash, seizure activity, orthopnea, PND, pedal edema, claudication. Remaining systems are negative.  Physical Exam: Well-developed well-nourished in no acute distress.  Skin  is warm and dry.  HEENT is normal.  Neck is supple.  Chest is clear to auscultation with normal expansion.  Cardiovascular exam is regular rate and rhythm.  Abdominal exam nontender or distended. No masses palpated. Extremities show no edema. neuro grossly intact  A/P  1 coronary artery disease-plan to continue medical therapy.  Continue aspirin and statin.  2 ischemic cardiomyopathy-continue beta-blocker (increase coreg to 6.25 BID) and ARB.  3 hyperlipidemia-continue statin.  4 tobacco abuse-patient counseled on discontinuing.  We have provided a nicotine patch today.  5 history of substance abuse-patient denies at present.  We discussed the importance of avoiding.  Kirk Ruths, MD

## 2017-11-09 ENCOUNTER — Other Ambulatory Visit: Payer: Self-pay | Admitting: Cardiology

## 2017-11-10 ENCOUNTER — Other Ambulatory Visit: Payer: Self-pay

## 2017-11-10 DIAGNOSIS — I519 Heart disease, unspecified: Secondary | ICD-10-CM

## 2017-11-14 ENCOUNTER — Telehealth: Payer: Self-pay | Admitting: Physician Assistant

## 2017-11-14 NOTE — Telephone Encounter (Signed)
Called patient and LVM to call back to schedule his echo for June 2019.

## 2017-11-16 ENCOUNTER — Ambulatory Visit: Payer: Medicaid Other | Admitting: Cardiology

## 2017-11-16 ENCOUNTER — Encounter: Payer: Self-pay | Admitting: Cardiology

## 2017-11-16 VITALS — BP 118/62 | HR 72 | Wt 162.0 lb

## 2017-11-16 DIAGNOSIS — I2510 Atherosclerotic heart disease of native coronary artery without angina pectoris: Secondary | ICD-10-CM | POA: Diagnosis not present

## 2017-11-16 DIAGNOSIS — I255 Ischemic cardiomyopathy: Secondary | ICD-10-CM

## 2017-11-16 DIAGNOSIS — E7800 Pure hypercholesterolemia, unspecified: Secondary | ICD-10-CM

## 2017-11-16 MED ORDER — NICOTINE 21-14-7 MG/24HR TD KIT: 1.0000 | PACK | TRANSDERMAL | 0 refills | Status: DC

## 2017-11-16 MED ORDER — CARVEDILOL 6.25 MG PO TABS: 6.2500 mg | ORAL_TABLET | Freq: Two times a day (BID) | ORAL | 3 refills | Status: DC

## 2017-11-16 NOTE — Patient Instructions (Signed)
Medication Instructions:   INCREASE CARVEDILOL TO 6.25 MG ONE TABLET TWICE DAILY= 2 OF THE 3.125 MG TABLETS TWICE DAILY  NICOTINE PATCH KIT AS DIRECTED  Follow-Up:  Your physician wants you to follow-up in: [place] will receive a reminder letter in the mail two months in advance. If you don't receive a letter, please call our office to schedule the follow-up appointment.   If you need a refill on your cardiac medications before your next appointment, please call your pharmacy.

## 2017-11-17 ENCOUNTER — Telehealth: Payer: Self-pay | Admitting: Cardiology

## 2017-11-17 NOTE — Telephone Encounter (Signed)
error 

## 2017-11-22 ENCOUNTER — Telehealth: Payer: Self-pay

## 2017-11-22 NOTE — Telephone Encounter (Signed)
Unable to reach patient to schedule ECHO; Lucas Stark has contacted the patient 3 times with no success; will inform Hao.

## 2017-11-22 NOTE — Telephone Encounter (Signed)
-----   Message from Corinna Lines sent at 11/20/2017  2:16 PM EDT ----- Regarding: echo   Tee    I have called this patient 3 times and he has not called back to schedule his echo yet.  [facility]

## 2017-12-19 ENCOUNTER — Other Ambulatory Visit (HOSPITAL_COMMUNITY): Payer: Medicaid Other

## 2018-01-24 ENCOUNTER — Ambulatory Visit (HOSPITAL_COMMUNITY)
Admission: RE | Admit: 2018-01-24 | Discharge: 2018-01-24 | Disposition: A | Discharge status: Home / Self Care | Payer: Medicaid Other | Source: Ambulatory Visit | Attending: Internal Medicine | Admitting: Internal Medicine

## 2018-01-24 ENCOUNTER — Other Ambulatory Visit (HOSPITAL_COMMUNITY): Payer: Self-pay | Admitting: Internal Medicine

## 2018-01-24 DIAGNOSIS — M25511 Pain in right shoulder: Secondary | ICD-10-CM

## 2018-01-24 DIAGNOSIS — M24811 Other specific joint derangements of right shoulder, not elsewhere classified: Secondary | ICD-10-CM | POA: Diagnosis not present

## 2018-02-20 ENCOUNTER — Other Ambulatory Visit (HOSPITAL_COMMUNITY): Payer: Medicaid Other

## 2018-02-26 ENCOUNTER — Encounter: Payer: Self-pay | Admitting: Physician Assistant

## 2018-04-02 ENCOUNTER — Other Ambulatory Visit: Payer: Self-pay | Admitting: Adult Health

## 2018-04-02 NOTE — Telephone Encounter (Signed)
Rx sent to pharmacy   

## 2018-08-15 ENCOUNTER — Encounter: Payer: Self-pay | Admitting: Cardiology

## 2018-08-15 ENCOUNTER — Ambulatory Visit (INDEPENDENT_AMBULATORY_CARE_PROVIDER_SITE_OTHER): Payer: Medicaid Other | Admitting: Cardiology

## 2018-08-15 DIAGNOSIS — M25462 Effusion, left knee: Secondary | ICD-10-CM | POA: Insufficient documentation

## 2018-08-15 DIAGNOSIS — I2510 Atherosclerotic heart disease of native coronary artery without angina pectoris: Secondary | ICD-10-CM | POA: Diagnosis not present

## 2018-08-15 DIAGNOSIS — Z9861 Coronary angioplasty status: Secondary | ICD-10-CM | POA: Diagnosis not present

## 2018-08-15 DIAGNOSIS — I10 Essential (primary) hypertension: Secondary | ICD-10-CM | POA: Diagnosis not present

## 2018-08-15 DIAGNOSIS — I252 Old myocardial infarction: Secondary | ICD-10-CM | POA: Diagnosis not present

## 2018-08-15 DIAGNOSIS — I255 Ischemic cardiomyopathy: Secondary | ICD-10-CM

## 2018-08-15 DIAGNOSIS — E785 Hyperlipidemia, unspecified: Secondary | ICD-10-CM

## 2018-08-15 DIAGNOSIS — Z720 Tobacco use: Secondary | ICD-10-CM

## 2018-08-15 HISTORY — DX: Effusion, left knee: M25.462

## 2018-08-15 NOTE — Assessment & Plan Note (Signed)
Goal B/P 120/80-

## 2018-08-15 NOTE — Progress Notes (Signed)
08/15/2018 TAREZ BOWNS   [birth year]  [id number]  Primary Physician Rogers Blocker, MD Primary Cardiologist: Dr Stanford Breed  HPI: Mr. Cropp is a 62 year old male followed by Dr. Stanford Breed with a history of non-ST elevation MI in July 2017.  He presented with chest pain ruled in for an MI.  There is a history of recent cocaine use.  He went to catheterization and had an OM1 lesion that was treated with a DES.  He did have an ischemic cardiomyopathy after this, his last echo in February 2019 showed his EF to be 35 to 40%.  He is here in the office today for routine follow-up.  He is done well since we saw him last.  He denies unusual chest pain or shortness of breath orthopnea.  He did complain of left knee pain and swelling which seemed to come on spontaneously over the last 24 hours.   Current Outpatient Medications  Medication Sig Dispense Refill  . aspirin EC 81 MG EC tablet Take 1 tablet (81 mg total) by mouth daily.    Marland Kitchen atorvastatin (LIPITOR) 80 MG tablet TAKE 1 TABLET BY MOUTH EVERY EVENING AT 6 pm 90 tablet 2  . D3 SUPER STRENGTH 2000 units CAPS Take 2,000 Units by mouth daily.  0  . losartan (COZAAR) 25 MG tablet TAKE 1 TABLET BY MOUTH EVERY DAY. Needs office visit 90 tablet 3  . carvedilol (COREG) 6.25 MG tablet Take 1 tablet (6.25 mg total) by mouth 2 (two) times daily. 180 tablet 3  . gabapentin (NEURONTIN) 300 MG capsule Take 300 mg by mouth at bedtime.  1  . nitroGLYCERIN (NITROSTAT) 0.4 MG SL tablet Place 1 tablet (0.4 mg total) under the tongue every 5 (five) minutes as needed for chest pain. (Patient not taking: Reported on 08/15/2018) 25 tablet 3   No current facility-administered medications for this visit.     Allergies  Allergen Reactions  . Other Itching    Sleeping pills    Past Medical History:  Diagnosis Date  . CAD (coronary artery disease)    a. 03/2016 NSTEMI/PCI: LM nl, LAD nl, RI nl, LCX nl, OM2 99 ( 2.75 x 16 Synergy DES), RCA nl, EF 45-50%.  . Cocaine  abuse ([place])   . ETOH abuse   . Hyperlipidemia   . Hypertensive heart disease   . Ischemic cardiomyopathy    a. 03/2016 Echo: EF 35-40%, diff H, sev inflat HK, Gr2 DD;  b. 03/2016 EF 45-50% by LV gram.  . LVF (left ventricular failure) ([place])   . Tobacco abuse     Social History   Socioeconomic History  . Marital status: Legally Separated    Spouse name: Not on file  . Number of children: 8  . Years of education: Not on file  . Highest education level: Not on file  Occupational History  . Not on file  Social Needs  . Financial resource strain: Not on file  . Food insecurity:    Worry: Not on file    Inability: Not on file  . Transportation needs:    Medical: Not on file    Non-medical: Not on file  Tobacco Use  . Smoking status: Current Some Day Smoker    Packs/day: 1.00    Years: 35.00    Pack years: 35.00    Types: Cigarettes  . Smokeless tobacco: Never Used  Substance and Sexual Activity  . Alcohol use: Yes    Alcohol/week: 0.0 standard drinks  Comment: pint liquor (gin) daily  . Drug use: Yes    Comment: cocaine  . Sexual activity: Not on file  Lifestyle  . Physical activity:    Days per week: Not on file    Minutes per session: Not on file  . Stress: Not on file  Relationships  . Social connections:    Talks on phone: Not on file    Gets together: Not on file    Attends religious service: Not on file    Active member of club or organization: Not on file    Attends meetings of clubs or organizations: Not on file    Relationship status: Not on file  . Intimate partner violence:    Fear of current or ex partner: Not on file    Emotionally abused: Not on file    Physically abused: Not on file    Forced sexual activity: Not on file  Other Topics Concern  . Not on file  Social History Narrative  . Not on file     Family History  Problem Relation Age of Onset  . Heart attack Father   . Heart attack Sister      Review of Systems: General: negative  for chills, fever, night sweats or weight changes.  Cardiovascular: negative for chest pain, dyspnea on exertion, edema, orthopnea, palpitations, paroxysmal nocturnal dyspnea or shortness of breath Dermatological: negative for rash Respiratory: negative for cough or wheezing Urologic: negative for hematuria Abdominal: negative for nausea, vomiting, diarrhea, bright red blood per rectum, melena, or hematemesis Neurologic: negative for visual changes, syncope, or dizziness  Lt knee pain and swelling  All other systems reviewed and are otherwise negative except as noted above.    Blood pressure 138/70, pulse 71, height '5\' 5"'$  (1.651 m), weight 175 lb (79.4 kg).  General appearance: alert, cooperative and no distress Neck: no carotid bruit, no JVD Lungs: clear to auscultation bilaterally Heart: regular rate and rhythm Extremities: Lt knee is swollen and warm to touch Neurologic: Grossly normal  EKG NSR, inferior Qs, TWI V4-V5  ASSESSMENT AND PLAN:   History of non-ST elevation myocardial infarction (NSTEMI) July 2017 in setting of Cocaine use and CAD  Cardiomyopathy, ischemic Last EF 35-40% by echo Feb 2019  Benign essential HTN Goal B/P 120/80-  Dyslipidemia, goal LDL below 70 On high dose statin Rx-LDL 75 Feb 2019  Tobacco abuse Only smoking occasionally now  Knee effusion, left Pt reports spontaneous pain and swelling last 24 hours of his Lt knee.    PLAN  He'll monitor his blood pressure at home.  I suggested he stop smoking altogether.  We will see him back in 3 months, he will get a c-Met and fasting lipid panel before that.  I suggested he go to an urgent care as soon as it is convenient form to have someone look at that knee.  In the meantime I suggested he wrap it and put ice on it.  Kerin Ransom PA-C 08/15/2018 3:49 PM

## 2018-08-15 NOTE — Assessment & Plan Note (Signed)
On high dose statin Rx-LDL 75 Feb 2019

## 2018-08-15 NOTE — Patient Instructions (Signed)
Medication Instructions:  Your physician recommends that you continue on your current medications as directed. Please refer to the Current Medication list given to you today.  If you need a refill on your cardiac medications before your next appointment, please call your pharmacy.   Lab work: Your physician recommends that you return for a FASTING lipid profile and cmet prior to your appointment with Dr.Crenshaw If you have labs (blood work) drawn today and your tests are completely normal, you will receive your results only by: Marland Kitchen MyChart Message (if you have MyChart) OR . A paper copy in the mail If you have any lab test that is abnormal or we need to change your treatment, we will call you to review the results.  Testing/Procedures: None ordered  Follow-Up: At [facility], you and your health needs are our priority.  As part of our continuing mission to provide you with exceptional heart care, we have created designated Provider Care Teams.  These Care Teams include your primary Cardiologist (physician) and Advanced Practice Providers (APPs -  Physician Assistants and Nurse Practitioners) who all work together to provide you with the care you need, when you need it. You will need a follow up appointment in 2 months with Dr.Creshaw.     Any Other Special Instructions Will Be Listed Below (If Applicable). Your physician has requested that you regularly monitor and record your blood pressure readings at home. Please use the same machine at the same time of day to check your readings and record them to bring to your follow-up visit.  Please call the office if your systolic (top number) is consistently above [phone] or your diastolic (bottom number) is above 90   .

## 2018-08-15 NOTE — Assessment & Plan Note (Addendum)
Pt reports spontaneous pain and swelling last 24 hours of his Lt knee.

## 2018-08-15 NOTE — Assessment & Plan Note (Signed)
July 2017 in setting of Cocaine use and CAD

## 2018-08-15 NOTE — Assessment & Plan Note (Signed)
Last EF 35-40% by echo Feb 2019

## 2018-08-15 NOTE — Assessment & Plan Note (Signed)
Only smoking occasionally now

## 2018-08-16 ENCOUNTER — Emergency Department (HOSPITAL_COMMUNITY)
Admission: EM | Admit: 2018-08-16 | Discharge: 2018-08-16 | Disposition: A | Discharge status: Home / Self Care | Payer: Medicaid Other | Attending: Emergency Medicine | Admitting: Emergency Medicine

## 2018-08-16 ENCOUNTER — Encounter (HOSPITAL_COMMUNITY): Payer: Self-pay

## 2018-08-16 ENCOUNTER — Emergency Department (HOSPITAL_COMMUNITY): Payer: Medicaid Other

## 2018-08-16 DIAGNOSIS — F17210 Nicotine dependence, cigarettes, uncomplicated: Secondary | ICD-10-CM | POA: Insufficient documentation

## 2018-08-16 DIAGNOSIS — Z79899 Other long term (current) drug therapy: Secondary | ICD-10-CM | POA: Insufficient documentation

## 2018-08-16 DIAGNOSIS — I119 Hypertensive heart disease without heart failure: Secondary | ICD-10-CM | POA: Diagnosis not present

## 2018-08-16 DIAGNOSIS — Z7982 Long term (current) use of aspirin: Secondary | ICD-10-CM | POA: Diagnosis not present

## 2018-08-16 DIAGNOSIS — M25562 Pain in left knee: Secondary | ICD-10-CM | POA: Insufficient documentation

## 2018-08-16 MED ORDER — NAPROXEN 250 MG PO TABS
500.0000 mg | ORAL_TABLET | Freq: Once | ORAL | Status: AC
  Administered 2018-08-16: 500 mg via ORAL
  Filled 2018-08-16: qty 2

## 2018-08-16 MED ORDER — MELOXICAM 7.5 MG PO TABS: 15.0000 mg | ORAL_TABLET | Freq: Every day | ORAL | 0 refills | Status: DC

## 2018-08-16 NOTE — ED Triage Notes (Signed)
Pt reports that his L knee is swollen since Friday, denies injury

## 2018-08-16 NOTE — Discharge Instructions (Signed)
Take the prescribed medication as directed.  Can continue to ice and elevate knee at home to help with pain/swelling.  Continue wearing knee brace for compression. Follow-up with your primary care doctor. Return to the ED for new or worsening symptoms.

## 2018-08-16 NOTE — ED Notes (Signed)
E-signature not available, pt verbalized understanding of DC instructions and prescriptions 

## 2018-08-16 NOTE — ED Provider Notes (Signed)
[facility] EMERGENCY DEPARTMENT Provider Note   CSN: [id number] Arrival date & time: 08/16/18  0135     History   Chief Complaint Chief Complaint  Patient presents with  . Knee Pain    HPI Lucas Stark is a 62 y.o. male.   Knee Pain     62 year old male with history of CAD, cocaine abuse, alcohol abuse, hyperlipidemia, hypertension, cardiomyopathy, presenting to the ED with left knee pain.  Reports this began last Friday, 6 days ago.  States it is just been bothering him to the point now that he cannot stand it anymore.  States he is having trouble sleeping due to the pain.  Reports pain along the medial left knee, feels like something is stabbing him in the knee.  States he does feel little bit of a grinding sensation.  He has not had any popping or clicking or buckling of the knee when walking.  He denies any numbness or weakness of his left leg.  He has not tried any medications at home for his symptoms.  He has tried some topical arthritis cream without much relief.  Past Medical History:  Diagnosis Date  . CAD (coronary artery disease)    a. 03/2016 NSTEMI/PCI: LM nl, LAD nl, RI nl, LCX nl, OM2 99 ( 2.75 x 16 Synergy DES), RCA nl, EF 45-50%.  . Cocaine abuse ([place])   . ETOH abuse   . Hyperlipidemia   . Hypertensive heart disease   . Ischemic cardiomyopathy    a. 03/2016 Echo: EF 35-40%, diff H, sev inflat HK, Gr2 DD;  b. 03/2016 EF 45-50% by LV gram.  . LVF (left ventricular failure) ([place])   . Tobacco abuse     Patient Active Problem List   Diagnosis Date Noted  . Knee effusion, left 08/15/2018  . Hypertensive heart disease without CHF 03/15/2016  . Dyslipidemia, goal LDL below 70 03/15/2016  . CAD S/P percutaneous coronary angioplasty 03/15/2016  . Cardiomyopathy, ischemic 03/15/2016  . Status post coronary artery stent placement   . History of non-ST elevation myocardial infarction (NSTEMI) 03/12/2016  . Benign essential HTN 03/12/2016  .  Tobacco abuse 03/12/2016  . Cocaine use 03/12/2016  . Alcohol abuse 03/12/2016    Past Surgical History:  Procedure Laterality Date  . CARDIAC CATHETERIZATION N/A 03/14/2016   Procedure: Left Heart Cath and Coronary Angiography;  Surgeon: Burnell Blanks, MD;  Location: [facility];  Service: Cardiovascular;  Laterality: N/A;  . No prior surgery          Home Medications    Prior to Admission medications   Medication Sig Start Date End Date Taking? Authorizing Provider  aspirin EC 81 MG EC tablet Take 1 tablet (81 mg total) by mouth daily. 03/15/16   Theora Gianotti, NP  atorvastatin (LIPITOR) 80 MG tablet TAKE 1 TABLET BY MOUTH EVERY EVENING AT 6 pm 04/02/18   Lelon Perla, MD  carvedilol (COREG) 6.25 MG tablet Take 1 tablet (6.25 mg total) by mouth 2 (two) times daily. 11/16/17 02/14/18  Lelon Perla, MD  D3 SUPER STRENGTH 2000 units CAPS Take 2,000 Units by mouth daily. 08/24/17   [provider]  gabapentin (NEURONTIN) 300 MG capsule Take 300 mg by mouth at bedtime. 09/26/17   [provider]  losartan (COZAAR) 25 MG tablet TAKE 1 TABLET BY MOUTH EVERY DAY. Needs office visit 11/09/17   Lelon Perla, MD  nitroGLYCERIN (NITROSTAT) 0.4 MG SL tablet Place  1 tablet (0.4 mg total) under the tongue every 5 (five) minutes as needed for chest pain. Patient not taking: Reported on 08/15/2018 10/10/17   Lendon Colonel, NP    Family History Family History  Problem Relation Age of Onset  . Heart attack Father   . Heart attack Sister     Social History Social History   Tobacco Use  . Smoking status: Current Some Day Smoker    Packs/day: 1.00    Years: 35.00    Pack years: 35.00    Types: Cigarettes  . Smokeless tobacco: Never Used  Substance Use Topics  . Alcohol use: Yes    Alcohol/week: 0.0 standard drinks    Comment: pint liquor (gin) daily  . Drug use: Yes    Comment: cocaine     Allergies   Other   Review of  Systems Review of Systems  Musculoskeletal: Positive for arthralgias.  All other systems reviewed and are negative.    Physical Exam Updated Vital Signs BP (!) 148/97 (BP Location: Right Arm)   Pulse 83   Temp 97.9 F (36.6 C) (Oral)   Resp 16   SpO2 96%   Physical Exam  Constitutional: He is oriented to person, place, and time. He appears well-developed and well-nourished.  HENT:  Head: Normocephalic and atraumatic.  Mouth/Throat: Oropharynx is clear and moist.  Eyes: Pupils are equal, round, and reactive to light. Conjunctivae and EOM are normal.  Neck: Normal range of motion.  Cardiovascular: Normal rate, regular rhythm and normal heart sounds.  Pulmonary/Chest: Effort normal and breath sounds normal.  Abdominal: Soft. Bowel sounds are normal.  Musculoskeletal: Normal range of motion.  Tenderness along medial joint line along superior portion of tibia; there is very small area of swelling without significant effusion; maintains nearly full flexion/extension of the knee; no warmth to touch or overlying skin changes; ambulatory  Neurological: He is alert and oriented to person, place, and time.  Skin: Skin is warm and dry.  Psychiatric: He has a normal mood and affect.  Nursing note and vitals reviewed.    ED Treatments / Results  Labs (all labs ordered are listed, but only abnormal results are displayed) Labs Reviewed - No data to display  EKG None  Radiology Dg Knee Complete 4 Views Left  Result Date: 08/16/2018 CLINICAL DATA:  Left knee pain EXAM: LEFT KNEE - COMPLETE 4+ VIEW COMPARISON:  None. FINDINGS: No acute bony abnormality. Specifically, no fracture, subluxation, or dislocation. Suspect small joint effusion. Early joint space narrowing and spurring in the medial compartment. IMPRESSION: No acute bony abnormality. Suspect small joint effusion. Early osteoarthritis. Electronically Signed   By: Rolm Baptise M.D.   On: 08/16/2018 02:04    Procedures Procedures  (including critical care time)  Medications Ordered in ED Medications  naproxen (NAPROSYN) tablet 500 mg (500 mg Oral Given 08/16/18 0353)     Initial Impression / Assessment and Plan / ED Course  I have reviewed the triage vital signs and the nursing notes.  Pertinent labs & imaging results that were available during my care of the patient were reviewed by me and considered in my medical decision making (see chart for details).  62 year old male here with left knee pain for the past 6 days.  Pain along the medial joint line of left knee.  Small amount of swelling but no overlying skin changes or warmth to touch.  Maintains nearly full flexion extension, ambulatory with steady gait.  Patient reports he has a  history of same but has not "flared up" and several years.  X-ray with arthritis and spurring in the medial compartment of left knee.  Suspect this is likely etiology of his symptoms.  He does not have any signs or symptoms suggestive of septic joint.  Patient placed in knee sleeve and started on anti-inflammatories.  Encouraged ice and elevation.  Close follow-up with PCP.  Return here for any new or worsening symptoms.  Final Clinical Impressions(s) / ED Diagnoses   Final diagnoses:  Acute pain of left knee    ED Discharge Orders         Ordered    meloxicam (MOBIC) 7.5 MG tablet  Daily     08/16/18 0355           Larene Pickett, PA-C 08/16/18 2334    Duffy Bruce, MD 08/16/18 1921

## 2018-10-04 ENCOUNTER — Telehealth: Payer: Self-pay

## 2018-10-04 NOTE — Telephone Encounter (Signed)
   [facility] Pre-operative Risk Assessment    Request for surgical clearance:  1. What type of surgery is being performed? Left partial knee replacement  2. When is this surgery scheduled? TBD  3. What type of clearance is required (medical clearance vs. Pharmacy clearance to hold med vs. Both)? Both  4. Are there any medications that need to be held prior to surgery and how long? Aspirin   5. Practice name and name of physician performing surgery? [facility] Dr.Timothy Percell Miller  6. What is your office phone number [phone]   7.   What is your office fax number [phone]  8.   Anesthesia type  not listed   Kathyrn Lass 10/04/2018, 12:53 PM  _________________________________________________________________   (provider comments below)

## 2018-10-05 NOTE — Telephone Encounter (Signed)
Surgery scheduled for March. Pt has an OV with Dr Stanford Breed 10/19/2018. I spoke with the patient today- he is having some atypical chest pain. Pre op clearance will be addressed at that office visit.  Kerin Ransom PA-C 10/05/2018 3:09 PM

## 2018-10-08 NOTE — Progress Notes (Signed)
HPI: Follow-up coronary artery disease. Note h/o substance abuse. Patient previously admitted with non-ST elevation myocardial infarction. Echocardiogram July 2017 showed ejection fraction 35-40% and grade 2 diastolic dysfunction. Cardiac catheterization July 2017 showed mild LV systolic dysfunction, 16% second marginal. Patient had PCI of the second marginal with a drug-eluting stent. Patient had cough with ACEI. Echocardiogram February 2019 showed ejection fraction 35-40%.  Nuclear study February 2019 showed ejection fraction 34%, prior inferior infarct but no ischemia.  Since last seen,he has dyspnea with more vigorous activities.  No orthopnea, PND, pedal edema, exertional chest pain or syncope.  Current Outpatient Medications  Medication Sig Dispense Refill  . aspirin EC 81 MG EC tablet Take 1 tablet (81 mg total) by mouth daily.    Marland Kitchen atorvastatin (LIPITOR) 80 MG tablet TAKE 1 TABLET BY MOUTH EVERY EVENING AT 6 pm 90 tablet 2  . carvedilol (COREG) 6.25 MG tablet Take 1 tablet (6.25 mg total) by mouth 2 (two) times daily. 180 tablet 3  . D3 SUPER STRENGTH 2000 units CAPS Take 2,000 Units by mouth daily.  0  . gabapentin (NEURONTIN) 300 MG capsule Take 300 mg by mouth at bedtime.  1  . losartan (COZAAR) 25 MG tablet TAKE 1 TABLET BY MOUTH EVERY DAY. Needs office visit 90 tablet 3  . meloxicam (MOBIC) 7.5 MG tablet Take 2 tablets (15 mg total) by mouth daily. 30 tablet 0  . nitroGLYCERIN (NITROSTAT) 0.4 MG SL tablet Place 1 tablet (0.4 mg total) under the tongue every 5 (five) minutes as needed for chest pain. (Patient not taking: Reported on 10/19/2018) 25 tablet 3   No current facility-administered medications for this visit.      Past Medical History:  Diagnosis Date  . CAD (coronary artery disease)    a. 03/2016 NSTEMI/PCI: LM nl, LAD nl, RI nl, LCX nl, OM2 99 ( 2.75 x 16 Synergy DES), RCA nl, EF 45-50%.  . Cocaine abuse ([place])   . ETOH abuse   . Hyperlipidemia   . Hypertensive  heart disease   . Ischemic cardiomyopathy    a. 03/2016 Echo: EF 35-40%, diff H, sev inflat HK, Gr2 DD;  b. 03/2016 EF 45-50% by LV gram.  . LVF (left ventricular failure) ([place])   . Tobacco abuse     Past Surgical History:  Procedure Laterality Date  . CARDIAC CATHETERIZATION N/A 03/14/2016   Procedure: Left Heart Cath and Coronary Angiography;  Surgeon: Burnell Blanks, MD;  Location: [facility];  Service: Cardiovascular;  Laterality: N/A;  . No prior surgery      Social History   Socioeconomic History  . Marital status: Legally Separated    Spouse name: Not on file  . Number of children: 8  . Years of education: Not on file  . Highest education level: Not on file  Occupational History  . Not on file  Social Needs  . Financial resource strain: Not on file  . Food insecurity:    Worry: Not on file    Inability: Not on file  . Transportation needs:    Medical: Not on file    Non-medical: Not on file  Tobacco Use  . Smoking status: Current Some Day Smoker    Packs/day: 1.00    Years: 35.00    Pack years: 35.00    Types: Cigarettes  . Smokeless tobacco: Never Used  Substance and Sexual Activity  . Alcohol use: Yes    Alcohol/week: 0.0 standard drinks  Comment: pint liquor (gin) daily  . Drug use: Yes    Comment: cocaine  . Sexual activity: Not on file  Lifestyle  . Physical activity:    Days per week: Not on file    Minutes per session: Not on file  . Stress: Not on file  Relationships  . Social connections:    Talks on phone: Not on file    Gets together: Not on file    Attends religious service: Not on file    Active member of club or organization: Not on file    Attends meetings of clubs or organizations: Not on file    Relationship status: Not on file  . Intimate partner violence:    Fear of current or ex partner: Not on file    Emotionally abused: Not on file    Physically abused: Not on file    Forced sexual activity: Not on file  Other  Topics Concern  . Not on file  Social History Narrative  . Not on file    Family History  Problem Relation Age of Onset  . Heart attack Father   . Heart attack Sister     ROS: Knee arthralgias but no fevers or chills, productive cough, hemoptysis, dysphasia, odynophagia, melena, hematochezia, dysuria, hematuria, rash, seizure activity, orthopnea, PND, pedal edema, claudication. Remaining systems are negative.  Physical Exam: Well-developed well-nourished in no acute distress.  Skin is warm and dry.  HEENT is normal.  Neck is supple.  Chest is clear to auscultation with normal expansion.  Cardiovascular exam is regular rate and rhythm.  Abdominal exam nontender or distended. No masses palpated. Extremities show no edema. neuro grossly intact  Electrocardiogram August 15, 2018 personally reviewed-sinus rhythm, inferolateral T wave inversion, cannot rule out prior inferior infarct.  A/P  1 coronary artery disease-patient denies exertional chest pain.  Plan to continue medical therapy with aspirin and statin.  2 ischemic cardiomyopathy-no evidence of congestive heart failure.  Continue beta-blocker and ARB (increase losartan to 50 mg daily).  Will repeat echocardiogram to reassess LV function.  Check potassium and renal function.  3 hyperlipidemia-continue statin.  Check lipids and liver.  4 tobacco abuse-patient counseled on discontinuing.  5 substance abuse-patient denies recent use.  6 preoperative evaluation prior to knee replacement-his only limitation is knee pain.  He has mild dyspnea on exertion but there is no exertional chest pain.  He may proceed without further ischemia evaluation.  Kirk Ruths, MD

## 2018-10-17 ENCOUNTER — Ambulatory Visit: Payer: Medicaid Other | Admitting: Physical Therapy

## 2018-10-18 ENCOUNTER — Ambulatory Visit: Payer: Medicaid Other | Admitting: Neurology

## 2018-10-18 ENCOUNTER — Encounter: Payer: Self-pay | Admitting: Neurology

## 2018-10-18 VITALS — BP 150/98 | HR 79 | Ht 65.0 in | Wt 177.0 lb

## 2018-10-18 DIAGNOSIS — G4719 Other hypersomnia: Secondary | ICD-10-CM

## 2018-10-18 DIAGNOSIS — R0683 Snoring: Secondary | ICD-10-CM | POA: Diagnosis not present

## 2018-10-18 DIAGNOSIS — G478 Other sleep disorders: Secondary | ICD-10-CM | POA: Diagnosis not present

## 2018-10-18 DIAGNOSIS — I214 Non-ST elevation (NSTEMI) myocardial infarction: Secondary | ICD-10-CM

## 2018-10-18 DIAGNOSIS — I255 Ischemic cardiomyopathy: Secondary | ICD-10-CM

## 2018-10-18 DIAGNOSIS — F17200 Nicotine dependence, unspecified, uncomplicated: Secondary | ICD-10-CM

## 2018-10-18 NOTE — Patient Instructions (Signed)

## 2018-10-18 NOTE — Progress Notes (Signed)
Subjective:    Patient ID: Lucas Stark is a 63 y.o. male.  HPI     Star Age, MD, PhD [facility] [address]O. [place], [place] [address]   Dear Dr. Lavonia Drafts,   I saw your patient, Lucas Stark, upon your kind request in the sleep clinic today for initial consultation of his sleep disorder, in particular, concern for underlying obstructive sleep apnea. The patient is unaccompanied today. As you know, [name] is a 63 year old right-handed gentleman with an underlying medical history of hypertension, smoking, coronary artery disease with history of non-STEMI in 2017 and history of stent placement, ischemic cardiomyopathy, history of cocaine abuse (none in 2 years, per pt) and alcohol abuse by chart review (but patient reports occasional use, only on WEs), and overweight state, who reports difficulty waking up, sleep paralysis, and excessive daytime somnolence. I reviewed your office note from 07/27/2018, which you kindly included.  Lives with GF, has been noted to snoring, he feels like he has to wake himself up. GF works 2nd shift.  His BT varies, no set time, rise time usually 5:30 or 6. No night to night nocturia, no AM HAs. He sees cardiology, has appt tomorrow, has had CP on the right recently, feels muscular to him, different from when he had MI. He has L partial knee replacement planned in March. No FHx of OSA. He smokes one pack per day, drinks alcohol on the weekend, denies any illicit drug use and denies caffeine use. He is not aware of any family history of OSA.  His Past Medical History Is Significant For: Past Medical History:  Diagnosis Date  . CAD (coronary artery disease)    a. 03/2016 NSTEMI/PCI: LM nl, LAD nl, RI nl, LCX nl, OM2 99 ( 2.75 x 16 Synergy DES), RCA nl, EF 45-50%.  . Cocaine abuse ([place])   . ETOH abuse   . Hyperlipidemia   . Hypertensive heart disease   . Ischemic cardiomyopathy    a. 03/2016 Echo: EF 35-40%, diff H,  sev inflat HK, Gr2 DD;  b. 03/2016 EF 45-50% by LV gram.  . LVF (left ventricular failure) ([place])   . Tobacco abuse     His Past Surgical History Is Significant For: Past Surgical History:  Procedure Laterality Date  . CARDIAC CATHETERIZATION N/A 03/14/2016   Procedure: Left Heart Cath and Coronary Angiography;  Surgeon: Burnell Blanks, MD;  Location: [facility];  Service: Cardiovascular;  Laterality: N/A;  . No prior surgery      His Family History Is Significant For: Family History  Problem Relation Age of Onset  . Heart attack Father   . Heart attack Sister     His Social History Is Significant For: Social History   Socioeconomic History  . Marital status: Legally Separated    Spouse name: Not on file  . Number of children: 8  . Years of education: Not on file  . Highest education level: Not on file  Occupational History  . Not on file  Social Needs  . Financial resource strain: Not on file  . Food insecurity:    Worry: Not on file    Inability: Not on file  . Transportation needs:    Medical: Not on file    Non-medical: Not on file  Tobacco Use  . Smoking status: Current Some Day Smoker    Packs/day: 1.00    Years: 35.00    Pack years: 35.00  Types: Cigarettes  . Smokeless tobacco: Never Used  Substance and Sexual Activity  . Alcohol use: Yes    Alcohol/week: 0.0 standard drinks    Comment: pint liquor (gin) daily  . Drug use: Yes    Comment: cocaine  . Sexual activity: Not on file  Lifestyle  . Physical activity:    Days per week: Not on file    Minutes per session: Not on file  . Stress: Not on file  Relationships  . Social connections:    Talks on phone: Not on file    Gets together: Not on file    Attends religious service: Not on file    Active member of club or organization: Not on file    Attends meetings of clubs or organizations: Not on file    Relationship status: Not on file  Other Topics Concern  . Not on file  Social  History Narrative  . Not on file    His Allergies Are:  Allergies  Allergen Reactions  . Other Itching    Sleeping pills  :   His Current Medications Are:  Outpatient Encounter Medications as of 10/18/2018  Medication Sig  . aspirin EC 81 MG EC tablet Take 1 tablet (81 mg total) by mouth daily.  Marland Kitchen atorvastatin (LIPITOR) 80 MG tablet TAKE 1 TABLET BY MOUTH EVERY EVENING AT 6 pm  . D3 SUPER STRENGTH 2000 units CAPS Take 2,000 Units by mouth daily.  Marland Kitchen gabapentin (NEURONTIN) 300 MG capsule Take 300 mg by mouth at bedtime.  Marland Kitchen losartan (COZAAR) 25 MG tablet TAKE 1 TABLET BY MOUTH EVERY DAY. Needs office visit  . meloxicam (MOBIC) 7.5 MG tablet Take 2 tablets (15 mg total) by mouth daily.  . nitroGLYCERIN (NITROSTAT) 0.4 MG SL tablet Place 1 tablet (0.4 mg total) under the tongue every 5 (five) minutes as needed for chest pain.  . carvedilol (COREG) 6.25 MG tablet Take 1 tablet (6.25 mg total) by mouth 2 (two) times daily.   No facility-administered encounter medications on file as of 10/18/2018.   :  Review of Systems:  Out of a complete 14 point review of systems, all are reviewed and negative with the exception of these symptoms as listed below: Review of Systems  Neurological:       Pt presents today to discuss his sleep. Pt has never had a sleep study and doesn't think he snores. Pt has spells when he is sleeping when it is hard to arouse him and he has to have someone shake him out of it.  Epworth Sleepiness Scale 0= would never doze 1= slight chance of dozing 2= moderate chance of dozing 3= high chance of dozing  Sitting and reading: 0 Watching TV: 0 Sitting inactive in a public place (ex. Theater or meeting): 0 As a passenger in a car for an hour without a break: 0 Lying down to rest in the afternoon: 0 Sitting and talking to someone: 0 Sitting quietly after lunch (no alcohol): 0 In a car, while stopped in traffic: 0 Total: 0     Objective:  Neurological  Exam  Physical Exam Physical Examination:   Vitals:   10/18/18 1109  BP: (!) 150/98  Pulse: 79    General Examination: The patient is a very pleasant 63 y.o. male in no acute distress. He appears well-developed and well-nourished and adequately groomed.   HEENT: Normocephalic, atraumatic, pupils are equal, round and reactive to light and accommodation. Extraocular tracking is good without limitation to  gaze excursion or nystagmus noted. Normal smooth pursuit is noted. Hearing is grossly intact. Face is symmetric with normal facial animation. Speech is clear with no dysarthria noted. There is no hypophonia. There is no lip, neck/head, jaw or voice tremor. Neck is supple with full range of passive and active motion. There are no carotid bruits on auscultation. Oropharynx exam reveals: moderate mouth dryness, edentulous on top, several missing teeth on the bottom, mild to moderate airway crowding noted secondary to smaller airway entry, tonsils in place which are about 1+ bilaterally, redundant soft palate and longer tongue. Mallampati is class II. Neck circumference is 15-3/4 inches. Tongue protrudes centrally and palate elevates symmetrically.  Chest: Clear to auscultation without wheezing, rhonchi or crackles noted.  Heart: S1+S2+0, regular and normal without murmurs, rubs or gallops noted.   Abdomen: Soft, non-tender and non-distended with normal bowel sounds appreciated on auscultation.  Extremities: There is no pitting edema in the distal lower extremities bilaterally.  Skin: Warm and dry without trophic changes noted.   Musculoskeletal: exam reveals L knee swelling and discomfort.   Neurologically:  Mental status: The patient is awake, alert and oriented in all 4 spheres. His immediate and remote memory, attention, language skills and fund of knowledge are appropriate. There is no evidence of aphasia, agnosia, apraxia or anomia. Speech is clear with normal prosody and enunciation.  Thought process is linear. Mood is normal and affect is normal.  Cranial nerves II - XII are as described above under HEENT exam. In addition: shoulder shrug is normal with equal shoulder height noted. Motor exam: Normal bulk, strength and tone is noted. There is no drift, tremor or rebound. Romberg is negative. Fine motor skills and coordination: intact with normal finger taps, normal hand movements, normal rapid alternating patting, normal foot taps and normal foot agility.  Cerebellar testing: No dysmetria or intention tremor. There is no truncal or gait ataxia.  Sensory exam: intact to light touch in the upper and lower extremities.  Gait, station and balance: He stands easily. No veering to one side is noted. No leaning to one side is noted. Posture is age-appropriate and stance is narrow based. Gait shows normal stride length and normal pace. No problems turning are noted. Tandem walk is unremarkable.  Assessment and Plan:  In summary, TAJON MORING is a very pleasant 63 y.o.-year old male with an underlying medical history of hypertension, smoking, coronary artery disease with history of non-STEMI in 2017 and history of stent placement, ischemic cardiomyopathy, history of cocaine abuse in the past and overweight state,whose history and physical examination are concerning for underlying obstructive sleep apnea (OSA). I had a long chat with the patient about my findings and the diagnosis of OSA, its prognosis and treatment options. We talked about medical treatments, surgical interventions and non-pharmacological approaches. I explained in particular the risks and ramifications of untreated moderate to severe OSA, especially with respect to developing cardiovascular disease down the Road, including congestive heart failure, difficult to treat hypertension, cardiac arrhythmias, or stroke. Even type 2 diabetes has, in part, been linked to untreated OSA. Symptoms of untreated OSA include daytime  sleepiness, memory problems, mood irritability and mood disorder such as depression and anxiety, lack of energy, as well as recurrent headaches, especially morning headaches. We talked about smoking cessation and trying to maintain a healthy lifestyle in general, as well as the importance of weight control. I encouraged the patient to eat healthy, exercise daily and keep well hydrated, to keep a scheduled  bedtime and wake time routine, to not skip any meals and eat healthy snacks in between meals. I advised the patient not to drive when feeling sleepy. I recommended the following at this time: sleep study with potential positive airway pressure titration. (We will score hypopneas at 4%).   I explained the sleep test procedure to the patient and also outlined possible surgical and non-surgical treatment options of OSA, including the use of a custom-made dental device (which would require a referral to a specialist dentist or oral surgeon), upper airway surgical options, such as pillar implants, radiofrequency surgery, tongue base surgery, and UPPP (which would involve a referral to an ENT surgeon). Rarely, jaw surgery such as mandibular advancement may be considered.  I also explained the CPAP treatment option to the patient, who indicated that he would be willing to try CPAP if the need arises. I explained the importance of being compliant with PAP treatment, not only for insurance purposes but primarily to improve His symptoms, and for the patient's long term health benefit, including to reduce His cardiovascular risks. I answered all his questions today and the patient was in agreement. I plan to see him back after the sleep study is completed and encouraged him to call with any interim questions, concerns, problems or updates.   Thank you very much for allowing me to participate in the care of this nice patient. If I can be of any further assistance to you please do not hesitate to call me at  [phone].  Sincerely,   Star Age, MD, PhD

## 2018-10-19 ENCOUNTER — Encounter: Payer: Self-pay | Admitting: Cardiology

## 2018-10-19 ENCOUNTER — Ambulatory Visit (INDEPENDENT_AMBULATORY_CARE_PROVIDER_SITE_OTHER): Payer: Medicaid Other | Admitting: Cardiology

## 2018-10-19 VITALS — BP 136/84 | HR 58 | Ht 66.0 in | Wt 180.2 lb

## 2018-10-19 DIAGNOSIS — E7800 Pure hypercholesterolemia, unspecified: Secondary | ICD-10-CM

## 2018-10-19 DIAGNOSIS — I255 Ischemic cardiomyopathy: Secondary | ICD-10-CM

## 2018-10-19 DIAGNOSIS — I2510 Atherosclerotic heart disease of native coronary artery without angina pectoris: Secondary | ICD-10-CM

## 2018-10-19 LAB — COMPREHENSIVE METABOLIC PANEL
A/G RATIO: 1.6 (ref 1.2–2.2)
ALT: 16 IU/L (ref 0–44)
AST: 23 IU/L (ref 0–40)
Albumin: 3.9 g/dL (ref 3.8–4.8)
Alkaline Phosphatase: 72 IU/L (ref 39–117)
BUN/Creatinine Ratio: 10 (ref 10–24)
BUN: 11 mg/dL (ref 8–27)
Bilirubin Total: 0.6 mg/dL (ref 0.0–1.2)
CHLORIDE: 103 mmol/L (ref 96–106)
CO2: 23 mmol/L (ref 20–29)
Calcium: 9.1 mg/dL (ref 8.6–10.2)
Creatinine, Ser: 1.05 mg/dL (ref 0.76–1.27)
GFR calc Af Amer: 88 mL/min/{1.73_m2} (ref >59)
GFR calc non Af Amer: 76 mL/min/{1.73_m2} (ref >59)
GLOBULIN, TOTAL: 2.4 g/dL (ref 1.5–4.5)
Glucose: 98 mg/dL (ref 65–99)
Potassium: 4.2 mmol/L (ref 3.5–5.2)
Sodium: 141 mmol/L (ref 134–144)
Total Protein: 6.3 g/dL (ref 6.0–8.5)

## 2018-10-19 LAB — LIPID PANEL
Chol/HDL Ratio: 3.3 ratio (ref 0.0–5.0)
Cholesterol, Total: 138 mg/dL (ref 100–199)
HDL: 42 mg/dL (ref >39)
LDL Calculated: 70 mg/dL (ref 0–99)
Triglycerides: 130 mg/dL (ref 0–149)
VLDL Cholesterol Cal: 26 mg/dL (ref 5–40)

## 2018-10-19 MED ORDER — LOSARTAN POTASSIUM 50 MG PO TABS: 50.0000 mg | ORAL_TABLET | Freq: Every day | ORAL | 3 refills | Status: DC

## 2018-10-19 NOTE — Patient Instructions (Signed)
Medication Instructions:  INCREASE LOSARTAN TO 50 MG ONCE DAILY= 2 OF THE 25 MG TABLETS ONCE DAILY If you need a refill on your cardiac medications before your next appointment, please call your pharmacy.   Lab work: Your physician recommends that you HAVE LAB WORK TODAY If you have labs (blood work) drawn today and your tests are completely normal, you will receive your results only by: Marland Kitchen MyChart Message (if you have MyChart) OR . A paper copy in the mail If you have any lab test that is abnormal or we need to change your treatment, we will call you to review the results.  Testing/Procedures: Your physician has requested that you have an echocardiogram. Echocardiography is a painless test that uses sound waves to create images of your heart. It provides your doctor with information about the size and shape of your heart and how well your heart's chambers and valves are working. This procedure takes approximately one hour. There are no restrictions for this procedure.  [place]  Follow-Up: At [facility], you and your health needs are our priority.  As part of our continuing mission to provide you with exceptional heart care, we have created designated Provider Care Teams.  These Care Teams include your primary Cardiologist (physician) and Advanced Practice Providers (APPs -  Physician Assistants and Nurse Practitioners) who all work together to provide you with the care you need, when you need it. You will need a follow up appointment in 6 months.  Please call our office 2 months in advance to schedule this appointment.  You may see Kirk Ruths MD or one of the following Advanced Practice Providers on your designated Care Team:   Kerin Ransom, PA-C Roby Lofts, [place] . Sande Rives, PA-C CALL IN June TO SCHEDULE APPOINTMENT IN [place]

## 2018-10-22 ENCOUNTER — Encounter: Payer: Self-pay | Admitting: *Deleted

## 2018-10-24 ENCOUNTER — Ambulatory Visit (HOSPITAL_COMMUNITY): Payer: Medicaid Other | Attending: Cardiology

## 2018-10-24 DIAGNOSIS — I255 Ischemic cardiomyopathy: Secondary | ICD-10-CM | POA: Insufficient documentation

## 2018-10-24 DIAGNOSIS — M1712 Unilateral primary osteoarthritis, left knee: Secondary | ICD-10-CM | POA: Diagnosis present

## 2018-10-24 NOTE — H&P (Signed)
KNEE ARTHROPLASTY ADMISSION H&P  Patient ID: Lucas Stark MRN: [id number] DOB/AGE: Jan 16, 1956 63 y.o.  Chief Complaint: left knee pain.  Planned Procedure Date: 11/13/2018 Medical Clearance by Dr. Maebelle Munroe pending Cardiac Clearance by Dr. Janna Arch pending Sleep Study 11/11/18 for possible OSA   HPI: Lucas Stark is a 63 y.o. male smoker (quitting) with a history of MI status post stenting 9675.  h/o illicit substance abuse prior to this MI.  He presents today for evaluation of OA LEFT KNEE. The patient has a history of pain and functional disability in the left knee due to arthritis and has failed non-surgical conservative treatments for greater than 12 weeks to include NSAID's and/or analgesics, corticosteriod injections and activity modification.  Onset of symptoms was gradual, starting 3 years ago with gradually worsening course since that time.  Patient currently rates pain at 8 out of 10 with activity. Patient has night pain, worsening of pain with activity and weight bearing and pain that interferes with activities of daily living.  Patient has evidence of periarticular osteophytes and joint space narrowing by imaging studies.  There is no active infection.  Past Medical History:  Diagnosis Date  . CAD (coronary artery disease)    a. 03/2016 NSTEMI/PCI: LM nl, LAD nl, RI nl, LCX nl, OM2 99 ( 2.75 x 16 Synergy DES), RCA nl, EF 45-50%.  . Cocaine abuse ([place])   . ETOH abuse   . Hyperlipidemia   . Hypertensive heart disease   . Ischemic cardiomyopathy    a. 03/2016 Echo: EF 35-40%, diff H, sev inflat HK, Gr2 DD;  b. 03/2016 EF 45-50% by LV gram.  . LVF (left ventricular failure) ([place])   . Tobacco abuse    Past Surgical History:  Procedure Laterality Date  . CARDIAC CATHETERIZATION N/A 03/14/2016   Procedure: Left Heart Cath and Coronary Angiography;  Surgeon: Burnell Blanks, MD;  Location: [facility];  Service: Cardiovascular;  Laterality: N/A;  . No prior surgery      Allergies  Allergen Reactions  . Other Itching    Sleeping pills   Prior to Admission medications   Medication Sig Start Date End Date Taking? Authorizing Provider  aspirin EC 81 MG EC tablet Take 1 tablet (81 mg total) by mouth daily. 03/15/16   Theora Gianotti, NP  atorvastatin (LIPITOR) 80 MG tablet TAKE 1 TABLET BY MOUTH EVERY EVENING AT 6 pm 04/02/18   Lelon Perla, MD  carvedilol (COREG) 6.25 MG tablet Take 1 tablet (6.25 mg total) by mouth 2 (two) times daily. 11/16/17 10/19/18  Lelon Perla, MD  D3 SUPER STRENGTH 2000 units CAPS Take 2,000 Units by mouth daily. 08/24/17   [provider]  gabapentin (NEURONTIN) 300 MG capsule Take 300 mg by mouth at bedtime. 09/26/17   [provider]  losartan (COZAAR) 50 MG tablet Take 1 tablet (50 mg total) by mouth daily. 10/19/18   Lelon Perla, MD         nitroGLYCERIN (NITROSTAT) 0.4 MG SL tablet Place 1 tablet (0.4 mg total) under the tongue every 5 (five) minutes as needed for chest pain. Patient not taking: Reported on 10/19/2018 10/10/17   Lendon Colonel, NP   Social History   Socioeconomic History  . Marital status: Legally Separated    Spouse name: Not on file  . Number of children: 8  . Years of education: Not on file  . Highest education level: Not on file  Occupational History  .  Not on file  Social Needs  . Financial resource strain: Not on file  . Food insecurity:    Worry: Not on file    Inability: Not on file  . Transportation needs:    Medical: Not on file    Non-medical: Not on file  Tobacco Use  . Smoking status: Current Some Day Smoker    Packs/day: 1.00    Years: 35.00    Pack years: 35.00    Types: Cigarettes  . Smokeless tobacco: Never Used  Substance and Sexual Activity  . Alcohol use: Yes    Alcohol/week: 0.0 standard drinks    Comment: pint liquor (gin) daily  . Drug use: Yes    Comment: cocaine  . Sexual activity: Not on file  Lifestyle  . Physical  activity:    Days per week: Not on file    Minutes per session: Not on file  . Stress: Not on file  Relationships  . Social connections:    Talks on phone: Not on file    Gets together: Not on file    Attends religious service: Not on file    Active member of club or organization: Not on file    Attends meetings of clubs or organizations: Not on file    Relationship status: Not on file  Other Topics Concern  . Not on file  Social History Narrative  . Not on file   Family History  Problem Relation Age of Onset  . Heart attack Father   . Heart attack Sister     ROS: Currently denies lightheadedness, dizziness, Fever, chills, CP.  Some SOB w/ exertion (improved since cutting back on smoking). No personal history of DVT, PE, or CVA. No loose teeth.  Has dentures and partial. All other systems have been reviewed and were otherwise currently negative with the exception of those mentioned in the HPI and as above.  Objective: Vitals: Ht: 5'8" Wt: 184 Temp: 97.4 BP: 164/89 Pulse: 62 O2 97% on room air.   Physical Exam: General: Alert, NAD.  Antalgic Gait  HEENT: EOMI, Good Neck Extension  Pulm: No increased work of breathing.  Clear B/L A/P w/o crackle or wheeze.  CV: RRR, No m/g/r appreciated  GI: soft, NT, ND Neuro: Neuro without gross focal deficit.  Sensation intact distally Skin: No lesions in the area of chief complaint MSK/Surgical Site: Left knee w/o redness or sign of infection.  Moderate + effusion.  Medial JLT. ROM 2-115.  5/5 strength in extension and flexion.  +EHL/FHL.  NVI.  Stable varus and valgus stress.    Imaging Review Plain radiographs demonstrate severe degenerative joint disease of the medial compartment left knee.   Preoperative templating of the joint replacement has been completed, documented, and submitted to the Operating Room personnel in order to optimize intra-operative equipment management.  Assessment: OA LEFT KNEE Principal Problem:   Primary  osteoarthritis of left knee Active Problems:   History of non-ST elevation myocardial infarction (NSTEMI)   Benign essential HTN   Tobacco abuse   Dyslipidemia, goal LDL below 70   CAD S/P percutaneous coronary angioplasty   Cardiomyopathy, ischemic   Plan: Plan for Procedure(s): UNICOMPARTMENTAL KNEE  The patient history, physical exam, clinical judgement of the provider and imaging are consistent with end stage degenerative joint disease and unicompartmental joint arthroplasty is deemed medically necessary. The treatment options including medical management, injection therapy, and arthroplasty were discussed at length. The risks and benefits of Procedure(s): UNICOMPARTMENTAL KNEE were presented and  reviewed.  The risks of nonoperative treatment, versus surgical intervention including but not limited to continued pain, aseptic loosening, stiffness, dislocation/subluxation, infection, bleeding, nerve injury, blood clots, cardiopulmonary complications, morbidity, mortality, among others were discussed. The patient verbalizes understanding and wishes to proceed with the plan.  Patient is being admitted for inpatient treatment for surgery, pain control, PT, OT, prophylactic antibiotics, VTE prophylaxis, progressive ambulation, ADL's and discharge planning.   Dental prophylaxis discussed and recommended for 2 years postoperatively.   The patient does meet the criteria for TXA which will be used perioperatively.    ASA 81 mg BID will be used postoperatively for DVT prophylaxis in addition to SCDs, and early ambulation.  Sleep Study 11/11/18 for possible OSA.  He would like to be covered up / not see the inside of the OR.  The patient is planning to be discharged to home with [facility]PP[facility] in care of [facility], girlfriend and 8 children.  Antony Madura, oldest girl has helped w/ medical paperwork etc.   Patient's anticipated LOS is less than 2 midnights, meeting these requirements: - Younger than  43 - Lives within 1 hour of care - Has a competent adult at home to recover with post-op recover - NO history of  - Chronic pain requiring opiods  - Diabetes  - Coronary Artery Disease  - Heart failure  - Stroke  - DVT/VTE  - Cardiac arrhythmia  - Respiratory Failure/COPD  - Renal failure  - Anemia  - Advanced Liver disease   Prudencio Burly III, PA-C 10/24/2018 2:35 PM

## 2018-11-01 NOTE — Patient Instructions (Addendum)
Lucas Stark  11/01/2018   Your procedure is scheduled on:  11-13-2018    Report to [facility] Main  Entrance,  Report to admitting at  7:30 AM    Call this number if you have problems the morning of surgery [phone]        Remember: Do not eat food or drink liquids :After Midnight.  This includes no water, candy, gum, mints  BRUSH YOUR TEETH MORNING OF SURGERY AND RINSE YOUR MOUTH OUT       Take these medicines the morning of surgery with A SIP OF WATER:   Carvedilol (coreg),  Atorvastatin (lipitor)                                   You may not have any metal on your body including  Body piercings.               Do not wear jewelry, lotions, powders or perfumes, deodorant              Men may shave face and neck.      Do not bring valuables to the hospital. [name].  Contacts, dentures or bridgework may not be worn into surgery.  Leave suitcase in the car. After surgery it may be brought to your room.    _____________________________________________________________________           [facility] - Preparing for Surgery Before surgery, you can play an important role.  Because skin is not sterile, your skin needs to be as free of germs as possible.  You can reduce the number of germs on your skin by washing with CHG (chlorahexidine gluconate) soap before surgery.  CHG is an antiseptic cleaner which kills germs and bonds with the skin to continue killing germs even after washing. Please DO NOT use if you have an allergy to CHG or antibacterial soaps.  If your skin becomes reddened/irritated stop using the CHG and inform your nurse when you arrive at Short Stay. Do not shave (including legs and underarms) for at least 48 hours prior to the first CHG shower.  You may shave your face/neck. Please follow these instructions carefully:  1.  Shower with CHG Soap the night before surgery and the   morning of Surgery.  2.  If you choose to wash your hair, wash your hair first as usual with your  normal  shampoo.  3.  After you shampoo, rinse your hair and body thoroughly to remove the  shampoo.                           4.  Use CHG as you would any other liquid soap.  You can apply chg directly  to the skin and wash                       Gently with a scrungie or clean washcloth.  5.  Apply the CHG Soap to your body ONLY FROM THE NECK DOWN.   Do not use on face/ open  Wound or open sores. Avoid contact with eyes, ears mouth and genitals (private parts).                       Wash face,  Genitals (private parts) with your normal soap.             6.  Wash thoroughly, paying special attention to the area where your surgery  will be performed.  7.  Thoroughly rinse your body with warm water from the neck down.  8.  DO NOT shower/wash with your normal soap after using and rinsing off  the CHG Soap.             9.  Pat yourself dry with a clean towel.            10.  Wear clean pajamas.            11.  Place clean sheets on your bed the night of your first shower and do not  sleep with pets. Day of Surgery : Do not apply any lotions/deodorants the morning of surgery.  Please wear clean clothes to the hospital/surgery center.  FAILURE TO FOLLOW THESE INSTRUCTIONS MAY RESULT IN THE CANCELLATION OF YOUR SURGERY PATIENT SIGNATURE_________________________________  NURSE SIGNATURE__________________________________  ________________________________________________________________________   Lucas Stark  An incentive spirometer is a tool that can help keep your lungs clear and active. This tool measures how well you are filling your lungs with each breath. Taking long deep breaths may help reverse or decrease the chance of developing breathing (pulmonary) problems (especially infection) following:  A long period of time when you are unable to move or be  active. BEFORE THE PROCEDURE   If the spirometer includes an indicator to show your best effort, your nurse or respiratory therapist will set it to a desired goal.  If possible, sit up straight or lean slightly forward. Try not to slouch.  Hold the incentive spirometer in an upright position. INSTRUCTIONS FOR USE  1. Sit on the edge of your bed if possible, or sit up as far as you can in bed or on a chair. 2. Hold the incentive spirometer in an upright position. 3. Breathe out normally. 4. Place the mouthpiece in your mouth and seal your lips tightly around it. 5. Breathe in slowly and as deeply as possible, raising the piston or the ball toward the top of the column. 6. Hold your breath for 3-5 seconds or for as long as possible. Allow the piston or ball to fall to the bottom of the column. 7. Remove the mouthpiece from your mouth and breathe out normally. 8. Rest for a few seconds and repeat Steps 1 through 7 at least 10 times every 1-2 hours when you are awake. Take your time and take a few normal breaths between deep breaths. 9. The spirometer may include an indicator to show your best effort. Use the indicator as a goal to work toward during each repetition. 10. After each set of 10 deep breaths, practice coughing to be sure your lungs are clear. If you have an incision (the cut made at the time of surgery), support your incision when coughing by placing a pillow or rolled up towels firmly against it. Once you are able to get out of bed, walk around indoors and cough well. You may stop using the incentive spirometer when instructed by your caregiver.  RISKS AND COMPLICATIONS  Take your time so you do not get dizzy or light-headed.  If you are in pain, you may need to take or ask for pain medication before doing incentive spirometry. It is harder to take a deep breath if you are having pain. AFTER USE  Rest and breathe slowly and easily.  It can be helpful to keep track of a log of  your progress. Your caregiver can provide you with a simple table to help with this. If you are using the spirometer at home, follow these instructions: [name] IF:   You are having difficultly using the spirometer.  You have trouble using the spirometer as often as instructed.  Your pain medication is not giving enough relief while using the spirometer.  You develop fever of 100.5 F (38.1 C) or higher. SEEK IMMEDIATE MEDICAL CARE IF:   You cough up bloody sputum that had not been present before.  You develop fever of 102 F (38.9 C) or greater.  You develop worsening pain at or near the incision site. MAKE SURE YOU:   Understand these instructions.  Will watch your condition.  Will get help right away if you are not doing well or get worse. Document Released: 01/09/2007 Document Revised: 11/21/2011 Document Reviewed: 03/12/2007 ExitCare Patient Information 2014 ExitCare, [place].   ________________________________________________________________________  WHAT IS A BLOOD TRANSFUSION? Blood Transfusion Information  A transfusion is the replacement of blood or some of its parts. Blood is made up of multiple cells which provide different functions.  Red blood cells carry oxygen and are used for blood loss replacement.  White blood cells fight against infection.  Platelets control bleeding.  Plasma helps clot blood.  Other blood products are available for specialized needs, such as hemophilia or other clotting disorders. BEFORE THE TRANSFUSION  Who gives blood for transfusions?   Healthy volunteers who are fully evaluated to make sure their blood is safe. This is blood bank blood. Transfusion therapy is the safest it has ever been in the practice of medicine. Before blood is taken from a donor, a complete history is taken to make sure that person has no history of diseases nor engages in risky social behavior (examples are intravenous drug use or sexual activity  with multiple partners). The donor's travel history is screened to minimize risk of transmitting infections, such as malaria. The donated blood is tested for signs of infectious diseases, such as HIV and hepatitis. The blood is then tested to be sure it is compatible with you in order to minimize the chance of a transfusion reaction. If you or a relative donates blood, this is often done in anticipation of surgery and is not appropriate for emergency situations. It takes many days to process the donated blood. RISKS AND COMPLICATIONS Although transfusion therapy is very safe and saves many lives, the main dangers of transfusion include:   Getting an infectious disease.  Developing a transfusion reaction. This is an allergic reaction to something in the blood you were given. Every precaution is taken to prevent this. The decision to have a blood transfusion has been considered carefully by your caregiver before blood is given. Blood is not given unless the benefits outweigh the risks. AFTER THE TRANSFUSION  Right after receiving a blood transfusion, you will usually feel much better and more energetic. This is especially true if your red blood cells have gotten low (anemic). The transfusion raises the level of the red blood cells which carry oxygen, and this usually causes an energy increase.  The nurse administering the transfusion will monitor you carefully for  complications. HOME CARE INSTRUCTIONS  No special instructions are needed after a transfusion. You may find your energy is better. Speak with your caregiver about any limitations on activity for underlying diseases you may have. SEEK MEDICAL CARE IF:   Your condition is not improving after your transfusion.  You develop redness or irritation at the intravenous (IV) site. SEEK IMMEDIATE MEDICAL CARE IF:  Any of the following symptoms occur over the next 12 hours:  Shaking chills.  You have a temperature by mouth above 102 F (38.9  C), not controlled by medicine.  Chest, back, or muscle pain.  People around you feel you are not acting correctly or are confused.  Shortness of breath or difficulty breathing.  Dizziness and fainting.  You get a rash or develop hives.  You have a decrease in urine output.  Your urine turns a dark color or changes to pink, red, or brown. Any of the following symptoms occur over the next 10 days:  You have a temperature by mouth above 102 F (38.9 C), not controlled by medicine.  Shortness of breath.  Weakness after normal activity.  The white part of the eye turns yellow (jaundice).  You have a decrease in the amount of urine or are urinating less often.  Your urine turns a dark color or changes to pink, red, or brown. Document Released: 08/26/2000 Document Revised: 11/21/2011 Document Reviewed: 04/14/2008 [facility] Patient Information 2014 [place], [place].  _______________________________________________________________________

## 2018-11-06 ENCOUNTER — Encounter (HOSPITAL_COMMUNITY): Payer: Self-pay

## 2018-11-06 ENCOUNTER — Encounter (HOSPITAL_COMMUNITY)
Admission: RE | Admit: 2018-11-06 | Discharge: 2018-11-06 | Disposition: A | Discharge status: Home / Self Care | Payer: Medicaid Other | Source: Ambulatory Visit | Attending: Orthopedic Surgery | Admitting: Orthopedic Surgery

## 2018-11-06 ENCOUNTER — Other Ambulatory Visit: Payer: Self-pay

## 2018-11-06 DIAGNOSIS — Z01812 Encounter for preprocedural laboratory examination: Secondary | ICD-10-CM | POA: Insufficient documentation

## 2018-11-06 HISTORY — DX: Cocaine abuse, in remission: F14.11

## 2018-11-06 HISTORY — DX: Unspecified osteoarthritis, unspecified site: M19.90

## 2018-11-06 HISTORY — DX: Old myocardial infarction: I25.2

## 2018-11-06 HISTORY — DX: Presence of dental prosthetic device (complete) (partial): Z97.2

## 2018-11-06 HISTORY — DX: Presence of coronary angioplasty implant and graft: Z95.5

## 2018-11-06 LAB — SURGICAL PCR SCREEN
MRSA, PCR: NEGATIVE
Staphylococcus aureus: NEGATIVE

## 2018-11-06 LAB — CBC
HCT: 48.1 % (ref 39.0–52.0)
Hemoglobin: 15.8 g/dL (ref 13.0–17.0)
MCH: 31.2 pg (ref 26.0–34.0)
MCHC: 32.8 g/dL (ref 30.0–36.0)
MCV: 94.9 fL (ref 80.0–100.0)
PLATELETS: 185 10*3/uL (ref 150–400)
RBC: 5.07 MIL/uL (ref 4.22–5.81)
RDW: 14.1 % (ref 11.5–15.5)
WBC: 6.4 10*3/uL (ref 4.0–10.5)
nRBC: 0 % (ref 0.0–0.2)

## 2018-11-06 LAB — BASIC METABOLIC PANEL
Anion gap: 5 (ref 5–15)
BUN: 11 mg/dL (ref 8–23)
CALCIUM: 8.8 mg/dL — AB (ref 8.9–10.3)
CO2: 29 mmol/L (ref 22–32)
Chloride: 106 mmol/L (ref 98–111)
Creatinine, Ser: 1.04 mg/dL (ref 0.61–1.24)
GFR calc Af Amer: >60 mL/min (ref >60)
GFR calc non Af Amer: >60 mL/min (ref >60)
Glucose, Bld: 113 mg/dL — ABNORMAL HIGH (ref 70–99)
Potassium: 3.8 mmol/L (ref 3.5–5.1)
Sodium: 140 mmol/L (ref 135–145)

## 2018-11-06 LAB — RAPID URINE DRUG SCREEN, HOSP PERFORMED
Amphetamines: NOT DETECTED
Barbiturates: NOT DETECTED
Benzodiazepines: NOT DETECTED
Cocaine: NOT DETECTED
Opiates: NOT DETECTED
Tetrahydrocannabinol: NOT DETECTED

## 2018-11-06 NOTE — Progress Notes (Addendum)
EKG dated 08-15-2018 in epic. ECHO dated 10-24-2018 om epic. Nuclear stress test dated 11-07-2017 in epic. Cardiac cath dated 03-14-2016 in epic.  Cardiac clearance w/ last office note dated 10-19-2018 in epic,  Dr Stanford Breed.  ADDENDUM:  Chart to anesthesia for review, Konrad Felix PA.

## 2018-11-08 NOTE — Progress Notes (Signed)
Anesthesia Chart Review   Case:  [id number] Date/Time:  11/13/18 0945   Procedure:  UNICOMPARTMENTAL KNEE (Left )   Anesthesia type:  Choice   Pre-op diagnosis:  OA LEFT KNEE   Location:  [facility] ROOM 08 / [facility]   Surgeon:  Renette Butters, MD      DISCUSSION: 63 yo current some day smoker (35 pack years) with h/o HTN, ETOH abuse, CAD (NSTEMI s/p DES x1 to OM1 2017), HLD, ischemic cardiomyopathy, h/o cocaine abuse (last use 2017), left knee OA scheduled for above procedure 11/13/18 with Dr. Edmonia Lynch.   Pt last seen by Cardiologist Dr. Kirk Ruths on 10/19/2018.  Per his note, "He has mild dyspnea on exertion but there is no exertional chest pain.  He may proceed without further ischemia evaluation."  Pt can proceed with planned procedure barring acute status change.  VS: BP (!) 151/84   Pulse 62   Temp 36.5 C (Oral)   Resp 16   Ht 5\' 5"  (1.651 m)   Wt 81.6 kg   SpO2 100%   BMI 29.95 kg/m   PROVIDERS: Rogers Blocker, MD is PCP   Kirk Ruths, MD is Cardiologist  LABS: Labs reviewed: Acceptable for surgery. (all labs ordered are listed, but only abnormal results are displayed)  Labs Reviewed  BASIC METABOLIC PANEL - Abnormal; Notable for the following components:      Result Value   Glucose, Bld 113 (*)    Calcium 8.8 (*)    All other components within normal limits  SURGICAL PCR SCREEN  CBC  RAPID URINE DRUG SCREEN, HOSP PERFORMED     IMAGES:   EKG: 08/15/18 Rate 71 bpm Normal sinus rhythm  Cannot rule out inferior infarct, age undetermined T wave abnormality, consider lateral ischemia  Abnormal ECG   CV: Stress Test 11/07/17  The left ventricular ejection fraction is moderately decreased (30-44%).  Nuclear stress EF: 34%.  Blood pressure demonstrated a normal response to exercise.  There was no ST segment deviation noted during stress.  Findings consistent with prior myocardial infarction.  This is an intermediate risk study.   Inferior wall  infarct at mid and apical level no ischemia  Diffuse hypokinesis EF 34%  Echo 10/24/18 IMPRESSIONS    1. The left ventricle has mildly reduced systolic function, with an ejection fraction of 45-50%. The cavity size was normal. There is mild asymmetric left ventricular hypertrophy of the septal wall. Left ventricular diastolic Doppler parameters are  consistent with impaired relaxation.  2. There is mild hypokinesis of the entire inferior left ventricular segment.  3. The right ventricle has normal systolic function. The cavity was normal. There is no increase in right ventricular wall thickness.  4. The mitral valve is normal in structure.  5. The tricuspid valve is normal in structure.  6. The aortic valve is normal in structure.  7. The pulmonic valve was normal in structure.  8. Right atrial pressure is estimated at 3 mmHg. Past Medical History:  Diagnosis Date  . CAD (coronary artery disease) cardiologist-- dr Stanford Breed   a. 03/2016 NSTEMI/PCI: LM nl, LAD nl, RI nl, LCX nl, OM2 99 ( 2.75 x 16 Synergy DES), RCA nl, EF 45-50%.  Marland Kitchen ETOH abuse   . History of cocaine abuse ([place])    per pt none since 2017  . History of non-ST elevation myocardial infarction (NSTEMI) 03/14/2016   s/p  PCI and DES x1  . Hyperlipidemia   . Hypertensive heart disease   .  Ischemic cardiomyopathy    a. 03/2016 Echo: EF 35-40%, diff H, sev inflat HK, Gr2 DD;  b. 03/2016 EF 45-50% by LV gram.;  echo 10-24-2018, ef 45-50%  . OA (osteoarthritis)    left knee  . S/P drug eluting coronary stent placement 03/14/2016   DES x1 to OM1  . Tobacco abuse   . Wears dentures    upper  . Wears partial dentures    lower    Past Surgical History:  Procedure Laterality Date  . CARDIAC CATHETERIZATION N/A 03/14/2016   Procedure: Left Heart Cath and Coronary Angiography;  Surgeon: Burnell Blanks, MD;  Location: [facility];  Service: Cardiovascular;  Laterality: N/A;  . LACERATION REPAIR  age 59   face around  eye    MEDICATIONS: . aspirin EC 81 MG EC tablet  . atorvastatin (LIPITOR) 80 MG tablet  . carvedilol (COREG) 6.25 MG tablet  . D3 SUPER STRENGTH 2000 units CAPS  . losartan (COZAAR) 50 MG tablet  . meloxicam (MOBIC) 7.5 MG tablet  . nitroGLYCERIN (NITROSTAT) 0.4 MG SL tablet   No current facility-administered medications for this encounter.      Maia Plan [facility] Pre-Surgical Testing [phone] 11/08/18 2:14 PM

## 2018-11-08 NOTE — Anesthesia Preprocedure Evaluation (Addendum)
Anesthesia Evaluation  Patient identified by MRN, date of birth, ID band Patient awake    Reviewed: Allergy & Precautions, H&P , NPO status , Patient's Chart, lab work & pertinent test results  Airway Mallampati: II  TM Distance: >3 FB Neck ROM: Full    Dental no notable dental hx. (+) Edentulous Upper, Poor Dentition, Dental Advisory Given,    Pulmonary sleep apnea , Current Smoker,    Pulmonary exam normal breath sounds clear to auscultation       Cardiovascular hypertension, Pt. on medications and Pt. on home beta blockers + CAD and + Past MI  Normal cardiovascular exam Rhythm:Regular Rate:Normal  10/24/2018 Echo 1. The left ventricle has mildly reduced systolic function, with an ejection fraction of 45-50%. The cavity size was normal. There is mild asymmetric left ventricular hypertrophy of the septal wall. Left ventricular diastolic Doppler parameters are  consistent with impaired relaxation.  2. There is mild hypokinesis of the entire inferior left ventricular segment.  08/15/18 EKG  SR R 71 w Q in 2,3, AVF  And NS St changes     Neuro/Psych PSYCHIATRIC DISORDERS negative psych ROS   GI/Hepatic (+)     substance abuse  alcohol use,   Endo/Other    Renal/GU CR 1.04     Musculoskeletal  (+) Arthritis ,   Abdominal   Peds  Hematology Hgb 15.8 Plt 185  T&S available   Anesthesia Other Findings   Reproductive/Obstetrics                          Anesthesia Physical Anesthesia Plan  ASA: III  Anesthesia Plan: Spinal   Post-op Pain Management:  [facility] for Post-op pain   Induction:   PONV Risk Score and Plan: Treatment may vary due to age or medical condition, Ondansetron and Dexamethasone  Airway Management Planned: Nasal Cannula and Natural Airway  Additional Equipment:   Intra-op Plan:   Post-operative Plan:   Informed Consent: I have reviewed the patients History and  Physical, chart, labs and discussed the procedure including the risks, benefits and alternatives for the proposed anesthesia with the patient or authorized representative who has indicated his/her understanding and acceptance.     Dental advisory given  Plan Discussed with:   Anesthesia Plan Comments: (See PAT note 11/06/18, Konrad Felix, PA-C  L Uni Knee w adductor canal and spinal)       Anesthesia Quick Evaluation

## 2018-11-11 ENCOUNTER — Ambulatory Visit (INDEPENDENT_AMBULATORY_CARE_PROVIDER_SITE_OTHER): Payer: Medicaid Other | Admitting: Neurology

## 2018-11-11 DIAGNOSIS — G4719 Other hypersomnia: Secondary | ICD-10-CM

## 2018-11-11 DIAGNOSIS — I214 Non-ST elevation (NSTEMI) myocardial infarction: Secondary | ICD-10-CM

## 2018-11-11 DIAGNOSIS — F17200 Nicotine dependence, unspecified, uncomplicated: Secondary | ICD-10-CM

## 2018-11-11 DIAGNOSIS — G4733 Obstructive sleep apnea (adult) (pediatric): Secondary | ICD-10-CM

## 2018-11-11 DIAGNOSIS — G4720 Circadian rhythm sleep disorder, unspecified type: Secondary | ICD-10-CM

## 2018-11-11 DIAGNOSIS — I255 Ischemic cardiomyopathy: Secondary | ICD-10-CM

## 2018-11-11 DIAGNOSIS — R0683 Snoring: Secondary | ICD-10-CM

## 2018-11-11 DIAGNOSIS — G478 Other sleep disorders: Secondary | ICD-10-CM

## 2018-11-12 MED ORDER — BUPIVACAINE LIPOSOME 1.3 % IJ SUSP
20.0000 mL | Freq: Once | INTRAMUSCULAR | Status: DC
  Filled 2018-11-12: qty 20

## 2018-11-13 ENCOUNTER — Telehealth: Payer: Self-pay

## 2018-11-13 ENCOUNTER — Ambulatory Visit (HOSPITAL_COMMUNITY): Payer: Medicaid Other | Admitting: Certified Registered"

## 2018-11-13 ENCOUNTER — Other Ambulatory Visit: Payer: Self-pay

## 2018-11-13 ENCOUNTER — Ambulatory Visit (HOSPITAL_COMMUNITY): Payer: Medicaid Other | Admitting: Physician Assistant

## 2018-11-13 ENCOUNTER — Observation Stay (HOSPITAL_COMMUNITY): Payer: Medicaid Other

## 2018-11-13 ENCOUNTER — Encounter (HOSPITAL_COMMUNITY)
Admission: RE | Disposition: A | Discharge status: Home / Self Care | Payer: Self-pay | Source: Other Acute Inpatient Hospital | Attending: Orthopedic Surgery

## 2018-11-13 ENCOUNTER — Encounter (HOSPITAL_COMMUNITY): Payer: Self-pay | Admitting: *Deleted

## 2018-11-13 ENCOUNTER — Observation Stay (HOSPITAL_COMMUNITY)
Admission: RE | Admit: 2018-11-13 | Discharge: 2018-11-14 | Disposition: A | Discharge status: Home / Self Care | Payer: Medicaid Other | Source: Other Acute Inpatient Hospital | Attending: Orthopedic Surgery | Admitting: Orthopedic Surgery

## 2018-11-13 DIAGNOSIS — E785 Hyperlipidemia, unspecified: Secondary | ICD-10-CM | POA: Diagnosis not present

## 2018-11-13 DIAGNOSIS — I119 Hypertensive heart disease without heart failure: Secondary | ICD-10-CM | POA: Diagnosis not present

## 2018-11-13 DIAGNOSIS — M1712 Unilateral primary osteoarthritis, left knee: Principal | ICD-10-CM | POA: Insufficient documentation

## 2018-11-13 DIAGNOSIS — F17210 Nicotine dependence, cigarettes, uncomplicated: Secondary | ICD-10-CM | POA: Diagnosis not present

## 2018-11-13 DIAGNOSIS — I255 Ischemic cardiomyopathy: Secondary | ICD-10-CM

## 2018-11-13 DIAGNOSIS — Z7982 Long term (current) use of aspirin: Secondary | ICD-10-CM | POA: Insufficient documentation

## 2018-11-13 DIAGNOSIS — Z9861 Coronary angioplasty status: Secondary | ICD-10-CM

## 2018-11-13 DIAGNOSIS — Z79899 Other long term (current) drug therapy: Secondary | ICD-10-CM | POA: Insufficient documentation

## 2018-11-13 DIAGNOSIS — F1410 Cocaine abuse, uncomplicated: Secondary | ICD-10-CM | POA: Insufficient documentation

## 2018-11-13 DIAGNOSIS — Z955 Presence of coronary angioplasty implant and graft: Secondary | ICD-10-CM | POA: Insufficient documentation

## 2018-11-13 DIAGNOSIS — Z720 Tobacco use: Secondary | ICD-10-CM | POA: Diagnosis present

## 2018-11-13 DIAGNOSIS — I2510 Atherosclerotic heart disease of native coronary artery without angina pectoris: Secondary | ICD-10-CM | POA: Diagnosis not present

## 2018-11-13 DIAGNOSIS — F1010 Alcohol abuse, uncomplicated: Secondary | ICD-10-CM | POA: Insufficient documentation

## 2018-11-13 DIAGNOSIS — I252 Old myocardial infarction: Secondary | ICD-10-CM | POA: Insufficient documentation

## 2018-11-13 DIAGNOSIS — I10 Essential (primary) hypertension: Secondary | ICD-10-CM | POA: Diagnosis present

## 2018-11-13 DIAGNOSIS — M1710 Unilateral primary osteoarthritis, unspecified knee: Secondary | ICD-10-CM | POA: Diagnosis present

## 2018-11-13 DIAGNOSIS — Z96659 Presence of unspecified artificial knee joint: Secondary | ICD-10-CM

## 2018-11-13 HISTORY — PX: PARTIAL KNEE ARTHROPLASTY: SHX2174

## 2018-11-13 SURGERY — ARTHROPLASTY, KNEE, UNICOMPARTMENTAL
Anesthesia: Spinal | Site: Knee | Laterality: Left

## 2018-11-13 MED ORDER — FENTANYL CITRATE (PF) 100 MCG/2ML IJ SOLN
INTRAMUSCULAR | Status: AC
  Filled 2018-11-13: qty 2

## 2018-11-13 MED ORDER — CEFAZOLIN SODIUM-DEXTROSE 2-4 GM/100ML-% IV SOLN
2.0000 g | Freq: Four times a day (QID) | INTRAVENOUS | Status: AC
  Administered 2018-11-13 (×2): 2 g via INTRAVENOUS
  Filled 2018-11-13 (×2): qty 100

## 2018-11-13 MED ORDER — FENTANYL CITRATE (PF) 100 MCG/2ML IJ SOLN
50.0000 ug | INTRAMUSCULAR | Status: DC
  Administered 2018-11-13: 100 ug via INTRAVENOUS

## 2018-11-13 MED ORDER — CARVEDILOL 6.25 MG PO TABS
6.2500 mg | ORAL_TABLET | ORAL | Status: AC
  Administered 2018-11-13: 6.25 mg via ORAL
  Filled 2018-11-13: qty 1

## 2018-11-13 MED ORDER — PROPOFOL 10 MG/ML IV BOLUS
INTRAVENOUS | Status: AC
  Filled 2018-11-13: qty 20

## 2018-11-13 MED ORDER — DEXAMETHASONE SODIUM PHOSPHATE 10 MG/ML IJ SOLN
10.0000 mg | Freq: Once | INTRAMUSCULAR | Status: AC
  Administered 2018-11-14: 10 mg via INTRAVENOUS
  Filled 2018-11-13: qty 1

## 2018-11-13 MED ORDER — ASPIRIN 81 MG PO CHEW
81.0000 mg | CHEWABLE_TABLET | Freq: Two times a day (BID) | ORAL | Status: DC
  Administered 2018-11-13 – 2018-11-14 (×2): 81 mg via ORAL
  Filled 2018-11-13 (×2): qty 1

## 2018-11-13 MED ORDER — PHENOL 1.4 % MT LIQD
1.0000 | OROMUCOSAL | Status: DC | PRN
  Filled 2018-11-13: qty 177

## 2018-11-13 MED ORDER — ASPIRIN EC 81 MG PO TBEC: 81.0000 mg | DELAYED_RELEASE_TABLET | Freq: Two times a day (BID) | ORAL | 0 refills | Status: DC

## 2018-11-13 MED ORDER — TRANEXAMIC ACID-NACL 1000-0.7 MG/100ML-% IV SOLN
1000.0000 mg | INTRAVENOUS | Status: AC
  Administered 2018-11-13: 1000 mg via INTRAVENOUS
  Filled 2018-11-13: qty 100

## 2018-11-13 MED ORDER — PHENYLEPHRINE 40 MCG/ML (10ML) SYRINGE FOR IV PUSH (FOR BLOOD PRESSURE SUPPORT)
PREFILLED_SYRINGE | INTRAVENOUS | Status: DC | PRN
  Administered 2018-11-13 (×3): 80 ug via INTRAVENOUS

## 2018-11-13 MED ORDER — MIDAZOLAM HCL 2 MG/2ML IJ SOLN
INTRAMUSCULAR | Status: AC
  Administered 2018-11-13: 2 mg via INTRAVENOUS
  Filled 2018-11-13: qty 2

## 2018-11-13 MED ORDER — POLYETHYLENE GLYCOL 3350 17 G PO PACK
17.0000 g | PACK | Freq: Every day | ORAL | Status: DC | PRN
  Administered 2018-11-14: 17 g via ORAL
  Filled 2018-11-13: qty 1

## 2018-11-13 MED ORDER — SORBITOL 70 % SOLN
30.0000 mL | Freq: Every day | Status: DC | PRN
  Filled 2018-11-13: qty 30

## 2018-11-13 MED ORDER — PROPOFOL 10 MG/ML IV BOLUS
INTRAVENOUS | Status: AC
  Filled 2018-11-13: qty 40

## 2018-11-13 MED ORDER — PHENYLEPHRINE 40 MCG/ML (10ML) SYRINGE FOR IV PUSH (FOR BLOOD PRESSURE SUPPORT)
PREFILLED_SYRINGE | INTRAVENOUS | Status: AC
  Filled 2018-11-13: qty 10

## 2018-11-13 MED ORDER — ACETAMINOPHEN 325 MG PO TABS: 325.0000 mg | ORAL_TABLET | Freq: Four times a day (QID) | ORAL | Status: DC | PRN

## 2018-11-13 MED ORDER — ONDANSETRON HCL 4 MG/2ML IJ SOLN
INTRAMUSCULAR | Status: AC
  Filled 2018-11-13: qty 2

## 2018-11-13 MED ORDER — SODIUM CHLORIDE 0.9 % IV SOLN
INTRAVENOUS | Status: DC | PRN
  Administered 2018-11-13: 50 mL

## 2018-11-13 MED ORDER — 0.9 % SODIUM CHLORIDE (POUR BTL) OPTIME
TOPICAL | Status: DC | PRN
  Administered 2018-11-13: 1000 mL

## 2018-11-13 MED ORDER — ONDANSETRON HCL 4 MG/2ML IJ SOLN
INTRAMUSCULAR | Status: DC | PRN
  Administered 2018-11-13: 4 mg via INTRAVENOUS

## 2018-11-13 MED ORDER — GABAPENTIN 300 MG PO CAPS
300.0000 mg | ORAL_CAPSULE | Freq: Three times a day (TID) | ORAL | Status: DC
  Administered 2018-11-13 – 2018-11-14 (×3): 300 mg via ORAL
  Filled 2018-11-13 (×3): qty 1

## 2018-11-13 MED ORDER — OXYCODONE HCL 5 MG PO TABS: 5.0000 mg | ORAL_TABLET | ORAL | Status: DC | PRN

## 2018-11-13 MED ORDER — ONDANSETRON HCL 4 MG PO TABS: 4.0000 mg | ORAL_TABLET | Freq: Three times a day (TID) | ORAL | 0 refills | Status: DC | PRN

## 2018-11-13 MED ORDER — LACTATED RINGERS IV SOLN
INTRAVENOUS | Status: DC
  Administered 2018-11-13 – 2018-11-14 (×2): via INTRAVENOUS

## 2018-11-13 MED ORDER — POVIDONE-IODINE 10 % EX SWAB
2.0000 "application " | Freq: Once | CUTANEOUS | Status: AC
  Administered 2018-11-13: 2 via TOPICAL

## 2018-11-13 MED ORDER — ATORVASTATIN CALCIUM 40 MG PO TABS
80.0000 mg | ORAL_TABLET | Freq: Every morning | ORAL | Status: DC
  Administered 2018-11-13 – 2018-11-14 (×2): 80 mg via ORAL
  Filled 2018-11-13: qty 2

## 2018-11-13 MED ORDER — HYDROMORPHONE HCL 1 MG/ML IJ SOLN: 0.5000 mg | INTRAMUSCULAR | Status: DC | PRN

## 2018-11-13 MED ORDER — DIPHENHYDRAMINE HCL 12.5 MG/5ML PO ELIX: 12.5000 mg | ORAL_SOLUTION | ORAL | Status: DC | PRN

## 2018-11-13 MED ORDER — ACETAMINOPHEN 500 MG PO TABS
1000.0000 mg | ORAL_TABLET | Freq: Once | ORAL | Status: AC
  Administered 2018-11-13: 1000 mg via ORAL
  Filled 2018-11-13: qty 2

## 2018-11-13 MED ORDER — ONDANSETRON HCL 4 MG PO TABS: 4.0000 mg | ORAL_TABLET | Freq: Four times a day (QID) | ORAL | Status: DC | PRN

## 2018-11-13 MED ORDER — GABAPENTIN 300 MG PO CAPS: 300.0000 mg | ORAL_CAPSULE | Freq: Three times a day (TID) | ORAL | 0 refills | Status: DC

## 2018-11-13 MED ORDER — PROMETHAZINE HCL 25 MG/ML IJ SOLN: 6.2500 mg | INTRAMUSCULAR | Status: DC | PRN

## 2018-11-13 MED ORDER — PROPOFOL 500 MG/50ML IV EMUL
INTRAVENOUS | Status: DC | PRN
  Administered 2018-11-13: 100 ug/kg/min via INTRAVENOUS

## 2018-11-13 MED ORDER — CHLORHEXIDINE GLUCONATE 4 % EX LIQD: 60.0000 mL | Freq: Once | CUTANEOUS | Status: DC

## 2018-11-13 MED ORDER — DOCUSATE SODIUM 100 MG PO CAPS
100.0000 mg | ORAL_CAPSULE | Freq: Two times a day (BID) | ORAL | Status: DC
  Administered 2018-11-13 – 2018-11-14 (×2): 100 mg via ORAL
  Filled 2018-11-13 (×2): qty 1

## 2018-11-13 MED ORDER — CARVEDILOL 6.25 MG PO TABS
6.2500 mg | ORAL_TABLET | Freq: Two times a day (BID) | ORAL | Status: DC
  Administered 2018-11-13 – 2018-11-14 (×3): 6.25 mg via ORAL
  Filled 2018-11-13 (×3): qty 1

## 2018-11-13 MED ORDER — GABAPENTIN 300 MG PO CAPS
300.0000 mg | ORAL_CAPSULE | Freq: Once | ORAL | Status: AC
  Administered 2018-11-13: 300 mg via ORAL
  Filled 2018-11-13: qty 1

## 2018-11-13 MED ORDER — MAGNESIUM CITRATE PO SOLN: 1.0000 | Freq: Once | ORAL | Status: DC | PRN

## 2018-11-13 MED ORDER — MEPERIDINE HCL 50 MG/ML IJ SOLN: 6.2500 mg | INTRAMUSCULAR | Status: DC | PRN

## 2018-11-13 MED ORDER — CEFAZOLIN SODIUM-DEXTROSE 2-4 GM/100ML-% IV SOLN
2.0000 g | INTRAVENOUS | Status: AC
  Administered 2018-11-13: 2 g via INTRAVENOUS
  Filled 2018-11-13: qty 100

## 2018-11-13 MED ORDER — ACETAMINOPHEN 10 MG/ML IV SOLN: 1000.0000 mg | Freq: Once | INTRAVENOUS | Status: DC | PRN

## 2018-11-13 MED ORDER — METHOCARBAMOL 500 MG PO TABS
500.0000 mg | ORAL_TABLET | Freq: Four times a day (QID) | ORAL | Status: DC | PRN
  Administered 2018-11-13: 500 mg via ORAL
  Filled 2018-11-13: qty 1

## 2018-11-13 MED ORDER — NICOTINE 14 MG/24HR TD PT24
14.0000 mg | MEDICATED_PATCH | Freq: Every day | TRANSDERMAL | Status: DC
  Administered 2018-11-13 – 2018-11-14 (×2): 14 mg via TRANSDERMAL
  Filled 2018-11-13 (×2): qty 1

## 2018-11-13 MED ORDER — SODIUM CHLORIDE 0.9 % IR SOLN
Status: DC | PRN
  Administered 2018-11-13: 1000 mL

## 2018-11-13 MED ORDER — HYDROCODONE-ACETAMINOPHEN 7.5-325 MG PO TABS: 1.0000 | ORAL_TABLET | Freq: Once | ORAL | Status: DC | PRN

## 2018-11-13 MED ORDER — KETOROLAC TROMETHAMINE 15 MG/ML IJ SOLN
15.0000 mg | Freq: Four times a day (QID) | INTRAMUSCULAR | Status: DC
  Administered 2018-11-13 – 2018-11-14 (×3): 15 mg via INTRAVENOUS
  Filled 2018-11-13 (×3): qty 1

## 2018-11-13 MED ORDER — METHOCARBAMOL 500 MG IVPB - SIMPLE MED
500.0000 mg | Freq: Four times a day (QID) | INTRAVENOUS | Status: DC | PRN
  Filled 2018-11-13: qty 50

## 2018-11-13 MED ORDER — METHOCARBAMOL 500 MG PO TABS: 500.0000 mg | ORAL_TABLET | Freq: Three times a day (TID) | ORAL | 0 refills | Status: DC | PRN

## 2018-11-13 MED ORDER — BUPIVACAINE IN DEXTROSE 0.75-8.25 % IT SOLN
INTRATHECAL | Status: DC | PRN
  Administered 2018-11-13: 2 mL via INTRATHECAL

## 2018-11-13 MED ORDER — FENTANYL CITRATE (PF) 100 MCG/2ML IJ SOLN
INTRAMUSCULAR | Status: AC
  Administered 2018-11-13: 100 ug via INTRAVENOUS
  Filled 2018-11-13: qty 2

## 2018-11-13 MED ORDER — DEXAMETHASONE SODIUM PHOSPHATE 10 MG/ML IJ SOLN
INTRAMUSCULAR | Status: DC | PRN
  Administered 2018-11-13: 10 mg via INTRAVENOUS

## 2018-11-13 MED ORDER — HYDROMORPHONE HCL 1 MG/ML IJ SOLN: 0.2500 mg | INTRAMUSCULAR | Status: DC | PRN

## 2018-11-13 MED ORDER — METOCLOPRAMIDE HCL 5 MG PO TABS: 5.0000 mg | ORAL_TABLET | Freq: Three times a day (TID) | ORAL | Status: DC | PRN

## 2018-11-13 MED ORDER — OXYCODONE HCL 5 MG PO TABS: 5.0000 mg | ORAL_TABLET | ORAL | 0 refills | Status: AC | PRN

## 2018-11-13 MED ORDER — MENTHOL 3 MG MT LOZG: 1.0000 | LOZENGE | OROMUCOSAL | Status: DC | PRN

## 2018-11-13 MED ORDER — LACTATED RINGERS IV SOLN
INTRAVENOUS | Status: DC
  Administered 2018-11-13: 09:00:00 via INTRAVENOUS

## 2018-11-13 MED ORDER — DEXAMETHASONE SODIUM PHOSPHATE 10 MG/ML IJ SOLN
INTRAMUSCULAR | Status: AC
  Filled 2018-11-13: qty 1

## 2018-11-13 MED ORDER — METOCLOPRAMIDE HCL 5 MG/ML IJ SOLN: 5.0000 mg | Freq: Three times a day (TID) | INTRAMUSCULAR | Status: DC | PRN

## 2018-11-13 MED ORDER — CLONIDINE HCL (ANALGESIA) 100 MCG/ML EP SOLN
EPIDURAL | Status: DC | PRN
  Administered 2018-11-13: 100 ug

## 2018-11-13 MED ORDER — ONDANSETRON HCL 4 MG/2ML IJ SOLN: 4.0000 mg | Freq: Four times a day (QID) | INTRAMUSCULAR | Status: DC | PRN

## 2018-11-13 MED ORDER — LOSARTAN POTASSIUM 50 MG PO TABS
50.0000 mg | ORAL_TABLET | Freq: Every day | ORAL | Status: DC
  Administered 2018-11-13 – 2018-11-14 (×2): 50 mg via ORAL
  Filled 2018-11-13 (×2): qty 1

## 2018-11-13 MED ORDER — SODIUM CHLORIDE (PF) 0.9 % IJ SOLN
INTRAMUSCULAR | Status: AC
  Filled 2018-11-13: qty 50

## 2018-11-13 MED ORDER — ACETAMINOPHEN 500 MG PO TABS: 1000.0000 mg | ORAL_TABLET | Freq: Three times a day (TID) | ORAL | 0 refills | Status: AC

## 2018-11-13 MED ORDER — ROPIVACAINE HCL 5 MG/ML IJ SOLN
INTRAMUSCULAR | Status: DC | PRN
  Administered 2018-11-13: 30 mL

## 2018-11-13 MED ORDER — ACETAMINOPHEN 500 MG PO TABS
1000.0000 mg | ORAL_TABLET | Freq: Three times a day (TID) | ORAL | Status: DC
  Administered 2018-11-13 – 2018-11-14 (×3): 1000 mg via ORAL
  Filled 2018-11-13 (×3): qty 2

## 2018-11-13 MED ORDER — DOCUSATE SODIUM 100 MG PO CAPS: 100.0000 mg | ORAL_CAPSULE | Freq: Two times a day (BID) | ORAL | 0 refills | Status: DC

## 2018-11-13 MED ORDER — MIDAZOLAM HCL 2 MG/2ML IJ SOLN
1.0000 mg | INTRAMUSCULAR | Status: DC
  Administered 2018-11-13: 2 mg via INTRAVENOUS

## 2018-11-13 SURGICAL SUPPLY — 49 items
BEARING TIBIAL OXFORD MED 4 (Orthopedic Implant) ×1 IMPLANT
BLADE SURG 15 STRL LF DISP TIS (BLADE) ×1 IMPLANT
BLADE SURG 15 STRL SS (BLADE) ×2
BNDG CMPR MED 10X6 ELC LF (GAUZE/BANDAGES/DRESSINGS) ×1
BNDG ELASTIC 6X10 VLCR STRL LF (GAUZE/BANDAGES/DRESSINGS) ×2 IMPLANT
BOWL SMART MIX CTS (DISPOSABLE) ×2 IMPLANT
BRNG TIB MED 4 PHS 3 LT MEN (Orthopedic Implant) ×1 IMPLANT
CEMENT BONE R 1X40 (Cement) ×1 IMPLANT
CHLORAPREP W/TINT 26ML (MISCELLANEOUS) ×2 IMPLANT
CLSR STERI-STRIP ANTIMIC 1/2X4 (GAUZE/BANDAGES/DRESSINGS) ×2 IMPLANT
COVER SURGICAL LIGHT HANDLE (MISCELLANEOUS) ×2 IMPLANT
COVER WAND RF STERILE (DRAPES) ×2 IMPLANT
CUFF TOURN SGL QUICK 34 (TOURNIQUET CUFF) ×2
CUFF TRNQT CYL 34X4.125X (TOURNIQUET CUFF) ×1 IMPLANT
DRAPE ARTHROSCOPY W/POUCH 114 (DRAPES) ×2 IMPLANT
DRAPE U-SHAPE 47X51 STRL (DRAPES) ×2 IMPLANT
DRSG MEPILEX BORDER 4X4 (GAUZE/BANDAGES/DRESSINGS) IMPLANT
DRSG MEPILEX BORDER 4X8 (GAUZE/BANDAGES/DRESSINGS) ×2 IMPLANT
ELECT REM PT RETURN 15FT ADLT (MISCELLANEOUS) ×2 IMPLANT
GLOVE BIO SURGEON STRL SZ7.5 (GLOVE) ×4 IMPLANT
GLOVE BIOGEL PI IND STRL 8 (GLOVE) ×2 IMPLANT
GLOVE BIOGEL PI INDICATOR 8 (GLOVE) ×2
GOWN STRL REUS W/ TWL LRG LVL3 (GOWN DISPOSABLE) ×1 IMPLANT
GOWN STRL REUS W/ TWL XL LVL3 (GOWN DISPOSABLE) ×1 IMPLANT
GOWN STRL REUS W/TWL LRG LVL3 (GOWN DISPOSABLE) ×2
GOWN STRL REUS W/TWL XL LVL3 (GOWN DISPOSABLE) ×2
HANDPIECE INTERPULSE COAX TIP (DISPOSABLE) ×2
IMMOBILIZER KNEE 20 (SOFTGOODS) ×3 IMPLANT
IMMOBILIZER KNEE 20 THIGH 36 (SOFTGOODS) ×1 IMPLANT
MANIFOLD NEPTUNE II (INSTRUMENTS) ×2 IMPLANT
NS IRRIG 1000ML POUR BTL (IV SOLUTION) ×2 IMPLANT
PACK BLADE SAW RECIP 70 3 PT (BLADE) ×1 IMPLANT
PACK ICE MAXI GEL EZY WRAP (MISCELLANEOUS) ×2 IMPLANT
PACK TOTAL KNEE CUSTOM (KITS) ×2 IMPLANT
PEG TWIN FEM CEMENTED MED (Knees) ×1 IMPLANT
PROTECTOR NERVE ULNAR (MISCELLANEOUS) ×2 IMPLANT
SET HNDPC FAN SPRY TIP SCT (DISPOSABLE) ×1 IMPLANT
STRIP CLOSURE SKIN 1/2X4 (GAUZE/BANDAGES/DRESSINGS) ×1 IMPLANT
SUCTION FRAZIER HANDLE 10FR (MISCELLANEOUS) ×1
SUCTION TUBE FRAZIER 10FR DISP (MISCELLANEOUS) ×1 IMPLANT
SUT MNCRL AB 4-0 PS2 18 (SUTURE) ×2 IMPLANT
SUT VIC AB 0 CT1 36 (SUTURE) ×2 IMPLANT
SUT VIC AB 1 CT1 36 (SUTURE) ×2 IMPLANT
SUT VIC AB 2-0 CT1 27 (SUTURE) ×2
SUT VIC AB 2-0 CT1 TAPERPNT 27 (SUTURE) ×1 IMPLANT
TRAY FOLEY MTR SLVR 16FR STAT (SET/KITS/TRAYS/PACK) ×2 IMPLANT
TRAY TIBIAL OXFORD SZ D LF (Joint) ×1 IMPLANT
WRAP KNEE MAXI GEL POST OP (GAUZE/BANDAGES/DRESSINGS) ×1 IMPLANT
YANKAUER SUCT BULB TIP 10FT TU (MISCELLANEOUS) ×2 IMPLANT

## 2018-11-13 NOTE — Progress Notes (Signed)
Patient referred by Dr. Lavonia Drafts, seen by me on 10/18/18, diagnostic PSG on 11/11/18.   Please call and notify the patient that the recent sleep study showed mild to moderate obstructive sleep apnea. I recommend treatment for this in the form of CPAP. This will require a repeat sleep study for proper titration and mask fitting and correct monitoring of the oxygen saturations. Please explain to patient. I have placed an order in the chart. Thanks.  Star Age, MD, PhD [facility])

## 2018-11-13 NOTE — Anesthesia Procedure Notes (Addendum)
Spinal  Patient location during procedure: OB Start time: 11/13/2018 10:57 AM End time: 11/13/2018 11:00 AM Staffing Anesthesiologist: Barnet Glasgow, MD Performed: anesthesiologist  Preanesthetic Checklist Completed: patient identified, surgical consent, pre-op evaluation, timeout performed, IV checked, risks and benefits discussed and monitors and equipment checked Spinal Block Patient position: sitting Prep: Betadine and site prepped and draped Patient monitoring: heart rate, cardiac monitor, continuous pulse ox and blood pressure Approach: midline Location: L3-4 Injection technique: single-shot Needle Needle type: Pencan  Needle gauge: 24 G Needle length: 10 cm Needle insertion depth: 6 cm Assessment Sensory level: T4 Additional Notes 1 Attempt (s). Pt tolerated procedure well.

## 2018-11-13 NOTE — Addendum Note (Signed)
Addended by: Star Age on: 11/13/2018 07:50 AM   Modules accepted: Orders

## 2018-11-13 NOTE — Discharge Instructions (Signed)
You may bear weight as tolerated. °Keep your dressing on and dry until follow up. °Take medicine to prevent blood clots as directed. °Take pain medicine as needed with the goal of transitioning to over the counter medicines.  °If needed, you may increase breakthrough pain medication (oxycodone) for the first few days post op - up to 2 tablets every 4 hours.  Stop as this medication as soon as you are able. ° °INSTRUCTIONS AFTER JOINT REPLACEMENT  ° °o Remove items at home which could result in a fall. This includes throw rugs or furniture in walking pathways °o ICE to the affected joint every three hours while awake for 30 minutes at a time, for at least the first 3-5 days, and then as needed for pain and swelling.  Continue to use ice for pain and swelling. You may notice swelling that will progress down to the foot and ankle.  This is normal after surgery.  Elevate your leg when you are not up walking on it.   °o Continue to use the breathing machine you got in the hospital (incentive spirometer) which will help keep your temperature down.  It is common for your temperature to cycle up and down following surgery, especially at night when you are not up moving around and exerting yourself.  The breathing machine keeps your lungs expanded and your temperature down. ° ° °DIET:  As you were doing prior to hospitalization, we recommend a well-balanced diet. ° °DRESSING / WOUND CARE / SHOWERING ° °You may shower 3 days after surgery, but keep the wounds dry during showering.  You may use an occlusive plastic wrap (Press'n Seal for example) with blue painter's tape at edges, NO SOAKING/SUBMERGING IN THE BATHTUB.  If the bandage gets wet, change with a clean dry gauze.  If the incision gets wet, pat the wound dry with a clean towel. ° °ACTIVITY ° °o Increase activity slowly as tolerated, but follow the weight bearing instructions below.   °o No driving for 6 weeks or until further direction given by your physician.  You  cannot drive while taking narcotics.  °o No lifting or carrying greater than 10 lbs. until further directed by your surgeon. °o Avoid periods of inactivity such as sitting longer than an hour when not asleep. This helps prevent blood clots.  °o You may return to work once you are authorized by your doctor.  ° ° ° °WEIGHT BEARING  ° °Weight bearing as tolerated with assist device (walker, cane, etc) as directed, use it as long as suggested by your surgeon or therapist, typically at least 4-6 weeks. ° ° °EXERCISES ° °Results after joint replacement surgery are often greatly improved when you follow the exercise, range of motion and muscle strengthening exercises prescribed by your doctor. Safety measures are also important to protect the joint from further injury. Any time any of these exercises cause you to have increased pain or swelling, decrease what you are doing until you are comfortable again and then slowly increase them. If you have problems or questions, call your caregiver or physical therapist for advice.  ° °Rehabilitation is important following a joint replacement. After just a few days of immobilization, the muscles of the leg can become weakened and shrink (atrophy).  These exercises are designed to build up the tone and strength of the thigh and leg muscles and to improve motion. Often times heat used for twenty to thirty minutes before working out will loosen up your tissues and help   with improving the range of motion but do not use heat for the first two weeks following surgery (sometimes heat can increase post-operative swelling).   These exercises can be done on a training (exercise) mat, on the floor, on a table or on a bed. Use whatever works the best and is most comfortable for you.    Use music or television while you are exercising so that the exercises are a pleasant break in your day. This will make your life better with the exercises acting as a break in your routine that you can look  forward to.   Perform all exercises about fifteen times, three times per day or as directed.  You should exercise both the operative leg and the other leg as well.  Exercises include:    Quad Sets - Tighten up the muscle on the front of the thigh (Quad) and hold for 5-10 seconds.    Straight Leg Raises - With your knee straight (if you were given a brace, keep it on), lift the leg to 60 degrees, hold for 3 seconds, and slowly lower the leg.  Perform this exercise against resistance later as your leg gets stronger.   Leg Slides: Lying on your back, slowly slide your foot toward your buttocks, bending your knee up off the floor (only go as far as is comfortable). Then slowly slide your foot back down until your leg is flat on the floor again.   Angel Wings: Lying on your back spread your legs to the side as far apart as you can without causing discomfort.   Hamstring Strength:  Lying on your back, push your heel against the floor with your leg straight by tightening up the muscles of your buttocks.  Repeat, but this time bend your knee to a comfortable angle, and push your heel against the floor.  You may put a pillow under the heel to make it more comfortable if necessary.   A rehabilitation program following joint replacement surgery can speed recovery and prevent re-injury in the future due to weakened muscles. Contact your doctor or a physical therapist for more information on knee rehabilitation.    CONSTIPATION  Constipation is defined medically as fewer than three stools per week and severe constipation as less than one stool per week.  Even if you have a regular bowel pattern at home, your normal regimen is likely to be disrupted due to multiple reasons following surgery.  Combination of anesthesia, postoperative narcotics, change in appetite and fluid intake all can affect your bowels.   YOU MUST use at least one of the following options; they are listed in order of increasing strength  to get the job done.  They are all available over the counter, and you may need to use some, POSSIBLY even all of these options:    Drink plenty of fluids (prune juice may be helpful) and high fiber foods Colace 100 mg by mouth twice a day  Senokot for constipation as directed and as needed Dulcolax (bisacodyl), take with full glass of water  Miralax (polyethylene glycol) once or twice a day as needed.  If you have tried all these things and are unable to have a bowel movement in the first 3-4 days after surgery call either your surgeon or your primary doctor.    If you experience loose stools or diarrhea, hold the medications until you stool forms back up.  If your symptoms do not get better within 1 week or if they get  worse, check with your doctor.  If you experience "the worst abdominal pain ever" or develop nausea or vomiting, please contact the office immediately for further recommendations for treatment.   ITCHING:  If you experience itching with your medications, try taking only a single pain pill, or even half a pain pill at a time.  You can also use Benadryl over the counter for itching or also to help with sleep.   TED HOSE STOCKINGS:  Use stockings on both legs until for at least 2 weeks or as directed by physician office. They may be removed at night for sleeping.  MEDICATIONS:  See your medication summary on the After Visit Summary that nursing will review with you.  You may have some home medications which will be placed on hold until you complete the course of blood thinner medication.  It is important for you to complete the blood thinner medication as prescribed.  PRECAUTIONS:  If you experience chest pain or shortness of breath - call 911 immediately for transfer to the hospital emergency department.   If you develop a fever greater that 101 F, purulent drainage from wound, increased redness or drainage from wound, foul odor from the wound/dressing, or calf pain - CONTACT  YOUR SURGEON.                                                   FOLLOW-UP APPOINTMENTS:  If you do not already have a post-op appointment, please call the office for an appointment to be seen by your surgeon.  Guidelines for how soon to be seen are listed in your After Visit Summary, but are typically between 1-4 weeks after surgery.  OTHER INSTRUCTIONS:     MAKE SURE YOU:   Understand these instructions.   Get help right away if you are not doing well or get worse.    Thank you for letting us be a part of your medical care team.  It is a privilege we respect greatly.  We hope these instructions will help you stay on track for a fast and full recovery!

## 2018-11-13 NOTE — Interval H&P Note (Signed)
I participated in the care of this patient and agree with the above history, physical and evaluation. I performed a review of the history and a physical exam as detailed   Michalla Ringer Daniel Raizel Wesolowski MD  

## 2018-11-13 NOTE — Transfer of Care (Signed)
Immediate Anesthesia Transfer of Care Note  Patient: Lucas Stark  Procedure(s) Performed: UNICOMPARTMENTAL KNEE (Left Knee)  Patient Location: PACU  Anesthesia Type:Regional and Spinal  Level of Consciousness: sedated and drowsy  Airway & Oxygen Therapy: Patient connected to face mask oxygen  Post-op Assessment: Report given to RN and Post -op Vital signs reviewed and stable  Post vital signs: stable  Last Vitals:  Vitals Value Taken Time  BP 106/72 11/13/2018  1:00 PM  Temp    Pulse 57 11/13/2018  1:02 PM  Resp 12 11/13/2018  1:02 PM  SpO2 95 % 11/13/2018  1:02 PM  Vitals shown include unvalidated device data.  Last Pain:  Vitals:   11/13/18 0757  TempSrc: Oral         Complications: No apparent anesthesia complications

## 2018-11-13 NOTE — Telephone Encounter (Signed)
I called pt to discuss his sleep study results. No answer, left a message asking him to call me back. 

## 2018-11-13 NOTE — Op Note (Signed)
11/13/2018  11:00 AM  PATIENT:  Lucas Stark    PRE-OPERATIVE DIAGNOSIS:  OA LEFT KNEE  POST-OPERATIVE DIAGNOSIS:  Same  PROCEDURE:  UNICOMPARTMENTAL KNEE  SURGEON:  Renette Butters, MD  PHYSICIAN ASSISTANT: Roxan Hockey, PA-C, he was present and scrubbed throughout the case, critical for completion in a timely fashion, and for retraction, instrumentation, and closure.   ANESTHESIA:   General  PREOPERATIVE INDICATIONS:  Lucas Stark is a  63 y.o. male with a diagnosis of OA LEFT KNEE who failed conservative measures and elected for surgical management.    The risks benefits and alternatives were discussed with the patient preoperatively including but not limited to the risks of infection, bleeding, nerve injury, cardiopulmonary complications, blood clots, the need for revision surgery, among others, and the patient was willing to proceed.  OPERATIVE IMPLANTS: Biomet Oxford mobile bearing medial compartment arthroplasty. Femoral Component: med. Tibial tray: D, Size 4 poly.   OPERATIVE FINDINGS: Endstage grade 4 medial compartment osteoarthritis. No significant changes in the lateral or patellofemoral joint  OPERATIVE PROCEDURE: The patient was brought to the operating room placed in supine position. General anesthesia was administered. IV antibiotics were given. The lower extremity was placed in the legholder and prepped and draped in usual sterile fashion.  Time out was performed.  The leg was elevated and exsanguinated and the tourniquet was inflated. Anteromedial incision was performed, and I took care to preserve the MCL. Parapatellar incision was carried out, and the osteophytes were excised, along with the medial meniscus and a small portion of the fat pad.  The extra medullary tibial cutting jig was applied, using the spoon and the 43mm G-Clamp, and I took care to protect the anterior cruciate ligament insertion and the tibial spine. The medial collateral ligament was also  protected, and I resected my proximal tibia, matching the anatomic slope.   The proximal tibial bony cut was removed in one piece, and I turned my attention to the femur.  The intramedullary femoral rod was placed using the drill, and then using the appropriate reference, I assembled the femoral jig, setting my posterior cutting block. I resected my posterior femur, and then measured my gap.   I then used the mill to match the extension gap to the flexion gap. The gaps were then measured again with the appropriate feeler gauges. Once I had balanced flexion and extension gaps, I then completed the preparation of the femur.  I milled off the anterior aspect of the distal femur to prevent impingement. I also exposed the tibia, and selected the above-named component, and then used the cutting jig to prepare the keel slot on the tibia. I also used the awl to curette out the bone to complete the preparation of the keel. The back wall was intact.  I then placed trial components, and it was found to have excellent motion, and appropriate balance.  I then cemented the components into place, cementing the tibia first, removing all excess cement, and then cementing the femur.  All loose cement was removed.  The real polyethylene insert was applied manually, and the knee was taken through functional range of motion, and found to have excellent stability and restoration of joint motion, with excellent balance.  The wounds were irrigated copiously, and the parapatellar tissue closed with Vicryl, followed by Vicryl for the subcutaneous tissue, with routine closure with Steri-Strips and sterile gauze.  The tourniquet was released, and the patient was awakened and extubated and returned to PACU in  stable and satisfactory condition. There were no complications.  POSTOPERATIVE PLAN: DVT px will consist of SCD's, mobiliation and chemical px, WBAT     Renette Butters, MD

## 2018-11-13 NOTE — Evaluation (Signed)
Physical Therapy Evaluation Patient Details Name: Lucas Stark MRN: [id number] DOB: 01/07/1956 Today's Date: 11/13/2018   History of Present Illness  L UKR  Clinical Impression  Pt is s/p UKR resulting in the deficits listed below (see PT Problem List).  PT amb ~ 34' with RW and min/guard assist, anticipate steady progress with PT in acute setting  Pt will benefit from skilled PT to increase their independence and safety with mobility to allow discharge to the venue listed below.      Follow Up Recommendations Follow surgeon's recommendation for DC plan and follow-up therapies    Equipment Recommendations  Rolling walker with 5" wheels    Recommendations for Other Services       Precautions / Restrictions Precautions Precautions: Fall;Knee Precaution Comments: KI not used--IND SLRs  Required Braces or Orthoses: Knee Immobilizer - Left Restrictions Weight Bearing Restrictions: No Other Position/Activity Restrictions: WBAT      Mobility  Bed Mobility Overal bed mobility: Needs Assistance Bed Mobility: Supine to Sit     Supine to sit: Min guard     General bed mobility comments: for safety and lines  Transfers Overall transfer level: Needs assistance Equipment used: Rolling walker (2 wheeled) Transfers: Sit to/from Stand Sit to Stand: Min assist         General transfer comment: cues for hand placement, safety, to control descent  Ambulation/Gait Ambulation/Gait assistance: Min guard;Min assist Gait Distance (Feet): 55 Feet Assistive device: Rolling walker (2 wheeled) Gait Pattern/deviations: Step-to pattern;Step-through pattern;Decreased weight shift to left     General Gait Details: cues for sequence, Rw position  Stairs            Wheelchair Mobility    Modified Rankin (Stroke Patients Only)       Balance                                             Pertinent Vitals/Pain Pain Assessment: 0-10 Pain Score: 1  Pain  Location: left knee Pain Descriptors / Indicators: Aching;Sore Pain Intervention(s): Monitored during session;Limited activity within patient's tolerance    Home Living Family/patient expects to be discharged to:: Private residence Living Arrangements: Spouse/significant other Available Help at Discharge: Family Type of Home: House Home Access: Stairs to enter Entrance Stairs-Rails: Right;Left;Can reach both Technical brewer of Steps: 2 Home Layout: One level Home Equipment: None      Prior Function Level of Independence: Independent         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      Hand Dominance        Extremity/Trunk Assessment   Upper Extremity Assessment Upper Extremity Assessment: Overall WFL for tasks assessed    Lower Extremity Assessment Lower Extremity Assessment: LLE deficits/detail       Communication   Communication: No difficulties  Cognition Arousal/Alertness: Awake/alert Behavior During Therapy: WFL for tasks assessed/performed Overall Cognitive Status: Within Functional Limits for tasks assessed                                        General Comments      Exercises Total Joint Exercises Ankle Circles/Pumps: AROM;Both;10 reps Quad Sets: AROM;5 reps;Both Straight Leg Raises: AROM;Left;5 reps Goniometric ROM: grossly 5* to 70* L knee flexion, A/AROM   Assessment/Plan    PT  Assessment Patient needs continued PT services  PT Problem List Decreased strength;Decreased range of motion;Decreased activity tolerance;Decreased mobility;Decreased knowledge of use of DME;Pain;Decreased knowledge of precautions       PT Treatment Interventions Stair training;Gait training;DME instruction;Functional mobility training;Therapeutic activities;Therapeutic exercise;Patient/family education    PT Goals (Current goals can be found in the Care Plan section)  Acute Rehab PT Goals Patient Stated Goal: get back to work PT Goal Formulation: With  patient Time For Goal Achievement: 11/20/18 Potential to Achieve Goals: Good    Frequency 7X/week   Barriers to discharge        Co-evaluation               AM-PAC PT "6 Clicks" Mobility  Outcome Measure Help needed turning from your back to your side while in a flat bed without using bedrails?: A Little Help needed moving from lying on your back to sitting on the side of a flat bed without using bedrails?: A Little Help needed moving to and from a bed to a chair (including a wheelchair)?: A Little Help needed standing up from a chair using your arms (e.g., wheelchair or bedside chair)?: A Little Help needed to walk in hospital room?: A Little Help needed climbing 3-5 steps with a railing? : A Little 6 Click Score: 18    End of Session Equipment Utilized During Treatment: Gait belt Activity Tolerance: Patient tolerated treatment well Patient left: in bed;with bed alarm set;with call bell/phone within reach   PT Visit Diagnosis: Difficulty in walking, not elsewhere classified (R26.2)    Time: 5465-6812 PT Time Calculation (min) (ACUTE ONLY): 16 min   Charges:   PT Evaluation $PT Eval Low Complexity: 1 Low          Kenyon Ana, PT  Pager: [phone] Acute Rehab Dept [facility]): [phone]   11/13/2018   [facility] 11/13/2018, 6:23 PM

## 2018-11-13 NOTE — Anesthesia Postprocedure Evaluation (Signed)
Anesthesia Post Note  Patient: ZAYLYN BERGDOLL  Procedure(s) Performed: UNICOMPARTMENTAL KNEE (Left Knee)     Patient location during evaluation: Nursing Unit Anesthesia Type: Spinal Level of consciousness: oriented and awake and alert Pain management: pain level controlled Vital Signs Assessment: post-procedure vital signs reviewed and stable Respiratory status: spontaneous breathing and respiratory function stable Cardiovascular status: blood pressure returned to baseline and stable Postop Assessment: no headache, no backache, no apparent nausea or vomiting and patient able to bend at knees Anesthetic complications: no    Last Vitals:  Vitals:   11/13/18 1415 11/13/18 1430  BP: 127/87 123/86  Pulse: (!) 46 (!) 47  Resp: 17 14  Temp:  (!) 36.4 C  SpO2: 99% 100%    Last Pain:  Vitals:   11/13/18 1430  TempSrc:   PainSc: Asleep    LLE Motor Response: Purposeful movement (11/13/18 1430)   RLE Motor Response: Purposeful movement (11/13/18 1430)   L Sensory Level: S1-Sole of foot, small toes (11/13/18 1430) R Sensory Level: S1-Sole of foot, small toes (11/13/18 1430)  Barnet Glasgow

## 2018-11-13 NOTE — Anesthesia Procedure Notes (Signed)
Anesthesia Regional Block: Adductor canal block   Pre-Anesthetic Checklist: ,, timeout performed, Correct Patient, Correct Site, Correct Laterality, Correct Procedure, Correct Position, site marked, Risks and benefits discussed,  Surgical consent,  Pre-op evaluation,  At surgeon's request and post-op pain management  Laterality: Lower and Left  Prep: chloraprep       Needles:  Injection technique: Single-shot  Needle Type: Echogenic Needle     Needle Length: 9cm  Needle Gauge: 22     Additional Needles:   Procedures:,,,, ultrasound used (permanent image in chart),,,,  Narrative:  Start time: 11/13/2018 9:05 AM End time: 11/13/2018 9:11 AM Injection made incrementally with aspirations every 5 mL.  Performed by: Personally  Anesthesiologist: Barnet Glasgow, MD  Additional Notes: Block assessed prior to surgery. Pt tolerated procedure well.

## 2018-11-13 NOTE — Telephone Encounter (Signed)
-----   Message from Star Age, MD sent at 11/13/2018  7:50 AM EST ----- Patient referred by Dr. Lavonia Drafts, seen by me on 10/18/18, diagnostic PSG on 11/11/18.   Please call and notify the patient that the recent sleep study showed mild to moderate obstructive sleep apnea. I recommend treatment for this in the form of CPAP. This will require a repeat sleep study for proper titration and mask fitting and correct monitoring of the oxygen saturations. Please explain to patient. I have placed an order in the chart. Thanks.  Star Age, MD, PhD [facility])

## 2018-11-13 NOTE — Progress Notes (Signed)
AssistedDr. Houser with left, ultrasound guided, adductor canal block. Side rails up, monitors on throughout procedure. See vital signs in flow sheet. Tolerated Procedure well.  

## 2018-11-13 NOTE — Procedures (Signed)
PATIENT'S NAME:  Lucas Stark, Lucas Stark DOB:      [birth year]      MR#:    [id number]     DATE OF RECORDING: 11/11/2018 REFERRING M.D.:  Vassie Moment, MD Study Performed:   Baseline Polysomnogram HISTORY: 63 year old man with a history of hypertension, smoking, coronary artery disease with history of non-STEMI in 2017 and history of stent placement, ischemic cardiomyopathy, and overweight state, who reports difficulty waking up, sleep paralysis, and excessive daytime somnolence. The patient endorsed the Epworth Sleepiness Scale at 0/24 points. The patient's weight 177 pounds with a height of 65 (inches), resulting in a BMI of 29.4 kg/m2. The patient's neck circumference measured 15.8 inches.  CURRENT MEDICATIONS: Aspirin, Lipitor, Neurontin, Cozaar, Mobic, Nitrostat, Coreg.   PROCEDURE:  This is a multichannel digital polysomnogram utilizing the Somnostar 11.2 system.  Electrodes and sensors were applied and monitored per AASM Specifications.   EEG, EOG, Chin and Limb EMG, were sampled at 200 Hz.  ECG, Snore and Nasal Pressure, Thermal Airflow, Respiratory Effort, CPAP Flow and Pressure, Oximetry was sampled at 50 Hz. Digital video and audio were recorded.      BASELINE STUDY  Lights Out was at 20:43 and Lights On at 04:56.  Total recording time (TRT) was 493.5 minutes, with a total sleep time (TST) of 202 minutes.   The patient's sleep latency was 286 minutes, which is markedly delayed.  REM latency was 48 minutes, which is reduced. The sleep efficiency was 40.9%, which is markedly reduced.     SLEEP ARCHITECTURE: WASO (Wake after sleep onset) was 46.5 minutes. There were 8 minutes in Stage N1, 155 minutes Stage N2, 0 minutes Stage N3 and 39 minutes in Stage REM. The percentage of Stage N1 was 4.%, Stage N2 was 76.7%, which is increased, Stage N3 was absent and Stage R (REM sleep) was 19.3%, which normal. The arousals were noted as: 19 were spontaneous, 0 were associated with PLMs, 7 were associated with  respiratory events.  RESPIRATORY ANALYSIS:  There were a total of 27 respiratory events:  11 obstructive apneas, 3 central apneas and 0 mixed apneas with a total of 14 apneas and an apnea index (AI) of 4.2 /hour. There were 13 hypopneas with a hypopnea index of 3.9 /hour. The patient also had 0 respiratory event related arousals (RERAs).      The total APNEA/HYPOPNEA INDEX (AHI) was 8./hour and the total RESPIRATORY DISTURBANCE INDEX was 8. /hour.  12 events occurred in REM sleep and 17 events in NREM. The REM AHI was 18.5 /hour, versus a non-REM AHI of 5.5. The patient spent 105 minutes of total sleep time in the supine position and 97 minutes in non-supine.. The supine AHI was 10.3 versus a non-supine AHI of 5.5.  OXYGEN SATURATION & C02:  The Wake baseline 02 saturation was 94%, with the lowest being 83%. Time spent below 89% saturation equaled 163 minutes.  PERIODIC LIMB MOVEMENTS: The patient had a total of 0 Periodic Limb Movements.  The Periodic Limb Movement (PLM) index was 0 and the PLM Arousal index was 0/hour.  Audio and video analysis did not show any abnormal or unusual movements, behaviors, phonations or vocalizations. The patient took 1 bathroom break. Mild to moderate snoring was noted. The EKG was in keeping with normal sinus rhythm (NSR).  Post-study, the patient indicated that sleep was worse than usual.   IMPRESSION:  1. Obstructive Sleep Apnea (OSA) 2. Dysfunctions associated with sleep stages or arousal from sleep  RECOMMENDATIONS:  1. This study demonstrates overall mild obstructive sleep apnea, moderate during REM sleep with a total AHI of 8/hour, REM AHI of 18.5/hour, and O2 nadir of 83%. Given the patient's medical history and sleep related complaints, treatment with positive airway pressure is recommended; a full-night CPAP titration study is advised to optimize therapy. Other treatment options may include avoidance of supine sleep position along with weight loss,  upper airway or jaw surgery in selected patients or the use of an oral appliance in certain patients. ENT evaluation and/or consultation with a maxillofacial surgeon or dentist may be feasible in some instances.    2. This study shows sleep fragmentation, poor sleep efficiency, significant delay in sleep onset, and abnormal sleep stage percentages; these are nonspecific findings and per se do not signify an intrinsic sleep disorder or a cause for the patient's sleep-related symptoms. Causes include (but are not limited to) the first night effect of the sleep study, circadian rhythm disturbances, medication effect or an underlying mood disorder or medical problem.  3. The patient should be cautioned not to drive, work at heights, or operate dangerous or heavy equipment when tired or sleepy. Review and reiteration of good sleep hygiene measures should be pursued with any patient. 4. The patient will be seen in follow-up in the sleep clinic at [facility] for discussion of the test results, symptom and treatment compliance review, further management strategies, etc. The referring provider will be notified of the test results.  I certify that I have reviewed the entire raw data recording prior to the issuance of this report in accordance with the Standards of Accreditation of the American Academy of Sleep Medicine (AASM)     Star Age, MD, PhD Diplomat, American Board of Neurology and Sleep Medicine (Neurology and Sleep Medicine)

## 2018-11-14 ENCOUNTER — Encounter (HOSPITAL_COMMUNITY): Payer: Self-pay | Admitting: Orthopedic Surgery

## 2018-11-14 DIAGNOSIS — M1712 Unilateral primary osteoarthritis, left knee: Secondary | ICD-10-CM | POA: Diagnosis not present

## 2018-11-14 NOTE — Progress Notes (Signed)
Physical Therapy Treatment Patient Details Name: KHADEN GATER MRN: [id number] DOB: 1956/04/30 Today's Date: 11/14/2018    History of Present Illness L UKR    PT Comments    The patient attempted gait without RW and had balance loss. Cautioned patient to use RW until PT advances to [facility]. Ready for DC.   Follow Up Recommendations  Follow surgeon's recommendation for DC plan and follow-up therapies     Equipment Recommendations  Rolling walker with 5" wheels    Recommendations for Other Services       Precautions / Restrictions Precautions Precautions: Fall;Knee Precaution Comments: KI not used--IND SLRs  Restrictions LLE Weight Bearing: Weight bearing as tolerated    Mobility  Bed Mobility Overal bed mobility: Independent             General bed mobility comments: oob  Transfers   Equipment used: Rolling walker (2 wheeled) Transfers: Sit to/from Stand Sit to Stand: Supervision         General transfer comment: nt  Ambulation/Gait Ambulation/Gait assistance: Supervision Gait Distance (Feet): 50 Feet   Gait Pattern/deviations: Step-to pattern;Step-through pattern     General Gait Details: cues for sequence, and safety. Maintian use of RW at all times Rw position   Stairs Stairs: Yes Stairs assistance: Supervision Stair Management: Step to pattern;Forwards;Two rails Number of Stairs: 4 General stair comments: cues forn sequence, patient leading with surgical leg several times going up   Wheelchair Mobility    Modified Rankin (Stroke Patients Only)       Balance                                            Cognition Arousal/Alertness: Awake/alert Behavior During Therapy: WFL for tasks assessed/performed;Impulsive Overall Cognitive Status: Impaired/Different from baseline Area of Impairment: Safety/judgement                         Safety/Judgement: Decreased awareness of safety     General Comments: patient  demonstrated  ambulation without RW, PT encouraged patient to not try. Patient with noted balance  loss and instructed to NOT try this again. Patient concurred that he  needed Rw.      Exercises    General Comments        Pertinent Vitals/Pain Pain Score: 2  Pain Location: left knee Pain Descriptors / Indicators: Aching;Sore Pain Intervention(s): Monitored during session;Premedicated before session    Home Living                      Prior Function            PT Goals (current goals can now be found in the care plan section) Progress towards PT goals: Progressing toward goals    Frequency    7X/week      PT Plan Current plan remains appropriate    Co-evaluation              AM-PAC PT "6 Clicks" Mobility   Outcome Measure  Help needed turning from your back to your side while in a flat bed without using bedrails?: None Help needed moving from lying on your back to sitting on the side of a flat bed without using bedrails?: None Help needed moving to and from a bed to a chair (including a wheelchair)?: A Little Help needed standing up  from a chair using your arms (e.g., wheelchair or bedside chair)?: A Little Help needed to walk in hospital room?: A Little Help needed climbing 3-5 steps with a railing? : A Little 6 Click Score: 20    End of Session   Activity Tolerance: Patient tolerated treatment well Patient left: in chair;with call bell/phone within reach;with nursing/sitter in room Nurse Communication: Mobility status PT Visit Diagnosis: Unsteadiness on feet (R26.81)     Time: 1140-1150 PT Time Calculation (min) (ACUTE ONLY): 10 min  Charges:  $Gait Training: 8-22 mins $                   Tresa Endo PT Acute Rehabilitation Services Pager [phone] Office [phone]    Claretha Cooper 11/14/2018, 1:47 PM

## 2018-11-14 NOTE — Progress Notes (Signed)
Subjective: Patient reports pain as mild.  Tolerating diet.  Urinating.  No CP, SOB.  Mobilizing well.  Objective:   VITALS:   Vitals:   11/13/18 1700 11/13/18 2156 11/14/18 0104 11/14/18 0501  BP: (!) 143/86 134/77 (!) 144/89 (!) 145/92  Pulse: 63 71 62 70  Resp:  20 16 14   Temp: (!) 97.5 F (36.4 C) 98 F (36.7 C) 98 F (36.7 C) (!) 97.5 F (36.4 C)  TempSrc: Oral Oral Oral   SpO2: 97% 96% 96% 100%  Weight:      Height:       CBC Latest Ref Rng & Units 11/06/2018 03/23/2016 03/15/2016  WBC 4.0 - 10.5 K/uL 6.4 6.1 5.1  Hemoglobin 13.0 - 17.0 g/dL 15.8 15.9 15.3  Hematocrit 39.0 - 52.0 % 48.1 45.2 45.2  Platelets 150 - 400 K/uL 185 239 204   BMP Latest Ref Rng & Units 11/06/2018 10/19/2018 03/23/2016  Glucose 70 - 99 mg/dL 113(H) 98 99  BUN 8 - 23 mg/dL 11 11 13   Creatinine 0.61 - 1.24 mg/dL 1.04 1.05 1.03  BUN/Creat Ratio 10 - 24 - 10 -  Sodium 135 - 145 mmol/L 140 141 140  Potassium 3.5 - 5.1 mmol/L 3.8 4.2 4.2  Chloride 98 - 111 mmol/L 106 103 108  CO2 22 - 32 mmol/L 29 23 24   Calcium 8.9 - 10.3 mg/dL 8.8(L) 9.1 9.1   Intake/Output      03/03 0701 - 03/04 0700 03/04 0701 - 03/05 0700   P.O. 1760    I.V. (mL/kg) 2553 (31.3)    IV Piggyback 100    Total Intake(mL/kg) 4413 (54.1)    Urine (mL/kg/hr) 1490    Blood 50    Total Output 1540    Net +2873            Physical Exam: General: NAD.  Supine in bed.  Calm, conversant. Resp: No increased wob Cardio: regular rate and rhythm ABD soft Neurologically intact MSK LLE: Neurovascularly intact Sensation intact distally Feet warm Dorsiflexion/Plantar flexion intact Incision: dressing C/D/I   Assessment: 1 Day Post-Op  S/P Procedure(s) (LRB): UNICOMPARTMENTAL KNEE (Left) by Dr. Ernesta Amble. Percell Miller on 11/13/2018  Principal Problem:   Primary osteoarthritis of left knee Active Problems:   History of non-ST elevation myocardial infarction (NSTEMI)   Benign essential HTN   Tobacco abuse   Dyslipidemia,  goal LDL below 70   CAD S/P percutaneous coronary angioplasty   Cardiomyopathy, ischemic   Primary localized osteoarthritis of knee   Primary osteoarthritis, status post left unicompartmental knee arthroplasty Well postop day 1 Eating, drinking, and voiding Pain minimal Mobilizing well Desires discharge ASAP this morning  Plan: Up with therapy Incentive Spirometry Elevate and Apply ice CPM, bone foam  Weight Bearing: Weight Bearing as Tolerated (WBAT) LLE Dressings: Maintain Mepilex.   VTE prophylaxis: Aspirin, SCDs, ambulation Dispo: Home this morning   Patient's anticipated LOS is less than 2 midnights, meeting these requirements: - Younger than 69 - Lives within 1 hour of care - Has a competent adult at home to recover with post-op recover - NO history of             - Chronic pain requiring opiods             - Diabetes             - Coronary Artery Disease             - Heart failure             -  Stroke             - DVT/VTE             - Cardiac arrhythmia             - Respiratory Failure/COPD             - Renal failure             - Anemia             - Advanced Liver disease   Prudencio Burly III, PA-C 11/14/2018, 8:04 AM

## 2018-11-14 NOTE — Progress Notes (Signed)
Physical Therapy Treatment Patient Details Name: Lucas Stark MRN: [id number] DOB: Sep 27, 1955 Today's Date: 11/14/2018    History of Present Illness L UKR    PT Comments    The patient is progressing well. Plans Dc after [prqactice steps   Follow Up Recommendations  Follow surgeon's recommendation for DC plan and follow-up therapies     Equipment Recommendations  Rolling walker with 5" wheels    Recommendations for Other Services       Precautions / Restrictions Precautions Precautions: Fall;Knee Precaution Comments: KI not used--IND SLRs  Restrictions LLE Weight Bearing: Weight bearing as tolerated    Mobility  Bed Mobility Overal bed mobility: Independent                Transfers                 General transfer comment: nt  Ambulation/Gait                 Stairs             Wheelchair Mobility    Modified Rankin (Stroke Patients Only)       Balance                                            Cognition Arousal/Alertness: Awake/alert                                            Exercises Total Joint Exercises Ankle Circles/Pumps: AROM;Both;10 reps Quad Sets: AROM;5 reps;Both Short Arc Quad: AROM;Left;10 reps Heel Slides: AROM;10 reps Hip ABduction/ADduction: AROM;Left;10 reps Straight Leg Raises: AROM;Left;10 reps Goniometric ROM: 5-70 left knee flex    General Comments        Pertinent Vitals/Pain Pain Score: 2  Pain Location: left knee Pain Descriptors / Indicators: Aching;Sore Pain Intervention(s): Monitored during session;Premedicated before session    Home Living                      Prior Function            PT Goals (current goals can now be found in the care plan section) Progress towards PT goals: Progressing toward goals    Frequency    7X/week      PT Plan Current plan remains appropriate    Co-evaluation              AM-PAC PT "6  Clicks" Mobility   Outcome Measure  Help needed turning from your back to your side while in a flat bed without using bedrails?: None Help needed moving from lying on your back to sitting on the side of a flat bed without using bedrails?: None Help needed moving to and from a bed to a chair (including a wheelchair)?: A Little Help needed standing up from a chair using your arms (e.g., wheelchair or bedside chair)?: A Little Help needed to walk in hospital room?: A Little Help needed climbing 3-5 steps with a railing? : A Little 6 Click Score: 20    End of Session   Activity Tolerance: Patient tolerated treatment well Patient left: in bed;with call bell/phone within reach;with family/visitor present Nurse Communication: Mobility status PT Visit Diagnosis: Unsteadiness on feet (R26.81)     Time:  5525-8948 PT Time Calculation (min) (ACUTE ONLY): 13 min  Charges:  $Therapeutic Exercise: 8-22 mins                     Tresa Endo PT Acute Rehabilitation Services Pager [phone] Office [phone]    Claretha Cooper 11/14/2018, 1:41 PM

## 2018-11-14 NOTE — Evaluation (Signed)
Occupational Therapy Evaluation Patient Details Name: Lucas Stark MRN: [id number] DOB: 08/15/1956 Today's Date: 11/14/2018    History of Present Illness L UKR   Clinical Impression   This 63 year old man was admitted for the above sx. All education was completed. No further OT is needed at this time     Follow Up Recommendations  Supervision/Assistance - 24 hour    Equipment Recommendations  None recommended by OT    Recommendations for Other Services       Precautions / Restrictions Precautions Precautions: Fall;Knee Precaution Comments: KI not used--IND SLRs  Required Braces or Orthoses: Knee Immobilizer - Left Restrictions Weight Bearing Restrictions: Yes LLE Weight Bearing: Weight bearing as tolerated      Mobility Bed Mobility         Supine to sit: Supervision     General bed mobility comments: HOB raised  Transfers   Equipment used: Rolling walker (2 wheeled)   Sit to Stand: Min guard         General transfer comment: for safety    Balance                                           ADL either performed or assessed with clinical judgement   ADL Overall ADL's : Needs assistance/impaired Eating/Feeding: Independent   Grooming: Supervision/safety;Standing   Upper Body Bathing: Set up   Lower Body Bathing: Minimal assistance   Upper Body Dressing : Set up   Lower Body Dressing: Moderate assistance;Sit to/from stand   Toilet Transfer: Supervision/safety;Ambulation;RW;Comfort height toilet   Toileting- Clothing Manipulation and Hygiene: Supervision/safety;Sit to/from stand         General ADL Comments: assisted pt with clothing. Wife will assist as needed. He was able to get up from comfort height commode without difficulty, with only toilet seat and RW. Pt has a standard commode at home. Reviewed knee precautions     Vision         Perception     Praxis      Pertinent Vitals/Pain Pain Score: 1  Pain  Location: left knee Pain Descriptors / Indicators: Aching;Sore Pain Intervention(s): Limited activity within patient's tolerance;Monitored during session;Premedicated before session;Repositioned;Ice applied     Hand Dominance     Extremity/Trunk Assessment Upper Extremity Assessment Upper Extremity Assessment: Overall WFL for tasks assessed           Communication Communication Communication: No difficulties   Cognition Arousal/Alertness: Awake/alert Behavior During Therapy: WFL for tasks assessed/performed Overall Cognitive Status: Within Functional Limits for tasks assessed                                     General Comments       Exercises     Shoulder Instructions      Home Living Family/patient expects to be discharged to:: Private residence Living Arrangements: Spouse/significant other     Home Access: Stairs to enter           Bathroom Shower/Tub: Teacher, early years/pre: Standard     Home Equipment: None   Additional Comments: pt has a vanity next to commode; will sponge bathe initially      Prior Functioning/Environment Level of Independence: Independent        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  OT Problem List:        OT Treatment/Interventions:      OT Goals(Current goals can be found in the care plan section) Acute Rehab OT Goals Patient Stated Goal: get back to work OT Goal Formulation: All assessment and education complete, DC therapy  OT Frequency:     Barriers to D/C:            Co-evaluation              AM-PAC OT "6 Clicks" Daily Activity     Outcome Measure Help from another person eating meals?: None Help from another person taking care of personal grooming?: A Little Help from another person toileting, which includes using toliet, bedpan, or urinal?: A Little Help from another person bathing (including washing, rinsing, drying)?: A Little Help from another person to put on and taking  off regular upper body clothing?: A Little Help from another person to put on and taking off regular lower body clothing?: A Lot 6 Click Score: 18   End of Session    Activity Tolerance: Patient tolerated treatment well Patient left: in chair;with call bell/phone within reach;with family/visitor present  OT Visit Diagnosis: Pain Pain - Right/Left: Left Pain - part of body: Knee                Time: 1941-7408 OT Time Calculation (min): 14 min Charges:  OT General Charges $OT Visit: 1 Visit OT Evaluation $OT Eval Low Complexity: [place], OTR/L Acute Rehabilitation Services [phone] [facility] pager [phone] office 11/14/2018  [place] 11/14/2018, 9:56 AM

## 2018-11-14 NOTE — Telephone Encounter (Signed)
I called Lucas Stark. I advised Lucas Stark that Dr. Rexene Alberts reviewed their sleep study results and found that Lucas Stark has mild to moderate osa and recommends that Lucas Stark be treated with a cpap. Dr. Rexene Alberts recommends that Lucas Stark return for a repeat sleep study in order to properly titrate the cpap and ensure a good mask fit. Lucas Stark is agreeable to returning for a titration study. I advised Lucas Stark that our sleep lab will file with Lucas Stark's insurance and call Lucas Stark to schedule the sleep study when we hear back from the Lucas Stark's insurance regarding coverage of this sleep study. Lucas Stark verbalized understanding of results. Lucas Stark had no questions at this time but was encouraged to call back if questions arise.

## 2018-11-14 NOTE — Discharge Summary (Signed)
Discharge Summary  Patient ID: Lucas Stark MRN: [id number] DOB/AGE: January 04, 1956 63 y.o.  Admit date: 11/13/2018 Discharge date: 11/14/2018  Admission Diagnoses:  Primary osteoarthritis of left knee  Discharge Diagnoses:  Principal Problem:   Primary osteoarthritis of left knee Active Problems:   History of non-ST elevation myocardial infarction (NSTEMI)   Benign essential HTN   Tobacco abuse   Dyslipidemia, goal LDL below 70   CAD S/P percutaneous coronary angioplasty   Cardiomyopathy, ischemic   Primary localized osteoarthritis of knee   Past Medical History:  Diagnosis Date  . CAD (coronary artery disease) cardiologist-- dr Stanford Breed   a. 03/2016 NSTEMI/PCI: LM nl, LAD nl, RI nl, LCX nl, OM2 99 ( 2.75 x 16 Synergy DES), RCA nl, EF 45-50%.  Marland Kitchen ETOH abuse   . History of cocaine abuse ([place])    per pt none since 2017  . History of non-ST elevation myocardial infarction (NSTEMI) 03/14/2016   s/p  PCI and DES x1  . Hyperlipidemia   . Hypertensive heart disease   . Ischemic cardiomyopathy    a. 03/2016 Echo: EF 35-40%, diff H, sev inflat HK, Gr2 DD;  b. 03/2016 EF 45-50% by LV gram.;  echo 10-24-2018, ef 45-50%  . OA (osteoarthritis)    left knee  . S/P drug eluting coronary stent placement 03/14/2016   DES x1 to OM1  . Tobacco abuse   . Wears dentures    upper  . Wears partial dentures    lower    Surgeries: Procedure(s): UNICOMPARTMENTAL KNEE on 11/13/2018   Consultants (if any):   Discharged Condition: Improved  Hospital Course: Lucas Stark is an 63 y.o. male who was admitted 11/13/2018 with a diagnosis of Primary osteoarthritis of left knee and went to the operating room on 11/13/2018 and underwent the above named procedures.    He was given perioperative antibiotics:  Anti-infectives (From admission, onward)   Start     Dose/Rate Route Frequency Ordered Stop   11/13/18 1700  ceFAZolin (ANCEF) IVPB 2g/100 mL premix     2 g 200 mL/hr over 30 Minutes Intravenous Every  6 hours 11/13/18 1445 11/13/18 2230   11/13/18 0800  ceFAZolin (ANCEF) IVPB 2g/100 mL premix     2 g 200 mL/hr over 30 Minutes Intravenous On call to O.R. 11/13/18 0747 11/13/18 1057    .  He was given sequential compression devices, early ambulation, and aspirin for DVT prophylaxis.  He benefited maximally from the hospital stay and there were no complications.    Recent vital signs:  Vitals:   11/14/18 0104 11/14/18 0501  BP: (!) 144/89 (!) 145/92  Pulse: 62 70  Resp: 16 14  Temp: 98 F (36.7 C) (!) 97.5 F (36.4 C)  SpO2: 96% 100%    Recent laboratory studies:  Lab Results  Component Value Date   HGB 15.8 11/06/2018   HGB 15.9 03/23/2016   HGB 15.3 03/15/2016   Lab Results  Component Value Date   WBC 6.4 11/06/2018   PLT 185 11/06/2018   Lab Results  Component Value Date   INR 1.07 03/14/2016   Lab Results  Component Value Date   NA 140 11/06/2018   K 3.8 11/06/2018   CL 106 11/06/2018   CO2 29 11/06/2018   BUN 11 11/06/2018   CREATININE 1.04 11/06/2018   GLUCOSE 113 (H) 11/06/2018    Discharge Medications:   Allergies as of 11/14/2018      Reactions   Other Itching  Prescription Sleeping pills, Unsure of the name of the sleeping pill      Medication List    STOP taking these medications   meloxicam 7.5 MG tablet Commonly known as:  MOBIC     TAKE these medications   acetaminophen 500 MG tablet Commonly known as:  TYLENOL Take 2 tablets (1,000 mg total) by mouth every 8 (eight) hours for 10 days. For Pain.   aspirin EC 81 MG tablet Take 1 tablet (81 mg total) by mouth 2 (two) times daily. For DVT prophylaxis for 30 days after surgery. What changed:    when to take this  additional instructions   atorvastatin 80 MG tablet Commonly known as:  LIPITOR TAKE 1 TABLET BY MOUTH EVERY EVENING AT 6 pm What changed:  See the new instructions.   carvedilol 6.25 MG tablet Commonly known as:  COREG Take 1 tablet (6.25 mg total) by mouth 2 (two)  times daily.   D3 SUPER STRENGTH 50 MCG (2000 UT) Caps Generic drug:  Cholecalciferol Take 2,000 Units by mouth daily.   docusate sodium 100 MG capsule Commonly known as:  COLACE Take 1 capsule (100 mg total) by mouth 2 (two) times daily. To prevent constipation while taking pain medication.   gabapentin 300 MG capsule Commonly known as:  NEURONTIN Take 1 capsule (300 mg total) by mouth 3 (three) times daily for 14 days. For 2 weeks post op for pain.   losartan 50 MG tablet Commonly known as:  COZAAR Take 1 tablet (50 mg total) by mouth daily.   methocarbamol 500 MG tablet Commonly known as:  ROBAXIN Take 1 tablet (500 mg total) by mouth every 8 (eight) hours as needed for muscle spasms.   nitroGLYCERIN 0.4 MG SL tablet Commonly known as:  NITROSTAT Place 1 tablet (0.4 mg total) under the tongue every 5 (five) minutes as needed for chest pain.   ondansetron 4 MG tablet Commonly known as:  ZOFRAN Take 1 tablet (4 mg total) by mouth every 8 (eight) hours as needed for nausea or vomiting.   oxyCODONE 5 MG immediate release tablet Commonly known as:  ROXICODONE Take 1 tablet (5 mg total) by mouth every 4 (four) hours as needed for up to 30 days for breakthrough pain.       Diagnostic Studies: Dg Knee Left Port  Result Date: 11/13/2018 CLINICAL DATA:  Postop knee arthroplasty EXAM: PORTABLE LEFT KNEE - 1-2 VIEW COMPARISON:  08/16/2018 FINDINGS: Hemiarthroplasty in the medial joint compartment. Prosthesis in good position. Fluid and gas in the knee joint. No fracture or complication. IMPRESSION: Satisfactory hemiarthroplasty left knee medial compartment Electronically Signed   By: Franchot Gallo M.D.   On: 11/13/2018 17:41    Disposition: Discharge disposition: 01-Home or Self Care       Discharge Instructions    Discharge patient   Complete by:  As directed    Discharge disposition:  01-Home or Self Care   Discharge patient date:  11/14/2018      Follow-up Information     Renette Butters, MD.   Specialty:  Orthopedic Surgery Contact information: [address] [place] [address] [phone]            Signed: Prudencio Burly III PA-C 11/14/2018, 8:08 AM

## 2018-11-16 ENCOUNTER — Other Ambulatory Visit: Payer: Self-pay

## 2018-11-16 ENCOUNTER — Encounter: Payer: Self-pay | Admitting: Physical Therapy

## 2018-11-16 ENCOUNTER — Ambulatory Visit: Payer: Medicaid Other | Attending: Orthopedic Surgery | Admitting: Physical Therapy

## 2018-11-16 DIAGNOSIS — M25662 Stiffness of left knee, not elsewhere classified: Secondary | ICD-10-CM | POA: Diagnosis present

## 2018-11-16 DIAGNOSIS — M25562 Pain in left knee: Secondary | ICD-10-CM | POA: Insufficient documentation

## 2018-11-16 DIAGNOSIS — G8929 Other chronic pain: Secondary | ICD-10-CM | POA: Diagnosis present

## 2018-11-16 DIAGNOSIS — R2689 Other abnormalities of gait and mobility: Secondary | ICD-10-CM | POA: Insufficient documentation

## 2018-11-16 DIAGNOSIS — R600 Localized edema: Secondary | ICD-10-CM | POA: Insufficient documentation

## 2018-11-16 NOTE — Therapy (Signed)
[place], [place], [address] Phone: [phone]   Fax:  [phone]  Physical Therapy Evaluation  Patient Details  Name: Lucas Stark MRN: [id number] Date of Birth: [birth year] Referring Provider (PT): Dr Lawerance Sabal    Encounter Date: 11/16/2018  PT End of Session - 11/16/18 1006    Visit Number  1    Number of Visits  4    Date for PT Re-Evaluation  12/14/18    Authorization Type  [employer]     PT Start Time  0930    PT Stop Time  1005    PT Time Calculation (min)  35 min    Activity Tolerance  Patient tolerated treatment well    Behavior During Therapy  Impulsive;WFL for tasks assessed/performed       Past Medical History:  Diagnosis Date  . CAD (coronary artery disease) cardiologist-- dr Stanford Breed   a. 03/2016 NSTEMI/PCI: LM nl, LAD nl, RI nl, LCX nl, OM2 99 ( 2.75 x 16 Synergy DES), RCA nl, EF 45-50%.  Marland Kitchen ETOH abuse   . History of cocaine abuse ([place])    per pt none since 2017  . History of non-ST elevation myocardial infarction (NSTEMI) 03/14/2016   s/p  PCI and DES x1  . Hyperlipidemia   . Hypertensive heart disease   . Ischemic cardiomyopathy    a. 03/2016 Echo: EF 35-40%, diff H, sev inflat HK, Gr2 DD;  b. 03/2016 EF 45-50% by LV gram.;  echo 10-24-2018, ef 45-50%  . OA (osteoarthritis)    left knee  . S/P drug eluting coronary stent placement 03/14/2016   DES x1 to OM1  . Tobacco abuse   . Wears dentures    upper  . Wears partial dentures    lower    Past Surgical History:  Procedure Laterality Date  . CARDIAC CATHETERIZATION N/A 03/14/2016   Procedure: Left Heart Cath and Coronary Angiography;  Surgeon: Burnell Blanks, MD;  Location: [facility];  Service: Cardiovascular;  Laterality: N/A;  . LACERATION REPAIR  age 23   face around eye  . PARTIAL KNEE ARTHROPLASTY Left 11/13/2018   Procedure: UNICOMPARTMENTAL KNEE;  Surgeon: Renette Butters, MD;  Location: [facility];   Service: Orthopedics;  Laterality: Left;    There were no vitals filed for this visit.   Subjective Assessment - 11/16/18 0929    Subjective  Patient had a Left Unicompartmental Knee replacement on 11/13/2018. He has been in significant pain since that point. He is having pain in the back of his knee. He is having most of his pain at night time. He has difficulty lying back. He is also having pain walking. He was not using an assitvie decvice prior to his surgery.     Limitations  Standing;Walking    How long can you sit comfortably?  Knee stiffens quickly when he sits    How long can you stand comfortably?  Has pain right away when standing     How long can you walk comfortably?  limited distances. Had pain walking from the parking lot to the lobby.     Diagnostic tests  Nothing post op     Patient Stated Goals  Less pain     Currently in Pain?  Yes    Pain Score  9     Pain Location  Knee    Pain Orientation  Left    Pain Descriptors / Indicators  Aching  Pain Type  Chronic pain    Pain Radiating Towards  pain into the quad     Pain Onset  More than a month ago    Pain Frequency  Constant    Aggravating Factors   standing, walking , night time     Pain Relieving Factors  rest     Effect of Pain on Daily Activities  difficulty walking and sleeping          [facility] PT Assessment - 11/16/18 0001      Assessment   Medical Diagnosis  Left unicompartmetal knee replacement     Referring Provider (PT)  Dr Lawerance Sabal     Onset Date/Surgical Date  11/13/18    Hand Dominance  Right    Next MD Visit  11/21/2018     Prior Therapy  None       Precautions   Precautions  None      Restrictions   Weight Bearing Restrictions  No      Balance Screen   Has the patient fallen in the past 6 months  No    Has the patient had a decrease in activity level because of a fear of falling?   No    Is the patient reluctant to leave their home because of a fear of falling?   No      Home  Environment   Additional Comments  6 teps in the back and 2 in the front.  Has a tub shower at home. Dosent have a commode.       Prior Function   Level of Independence  Independent    Vocation  Unemployed    Leisure  Nothing       Cognition   Overall Cognitive Status  Within Functional Limits for tasks assessed    Attention  Focused    Focused Attention  Appears intact    Memory  Appears intact    Awareness  Appears intact    Problem Solving  Appears intact      Observation/Other Assessments   Observations  Had to stand and walk during treatment wnd to pain     Skin Integrity  wound is dressed       Sensation   Light Touch  Appears Intact      Coordination   Gross Motor Movements are Fluid and Coordinated  Yes    Fine Motor Movements are Fluid and Coordinated  Yes      ROM / Strength   AROM / PROM / Strength  AROM;PROM;Strength      AROM   Overall AROM Comments  limited active hip abdcution and flexion     AROM Assessment Site  Knee    Right/Left Knee  Left    Left Knee Extension  -35    Left Knee Flexion  60      PROM   PROM Assessment Site  Knee    Right/Left Knee  Left    Left Knee Extension  -30    Left Knee Flexion  80      Strength   Strength Assessment Site  Knee;Hip    Right/Left Hip  Left    Left Hip Flexion  2/5    Left Hip ABduction  2/5    Right/Left Knee  Left    Left Knee Flexion  3/5    Left Knee Extension  2/5      Palpation   Palpation comment  unable to palpate 2nd to significant pain.  No pain in calf though       Transfers   Comments  slow transfer; required both hands       Ambulation/Gait   Gait Comments  limited weight bearing on the left knee. Abulates with the walker.                 Objective measurements completed on examination: See above findings.      [facility] PT Treatment/Exercise - 11/16/18 0001      Exercises   Exercises  --   attmepted to show patient stretches but patient unable     Modalities    Modalities  --   sggested ice but patient declined             PT Education - 11/16/18 0941    Education Details  Reviewed HEP; RICe for edema mangement     Person(s) Educated  Patient    Methods  Explanation;Demonstration;Tactile cues    Comprehension  Verbal cues required;Need further instruction;Tactile cues required       PT Short Term Goals - 11/16/18 1204      PT SHORT TERM GOAL #1   Title  Patient will increase knee flexion by 20 degrees     Baseline  80 degrees with pain     Time  3    Period  Weeks    Status  New    Target Date  12/07/18      PT SHORT TERM GOAL #2   Title  Patient will improve knee extension motion by 10 degrees     Baseline  -30     Time  3    Period  Weeks    Status  New    Target Date  12/07/18      PT SHORT TERM GOAL #3   Title  Patient will increase gross knee and hip strength to 4/5     Baseline  lef hip flexion and abduction 2/5; knee extension 2/5     Time  3    Period  Weeks    Status  New    Target Date  12/07/18                Plan - 11/16/18 1152    Clinical Impression Statement  Patient is a 63 year old male S/P left knee replacement on 11/13/2018. His examination today was extremly limited by pain. He could nbot maintain a supine position and had difficulty sitting for standing for long. His motion was measured at 30-80 total arc. He has limited weight bearing with ambulation. He was given knee flexion and extension strengthening for home. The program therapy uses was not working and the patient declined to wait for HEP. He was also strongly encouraraged to make appointments at the front but did not. We will put in his Medicaid submission and do outr best to get him scheduled,     Personal Factors and Comorbidities  Comorbidity 1;Finances    Comorbidities  OA    Examination-Activity Limitations  Bed Mobility;Squat;Stairs;Stand;Sit;Locomotion Level    Examination-Participation Restrictions  Church;Shop;Community  Activity    Stability/Clinical Decision Making  Evolving/Moderate complexity   significant pain    Clinical Decision Making  Moderate    Rehab Potential  Good    PT Frequency  1x / week    PT Duration  3 weeks    PT Treatment/Interventions  ADLs/Self Care Home Management;Cryotherapy;Electrical Stimulation;Ultrasound;DME Instruction;Gait training;Functional mobility Scientist, forensic;Therapeutic activities;Therapeutic exercise;Neuromuscular re-education;Patient/family education;Manual techniques;Passive range  of motion;Taping    PT Next Visit Plan  needs HEP; tried to review and give patient HEP but patient was in too much pain; work on knee flexion and extnesion; edema mangement; quad set, SAQ; SLR; standing exercises     PT Home Exercise Plan  tried to review knee extension and flexion stretching but patient was in too much pain     Consulted and Agree with Plan of Care  Patient       Patient will benefit from skilled therapeutic intervention in order to improve the following deficits and impairments:  Abnormal gait, Pain, Postural dysfunction, Decreased activity tolerance, Decreased endurance, Decreased range of motion, Decreased strength, Increased edema  Visit Diagnosis: Chronic pain of left knee  Stiffness of left knee, not elsewhere classified  Other abnormalities of gait and mobility  Localized edema     Problem List Patient Active Problem List   Diagnosis Date Noted  . Primary localized osteoarthritis of knee 11/13/2018  . Primary osteoarthritis of left knee 10/24/2018  . Knee effusion, left 08/15/2018  . Hypertensive heart disease without CHF 03/15/2016  . Dyslipidemia, goal LDL below 70 03/15/2016  . CAD S/P percutaneous coronary angioplasty 03/15/2016  . Cardiomyopathy, ischemic 03/15/2016  . Status post coronary artery stent placement   . History of non-ST elevation myocardial infarction (NSTEMI) 03/12/2016  . Benign essential HTN 03/12/2016  . Tobacco abuse  03/12/2016  . Cocaine use 03/12/2016  . Alcohol abuse 03/12/2016    Carney Living PT DPT  11/16/2018, 12:12 PM  [facility] [address] [place], [place], [address] Phone: [phone]   Fax:  [phone]  Name: Lucas Stark MRN: [id number] Date of Birth: 08-15-1956

## 2018-11-16 NOTE — Patient Instructions (Signed)
[employer] down. Patient unable to wait for exercises

## 2018-11-21 ENCOUNTER — Institutional Professional Consult (permissible substitution): Payer: Self-pay | Admitting: Neurology

## 2018-11-28 ENCOUNTER — Ambulatory Visit: Payer: Medicaid Other | Admitting: Physical Therapy

## 2018-11-28 ENCOUNTER — Encounter: Payer: Self-pay | Admitting: Physical Therapy

## 2018-11-28 ENCOUNTER — Other Ambulatory Visit: Payer: Self-pay

## 2018-11-28 DIAGNOSIS — M25562 Pain in left knee: Principal | ICD-10-CM

## 2018-11-28 DIAGNOSIS — R2689 Other abnormalities of gait and mobility: Secondary | ICD-10-CM

## 2018-11-28 DIAGNOSIS — G8929 Other chronic pain: Secondary | ICD-10-CM

## 2018-11-28 DIAGNOSIS — M25662 Stiffness of left knee, not elsewhere classified: Secondary | ICD-10-CM

## 2018-11-28 DIAGNOSIS — R600 Localized edema: Secondary | ICD-10-CM

## 2018-11-28 NOTE — Therapy (Addendum)
[place], [place], [address] Phone: [phone]   Fax:  [phone]  Physical Therapy Treatment/Discharge   Patient Details  Name: Lucas Stark MRN: [id number] Date of Birth: Jun 05, 1956 Referring Provider (PT): Dr Lawerance Sabal    Encounter Date: 11/28/2018  PT End of Session - 11/28/18 1326    Visit Number  2    Number of Visits  4    Date for PT Re-Evaluation  12/14/18    Authorization Type  [employer]     PT Start Time  1320    PT Stop Time  1359    PT Time Calculation (min)  39 min    Equipment Utilized During Treatment  Gait belt    Activity Tolerance  Patient tolerated treatment well    Behavior During Therapy  Impulsive;WFL for tasks assessed/performed       Past Medical History:  Diagnosis Date  . CAD (coronary artery disease) cardiologist-- dr Stanford Breed   a. 03/2016 NSTEMI/PCI: LM nl, LAD nl, RI nl, LCX nl, OM2 99 ( 2.75 x 16 Synergy DES), RCA nl, EF 45-50%.  Marland Kitchen ETOH abuse   . History of cocaine abuse ([place])    per pt none since 2017  . History of non-ST elevation myocardial infarction (NSTEMI) 03/14/2016   s/p  PCI and DES x1  . Hyperlipidemia   . Hypertensive heart disease   . Ischemic cardiomyopathy    a. 03/2016 Echo: EF 35-40%, diff H, sev inflat HK, Gr2 DD;  b. 03/2016 EF 45-50% by LV gram.;  echo 10-24-2018, ef 45-50%  . OA (osteoarthritis)    left knee  . S/P drug eluting coronary stent placement 03/14/2016   DES x1 to OM1  . Tobacco abuse   . Wears dentures    upper  . Wears partial dentures    lower    Past Surgical History:  Procedure Laterality Date  . CARDIAC CATHETERIZATION N/A 03/14/2016   Procedure: Left Heart Cath and Coronary Angiography;  Surgeon: Burnell Blanks, MD;  Location: [facility];  Service: Cardiovascular;  Laterality: N/A;  . LACERATION REPAIR  age 63   face around eye  . PARTIAL KNEE ARTHROPLASTY Left 11/13/2018   Procedure: UNICOMPARTMENTAL  KNEE;  Surgeon: Renette Butters, MD;  Location: [facility];  Service: Orthopedics;  Laterality: Left;    There were no vitals filed for this visit.  Subjective Assessment - 11/28/18 1324    Subjective  Pt reports 6/10 pain today without pain medicine. Pt saw Dr Percell Miller this AM and had steristrips removed.  He reports he has been "using the strap" for his exercises and demonstrated AAROM knee extension seated.     Limitations  Standing;Walking    How long can you sit comfortably?  Knee stiffens quickly when he sits    How long can you stand comfortably?  Has pain right away when standing     How long can you walk comfortably?  limited distances. Had pain walking from the parking lot to the lobby.     Diagnostic tests  Nothing post op     Patient Stated Goals  Less pain     Currently in Pain?  Yes    Pain Score  6     Pain Location  Knee    Pain Orientation  Left    Pain Descriptors / Indicators  Aching    Pain Type  Surgical pain    Pain Onset  More than  a month ago    Multiple Pain Sites  No         [facility] PT Assessment - 11/28/18 0001      AROM   AROM Assessment Site  Knee    Right/Left Knee  Left    Left Knee Extension  -15    Left Knee Flexion  107      Strength   Left Hip Flexion  3/5    Left Knee Extension  3/5                   [facility] Adult PT Treatment/Exercise - 11/28/18 0001      Ambulation/Gait   Ambulation/Gait  Yes    Ambulation/Gait Assistance  5: Supervision    Ambulation/Gait Assistance Details  Verbal cues with heel strike, toe off, and knee flexion    Ambulation Distance (Feet)  200 Feet   feet   Assistive device  Straight cane   and without SPC in parallel bars. Verbal cues to look up.    Gait Pattern  Step-through pattern    Ambulation Surface  Level      Exercises   Exercises  Knee/Hip      Knee/Hip Exercises: Seated   Long Arc Quad  10 reps;2 sets   HEP   Heel Slides  2 sets;10 reps   HEP   Heel Slides Limitations  with towel  under foot    Marching  10 reps;2 sets   HEP     Knee/Hip Exercises: Supine   Quad Sets  3 sets;10 reps   HEP   Straight Leg Raises  2 sets;10 reps   HEP   Knee Flexion  --    Knee Flexion Limitations  --             PT Education - 11/28/18 1403    Education Details  PT instructed pt in HEP including LAQ, seated Marches, heel slides seated, quad set, SLR with written program given and teach back technique. Instructed pt in importance of positioning knee into full extension when possible to return to terminal extension with positioning techniques with towel and recommended to tolerance. Instructed pt in gait training with heel strike, knee flexion, and continued use of SPC to precent compensation. Discussed positioning knee into extension.      Person(s) Educated  Patient    Methods  Explanation;Demonstration;Tactile cues;Verbal cues       PT Short Term Goals - 11/16/18 1204      PT SHORT TERM GOAL #1   Title  Patient will increase knee flexion by 20 degrees     Baseline  80 degrees with pain     Time  3    Period  Weeks    Status  New    Target Date  12/07/18      PT SHORT TERM GOAL #2   Title  Patient will improve knee extension motion by 10 degrees     Baseline  -30     Time  3    Period  Weeks    Status  New    Target Date  12/07/18      PT SHORT TERM GOAL #3   Title  Patient will increase gross knee and hip strength to 4/5     Baseline  lef hip flexion and abduction 2/5; knee extension 2/5     Time  3    Period  Weeks    Status  New    Target Date  12/07/18               Plan - 11/28/18 1406    Clinical Impression Statement  Pt demonstrates good improvements in L knee strength and ROM since last visit. Pt tolerated ther ex today and was agreeable to perform HEP. Written HEP given. Discussed self care rolls such as importance of positioning knee into extension to tolerance with use of towel roll under lateral ankle to prevent hip ER and knee flexion  and HEP 2 times a day to regain ROM and return to PLOF. Pt reports he will ice when he gets home and declined ice in clinic.     Personal Factors and Comorbidities  Comorbidity 1;Finances    Comorbidities  OA    Examination-Activity Limitations  Bed Mobility;Squat;Stairs;Stand;Sit;Locomotion Level    Stability/Clinical Decision Making  Evolving/Moderate complexity    Rehab Potential  Good    PT Frequency  1x / week    PT Duration  3 weeks    PT Treatment/Interventions  ADLs/Self Care Home Management;Cryotherapy;Electrical Stimulation;Ultrasound;DME Instruction;Gait training;Functional mobility Scientist, forensic;Therapeutic activities;Therapeutic exercise;Neuromuscular re-education;Patient/family education;Manual techniques;Passive range of motion;Taping    PT Next Visit Plan  Review HEP. Review typical gait pattern. Progress HEP with mini squats and standing HEP. Reassess ROM.     PT Home Exercise Plan  QS, SLR, LAQ, seated marches, resting knee into termina extension.     Consulted and Agree with Plan of Care  Patient       Patient will benefit from skilled therapeutic intervention in order to improve the following deficits and impairments:  Abnormal gait, Pain, Postural dysfunction, Decreased activity tolerance, Decreased endurance, Decreased range of motion, Decreased strength, Increased edema  Visit Diagnosis: Chronic pain of left knee  Stiffness of left knee, not elsewhere classified  Other abnormalities of gait and mobility  Localized edema  PHYSICAL THERAPY DISCHARGE SUMMARY  Visits from Start of Care: 2  Current functional level related to goals / functional outcomes: did not return for follow up   Remaining deficits:    Education / Equipment:  No follow up    Plan: Patient agrees to discharge.  Patient goals were not met. Patient is being discharged due to meeting the stated rehab goals.  ?????       Problem List Patient Active Problem List   Diagnosis  Date Noted  . Primary localized osteoarthritis of knee 11/13/2018  . Primary osteoarthritis of left knee 10/24/2018  . Knee effusion, left 08/15/2018  . Hypertensive heart disease without CHF 03/15/2016  . Dyslipidemia, goal LDL below 70 03/15/2016  . CAD S/P percutaneous coronary angioplasty 03/15/2016  . Cardiomyopathy, ischemic 03/15/2016  . Status post coronary artery stent placement   . History of non-ST elevation myocardial infarction (NSTEMI) 03/12/2016  . Benign essential HTN 03/12/2016  . Tobacco abuse 03/12/2016  . Cocaine use 03/12/2016  . Alcohol abuse 03/12/2016   Clarisse Gouge, PT DPT   Carney Living PT DPT  11/28/2018, 2:13 PM  [facility] [address] [place], [place], [address] Phone: [phone]   Fax:  [phone]  Name: KYRESE GARTMAN MRN: [id number] Date of Birth: May 16, 1956

## 2018-12-12 ENCOUNTER — Telehealth: Payer: Self-pay | Admitting: Physical Therapy

## 2018-12-12 ENCOUNTER — Ambulatory Visit: Payer: Medicaid Other | Admitting: Physical Therapy

## 2018-12-12 NOTE — Telephone Encounter (Signed)
Spoke with patient regarding visits going forward. Patient twill be scheduled next week as a high priority patient.

## 2018-12-15 ENCOUNTER — Other Ambulatory Visit: Payer: Self-pay | Admitting: Cardiology

## 2018-12-17 NOTE — Telephone Encounter (Signed)
Carvedilol refilled.

## 2019-01-21 ENCOUNTER — Telehealth: Payer: Self-pay | Admitting: Physical Therapy

## 2019-01-21 ENCOUNTER — Ambulatory Visit: Payer: Medicaid Other | Attending: Orthopedic Surgery | Admitting: Physical Therapy

## 2019-01-21 NOTE — Telephone Encounter (Signed)
Called patient regarding no show for 9:30 appointment. Spoke with patient and he reports forgot appointment. He will call back to reschedule.

## 2019-03-20 ENCOUNTER — Telehealth: Payer: Self-pay | Admitting: *Deleted

## 2019-03-20 NOTE — Telephone Encounter (Signed)
A message was left, re: follow up visit. 

## 2019-04-01 ENCOUNTER — Telehealth: Payer: Self-pay

## 2019-04-01 NOTE — Telephone Encounter (Signed)
We have attempted to call the patient two times to schedule sleep study.  Patient has been unavailable at the phone numbers we have on file and has not returned our calls. If patient calls back we will schedule them for their sleep study.  

## 2019-04-11 ENCOUNTER — Other Ambulatory Visit: Payer: Self-pay

## 2019-04-11 ENCOUNTER — Encounter (HOSPITAL_COMMUNITY): Payer: Self-pay

## 2019-04-11 ENCOUNTER — Emergency Department (HOSPITAL_COMMUNITY)
Admission: EM | Admit: 2019-04-11 | Discharge: 2019-04-11 | Disposition: A | Discharge status: Home / Self Care | Payer: Medicaid Other | Attending: Emergency Medicine | Admitting: Emergency Medicine

## 2019-04-11 DIAGNOSIS — Z20828 Contact with and (suspected) exposure to other viral communicable diseases: Secondary | ICD-10-CM | POA: Insufficient documentation

## 2019-04-11 DIAGNOSIS — Z79899 Other long term (current) drug therapy: Secondary | ICD-10-CM | POA: Insufficient documentation

## 2019-04-11 DIAGNOSIS — I10 Essential (primary) hypertension: Secondary | ICD-10-CM | POA: Insufficient documentation

## 2019-04-11 DIAGNOSIS — F17210 Nicotine dependence, cigarettes, uncomplicated: Secondary | ICD-10-CM | POA: Diagnosis not present

## 2019-04-11 DIAGNOSIS — I2510 Atherosclerotic heart disease of native coronary artery without angina pectoris: Secondary | ICD-10-CM | POA: Insufficient documentation

## 2019-04-11 DIAGNOSIS — N4889 Other specified disorders of penis: Secondary | ICD-10-CM | POA: Diagnosis not present

## 2019-04-11 DIAGNOSIS — Z96652 Presence of left artificial knee joint: Secondary | ICD-10-CM | POA: Insufficient documentation

## 2019-04-11 LAB — CBC WITH DIFFERENTIAL/PLATELET
Abs Immature Granulocytes: 0.01 10*3/uL (ref 0.00–0.07)
Basophils Absolute: 0 10*3/uL (ref 0.0–0.1)
Basophils Relative: 1 %
Eosinophils Absolute: 0.1 10*3/uL (ref 0.0–0.5)
Eosinophils Relative: 1 %
HCT: 46 % (ref 39.0–52.0)
Hemoglobin: 15.8 g/dL (ref 13.0–17.0)
Immature Granulocytes: 0 %
Lymphocytes Relative: 27 %
Lymphs Abs: 1.6 10*3/uL (ref 0.7–4.0)
MCH: 30.9 pg (ref 26.0–34.0)
MCHC: 34.3 g/dL (ref 30.0–36.0)
MCV: 89.8 fL (ref 80.0–100.0)
Monocytes Absolute: 0.6 10*3/uL (ref 0.1–1.0)
Monocytes Relative: 10 %
Neutro Abs: 3.8 10*3/uL (ref 1.7–7.7)
Neutrophils Relative %: 61 %
Platelets: 206 10*3/uL (ref 150–400)
RBC: 5.12 MIL/uL (ref 4.22–5.81)
RDW: 13.6 % (ref 11.5–15.5)
WBC: 6.1 10*3/uL (ref 4.0–10.5)
nRBC: 0 % (ref 0.0–0.2)

## 2019-04-11 LAB — BASIC METABOLIC PANEL
Anion gap: 10 (ref 5–15)
BUN: 10 mg/dL (ref 8–23)
CO2: 24 mmol/L (ref 22–32)
Calcium: 9 mg/dL (ref 8.9–10.3)
Chloride: 106 mmol/L (ref 98–111)
Creatinine, Ser: 0.88 mg/dL (ref 0.61–1.24)
GFR calc Af Amer: >60 mL/min (ref >60)
GFR calc non Af Amer: >60 mL/min (ref >60)
Glucose, Bld: 101 mg/dL — ABNORMAL HIGH (ref 70–99)
Potassium: 3.9 mmol/L (ref 3.5–5.1)
Sodium: 140 mmol/L (ref 135–145)

## 2019-04-11 LAB — SARS CORONAVIRUS 2 BY RT PCR (HOSPITAL ORDER, PERFORMED IN ~~LOC~~ HOSPITAL LAB): SARS Coronavirus 2: NEGATIVE

## 2019-04-11 MED ORDER — MORPHINE SULFATE (PF) 4 MG/ML IV SOLN
4.0000 mg | Freq: Once | INTRAVENOUS | Status: AC
  Administered 2019-04-11: 4 mg via INTRAVENOUS
  Filled 2019-04-11: qty 1

## 2019-04-11 MED ORDER — CEPHALEXIN 250 MG PO CAPS
500.0000 mg | ORAL_CAPSULE | Freq: Once | ORAL | Status: AC
  Administered 2019-04-11: 500 mg via ORAL
  Filled 2019-04-11: qty 2

## 2019-04-11 MED ORDER — CEPHALEXIN 500 MG PO CAPS: 500.0000 mg | ORAL_CAPSULE | Freq: Every day | ORAL | 0 refills | Status: AC

## 2019-04-11 NOTE — ED Notes (Signed)
Patient verbalizes understanding of discharge instructions. Patient given education about foley care (emptying foley and scheduling follow-up appt). Also educated patient about antibiotics prescription. Opportunity for questions and answers were provided.

## 2019-04-11 NOTE — ED Provider Notes (Signed)
[facility] EMERGENCY DEPARTMENT Provider Note   CSN: [id number] Arrival date & time: 04/11/19  1622    History   Chief Complaint Chief Complaint  Patient presents with  . Groin Swelling    HPI Lucas Stark is a 63 y.o. male.     HPI   Patient with history of CAD, cocaine abuse, and STEMI, ischemic cardiomyopathy, who presents to the emergency department today complaining of penile pain and swelling.  States that he took a "right now" prior to arrival and was having intercourse with a woman when he accidentally hit his penis on her leg.  He experienced sudden onset of pain to his penis.  He then experienced swelling.  He states he normally has somewhat of a curve to his penis normally however he states that his more severe since the injury.  He has not urinated since this occurred.  He has some mild suprapubic pain at this time but denies any other symptoms. Denies blood thinner use.   Past Medical History:  Diagnosis Date  . CAD (coronary artery disease) cardiologist-- dr Stanford Breed   a. 03/2016 NSTEMI/PCI: LM nl, LAD nl, RI nl, LCX nl, OM2 99 ( 2.75 x 16 Synergy DES), RCA nl, EF 45-50%.  Marland Kitchen ETOH abuse   . History of cocaine abuse ([place])    per pt none since 2017  . History of non-ST elevation myocardial infarction (NSTEMI) 03/14/2016   s/p  PCI and DES x1  . Hyperlipidemia   . Hypertensive heart disease   . Ischemic cardiomyopathy    a. 03/2016 Echo: EF 35-40%, diff H, sev inflat HK, Gr2 DD;  b. 03/2016 EF 45-50% by LV gram.;  echo 10-24-2018, ef 45-50%  . OA (osteoarthritis)    left knee  . S/P drug eluting coronary stent placement 03/14/2016   DES x1 to OM1  . Tobacco abuse   . Wears dentures    upper  . Wears partial dentures    lower    Patient Active Problem List   Diagnosis Date Noted  . Primary localized osteoarthritis of knee 11/13/2018  . Primary osteoarthritis of left knee 10/24/2018  . Knee effusion, left 08/15/2018  . Hypertensive heart  disease without CHF 03/15/2016  . Dyslipidemia, goal LDL below 70 03/15/2016  . CAD S/P percutaneous coronary angioplasty 03/15/2016  . Cardiomyopathy, ischemic 03/15/2016  . Status post coronary artery stent placement   . History of non-ST elevation myocardial infarction (NSTEMI) 03/12/2016  . Benign essential HTN 03/12/2016  . Tobacco abuse 03/12/2016  . Cocaine use 03/12/2016  . Alcohol abuse 03/12/2016    Past Surgical History:  Procedure Laterality Date  . CARDIAC CATHETERIZATION N/A 03/14/2016   Procedure: Left Heart Cath and Coronary Angiography;  Surgeon: Burnell Blanks, MD;  Location: [facility];  Service: Cardiovascular;  Laterality: N/A;  . LACERATION REPAIR  age 76   face around eye  . PARTIAL KNEE ARTHROPLASTY Left 11/13/2018   Procedure: UNICOMPARTMENTAL KNEE;  Surgeon: Renette Butters, MD;  Location: [facility];  Service: Orthopedics;  Laterality: Left;        Home Medications    Prior to Admission medications   Medication Sig Start Date End Date Taking? Authorizing Provider  aspirin EC 81 MG tablet Take 1 tablet (81 mg total) by mouth 2 (two) times daily. For DVT prophylaxis for 30 days after surgery. 11/13/18   Prudencio Burly III, PA-C  atorvastatin (LIPITOR) 80 MG tablet TAKE 1 TABLET BY MOUTH  EVERY EVENING AT 6 pm Patient taking differently: Take 80 mg by mouth every morning. TAKE 1 TABLET BY MOUTH EVERY EVENING AT 6 pm 04/02/18   Lelon Perla, MD  carvedilol (COREG) 6.25 MG tablet TAKE 1 TABLET BY MOUTH 2 TIMES DAILY 12/17/18   Lelon Perla, MD  cephALEXin (KEFLEX) 500 MG capsule Take 1 capsule (500 mg total) by mouth at bedtime for 7 days. 04/11/19 04/18/19  Lillion Elbert S, PA-C  D3 SUPER STRENGTH 2000 units CAPS Take 2,000 Units by mouth daily. 08/24/17   [provider]  docusate sodium (COLACE) 100 MG capsule Take 1 capsule (100 mg total) by mouth 2 (two) times daily. To prevent constipation while taking pain medication.  11/13/18   Prudencio Burly III, PA-C  gabapentin (NEURONTIN) 300 MG capsule Take 1 capsule (300 mg total) by mouth 3 (three) times daily for 14 days. For 2 weeks post op for pain. 11/13/18 11/27/18  Prudencio Burly III, PA-C  losartan (COZAAR) 50 MG tablet Take 1 tablet (50 mg total) by mouth daily. 10/19/18   Lelon Perla, MD  methocarbamol (ROBAXIN) 500 MG tablet Take 1 tablet (500 mg total) by mouth every 8 (eight) hours as needed for muscle spasms. 11/13/18   Martensen, Charna Elizabeth III, PA-C  nitroGLYCERIN (NITROSTAT) 0.4 MG SL tablet Place 1 tablet (0.4 mg total) under the tongue every 5 (five) minutes as needed for chest pain. 10/10/17   Lendon Colonel, NP  ondansetron (ZOFRAN) 4 MG tablet Take 1 tablet (4 mg total) by mouth every 8 (eight) hours as needed for nausea or vomiting. 11/13/18   Prudencio Burly III, PA-C    Family History Family History  Problem Relation Age of Onset  . Heart attack Father   . Heart attack Sister     Social History Social History   Tobacco Use  . Smoking status: Current Some Day Smoker    Packs/day: 1.00    Years: 35.00    Pack years: 35.00    Types: Cigarettes  . Smokeless tobacco: Never Used  Substance Use Topics  . Alcohol use: Yes    Alcohol/week: 0.0 standard drinks    Comment: pint liquor (gin) daily; 11-06-2018 last two days had a airplane bottle each daily, and on weekend one beer and couple of drink  . Drug use: Not Currently    Comment: 11-06-2018 per pt last cocaine "when I had my heart attack"  2017     Allergies   Other   Review of Systems Review of Systems  Constitutional: Negative for fever.  HENT: Negative for congestion.   Eyes: Negative for visual disturbance.  Respiratory: Negative for shortness of breath.   Cardiovascular: Negative for chest pain.  Gastrointestinal: Negative for abdominal pain and diarrhea.  Genitourinary: Positive for penile pain and penile swelling. Negative for scrotal  swelling and testicular pain.  Musculoskeletal: Negative for back pain.  Skin: Negative for rash.  Neurological: Negative for headaches.     Physical Exam Updated Vital Signs BP (!) 163/100   Pulse 64   Temp 98.7 F (37.1 C) (Oral)   Resp (!) 23   SpO2 98%   Physical Exam Constitutional:      General: He is not in acute distress.    Appearance: He is well-developed.  Eyes:     Conjunctiva/sclera: Conjunctivae normal.  Cardiovascular:     Rate and Rhythm: Normal rate.  Pulmonary:     Effort: Pulmonary effort is normal.  Abdominal:     Palpations: Abdomen is soft.     Tenderness: There is no abdominal tenderness.  Genitourinary:    Comments: Chaperone present. Penis is uncircumcised and has s-curve. There is circumferential swelling to the base of the penis. Tissue is swollen but soft. No induration.  Skin:    General: Skin is warm and dry.  Neurological:     Mental Status: He is alert and oriented to person, place, and time.          ED Treatments / Results  Labs (all labs ordered are listed, but only abnormal results are displayed) Labs Reviewed  BASIC METABOLIC PANEL - Abnormal; Notable for the following components:      Result Value   Glucose, Bld 101 (*)    All other components within normal limits  SARS CORONAVIRUS 2 (HOSPITAL ORDER, [facility])  CBC WITH DIFFERENTIAL/PLATELET    EKG None  Radiology No results found.  Procedures Procedures (including critical care time)  Medications Ordered in ED Medications  morphine 4 MG/ML injection 4 mg (4 mg Intravenous Given 04/11/19 1722)     Initial Impression / Assessment and Plan / ED Course  I have reviewed the triage vital signs and the nursing notes.  Pertinent labs & imaging results that were available during my care of the patient were reviewed by me and considered in my medical decision making (see chart for details).     Final Clinical Impressions(s) / ED  Diagnoses   Final diagnoses:  Penile swelling   63 y/o male presenting for eval of penile swelling and pain. Was having intercourse PTA and sustained traumatic injury to the penis.   On exam he has circumferential swelling, tenderness, and curving of the penis. He has not urinated since the injury occurred.   Due to concern for possible penile fracture and urethral injury, urology was consulted.   Discussed case with Dr. Gayla Doss with urology who will eval the pt in the ED. Pt may need to go to the OR therefore COVID testing obtained and basic labs obtained.   Dr Junious Silk evaluated the patient at bedside.  He recommended that the patient go for surgery however the patient is adamant that he does not want surgery.  Risks and benefits were explained to the patient however he continues to prefer not to have surgery.  Dr. Junious Silk placed a Foley catheter and applied dressings to the patient's penis.  He recommended discharge and follow-up with him in the office next week.  He recommended to give the patient Keflex daily to help prevent urinary tract infection.  Evaluated patient following consultation.  Discussed plan for follow-up with Dr. Junious Silk in the office.  He voices understanding of the plan.  Discussed with return precautions.  He was understanding and reasons return.  Questions answered.  Patient stable for discharge.    ED Discharge Orders         Ordered    cephALEXin (KEFLEX) 500 MG capsule  Daily at bedtime     04/11/19 1933           Bishop Dublin 04/11/19 Dorna Leitz, MD 04/14/19 [phone]

## 2019-04-11 NOTE — Consult Note (Signed)
Consultation: penile swelling, urinary retention Requested by: Dr. Veryl Speak   History of Present Illness:  [name] was having sexual intercourse this afternoon after taking "rhino". He missed and hit her leg and noted penile pain and swelling. He did not hear a snap or lose his erection immediately. He noted swelling over time and SP pressure with a need to void. 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 Again he has no penile pain but pain in SP area.   Past Medical History:  Diagnosis Date  . CAD (coronary artery disease) cardiologist-- dr Stanford Breed   a. 03/2016 NSTEMI/PCI: LM nl, LAD nl, RI nl, LCX nl, OM2 99 ( 2.75 x 16 Synergy DES), RCA nl, EF 45-50%.  Marland Kitchen ETOH abuse   . History of cocaine abuse ([place])    per pt none since 2017  . History of non-ST elevation myocardial infarction (NSTEMI) 03/14/2016   s/p  PCI and DES x1  . Hyperlipidemia   . Hypertensive heart disease   . Ischemic cardiomyopathy    a. 03/2016 Echo: EF 35-40%, diff H, sev inflat HK, Gr2 DD;  b. 03/2016 EF 45-50% by LV gram.;  echo 10-24-2018, ef 45-50%  . OA (osteoarthritis)    left knee  . S/P drug eluting coronary stent placement 03/14/2016   DES x1 to OM1  . Tobacco abuse   . Wears dentures    upper  . Wears partial dentures    lower   Past Surgical History:  Procedure Laterality Date  . CARDIAC CATHETERIZATION N/A 03/14/2016   Procedure: Left Heart Cath and Coronary Angiography;  Surgeon: Burnell Blanks, MD;  Location: [facility];  Service: Cardiovascular;  Laterality: N/A;  . LACERATION REPAIR  age 58   face around eye  . PARTIAL KNEE ARTHROPLASTY Left 11/13/2018   Procedure: UNICOMPARTMENTAL KNEE;  Surgeon: Renette Butters, MD;  Location: [facility];  Service: Orthopedics;  Laterality: Left;    Home Medications:  (Not in a hospital admission)  Allergies:  Allergies  Allergen Reactions  . Other Itching    Prescription Sleeping pills, Unsure of the name of the sleeping pill     Family History  Problem Relation Age of Onset  . Heart attack Father   . Heart attack Sister    Social History:  reports that he has been smoking cigarettes. He has a 35.00 pack-year smoking history. He has never used smokeless tobacco. He reports current alcohol use. He reports previous drug use.  ROS: A complete review of systems was performed.  All systems are negative except for pertinent findings as noted. Review of Systems  All other systems reviewed and are negative.    Physical Exam:  Vital signs in last 24 hours: Temp:  [98.3 F (36.8 C)-98.7 F (37.1 C)] 98.3 F (36.8 C) (07/30 1942) Pulse Rate:  [56-70] 56 (07/30 1942) Resp:  [16-23] 18 (07/30 1942) BP: (139-163)/(89-100) 159/100 (07/30 1942) SpO2:  [96 %-100 %] 97 % (07/30 1942) General:  Alert and oriented, No acute distress HEENT: Normocephalic, atraumatic Neck: No JVD or lymphadenopathy Cardiovascular: Regular rate and rhythm Lungs: Regular rate and effort Abdomen: Soft, nontender, nondistended, no abdominal masses Back: No CVA tenderness Extremities: No edema Neurologic: Grossly  GU: As seen in the picture there is penile swelling but interestingly it's more like simple edema. I can squeeze it out. I can palpate the urethra and along the entire length of both corpora and did not note an obvious defect and he  has no pain. I can pull the penis straight and no pain. He also doesn't have typical ecchymosis of a penile fracture. He is circumcised. Meatus and glans normal. No blood per meatus. Scrotum is normal. Testicles descended bilaterally and palpably normal.   Laboratory Data:  Results for orders placed or performed during the hospital encounter of 04/11/19 (from the past 24 hour(s))  SARS Coronavirus 2 (CEPHEID - Performed in [place] hospital lab), Hosp Order     Status: None   Collection Time: 04/11/19  5:21 PM   Specimen: Nasopharyngeal Swab  Result Value Ref Range   SARS Coronavirus 2 NEGATIVE  NEGATIVE  CBC with Differential     Status: None   Collection Time: 04/11/19  5:21 PM  Result Value Ref Range   WBC 6.1 4.0 - 10.5 K/uL   RBC 5.12 4.22 - 5.81 MIL/uL   Hemoglobin 15.8 13.0 - 17.0 g/dL   HCT 46.0 39.0 - 52.0 %   MCV 89.8 80.0 - 100.0 fL   MCH 30.9 26.0 - 34.0 pg   MCHC 34.3 30.0 - 36.0 g/dL   RDW 13.6 11.5 - 15.5 %   Platelets 206 150 - 400 K/uL   nRBC 0.0 0.0 - 0.2 %   Neutrophils Relative % 61 %   Neutro Abs 3.8 1.7 - 7.7 K/uL   Lymphocytes Relative 27 %   Lymphs Abs 1.6 0.7 - 4.0 K/uL   Monocytes Relative 10 %   Monocytes Absolute 0.6 0.1 - 1.0 K/uL   Eosinophils Relative 1 %   Eosinophils Absolute 0.1 0.0 - 0.5 K/uL   Basophils Relative 1 %   Basophils Absolute 0.0 0.0 - 0.1 K/uL   Immature Granulocytes 0 %   Abs Immature Granulocytes 0.01 0.00 - 0.07 K/uL  Basic metabolic panel     Status: Abnormal   Collection Time: 04/11/19  5:21 PM  Result Value Ref Range   Sodium 140 135 - 145 mmol/L   Potassium 3.9 3.5 - 5.1 mmol/L   Chloride 106 98 - 111 mmol/L   CO2 24 22 - 32 mmol/L   Glucose, Bld 101 (H) 70 - 99 mg/dL   BUN 10 8 - 23 mg/dL   Creatinine, Ser 0.88 0.61 - 1.24 mg/dL   Calcium 9.0 8.9 - 10.3 mg/dL   GFR calc non Af Amer >60 >60 mL/min   GFR calc Af Amer >60 >60 mL/min   Anion gap 10 5 - 15   Recent Results (from the past 240 hour(s))  SARS Coronavirus 2 (CEPHEID - Performed in [place] hospital lab), Hosp Order     Status: None   Collection Time: 04/11/19  5:21 PM   Specimen: Nasopharyngeal Swab  Result Value Ref Range Status   SARS Coronavirus 2 NEGATIVE NEGATIVE Final    Comment: (NOTE) If result is NEGATIVE SARS-CoV-2 target nucleic acids are NOT DETECTED. The SARS-CoV-2 RNA is generally detectable in upper and lower  respiratory specimens during the acute phase of infection. The lowest  concentration of SARS-CoV-2 viral copies this assay can detect is 250  copies / mL. A negative result does not preclude SARS-CoV-2 infection  and  should not be used as the sole basis for treatment or other  patient management decisions.  A negative result may occur with  improper specimen collection / handling, submission of specimen other  than nasopharyngeal swab, presence of viral mutation(s) within the  areas targeted by this assay, and inadequate number of viral copies  (<250 copies /  mL). A negative result must be combined with clinical  observations, patient history, and epidemiological information. If result is POSITIVE SARS-CoV-2 target nucleic acids are DETECTED. The SARS-CoV-2 RNA is generally detectable in upper and lower  respiratory specimens dur ing the acute phase of infection.  Positive  results are indicative of active infection with SARS-CoV-2.  Clinical  correlation with patient history and other diagnostic information is  necessary to determine patient infection status.  Positive results do  not rule out bacterial infection or co-infection with other viruses. If result is PRESUMPTIVE POSTIVE SARS-CoV-2 nucleic acids MAY BE PRESENT.   A presumptive positive result was obtained on the submitted specimen  and confirmed on repeat testing.  While 2019 novel coronavirus  (SARS-CoV-2) nucleic acids may be present in the submitted sample  additional confirmatory testing may be necessary for epidemiological  and / or clinical management purposes  to differentiate between  SARS-CoV-2 and other Sarbecovirus currently known to infect humans.  If clinically indicated additional testing with an alternate test  methodology [phone]) is advised. The SARS-CoV-2 RNA is generally  detectable in upper and lower respiratory sp ecimens during the acute  phase of infection. The expected result is Negative. Fact Sheet for Patients:  StrictlyIdeas.no Fact Sheet for Healthcare Providers: BankingDealers.co.za This test is not yet approved or cleared by the [place] FDA and has been  authorized for detection and/or diagnosis of SARS-CoV-2 by FDA under an Emergency Use Authorization (EUA).  This EUA will remain in effect (meaning this test can be used) for the duration of the COVID-19 declaration under Section 564(b)(1) of the Act, 21 U.S.C. section 360bbb-3(b)(1), unless the authorization is terminated or revoked sooner. Performed at [facility], [place] [address] [place], [place] [address]    Creatinine: Recent Labs    04/11/19 1721  CREATININE 0.88    Impression/Assessment/plan:  Penile edema, possible urinary retention - I discussed with patient concern for penile fracture and I recommended a circumcision with degloving of penis and exploration of the corpora. We went over the penile anatomy and what is involved. His BP is in 180's/90's and I asked about drug use. He said he doesn't use drugs. I told him it's important to know when he last used cocaine or any other drugs because there is a risk with anesthesia. He said he did do cocaine "Monday or Tuesday". I called and spoke to anesthesia and pt's risk of MI is more related to his HTN and h/o heart stent/CAD than cocaine intoxication if it has been a few days. I discussed with patient and again recommend surgical exploration because it would mitigate the risk of penile curvature, ED and decreased penile sensation among other risks. Again he said he did not want surgery. His only concern was if he could urinate. In order to check the urethra, I placed a 16 Fr foley myself.   Procedure: I prepped the meatus and passed a 16 Fr foley. Clear urine drained and placement was routine but he did strain a lot.   I lightly wrapped the penis with a coban. He can follow-up next week for void trial on Tuesday. I told him not to try and remove the catheter that it had a balloon on it and serious injury could occur if he tried to pull it out. I also told hm no sexual activity for at least 6 weeks while he heals up. He also said  he thought of a good story to tell people that "two  wasps stung him in the penis" while he was trying to pee outside.  Festus Aloe 04/11/2019, 8:00 PM

## 2019-04-11 NOTE — ED Triage Notes (Signed)
Pt bib ems for penile pain and swelling after hitting his erected penis on the leg of the woman he was with.

## 2019-04-11 NOTE — ED Notes (Signed)
Bladder scan done at this time with urologist present.  Scan was erroneous and showed >38ml.  16Fr foley catheter given to urologist to place, along with coban for compression of pt's swollen penis.

## 2019-04-11 NOTE — Discharge Instructions (Signed)
You were given a prescription for antibiotics. Please take the antibiotic prescription fully.   You were given information to follow-up with Dr. Junious Silk.  Please call the office on Monday to schedule an appointment for follow-up next week.  Please return to the emergency department for any increased swelling to the penis, inability to produce urine in the Foley bag, increased pain, fevers or any new or worsening symptoms.  You may shower with the catheter.  Indwelling Urinary Catheter Care, Adult An indwelling urinary catheter is a thin tube that is put into your bladder. The tube helps to drain pee (urine) out of your body. The tube goes in through your urethra. Your urethra is where pee comes out of your body. Your pee will come out through the catheter, then it will go into a bag (drainage bag). Take good care of your catheter so it will work well. How to wear your catheter and bag Supplies needed  Sticky tape (adhesive tape) or a leg strap.  Alcohol wipe or soap and water (if you use tape).  A clean towel (if you use tape).  Large overnight bag.  Smaller bag (leg bag). Wearing your catheter Attach your catheter to your leg with tape or a leg strap.  Make sure the catheter is not pulled tight.  If a leg strap gets wet, take it off and put on a dry strap.  If you use tape to hold the bag on your leg: 1. Use an alcohol wipe or soap and water to wash your skin where the tape made it sticky before. 2. Use a clean towel to pat-dry that skin. 3. Use new tape to make the bag stay on your leg. Wearing your bags You should have been given a large overnight bag.  You may wear the overnight bag in the day or night.  Always have the overnight bag lower than your bladder.  Do not let the bag touch the floor.  Before you go to sleep, put a clean plastic bag in a wastebasket. Then hang the overnight bag inside the wastebasket. You should also have a smaller leg bag that fits under your  clothes.  Always wear the leg bag below your knee.  Do not wear your leg bag at night. How to care for your skin and catheter Supplies needed  A clean washcloth.  Water and mild soap.  A clean towel. Caring for your skin and catheter      Clean the skin around your catheter every day: ? Wash your hands with soap and water. ? Wet a clean washcloth in warm water and mild soap. ? Clean the skin around your urethra. ? If you are male: ? Gently spread the folds of skin around your vagina (labia). ? With the washcloth in your other hand, wipe the inner side of your labia on each side. Wipe from front to back. ? If you are male: ? Pull back any skin that covers the end of your penis (foreskin). ? With the washcloth in your other hand, wipe your penis in small circles. Start wiping at the tip of your penis, then move away from the catheter. ? Move the foreskin back in place, if needed. ? With your free hand, hold the catheter close to where it goes into your body. ? Keep holding the catheter during cleaning so it does not get pulled out. ? With the washcloth in your other hand, clean the catheter. ? Only wipe downward on the catheter. ?  Do not wipe upward toward your body. Doing this may push germs into your urethra and cause infection. ? Use a clean towel to pat-dry the catheter and the skin around it. Make sure to wipe off all soap. ? Wash your hands with soap and water.  Shower every day. Do not take baths.  Do not use cream, ointment, or lotion on the area where the catheter goes into your body, unless your doctor tells you to.  Do not use powders, sprays, or lotions on your genital area.  Check your skin around the catheter every day for signs of infection. Check for: ? Redness, swelling, or pain. ? Fluid or blood. ? Warmth. ? Pus or a bad smell. How to empty the bag Supplies needed  Rubbing alcohol.  Gauze pad or cotton ball.  Tape or a leg strap. Emptying the  bag Pour the pee out of your bag when it is ?- full, or at least 2-3 times a day. Do this for your overnight bag and your leg bag. 1. Wash your hands with soap and water. 2. Separate (detach) the bag from your leg. 3. Hold the bag over the toilet or a clean pail. Keep the bag lower than your hips and bladder. This is so the pee (urine) does not go back into the tube. 4. Open the pour spout. It is at the bottom of the bag. 5. Empty the pee into the toilet or pail. Do not let the pour spout touch any surface. 6. Put rubbing alcohol on a gauze pad or cotton ball. 7. Use the gauze pad or cotton ball to clean the pour spout. 8. Close the pour spout. 9. Attach the bag to your leg with tape or a leg strap. 10. Wash your hands with soap and water. Follow instructions for cleaning the drainage bag:  From the product maker.  As told by your doctor. How to change the bag Supplies needed  Alcohol wipes.  A clean bag.  Tape or a leg strap. Changing the bag Replace your bag when it starts to leak, smell bad, or look dirty. 1. Wash your hands with soap and water. 2. Separate the dirty bag from your leg. 3. Pinch the catheter with your fingers so that pee does not spill out. 4. Separate the catheter tube from the bag tube where these tubes connect (at the connection valve). Do not let the tubes touch any surface. 5. Clean the end of the catheter tube with an alcohol wipe. Use a different alcohol wipe to clean the end of the bag tube. 6. Connect the catheter tube to the tube of the clean bag. 7. Attach the clean bag to your leg with tape or a leg strap. Do not make the bag tight on your leg. 8. Wash your hands with soap and water. General rules   Never pull on your catheter. Never try to take it out. Doing that can hurt you.  Always wash your hands before and after you touch your catheter or bag. Use a mild, fragrance-free soap. If you do not have soap and water, use hand sanitizer.  Always  make sure there are no twists or bends (kinks) in the catheter tube.  Always make sure there are no leaks in the catheter or bag.  Drink enough fluid to keep your pee pale yellow.  Do not take baths, swim, or use a hot tub.  If you are male, wipe from front to back after you poop (have a  bowel movement). Contact a doctor if:  Your pee is cloudy.  Your pee smells worse than usual.  Your catheter gets clogged.  Your catheter leaks.  Your bladder feels full. Get help right away if:  You have redness, swelling, or pain where the catheter goes into your body.  You have fluid, blood, pus, or a bad smell coming from the area where the catheter goes into your body.  Your skin feels warm where the catheter goes into your body.  You have a fever.  You have pain in your: ? Belly (abdomen). ? Legs. ? Lower back. ? Bladder.  You see blood in the catheter.  Your pee is pink or red.  You feel sick to your stomach (nauseous).  You throw up (vomit).  You have chills.  Your pee is not draining into the bag.  Your catheter gets pulled out. Summary  An indwelling urinary catheter is a thin tube that is placed into the bladder to help drain pee (urine) out of the body.  The catheter is placed into the part of the body that drains pee from the bladder (urethra).  Taking good care of your catheter will keep it working properly and help prevent problems.  Always wash your hands before and after touching your catheter or bag.  Never pull on your catheter or try to take it out. This information is not intended to replace advice given to you by your health care provider. Make sure you discuss any questions you have with your health care provider. Document Released: 12/24/2012 Document Revised: 12/21/2018 Document Reviewed: 04/14/2017 Elsevier Patient Education  2020 [employer].

## 2019-04-23 ENCOUNTER — Other Ambulatory Visit: Payer: Self-pay | Admitting: Cardiology

## 2019-07-05 NOTE — Progress Notes (Signed)
Virtual Visit via Video Note changed to phone visit at patient request   This visit type was conducted due to national recommendations for restrictions regarding the COVID-19 Pandemic (e.g. social distancing) in an effort to limit this patient's exposure and mitigate transmission in our community.  Due to his co-morbid illnesses, this patient is at least at moderate risk for complications without adequate follow up.  This format is felt to be most appropriate for this patient at this time.  All issues noted in this document were discussed and addressed.  A limited physical exam was performed with this format.  Please refer to the patient's chart for his consent to telehealth for [facility].   Date:  07/08/2019   ID:  Lucas Stark, DOB 05/12/1956, MRN [id number]  Patient Location:Home Provider Location: Home  PCP:  Rogers Blocker, MD  Cardiologist:  Dr Stanford Breed  Evaluation Performed:  Follow-Up Visit  Chief Complaint:  FU CAD  History of Present Illness:    Follow-up coronary artery disease.Note h/o substance abuse.Patientpreviously admitted with non-ST elevation myocardial infarction. Echocardiogram July 2017 showed ejection fraction 35-40% and grade 2 diastolic dysfunction. Cardiac catheterization July 2017 showed mild LV systolic dysfunction, [id number] second marginal. Patient had PCI of the second marginal with a drug-eluting stent. Patient had cough with ACEI. Nuclear study February 2019 showed ejection fraction 34%, prior inferior infarct but no ischemia. Echocardiogram repeated February 2020 and showed ejection fraction 45 to A999333, grade 1 diastolic dysfunction.  Since last seen,the patient denies any dyspnea on exertion, orthopnea, PND, pedal edema, palpitations, syncope or chest pain.   The patient does not have symptoms concerning for COVID-19 infection (fever, chills, cough, or new shortness of breath).    Past Medical History:  Diagnosis Date  . CAD (coronary artery  disease) cardiologist-- dr Stanford Breed   a. 03/2016 NSTEMI/PCI: LM nl, LAD nl, RI nl, LCX nl, OM2 99 ( 2.75 x 16 Synergy DES), RCA nl, EF 45-50%.  Marland Kitchen ETOH abuse   . History of cocaine abuse ([place])    per pt none since 2017  . History of non-ST elevation myocardial infarction (NSTEMI) 03/14/2016   s/p  PCI and DES x1  . Hyperlipidemia   . Hypertensive heart disease   . Ischemic cardiomyopathy    a. 03/2016 Echo: EF 35-40%, diff H, sev inflat HK, Gr2 DD;  b. 03/2016 EF 45-50% by LV gram.;  echo 10-24-2018, ef 45-50%  . OA (osteoarthritis)    left knee  . S/P drug eluting coronary stent placement 03/14/2016   DES x1 to OM1  . Tobacco abuse   . Wears dentures    upper  . Wears partial dentures    lower   Past Surgical History:  Procedure Laterality Date  . CARDIAC CATHETERIZATION N/A 03/14/2016   Procedure: Left Heart Cath and Coronary Angiography;  Surgeon: Burnell Blanks, MD;  Location: [facility];  Service: Cardiovascular;  Laterality: N/A;  . LACERATION REPAIR  age 66   face around eye  . PARTIAL KNEE ARTHROPLASTY Left 11/13/2018   Procedure: UNICOMPARTMENTAL KNEE;  Surgeon: Renette Butters, MD;  Location: [facility];  Service: Orthopedics;  Laterality: Left;     Current Meds  Medication Sig  . aspirin EC 81 MG tablet Take 1 tablet (81 mg total) by mouth 2 (two) times daily. For DVT prophylaxis for 30 days after surgery.  Marland Kitchen atorvastatin (LIPITOR) 80 MG tablet TAKE 1 TABLET BY MOUTH EVERY EVENING AT 6 pm  KEEP  OV  . carvedilol (COREG) 6.25 MG tablet TAKE 1 TABLET BY MOUTH 2 TIMES DAILY  . D3 SUPER STRENGTH 2000 units CAPS Take 2,000 Units by mouth daily.  Marland Kitchen losartan (COZAAR) 50 MG tablet Take 1 tablet (50 mg total) by mouth daily.  . methocarbamol (ROBAXIN) 500 MG tablet Take 1 tablet (500 mg total) by mouth every 8 (eight) hours as needed for muscle spasms.  . nitroGLYCERIN (NITROSTAT) 0.4 MG SL tablet Place 1 tablet (0.4 mg total) under the tongue every 5 (five) minutes as  needed for chest pain.  Marland Kitchen ondansetron (ZOFRAN) 4 MG tablet Take 1 tablet (4 mg total) by mouth every 8 (eight) hours as needed for nausea or vomiting.  . [DISCONTINUED] docusate sodium (COLACE) 100 MG capsule Take 1 capsule (100 mg total) by mouth 2 (two) times daily. To prevent constipation while taking pain medication.     Allergies:   Other   Social History   Tobacco Use  . Smoking status: Current Some Day Smoker    Packs/day: 1.00    Years: 35.00    Pack years: 35.00    Types: Cigarettes  . Smokeless tobacco: Never Used  Substance Use Topics  . Alcohol use: Yes    Alcohol/week: 0.0 standard drinks    Comment: pint liquor (gin) daily; 11-06-2018 last two days had a airplane bottle each daily, and on weekend one beer and couple of drink  . Drug use: Not Currently    Comment: 11-06-2018 per pt last cocaine "when I had my heart attack"  2017     Family Hx: The patient's family history includes Heart attack in his father and sister.  ROS:   Please see the history of present illness.    No Fever, chills  or productive cough All other systems reviewed and are negative.   Recent Labs: 10/19/2018: ALT 16 04/11/2019: BUN 10; Creatinine, Ser 0.88; Hemoglobin 15.8; Platelets 206; Potassium 3.9; Sodium 140   Recent Lipid Panel Lab Results  Component Value Date/Time   CHOL 138 10/19/2018 08:29 AM   TRIG 130 10/19/2018 08:29 AM   HDL 42 10/19/2018 08:29 AM   CHOLHDL 3.3 10/19/2018 08:29 AM   CHOLHDL 3.1 03/12/2016 01:12 PM   LDLCALC 70 10/19/2018 08:29 AM    Wt Readings from Last 3 Encounters:  07/08/19 160 lb (72.6 kg)  11/13/18 180 lb (81.6 kg)  11/06/18 180 lb (81.6 kg)     Objective:    Vital Signs:  Ht 5\' 5"  (1.651 m)   Wt 160 lb (72.6 kg)   BMI 26.63 kg/m    VITAL SIGNS:  reviewed NAD Answers questions appropriately Normal affect Remainder of physical examination not performed (telehealth visit; coronavirus pandemic)  ASSESSMENT & PLAN:    1. Coronary  artery disease-patient is not having chest pain.  Continue medical therapy with aspirin and statin. 2. Ischemic cardiomyopathy-plan to continue ARB and beta-blocker.  Echocardiogram improved on most recent echocardiogram.  He is not in congestive heart failure clinically. 3. Tobacco abuse-patient counseled on discontinuing. 4. Hyperlipidemia-continue statin. 5. History of substance abuse-he denies recent use.  COVID-19 Education: The importance of social distancing was discussed today.  Time:   Today, I have spent 15 minutes with the patient with telehealth technology discussing the above problems.     Medication Adjustments/Labs and Tests Ordered: Current medicines are reviewed at length with the patient today.  Concerns regarding medicines are outlined above.   Tests Ordered: No orders of the defined types were placed  in this encounter.   Medication Changes: No orders of the defined types were placed in this encounter.   Follow Up:  Either In Person or Virtual in 6 month(s)  Signed, Kirk Ruths, MD  07/08/2019 8:24 AM    [place]

## 2019-07-08 ENCOUNTER — Telehealth (INDEPENDENT_AMBULATORY_CARE_PROVIDER_SITE_OTHER): Payer: Medicaid Other | Admitting: Cardiology

## 2019-07-08 ENCOUNTER — Encounter: Payer: Self-pay | Admitting: Cardiology

## 2019-07-08 VITALS — Ht 65.0 in | Wt 160.0 lb

## 2019-07-08 DIAGNOSIS — I2510 Atherosclerotic heart disease of native coronary artery without angina pectoris: Secondary | ICD-10-CM | POA: Diagnosis not present

## 2019-07-08 DIAGNOSIS — E7800 Pure hypercholesterolemia, unspecified: Secondary | ICD-10-CM

## 2019-07-08 DIAGNOSIS — Z720 Tobacco use: Secondary | ICD-10-CM

## 2019-07-08 DIAGNOSIS — Z9861 Coronary angioplasty status: Secondary | ICD-10-CM

## 2019-07-08 DIAGNOSIS — I255 Ischemic cardiomyopathy: Secondary | ICD-10-CM | POA: Diagnosis not present

## 2019-07-08 NOTE — Patient Instructions (Signed)
Medication Instructions:  NO CHANGE *If you need a refill on your cardiac medications before your next appointment, please call your pharmacy*  Lab Work: If you have labs (blood work) drawn today and your tests are completely normal, you will receive your results only by: . MyChart Message (if you have MyChart) OR . A paper copy in the mail If you have any lab test that is abnormal or we need to change your treatment, we will call you to review the results.  Follow-Up: At [facility], you and your health needs are our priority.  As part of our continuing mission to provide you with exceptional heart care, we have created designated Provider Care Teams.  These Care Teams include your primary Cardiologist (physician) and Advanced Practice Providers (APPs -  Physician Assistants and Nurse Practitioners) who all work together to provide you with the care you need, when you need it.  Your next appointment:   6 month(s)  The format for your next appointment:   In Person  Provider:   Brian Crenshaw, MD   

## 2019-07-11 ENCOUNTER — Other Ambulatory Visit: Payer: Self-pay | Admitting: Cardiology

## 2019-07-12 ENCOUNTER — Encounter: Payer: Self-pay | Admitting: Physical Therapy

## 2019-07-22 ENCOUNTER — Ambulatory Visit
Admission: RE | Admit: 2019-07-22 | Discharge: 2019-07-22 | Disposition: A | Discharge status: Home / Self Care | Payer: Medicaid Other | Source: Ambulatory Visit | Attending: Family Medicine | Admitting: Family Medicine

## 2019-07-22 ENCOUNTER — Other Ambulatory Visit: Payer: Self-pay | Admitting: Family Medicine

## 2019-07-22 DIAGNOSIS — R52 Pain, unspecified: Secondary | ICD-10-CM

## 2019-08-05 ENCOUNTER — Other Ambulatory Visit: Payer: Self-pay | Admitting: Family Medicine

## 2019-08-05 DIAGNOSIS — R519 Headache, unspecified: Secondary | ICD-10-CM

## 2019-08-31 ENCOUNTER — Other Ambulatory Visit: Payer: Self-pay

## 2019-08-31 ENCOUNTER — Ambulatory Visit
Admission: RE | Admit: 2019-08-31 | Discharge: 2019-08-31 | Disposition: A | Discharge status: Home / Self Care | Payer: Medicaid Other | Source: Ambulatory Visit | Attending: Family Medicine | Admitting: Family Medicine

## 2019-08-31 DIAGNOSIS — R519 Headache, unspecified: Secondary | ICD-10-CM

## 2019-09-17 ENCOUNTER — Telehealth: Payer: Self-pay | Admitting: Cardiology

## 2019-09-17 NOTE — Telephone Encounter (Signed)
Spoke with pt and has noted chest pain and SOB for about a week Pt had another DR's appointment today and B/P was elevated also pt said EKG was done and was told it was normal Appt has been made for Friday and offered pt an appt for this afternoon and pt declined stated would wait and keep Friday's appt and if has worsening symptoms would go to ED .Adonis Housekeeper

## 2019-09-17 NOTE — Telephone Encounter (Signed)
New Message  Pt c/o of Chest Pain: STAT if CP now or developed within 24 hours  1. Are you having CP right now? Yes  2. Are you experiencing any other symptoms (ex. SOB, nausea, vomiting, sweating)? SOB on exertion, BP 142/100 (stopped Losartan 2 weeks ago due to it causing headaches)    3. How long have you been experiencing CP? 2 weeks  4. Is your CP continuous or coming and going? Coming and going, with activity it gets worse.   5. Have you taken Nitroglycerin? No  Appointment has been scheduled for 09/20/2019 at 8:00 am with Coletta Memos Np.  ?

## 2019-09-19 NOTE — Progress Notes (Deleted)
Cardiology Clinic Note   Patient Name: Lucas Stark Date of Encounter: 09/19/2019  Primary Care Provider:  Rogers Blocker, MD Primary Cardiologist:  No primary care provider on file.  Patient Profile    Lucas Dimitroff. Stark 64 year old male presents today for follow-up evaluation of his coronary artery disease, cardiomyopathy, shortness of breath, and chest pain.  Past Medical History    Past Medical History:  Diagnosis Date  . CAD (coronary artery disease) cardiologist-- dr Stanford Breed   a. 03/2016 NSTEMI/PCI: LM nl, LAD nl, RI nl, LCX nl, OM2 99 ( 2.75 x 16 Synergy DES), RCA nl, EF 45-50%.  Marland Kitchen ETOH abuse   . History of cocaine abuse ([place])    per pt none since 2017  . History of non-ST elevation myocardial infarction (NSTEMI) 03/14/2016   s/p  PCI and DES x1  . Hyperlipidemia   . Hypertensive heart disease   . Ischemic cardiomyopathy    a. 03/2016 Echo: EF 35-40%, diff H, sev inflat HK, Gr2 DD;  b. 03/2016 EF 45-50% by LV gram.;  echo 10-24-2018, ef 45-50%  . OA (osteoarthritis)    left knee  . S/P drug eluting coronary stent placement 03/14/2016   DES x1 to OM1  . Tobacco abuse   . Wears dentures    upper  . Wears partial dentures    lower   Past Surgical History:  Procedure Laterality Date  . CARDIAC CATHETERIZATION N/A 03/14/2016   Procedure: Left Heart Cath and Coronary Angiography;  Surgeon: Burnell Blanks, MD;  Location: [facility];  Service: Cardiovascular;  Laterality: N/A;  . LACERATION REPAIR  age 48   face around eye  . PARTIAL KNEE ARTHROPLASTY Left 11/13/2018   Procedure: UNICOMPARTMENTAL KNEE;  Surgeon: Renette Butters, MD;  Location: [facility];  Service: Orthopedics;  Laterality: Left;    Allergies  Allergies  Allergen Reactions  . Other Itching    Prescription Sleeping pills, Unsure of the name of the sleeping pill    History of Present Illness    Lucas Stark has past medical history of coronary artery disease, substance abuse, and non-STEMI.   His echocardiogram 03/2016 showed an LVEF of 35 to 40% and grade 2 diastolic dysfunction.  A cardiac catheterization 7/17 showed mild LV dysfunction, 99% second marginal stenosis.  He had a PCI to the second marginal with a DES.  He was noted to have a cough with ACE inhibitor.  A nuclear stress test 2/19 showed ejection fraction of 34%, prior inferior infarct but no ischemia.  Echocardiogram repeated 10/2018 showed an ejection fraction of 45 to A999333, grade 1 diastolic dysfunction.  He was seen by Dr. Stanford Breed on 07/08/2019 via virtual platform.  During that time he denied dyspnea with exertion, orthopnea, PND, lower extremity edema, palpitations, syncope, and chest pain.  He contacted the office triage nurse line on 09/17/2019 and stated that he had been having chest pain and shortness of breath for 1 week.  He was offered a doctor's appointment that day however, he indicated that he already had a doctor's appointment at a different physicians office that day and indicated that an EKG was done and that was normal.  He presents the clinic today for follow-up and states***  *** denies chest pain, shortness of breath, lower extremity edema, fatigue, palpitations, melena, hematuria, hemoptysis, diaphoresis, weakness, presyncope, syncope, orthopnea, and PND.   Home Medications    Prior to Admission medications   Medication Sig Start Date End Date Taking?  Authorizing Provider  aspirin EC 81 MG tablet Take 1 tablet (81 mg total) by mouth 2 (two) times daily. For DVT prophylaxis for 30 days after surgery. 11/13/18   Martensen, Charna Elizabeth III, PA-C  atorvastatin (LIPITOR) 80 MG tablet TAKE 1 TABLET BY MOUTH EVERY EVENING AT 6 PM. keep office visit 07/11/19   Lelon Perla, MD  carvedilol (COREG) 6.25 MG tablet TAKE 1 TABLET BY MOUTH 2 TIMES DAILY 12/17/18   Lelon Perla, MD  D3 SUPER STRENGTH 2000 units CAPS Take 2,000 Units by mouth daily. 08/24/17   [provider]  losartan (COZAAR) 50 MG  tablet Take 1 tablet (50 mg total) by mouth daily. 10/19/18   Lelon Perla, MD  methocarbamol (ROBAXIN) 500 MG tablet Take 1 tablet (500 mg total) by mouth every 8 (eight) hours as needed for muscle spasms. 11/13/18   Martensen, Charna Elizabeth III, PA-C  nitroGLYCERIN (NITROSTAT) 0.4 MG SL tablet Place 1 tablet (0.4 mg total) under the tongue every 5 (five) minutes as needed for chest pain. 10/10/17   Lendon Colonel, NP  ondansetron (ZOFRAN) 4 MG tablet Take 1 tablet (4 mg total) by mouth every 8 (eight) hours as needed for nausea or vomiting. 11/13/18   Prudencio Burly III, PA-C    Family History    Family History  Problem Relation Age of Onset  . Heart attack Father   . Heart attack Sister    He indicated that his mother is deceased. He indicated that his father is deceased. He indicated that his sister is deceased. He indicated that his maternal grandmother is deceased. He indicated that his maternal grandfather is deceased. He indicated that his paternal grandmother is deceased. He indicated that his paternal grandfather is deceased.  Social History    Social History   Socioeconomic History  . Marital status: Legally Separated    Spouse name: Not on file  . Number of children: 8  . Years of education: Not on file  . Highest education level: Not on file  Occupational History  . Not on file  Tobacco Use  . Smoking status: Current Some Day Smoker    Packs/day: 1.00    Years: 35.00    Pack years: 35.00    Types: Cigarettes  . Smokeless tobacco: Never Used  Substance and Sexual Activity  . Alcohol use: Yes    Alcohol/week: 0.0 standard drinks    Comment: pint liquor (gin) daily; 11-06-2018 last two days had a airplane bottle each daily, and on weekend one beer and couple of drink  . Drug use: Not Currently    Comment: 11-06-2018 per pt last cocaine "when I had my heart attack"  2017  . Sexual activity: Not on file  Other Topics Concern  . Not on file  Social History  Narrative  . Not on file   Social Determinants of Health   Financial Resource Strain:   . Difficulty of Paying Living Expenses: Not on file  Food Insecurity:   . Worried About Charity fundraiser in the Last Year: Not on file  . Ran Out of Food in the Last Year: Not on file  Transportation Needs:   . Lack of Transportation (Medical): Not on file  . Lack of Transportation (Non-Medical): Not on file  Physical Activity:   . Days of Exercise per Week: Not on file  . Minutes of Exercise per Session: Not on file  Stress:   . Feeling of Stress : Not  on file  Social Connections:   . Frequency of Communication with Friends and Family: Not on file  . Frequency of Social Gatherings with Friends and Family: Not on file  . Attends Religious Services: Not on file  . Active Member of Clubs or Organizations: Not on file  . Attends Archivist Meetings: Not on file  . Marital Status: Not on file  Intimate Partner Violence:   . Fear of Current or Ex-Partner: Not on file  . Emotionally Abused: Not on file  . Physically Abused: Not on file  . Sexually Abused: Not on file     Review of Systems    General:  No chills, fever, night sweats or weight changes.  Cardiovascular:  No chest pain, dyspnea on exertion, edema, orthopnea, palpitations, paroxysmal nocturnal dyspnea. Dermatological: No rash, lesions/masses Respiratory: No cough, dyspnea Urologic: No hematuria, dysuria Abdominal:   No nausea, vomiting, diarrhea, bright red blood per rectum, melena, or hematemesis Neurologic:  No visual changes, wkns, changes in mental status. All other systems reviewed and are otherwise negative except as noted above.  Physical Exam    VS:  There were no vitals taken for this visit. , BMI There is no height or weight on file to calculate BMI. GEN: Well nourished, well developed, in no acute distress. HEENT: normal. Neck: Supple, no JVD, carotid bruits, or masses. Cardiac: RRR, no murmurs, rubs,  or gallops. No clubbing, cyanosis, edema.  Radials/DP/PT 2+ and equal bilaterally.  Respiratory:  Respirations regular and unlabored, clear to auscultation bilaterally. GI: Soft, nontender, nondistended, BS + x 4. MS: no deformity or atrophy. Skin: warm and dry, no rash. Neuro:  Strength and sensation are intact. Psych: Normal affect.  Accessory Clinical Findings    ECG personally reviewed by me today- *** - No acute changes  EKG 11/13/2018 Sinus bradycardia with possible inferior infarct undetermined age 57 bpm  Echocardiogram 10/24/2018 IMPRESSIONS    1. The left ventricle has mildly reduced systolic function, with an ejection fraction of 45-50%. The cavity size was normal. There is mild asymmetric left ventricular hypertrophy of the septal wall. Left ventricular diastolic Doppler parameters are  consistent with impaired relaxation.  2. There is mild hypokinesis of the entire inferior left ventricular segment.  3. The right ventricle has normal systolic function. The cavity was normal. There is no increase in right ventricular wall thickness.  4. The mitral valve is normal in structure.  5. The tricuspid valve is normal in structure.  6. The aortic valve is normal in structure.  7. The pulmonic valve was normal in structure.  8. Right atrial pressure is estimated at 3 mmHg.  Nuclear stress test 11/07/2017  The left ventricular ejection fraction is moderately decreased (30-44%).  Nuclear stress EF: 34%.  Blood pressure demonstrated a normal response to exercise.  There was no ST segment deviation noted during stress.  Findings consistent with prior myocardial infarction.  This is an intermediate risk study.   Inferior wall infarct at mid and apical level no ischemia  Diffuse hypokinesis EF 34%  Assessment & Plan   1.  Chest discomfort-no chest pain today.  EKG shows*** Continue nitroglycerin 0.4 mg sublingual as needed  Coronary artery disease-no chest pain  today Continue aspirin 81 mg tablet daily Continue atorvastatin 80 mg tablet daily Heart healthy low-sodium diet-salty 6 given Increase physical activity as tolerated  Ischemic cardiomyopathy -history of reduced ejection fraction most recent echocardiogram showed significant improvement in EF to 45-50% (10/2018). Continue carvedilol 6.25  twice daily Continue losartan 50 mg tablet daily Heart healthy low-sodium diet Increase physical activity as tolerated  Hyperlipidemia-10/19/2018: Cholesterol, Total 138; HDL 42; LDL Calculated 70; Triglycerides 130 Continue atorvastatin 80 mg tablet daily Heart healthy low-sodium high-fiber diet Increase physical activity as tolerated  Disposition: Follow-up with Dr. Stanford Breed in 3 months.  Jossie Ng. [facility] [place] [address] Office [phone] Fax [phone]

## 2019-09-20 ENCOUNTER — Ambulatory Visit: Payer: Medicaid Other | Admitting: General Practice

## 2019-09-23 ENCOUNTER — Other Ambulatory Visit: Payer: Self-pay | Admitting: Family Medicine

## 2019-09-23 DIAGNOSIS — R519 Headache, unspecified: Secondary | ICD-10-CM

## 2019-09-23 NOTE — Progress Notes (Signed)
Cardiology Clinic Note   Patient Name: Lucas Stark Date of Encounter: 09/24/2019  Primary Care Provider:  Vassie Moment, MD Primary Cardiologist:  Kirk Ruths, MD  Patient Profile    Lucas Stark 64 year old male presents today for an evaluation of his chest pain.  Past Medical History    Past Medical History:  Diagnosis Date  . CAD (coronary artery disease) cardiologist-- dr Stanford Breed   a. 03/2016 NSTEMI/PCI: LM nl, LAD nl, RI nl, LCX nl, OM2 99 ( 2.75 x 16 Synergy DES), RCA nl, EF 45-50%.  Marland Kitchen ETOH abuse   . History of cocaine abuse ([place])    per pt none since 2017  . History of non-ST elevation myocardial infarction (NSTEMI) 03/14/2016   s/p  PCI and DES x1  . Hyperlipidemia   . Hypertensive heart disease   . Ischemic cardiomyopathy    a. 03/2016 Echo: EF 35-40%, diff H, sev inflat HK, Gr2 DD;  b. 03/2016 EF 45-50% by LV gram.;  echo 10-24-2018, ef 45-50%  . OA (osteoarthritis)    left knee  . S/P drug eluting coronary stent placement 03/14/2016   DES x1 to OM1  . Tobacco abuse   . Wears dentures    upper  . Wears partial dentures    lower   Past Surgical History:  Procedure Laterality Date  . CARDIAC CATHETERIZATION N/A 03/14/2016   Procedure: Left Heart Cath and Coronary Angiography;  Surgeon: Burnell Blanks, MD;  Location: [facility];  Service: Cardiovascular;  Laterality: N/A;  . LACERATION REPAIR  age 89   face around eye  . PARTIAL KNEE ARTHROPLASTY Left 11/13/2018   Procedure: UNICOMPARTMENTAL KNEE;  Surgeon: Renette Butters, MD;  Location: [facility];  Service: Orthopedics;  Laterality: Left;    Allergies  Allergies  Allergen Reactions  . Other Itching    Prescription Sleeping pills, Unsure of the name of the sleeping pill    History of Present Illness    [name] has a past medical history of hypertension, CHF, CAD, ischemic cardiomyopathy, tobacco abuse, and cocaine use.  He was previously admitted for non-STEMI.  His  echocardiogram 03/2016 showed an ejection fraction of 35-40% and grade 2 diastolic dysfunction.  His cardiac catheterization 03/2016 showed mild LV systolic dysfunction, [id number] second marginal.  He underwent PCI of the second marginal with a DES.  He had a cough with ACE inhibitor's.  A nuclear study 10/2017 ejection fraction of 34%, prior inferior infarct but no ischemia.  An echocardiogram 10/2018 showed an ejection fraction of 45--50%, and grade 1 diastolic dysfunction.  He was last seen by Dr. Stanford Breed 07/08/2019 during that time he denied dyspnea with exertion, orthopnea, PND, pedal edema, palpitations, syncope, and chest pain.  He contacted cardiology office on 09/17/2019 stating that he had chest pain and SOB for about 1 week.  He was offered an appointment that day.  However, he stated that he had already been to a doctor's appointment, had an EKG which showed normal heart rhythm.  His blood pressure was also evaluated at that time.  He was scheduled for an appointment on 09/20/2019.  He presented 2 hours late for this appointment and was rescheduled to today (09/24/2019).  He presents the clinic today and states he has not been taking his losartan.  He states he has been eating much more recently and has gained 23 pounds.  He continues to be physically active and walks his dog for 30 days 45 minutes daily.  He states that his chest discomfort happens after eating and if he lays down after eating.  He has noticed that he has increased work of breathing with more vigorous activity however, his breathing returns to normal with normal activities.  I will start 12.5 chlorthalidone for his blood pressure ,give him the salty 6 and GERD diet instructions and prescribed Protonix 20 mg daily.  He denies  lower extremity edema, fatigue, palpitations, melena, hematuria, hemoptysis, diaphoresis, weakness, presyncope, syncope, orthopnea, and PND.  Home Medications    Prior to Admission medications   Medication Sig Start  Date End Date Taking? Authorizing Provider  aspirin EC 81 MG tablet Take 1 tablet (81 mg total) by mouth 2 (two) times daily. For DVT prophylaxis for 30 days after surgery. 11/13/18   Martensen, Charna Elizabeth III, PA-C  atorvastatin (LIPITOR) 80 MG tablet TAKE 1 TABLET BY MOUTH EVERY EVENING AT 6 PM. keep office visit 07/11/19   Lelon Perla, MD  carvedilol (COREG) 6.25 MG tablet TAKE 1 TABLET BY MOUTH 2 TIMES DAILY 12/17/18   Lelon Perla, MD  D3 SUPER STRENGTH 2000 units CAPS Take 2,000 Units by mouth daily. 08/24/17   [provider]  losartan (COZAAR) 50 MG tablet Take 1 tablet (50 mg total) by mouth daily. 10/19/18   Lelon Perla, MD  methocarbamol (ROBAXIN) 500 MG tablet Take 1 tablet (500 mg total) by mouth every 8 (eight) hours as needed for muscle spasms. 11/13/18   Martensen, Charna Elizabeth III, PA-C  nitroGLYCERIN (NITROSTAT) 0.4 MG SL tablet Place 1 tablet (0.4 mg total) under the tongue every 5 (five) minutes as needed for chest pain. 10/10/17   Lendon Colonel, NP  ondansetron (ZOFRAN) 4 MG tablet Take 1 tablet (4 mg total) by mouth every 8 (eight) hours as needed for nausea or vomiting. 11/13/18   Prudencio Burly III, PA-C    Family History    Family History  Problem Relation Age of Onset  . Heart attack Father   . Heart attack Sister    He indicated that his mother is deceased. He indicated that his father is deceased. He indicated that his sister is deceased. He indicated that his maternal grandmother is deceased. He indicated that his maternal grandfather is deceased. He indicated that his paternal grandmother is deceased. He indicated that his paternal grandfather is deceased.  Social History    Social History   Socioeconomic History  . Marital status: Legally Separated    Spouse name: Not on file  . Number of children: 8  . Years of education: Not on file  . Highest education level: Not on file  Occupational History  . Not on file  Tobacco Use   . Smoking status: Current Some Day Smoker    Packs/day: 1.00    Years: 35.00    Pack years: 35.00    Types: Cigarettes  . Smokeless tobacco: Never Used  Substance and Sexual Activity  . Alcohol use: Yes    Alcohol/week: 0.0 standard drinks    Comment: pint liquor (gin) daily; 11-06-2018 last two days had a airplane bottle each daily, and on weekend one beer and couple of drink  . Drug use: Not Currently    Comment: 11-06-2018 per pt last cocaine "when I had my heart attack"  2017  . Sexual activity: Not on file  Other Topics Concern  . Not on file  Social History Narrative  . Not on file   Social Determinants of Health  Financial Resource Strain:   . Difficulty of Paying Living Expenses: Not on file  Food Insecurity:   . Worried About Charity fundraiser in the Last Year: Not on file  . Ran Out of Food in the Last Year: Not on file  Transportation Needs:   . Lack of Transportation (Medical): Not on file  . Lack of Transportation (Non-Medical): Not on file  Physical Activity:   . Days of Exercise per Week: Not on file  . Minutes of Exercise per Session: Not on file  Stress:   . Feeling of Stress : Not on file  Social Connections:   . Frequency of Communication with Friends and Family: Not on file  . Frequency of Social Gatherings with Friends and Family: Not on file  . Attends Religious Services: Not on file  . Active Member of Clubs or Organizations: Not on file  . Attends Archivist Meetings: Not on file  . Marital Status: Not on file  Intimate Partner Violence:   . Fear of Current or Ex-Partner: Not on file  . Emotionally Abused: Not on file  . Physically Abused: Not on file  . Sexually Abused: Not on file     Review of Systems    General:  No chills, fever, night sweats or weight changes.  Cardiovascular:  No chest pain, dyspnea on exertion, edema, orthopnea, palpitations, paroxysmal nocturnal dyspnea. Dermatological: No rash,  lesions/masses Respiratory: No cough, dyspnea Urologic: No hematuria, dysuria Abdominal:   No nausea, vomiting, diarrhea, bright red blood per rectum, melena, or hematemesis Neurologic:  No visual changes, wkns, changes in mental status. All other systems reviewed and are otherwise negative except as noted above.  Physical Exam    VS:  BP (!) 162/98   Pulse (!) 54   Ht 5\' 6"  (1.676 m)   Wt 183 lb 12.8 oz (83.4 kg)   BMI 29.67 kg/m  , BMI Body mass index is 29.67 kg/m. GEN: Well nourished, well developed, in no acute distress. HEENT: normal. Neck: Supple, no JVD, carotid bruits, or masses. Cardiac: RRR, no murmurs, rubs, or gallops. No clubbing, cyanosis, edema.  Radials/DP/PT 2+ and equal bilaterally.  Respiratory:  Respirations regular and unlabored, clear to auscultation bilaterally. GI: Soft, nontender, nondistended, BS + x 4. MS: no deformity or atrophy. Skin: warm and dry, no rash. Neuro:  Strength and sensation are intact. Psych: Normal affect.  Accessory Clinical Findings    ECG personally reviewed by me today-sinus bradycardia nonspecific T wave abnormality 54 bpm- No acute changes  EKG 11/13/2018 Sinus bradycardia possible inferior infarct 54 bpm  Echocardiogram 10/24/2018 IMPRESSIONS    1. The left ventricle has mildly reduced systolic function, with an ejection fraction of 45-50%. The cavity size was normal. There is mild asymmetric left ventricular hypertrophy of the septal wall. Left ventricular diastolic Doppler parameters are  consistent with impaired relaxation.  2. There is mild hypokinesis of the entire inferior left ventricular segment.  3. The right ventricle has normal systolic function. The cavity was normal. There is no increase in right ventricular wall thickness.  4. The mitral valve is normal in structure.  5. The tricuspid valve is normal in structure.  6. The aortic valve is normal in structure.  7. The pulmonic valve was normal in structure.   8. Right atrial pressure is estimated at 3 mmHg.  Cardiac catheterization 03/14/2016   2nd Mrg lesion, 99% stenosed. Post intervention, there is a 0% residual stenosis.  There is mild left  ventricular systolic dysfunction.   1. Single vessel CAD with severe stenosis first obtuse marginal branch 2. Successful PTCA/DES x 1 first obtuse marginal branch 3. No obstructive disease in the RCA or LAD 4. Mild segmental LV systolic dysfunction  Recommendations: Will continue ASA and Brilinta for one year post PCI. Continue statin. No beta blocker with cocaine use. Likely discharge home tomorrow am.   Assessment & Plan   1.CAD/chest pain-no chest pain today.  EKG shows sinus bradycardia nonspecific T wave abnormality 54 bpm.  Chest discomfort appears to be related to acid reflux. Continue ASA 81 mg daily Continue carvedilol 6.25 mg twice daily Continue atorvastatin 80 mg tablet daily Continue nitroglycerin sublingual tablet 0.4 mg as needed Heart healthy low-sodium diet-salty 6 given Increase physical activity as tolerated  Essential hypertension-BP today 162/98.  Did not take losartan today Continue carvedilol 6.125 mg twice daily Continue losartan 50 mg daily  Start chlorthalidone 12.5 mg daily Heart healthy low-sodium diet-salty 6 given Increase physical activity-goal 150 minutes of moderate physical activity per week BMP before next appointment  iIschemic cardiomyopathy-echocardiogram 10/2018 showed improved ejection fraction.  No shortness of breath today and appears euvolemic Continue carvedilol 6.25 mg twice daily Continue losartan 50 mg tablet daily  GERD-notices chest discomfort with laying back or laying down after eating. Start Protonix 20 mg daily Follow GERD diet-handout given Encouraged weight loss  Hyperlipidemia-LDL 70 10/19/2018 Continue atorvastatin 80 mg tablet daily Heart healthy low-sodium high-fiber diet Increase physical activity as tolerated  Tobacco  abuse-patient continues to smoke around 10 cigarettes daily Smoking cessation encouraged  Disposition: Follow-up with me in 2 weeks  Gissela Bloch M. [facility] [place] [address] Office [phone] Fax [phone]

## 2019-09-24 ENCOUNTER — Encounter: Payer: Self-pay | Admitting: General Practice

## 2019-09-24 ENCOUNTER — Other Ambulatory Visit: Payer: Self-pay

## 2019-09-24 ENCOUNTER — Telehealth: Payer: Self-pay | Admitting: *Deleted

## 2019-09-24 ENCOUNTER — Ambulatory Visit: Payer: Medicaid Other | Admitting: General Practice

## 2019-09-24 VITALS — BP 162/98 | HR 54 | Ht 66.0 in | Wt 183.8 lb

## 2019-09-24 DIAGNOSIS — K219 Gastro-esophageal reflux disease without esophagitis: Secondary | ICD-10-CM

## 2019-09-24 DIAGNOSIS — I2510 Atherosclerotic heart disease of native coronary artery without angina pectoris: Secondary | ICD-10-CM | POA: Diagnosis not present

## 2019-09-24 DIAGNOSIS — I10 Essential (primary) hypertension: Secondary | ICD-10-CM

## 2019-09-24 DIAGNOSIS — Z720 Tobacco use: Secondary | ICD-10-CM

## 2019-09-24 DIAGNOSIS — Z9861 Coronary angioplasty status: Secondary | ICD-10-CM

## 2019-09-24 DIAGNOSIS — I255 Ischemic cardiomyopathy: Secondary | ICD-10-CM

## 2019-09-24 DIAGNOSIS — E785 Hyperlipidemia, unspecified: Secondary | ICD-10-CM

## 2019-09-24 MED ORDER — PANTOPRAZOLE SODIUM 20 MG PO TBEC: 20.0000 mg | DELAYED_RELEASE_TABLET | Freq: Every day | ORAL | 3 refills | Status: DC

## 2019-09-24 MED ORDER — LOSARTAN POTASSIUM 50 MG PO TABS: 50.0000 mg | ORAL_TABLET | Freq: Every day | ORAL | 3 refills | Status: DC

## 2019-09-24 MED ORDER — CHLORTHALIDONE 25 MG PO TABS: 12.5000 mg | ORAL_TABLET | Freq: Every day | ORAL | 3 refills | Status: DC

## 2019-09-24 NOTE — Patient Instructions (Signed)
Medication Instructions:  START LOSARTAN 50MG   START CHLORTHALIDONE 12.5MG  (1/2 TAB) DAILY  START PROTONIX 20MG  DAILY If you need a refill on your cardiac medications before your next appointment, please call your pharmacy.  Labwork: BMET 3 DAYS BEFORE FOLLOW UP APPOINTMENT HERE IN OUR OFFICE AT [facility]    You will NOT need to fast   If you have labs (blood work) drawn today and your tests are completely normal, you will receive your results only by:  Lucas Stark (if you have MyChart) OR A paper copy in the mail If you have any lab test that is abnormal or we need to change your treatment, we will call you to review the results.  Special Instructions: PLEASE INCREASE PHYSICAL ACTIVITY 30 MINUTES DAILY 5 DAYS A WEEK 150 MINUTES WEEKLY  PLEASE READ AND FOLLOW SALTY 6 ATTACHED  PLEASE FOLLOW GERD DIET ATTACHED  Reduce your risk of getting COVID-19 With your heart disease it is especially important for people at increased risk of severe illness from COVID-19, and those who live with them, to protect themselves from getting COVID-19. The best way to protect yourself and to help reduce the spread of the virus that causes COVID-19 is to: Marland Kitchen Limit your interactions with other people as much as possible. . Take precautions to prevent getting COVID-19 when you do interact with others. If you start feeling sick and think you may have COVID-19, get in touch with your healthcare provider within 24 hours.  Follow-Up: IN 2 WEEKS  In Person Lucas Stark, Lucas Stark.    At [facility], you and your health needs are our priority.  As part of our continuing mission to provide you with exceptional heart care, we have created designated Provider Care Teams.  These Care Teams include your primary Cardiologist (physician) and Advanced Practice Providers (APPs -  Physician Assistants and Nurse Practitioners) who all work together to provide you with the care you need, when you need it.  Thank you for  choosing [facility] at [employer] for Gastroesophageal Reflux Disease, Adult When you have gastroesophageal reflux disease (GERD), the foods you eat and your eating habits are very important. Choosing the right foods can help ease your discomfort. Think about working with a nutrition specialist (dietitian) to help you make good choices. What are tips for following this plan?  Meals  Choose healthy foods that are low in fat, such as fruits, vegetables, whole grains, low-fat dairy products, and lean meat, fish, and poultry.  Eat small meals often instead of 3 large meals a day. Eat your meals slowly, and in a place where you are relaxed. Avoid bending over or lying down until 2-3 hours after eating.  Avoid eating meals 2-3 hours before bed.  Avoid drinking a lot of liquid with meals.  Cook foods using methods other than frying. Bake, grill, or broil food instead.  Avoid or limit: ? Chocolate. ? Peppermint or spearmint. ? Alcohol. ? Pepper. ? Black and decaffeinated coffee. ? Black and decaffeinated tea. ? Bubbly (carbonated) soft drinks. ? Caffeinated energy drinks and soft drinks.  Limit high-fat foods such as: ? Fatty meat or fried foods. ? Whole milk, cream, butter, or ice cream. ? Nuts and nut butters. ? Pastries, donuts, and sweets made with butter or shortening.  Avoid foods that cause symptoms. These foods may be different for everyone. Common foods that cause symptoms include: ? Tomatoes. ? Oranges, lemons, and limes. ? Peppers. ? Spicy  food. ? Onions and garlic. ? Vinegar. Lifestyle  Maintain a healthy weight. Ask your doctor what weight is healthy for you. If you need to lose weight, work with your doctor to do so safely.  Exercise for at least 30 minutes for 5 or more days each week, or as told by your doctor.  Wear loose-fitting clothes.  Do not smoke. If you need help quitting, ask your doctor.  Sleep with the head of your bed  higher than your feet. Use a wedge under the mattress or blocks under the bed frame to raise the head of the bed. Summary  When you have gastroesophageal reflux disease (GERD), food and lifestyle choices are very important in easing your symptoms.  Eat small meals often instead of 3 large meals a day. Eat your meals slowly, and in a place where you are relaxed.  Limit high-fat foods such as fatty meat or fried foods.  Avoid bending over or lying down until 2-3 hours after eating.  Avoid peppermint and spearmint, caffeine, alcohol, and chocolate. This information is not intended to replace advice given to you by your health care provider. Make sure you discuss any questions you have with your health care provider. Document Revised: 12/20/2018 Document Reviewed: 10/04/2016 Elsevier Patient Education  Lucas Stark.

## 2019-09-24 NOTE — Telephone Encounter (Signed)
PHARMACIST CALLED STATING PATIENT HAS 2 PRESCRIPTION STARTING TODAY ONE WAS CALLED IN BY PR[facility] FOR VALSARTAN 80 MG , AND THE OTHER BY  CARDIOLOGY - CLEAVER NP  FOR LOSARTAN 50 MG.  WHICH ONE DOES PATIENT NEED? PHARMACY IS ASKING. ( WILL HAVE TO ORDER MEDICATION NOT IN STOCK )    RN REVIEWED WITH J. CLEAVER NP - DISCONTINUE THE VALSARTAN AND CONTINUE LOSARTAN 50 MG PER J. CLEAVER NP  INFORMATION GIVEN TO PHARMACY. VERBALIZED UNDERSTANDING.

## 2019-10-03 ENCOUNTER — Other Ambulatory Visit: Payer: Self-pay | Admitting: Cardiology

## 2019-10-04 NOTE — Progress Notes (Signed)
Cardiology Clinic Note   Patient Name: KAMARIEN LANGRIDGE Date of Encounter: 10/08/2019  Primary Care Provider:  Vassie Moment, MD Primary Cardiologist:  Kirk Ruths, MD  Patient Profile    Benn Moulder. Lubinski 63 year old male presents today for a follow-up evaluation of his chest pain.  Past Medical History    Past Medical History:  Diagnosis Date  . CAD (coronary artery disease) cardiologist-- dr Stanford Breed   a. 03/2016 NSTEMI/PCI: LM nl, LAD nl, RI nl, LCX nl, OM2 99 ( 2.75 x 16 Synergy DES), RCA nl, EF 45-50%.  Marland Kitchen ETOH abuse   . History of cocaine abuse ([place])    per pt none since 2017  . History of non-ST elevation myocardial infarction (NSTEMI) 03/14/2016   s/p  PCI and DES x1  . Hyperlipidemia   . Hypertensive heart disease   . Ischemic cardiomyopathy    a. 03/2016 Echo: EF 35-40%, diff H, sev inflat HK, Gr2 DD;  b. 03/2016 EF 45-50% by LV gram.;  echo 10-24-2018, ef 45-50%  . OA (osteoarthritis)    left knee  . S/P drug eluting coronary stent placement 03/14/2016   DES x1 to OM1  . Tobacco abuse   . Wears dentures    upper  . Wears partial dentures    lower   Past Surgical History:  Procedure Laterality Date  . CARDIAC CATHETERIZATION N/A 03/14/2016   Procedure: Left Heart Cath and Coronary Angiography;  Surgeon: Burnell Blanks, MD;  Location: [place] [facility];  Service: Cardiovascular;  Laterality: N/A;  . LACERATION REPAIR  age 81   face around eye  . PARTIAL KNEE ARTHROPLASTY Left 11/13/2018   Procedure: UNICOMPARTMENTAL KNEE;  Surgeon: Renette Butters, MD;  Location: [facility];  Service: Orthopedics;  Laterality: Left;    Allergies  Allergies  Allergen Reactions  . Other Itching    Prescription Sleeping pills, Unsure of the name of the sleeping pill    History of Present Illness    Mr. Alexis has a past medical history of hypertension, CHF, CAD, ischemic cardiomyopathy, tobacco abuse, and cocaine use.  He was previously admitted for non-STEMI.  His  echocardiogram 03/2016 showed an ejection fraction of 35-40% and grade 2 diastolic dysfunction.  His cardiac catheterization 03/2016 showed mild LV systolic dysfunction, [id number] second marginal.  He underwent PCI of the second marginal with a DES.  He had a cough with ACE inhibitor's.  A nuclear study 10/2017 ejection fraction of 34%, prior inferior infarct but no ischemia.  An echocardiogram 10/2018 showed an ejection fraction of 45--50%, and grade 1 diastolic dysfunction.  He was last seen by Dr. Stanford Breed 07/08/2019 during that time he denied dyspnea with exertion, orthopnea, PND, pedal edema, palpitations, syncope, and chest pain.  He contacted cardiology office on 09/17/2019 stating that he had chest pain and SOB for about 1 week.  He was offered an appointment that day.  However, he stated that he had already been to a doctor's appointment, had an EKG which showed normal heart rhythm.  His blood pressure was also evaluated at that time.  He was scheduled for an appointment on 09/20/2019.  He presented 2 hours late for this appointment and was rescheduled to today (09/24/2019).  He presented the clinic 09/24/2019 and stated he had not been taking his losartan.  He stated he had been eating much more recently and had gained 23 pounds.  He continued to be physically active and walked his dog for 30 to 45 minutes daily.  His chest discomfort happened after eating and if he layed down after eating.  He had noticed that he had increased work of breathing with more vigorous activity however, his breathing returned to normal with normal activities.  I will started 12.5 chlorthalidone for his blood pressure ,give him the salty 6 and GERD diet instructions and prescribed Protonix 20 mg daily.  He presents the clinic today and states he feels well.  He has not noticed any further episodes of chest discomfort/pain.  He continues to walk his dog 30 to 45 minutes daily.  He has been compliant with his new medication regimen.   His blood pressure is much better controlled today 93/66.  He has no further complaints at this time.  I will get a BMP today and have him follow-up with Dr. Stanford Breed in 3 months.  He denies chest pain, lower extremity edema, fatigue, palpitations, melena, hematuria, hemoptysis, diaphoresis, weakness, presyncope, syncope, orthopnea, and PND.   Home Medications    Prior to Admission medications   Medication Sig Start Date End Date Taking? Authorizing Provider  aspirin EC 81 MG tablet Take 1 tablet (81 mg total) by mouth 2 (two) times daily. For DVT prophylaxis for 30 days after surgery. 11/13/18   Prudencio Burly III, PA-C  atorvastatin (LIPITOR) 80 MG tablet TAKE 1 TABLET BY MOUTH EVERY EVENING AT 6 PM. keep office visit 10/04/19   Deberah Pelton, NP  carvedilol (COREG) 6.25 MG tablet TAKE 1 TABLET BY MOUTH 2 TIMES DAILY 12/17/18   Lelon Perla, MD  chlorthalidone (HYGROTON) 25 MG tablet Take 0.5 tablets (12.5 mg total) by mouth daily. 09/24/19   Deberah Pelton, NP  D3 SUPER STRENGTH 2000 units CAPS Take 2,000 Units by mouth daily. 08/24/17   [provider]  losartan (COZAAR) 50 MG tablet Take 1 tablet (50 mg total) by mouth daily. 09/24/19   Deberah Pelton, NP  methocarbamol (ROBAXIN) 500 MG tablet Take 1 tablet (500 mg total) by mouth every 8 (eight) hours as needed for muscle spasms. 11/13/18   Martensen, Charna Elizabeth III, PA-C  nitroGLYCERIN (NITROSTAT) 0.4 MG SL tablet Place 1 tablet (0.4 mg total) under the tongue every 5 (five) minutes as needed for chest pain. 10/10/17   Lendon Colonel, NP  ondansetron (ZOFRAN) 4 MG tablet Take 1 tablet (4 mg total) by mouth every 8 (eight) hours as needed for nausea or vomiting. 11/13/18   Prudencio Burly III, PA-C  pantoprazole (PROTONIX) 20 MG tablet Take 1 tablet (20 mg total) by mouth daily. 09/24/19   Deberah Pelton, NP    Family History    Family History  Problem Relation Age of Onset  . Heart attack Father   .  Heart attack Sister    He indicated that his mother is deceased. He indicated that his father is deceased. He indicated that his sister is deceased. He indicated that his maternal grandmother is deceased. He indicated that his maternal grandfather is deceased. He indicated that his paternal grandmother is deceased. He indicated that his paternal grandfather is deceased.  Social History    Social History   Socioeconomic History  . Marital status: Legally Separated    Spouse name: Not on file  . Number of children: 8  . Years of education: Not on file  . Highest education level: Not on file  Occupational History  . Not on file  Tobacco Use  . Smoking status: Current Some Day Smoker  Packs/day: 1.00    Years: 35.00    Pack years: 35.00    Types: Cigarettes  . Smokeless tobacco: Never Used  Substance and Sexual Activity  . Alcohol use: Yes    Alcohol/week: 0.0 standard drinks    Comment: pint liquor (gin) daily; 11-06-2018 last two days had a airplane bottle each daily, and on weekend one beer and couple of drink  . Drug use: Not Currently    Comment: 11-06-2018 per pt last cocaine "when I had my heart attack"  2017  . Sexual activity: Not on file  Other Topics Concern  . Not on file  Social History Narrative  . Not on file   Social Determinants of Health   Financial Resource Strain:   . Difficulty of Paying Living Expenses: Not on file  Food Insecurity:   . Worried About Charity fundraiser in the Last Year: Not on file  . Ran Out of Food in the Last Year: Not on file  Transportation Needs:   . Lack of Transportation (Medical): Not on file  . Lack of Transportation (Non-Medical): Not on file  Physical Activity:   . Days of Exercise per Week: Not on file  . Minutes of Exercise per Session: Not on file  Stress:   . Feeling of Stress : Not on file  Social Connections:   . Frequency of Communication with Friends and Family: Not on file  . Frequency of Social Gatherings  with Friends and Family: Not on file  . Attends Religious Services: Not on file  . Active Member of Clubs or Organizations: Not on file  . Attends Archivist Meetings: Not on file  . Marital Status: Not on file  Intimate Partner Violence:   . Fear of Current or Ex-Partner: Not on file  . Emotionally Abused: Not on file  . Physically Abused: Not on file  . Sexually Abused: Not on file     Review of Systems    General:  No chills, fever, night sweats or weight changes.  Cardiovascular:  No chest pain, dyspnea on exertion, edema, orthopnea, palpitations, paroxysmal nocturnal dyspnea. Dermatological: No rash, lesions/masses Respiratory: No cough, dyspnea Urologic: No hematuria, dysuria Abdominal:   No nausea, vomiting, diarrhea, bright red blood per rectum, melena, or hematemesis Neurologic:  No visual changes, wkns, changes in mental status. All other systems reviewed and are otherwise negative except as noted above.  Physical Exam    VS:  BP 93/66 (BP Location: Left Arm, Patient Position: Sitting)   Pulse 69   Ht 5\' 6"  (1.676 m)   Wt 177 lb (80.3 kg)   SpO2 98%   BMI 28.57 kg/m  , BMI Body mass index is 28.57 kg/m. GEN: Well nourished, well developed, in no acute distress. HEENT: normal. Neck: Supple, no JVD, carotid bruits, or masses. Cardiac: RRR, no murmurs, rubs, or gallops. No clubbing, cyanosis, edema.  Radials/DP/PT 2+ and equal bilaterally.  Respiratory:  Respirations regular and unlabored, clear to auscultation bilaterally. GI: Soft, nontender, nondistended, BS + x 4. MS: no deformity or atrophy. Skin: warm and dry, no rash. Neuro:  Strength and sensation are intact. Psych: Normal affect.  Accessory Clinical Findings    ECG personally reviewed by me today-none today.  EKG 09/24/2019 sinus bradycardia nonspecific T wave abnormality 54 bpm- No acute changes  EKG 11/13/2018 Sinus bradycardia possible inferior infarct 54 bpm  Echocardiogram  10/24/2018 IMPRESSIONS   1. The left ventricle has mildly reduced systolic function, with an ejection  fraction of 45-50%. The cavity size was normal. There is mild asymmetric left ventricular hypertrophy of the septal wall. Left ventricular diastolic Doppler parameters are  consistent with impaired relaxation. 2. There is mild hypokinesis of the entire inferior left ventricular segment. 3. The right ventricle has normal systolic function. The cavity was normal. There is no increase in right ventricular wall thickness. 4. The mitral valve is normal in structure. 5. The tricuspid valve is normal in structure. 6. The aortic valve is normal in structure. 7. The pulmonic valve was normal in structure. 8. Right atrial pressure is estimated at 3 mmHg.  Cardiac catheterization 03/14/2016   2nd Mrg lesion, 99% stenosed. Post intervention, there is a 0% residual stenosis.  There is mild left ventricular systolic dysfunction.  1. Single vessel CAD with severe stenosis first obtuse marginal branch 2. Successful PTCA/DES x 1 first obtuse marginal branch 3. No obstructive disease in the RCA or LAD 4. Mild segmental LV systolic dysfunction  Recommendations: Will continue ASA and Brilinta for one year post PCI. Continue statin. No beta blocker with cocaine use. Likely discharge home tomorrow am.   Assessment & Plan   1.  Essential hypertension-BP today 93/66.  Did not take losartan today Continue carvedilol 6.125 mg twice daily Continue losartan 50 mg daily  Continue chlorthalidone 12.5 mg daily Heart healthy low-sodium diet-salty 6 given Increase physical activity-goal 150 minutes of moderate physical activity per week BMP today   ischemic cardiomyopathy-echocardiogram 10/2018 showed improved ejection fraction.  No shortness of breath today and appears euvolemic Continue carvedilol 6.25 mg twice daily Continue losartan 50 mg tablet daily Heart healthy low-sodium diet-salty 6  reviewed Increase physical activity as tolerated  GERD-notices chest discomfort with laying back or laying down after eating. Continue Protonix 20 mg daily Follow GERD diet-reviewed Encouraged weight loss  CAD/chest pain-no chest pain today. Chest discomfort was related to acid reflux. Continue ASA 81 mg daily Continue carvedilol 6.25 mg twice daily Continue atorvastatin 80 mg tablet daily Continue nitroglycerin sublingual tablet 0.4 mg as needed Heart healthy low-sodium diet-salty 6 given Increase physical activity as tolerated  Hyperlipidemia-LDL 70 10/19/2018 Continue atorvastatin 80 mg tablet daily Heart healthy low-sodium high-fiber diet Increase physical activity as tolerated  Tobacco abuse-patient continues to smoke around 10 cigarettes daily Smoking cessation encouraged  From my perspective, I am in favor of the Covid vaccine overall. We discussed that both the [place] vaccines that are currently available are mRNA vaccines. The three main ingredients are mRNA, normal saline, and a lipid droplet. I reviewed that mRNA triggers the protein machinery in the cell to make the spike protein. This is then presented to the immune system, which makes many antibodies to different portions of the protein. There is no way the mRNA can get into the nucleus to affect DNA. There is no material from the virus that can cause an actual infection with Covid. The booster (second) shot amplifies that immune response, and after this dose there is 95% efficacy in preventing symptomatic infection with Covid. The multiple antibodies produced means thus far, this method is also protective for the current Covid mutations that have been seen across the world. The risk of allergic reaction is largely related to the lipid droplet. People without a history of allergic reactions are monitored for 15 minutes, and those with a history of allergic reactions are monitored for 30 minutes. In general, we do  not recommend pretreating with NSAIDs, benadryl, etc so as not to affect either the  immune response to the vaccine or to mask/delay an allergic reaction. There are certain specific circumstances when this could be recommended, so I always encourage discussion between the patient and their providers to discuss further. For people with weakened immune systems, on immunosuppression, or with a history of life threatening reactions, I recommend they discuss in more detail with their treatment team. I acknowledged that this is a decision up to the patient, but I offered my availability for any questions or concerns. I expressed that I am not an immune expert but am familiar with the data regarding the vaccines to date.    Disposition: Follow-up with Dr. Stanford Breed in 3 months.   Jossie Ng. [facility] [place] [address] Office [phone] Fax [phone]

## 2019-10-08 ENCOUNTER — Ambulatory Visit: Payer: Medicaid Other | Admitting: General Practice

## 2019-10-08 ENCOUNTER — Encounter: Payer: Self-pay | Admitting: General Practice

## 2019-10-08 ENCOUNTER — Other Ambulatory Visit: Payer: Self-pay

## 2019-10-08 VITALS — BP 93/66 | HR 69 | Ht 66.0 in | Wt 177.0 lb

## 2019-10-08 DIAGNOSIS — K219 Gastro-esophageal reflux disease without esophagitis: Secondary | ICD-10-CM | POA: Diagnosis not present

## 2019-10-08 DIAGNOSIS — I255 Ischemic cardiomyopathy: Secondary | ICD-10-CM

## 2019-10-08 DIAGNOSIS — Z720 Tobacco use: Secondary | ICD-10-CM

## 2019-10-08 DIAGNOSIS — E7800 Pure hypercholesterolemia, unspecified: Secondary | ICD-10-CM

## 2019-10-08 DIAGNOSIS — I10 Essential (primary) hypertension: Secondary | ICD-10-CM | POA: Diagnosis not present

## 2019-10-08 DIAGNOSIS — I2510 Atherosclerotic heart disease of native coronary artery without angina pectoris: Secondary | ICD-10-CM

## 2019-10-08 NOTE — Patient Instructions (Signed)
Medication Instructions:  Your physician recommends that you continue on your current medications as directed. Please refer to the Current Medication list given to you today.  *If you need a refill on your cardiac medications before your next appointment, please call your pharmacy*  Lab Work: BMP today If you have labs (blood work) drawn today and your tests are completely normal, you will receive your results only by: Marland Kitchen MyChart Message (if you have MyChart) OR . A paper copy in the mail If you have any lab test that is abnormal or we need to change your treatment, we will call you to review the results.  Testing/Procedures: none  Follow-Up: At [facility], you and your health needs are our priority.  As part of our continuing mission to provide you with exceptional heart care, we have created designated Provider Care Teams.  These Care Teams include your primary Cardiologist (physician) and Advanced Practice Providers (APPs -  Physician Assistants and Nurse Practitioners) who all work together to provide you with the care you need, when you need it.  Your next appointment:   3 month(s)  The format for your next appointment:   In Person  Provider:   Kirk Ruths, MD

## 2019-10-09 LAB — BASIC METABOLIC PANEL
BUN/Creatinine Ratio: 12 (ref 10–24)
BUN: 15 mg/dL (ref 8–27)
CO2: 27 mmol/L (ref 20–29)
Calcium: 9.1 mg/dL (ref 8.6–10.2)
Chloride: 97 mmol/L (ref 96–106)
Creatinine, Ser: 1.21 mg/dL (ref 0.76–1.27)
GFR calc Af Amer: 74 mL/min/{1.73_m2} (ref >59)
GFR calc non Af Amer: 64 mL/min/{1.73_m2} (ref >59)
Glucose: 92 mg/dL (ref 65–99)
Potassium: 4.8 mmol/L (ref 3.5–5.2)
Sodium: 139 mmol/L (ref 134–144)

## 2019-10-18 ENCOUNTER — Other Ambulatory Visit: Payer: Medicaid Other

## 2019-10-25 ENCOUNTER — Telehealth: Payer: Self-pay | Admitting: Neurology

## 2019-10-25 NOTE — Telephone Encounter (Signed)
Patient PCP called to check on getting the patients scheduled for cpap titration.   Please follow up.

## 2019-10-28 NOTE — Telephone Encounter (Signed)
Per sleep lab manager the pt will have to start the process over due to Medicaid requiring everything to be completed in 6 months. I called the pt no answer, I left voicemail asking the pt to call back to schedule follow up appt with Dr. Rexene Alberts. If pt calls back please schedule him for a office visit with Dr. Rexene Alberts. If the pt has any questions you can transfer him to me.

## 2019-10-28 NOTE — Telephone Encounter (Signed)
Sheena or Meagan, can you check if we can go ahead and schedule CPAP titration study, based on when he had F2F visit and baseline study or does he need visit first and does he have to repeat baseline study?

## 2019-11-12 NOTE — Telephone Encounter (Signed)
Pt's PCP has reached out to me today about getting the pt scheduled. I will reach out to the pt again to schedule a follow up visit. Also, I gave PCP my number so the pt can call me directly to get scheduled.

## 2019-11-27 ENCOUNTER — Ambulatory Visit: Payer: Medicaid Other | Admitting: Neurology

## 2019-11-27 ENCOUNTER — Encounter: Payer: Self-pay | Admitting: Neurology

## 2019-11-27 VITALS — BP 113/80 | HR 67 | Temp 97.0°F | Ht 66.0 in | Wt 177.0 lb

## 2019-11-27 DIAGNOSIS — G478 Other sleep disorders: Secondary | ICD-10-CM | POA: Diagnosis not present

## 2019-11-27 DIAGNOSIS — E663 Overweight: Secondary | ICD-10-CM | POA: Diagnosis not present

## 2019-11-27 DIAGNOSIS — G4719 Other hypersomnia: Secondary | ICD-10-CM

## 2019-11-27 DIAGNOSIS — G4733 Obstructive sleep apnea (adult) (pediatric): Secondary | ICD-10-CM

## 2019-11-27 DIAGNOSIS — R351 Nocturia: Secondary | ICD-10-CM

## 2019-11-27 NOTE — Patient Instructions (Signed)
As discussed, your sleep study from March 2020 showed mild to moderate obstructive sleep apnea.  You did not sleep very well that night.  We may be able to do a home sleep test this time around to qualify you for sleep apnea treatment.  I will check with the sleep lab.  They will be in touch with you regarding scheduling your diagnostic test again.

## 2019-11-27 NOTE — Progress Notes (Signed)
Subjective:    Patient ID: Lucas Stark is a 64 y.o. male.  HPI     Interim history:   Lucas Stark is a 64 year old right-handed gentleman with an underlying medical history of hypertension, smoking, coronary artery disease with history of non-STEMI in 2017 and history of stent placement, ischemic cardiomyopathy, history of substance abuse (per chart review) and overweight state, who presents for reevaluation of his obstructive sleep apnea.  The patient is unaccompanied today.  I first met him on 10/18/2018 at the request of his primary care physician, at which time he reported sleepiness and sleep paralysis.  He had a baseline sleep study on 11/11/2018 which showed a reduced sleep efficiency at 40.9%, sleep latency was markedly delayed, REM latency 48 minutes, REM percentage was normal at 19.3%.  Total AHI was 8/h, REM AHI was 18.5/h, O2 nadir was 83%.  He was advised to return for a CPAP titration study to treat his mild to moderate obstructive sleep apnea.  He did not return for a second study and was lost to follow-up.  He presents for reevaluation.  Today, 11/27/2019: he reports no new symptoms, continues to have sleep paralysis episodes when he sleeps during the day. He sleeps without a set schedule, and reports there is nothing to do.  His Epworth sleepiness score is 10 out of 24.  He may be in bed by 2 PM, may sleep all day, girlfriend works second shift.  He denies any symptoms of depression.  He does not work.  He has nocturia about once or twice per average night.   The patient's allergies, current medications, family history, past medical history, past social history, past surgical history and problem list were reviewed and updated as appropriate.   Previously:   10/18/18: (He) reports difficulty waking up, sleep paralysis, and excessive daytime somnolence. I reviewed your office note from 07/27/2018, which you kindly included.  Lives with GF, has been noted to snoring, he feels like he has  to wake himself up. GF works 2nd shift.  His BT varies, no set time, rise time usually 5:30 or 6. No night to night nocturia, no AM HAs. He sees cardiology, has appt tomorrow, has had CP on the right recently, feels muscular to him, different from when he had MI. He has L partial knee replacement planned in March. No FHx of OSA. He smokes one pack per day, drinks alcohol on the weekend, denies any illicit drug use and denies caffeine use. He is not aware of any family history of OSA.    His Past Medical History Is Significant For: Past Medical History:  Diagnosis Date  . CAD (coronary artery disease) cardiologist-- dr Stanford Breed   a. 03/2016 NSTEMI/PCI: LM nl, LAD nl, RI nl, LCX nl, OM2 99 ( 2.75 x 16 Synergy DES), RCA nl, EF 45-50%.  Marland Kitchen ETOH abuse   . History of cocaine abuse ([place])    per pt none since 2017  . History of non-ST elevation myocardial infarction (NSTEMI) 03/14/2016   s/p  PCI and DES x1  . Hyperlipidemia   . Hypertensive heart disease   . Ischemic cardiomyopathy    a. 03/2016 Echo: EF 35-40%, diff H, sev inflat HK, Gr2 DD;  b. 03/2016 EF 45-50% by LV gram.;  echo 10-24-2018, ef 45-50%  . OA (osteoarthritis)    left knee  . S/P drug eluting coronary stent placement 03/14/2016   DES x1 to OM1  . Tobacco abuse   . Wears dentures  upper  . Wears partial dentures    lower    His Past Surgical History Is Significant For: Past Surgical History:  Procedure Laterality Date  . CARDIAC CATHETERIZATION N/A 03/14/2016   Procedure: Left Heart Cath and Coronary Angiography;  Surgeon: Burnell Blanks, MD;  Location: [facility];  Service: Cardiovascular;  Laterality: N/A;  . LACERATION REPAIR  age 29   face around eye  . PARTIAL KNEE ARTHROPLASTY Left 11/13/2018   Procedure: UNICOMPARTMENTAL KNEE;  Surgeon: Renette Butters, MD;  Location: [facility];  Service: Orthopedics;  Laterality: Left;    His Family History Is Significant For: Family History  Problem Relation Age of  Onset  . Heart attack Father   . Heart attack Sister     His Social History Is Significant For: Social History   Socioeconomic History  . Marital status: Legally Separated    Spouse name: Not on file  . Number of children: 8  . Years of education: Not on file  . Highest education level: Not on file  Occupational History  . Not on file  Tobacco Use  . Smoking status: Current Some Day Smoker    Packs/day: 1.00    Years: 35.00    Pack years: 35.00    Types: Cigarettes  . Smokeless tobacco: Never Used  Substance and Sexual Activity  . Alcohol use: Yes    Alcohol/week: 0.0 standard drinks    Comment: pint liquor (gin) daily; 11-06-2018 last two days had a airplane bottle each daily, and on weekend one beer and couple of drink  . Drug use: Not Currently    Comment: 11-06-2018 per pt last cocaine "when I had my heart attack"  2017  . Sexual activity: Not on file  Other Topics Concern  . Not on file  Social History Narrative  . Not on file   Social Determinants of Health   Financial Resource Strain:   . Difficulty of Paying Living Expenses:   Food Insecurity:   . Worried About Charity fundraiser in the Last Year:   . Arboriculturist in the Last Year:   Transportation Needs:   . Film/video editor (Medical):   Marland Kitchen Lack of Transportation (Non-Medical):   Physical Activity:   . Days of Exercise per Week:   . Minutes of Exercise per Session:   Stress:   . Feeling of Stress :   Social Connections:   . Frequency of Communication with Friends and Family:   . Frequency of Social Gatherings with Friends and Family:   . Attends Religious Services:   . Active Member of Clubs or Organizations:   . Attends Archivist Meetings:   Marland Kitchen Marital Status:     His Allergies Are:  Allergies  Allergen Reactions  . Other Itching    Prescription Sleeping pills, Unsure of the name of the sleeping pill  :   His Current Medications Are:  Outpatient Encounter Medications as  of 11/27/2019  Medication Sig  . aspirin EC 81 MG tablet Take 1 tablet (81 mg total) by mouth 2 (two) times daily. For DVT prophylaxis for 30 days after surgery.  Marland Kitchen atorvastatin (LIPITOR) 80 MG tablet TAKE 1 TABLET BY MOUTH EVERY EVENING AT 6 PM. keep office visit  . carvedilol (COREG) 6.25 MG tablet TAKE 1 TABLET BY MOUTH 2 TIMES DAILY  . chlorthalidone (HYGROTON) 25 MG tablet Take 0.5 tablets (12.5 mg total) by mouth daily.  . D3 SUPER STRENGTH 2000  units CAPS Take 2,000 Units by mouth daily.  . losartan (COZAAR) 50 MG tablet Take 1 tablet (50 mg total) by mouth daily.  . methocarbamol (ROBAXIN) 500 MG tablet Take 1 tablet (500 mg total) by mouth every 8 (eight) hours as needed for muscle spasms.  . nitroGLYCERIN (NITROSTAT) 0.4 MG SL tablet Place 1 tablet (0.4 mg total) under the tongue every 5 (five) minutes as needed for chest pain.  . ondansetron (ZOFRAN) 4 MG tablet Take 1 tablet (4 mg total) by mouth every 8 (eight) hours as needed for nausea or vomiting.  . pantoprazole (PROTONIX) 20 MG tablet Take 1 tablet (20 mg total) by mouth daily.   No facility-administered encounter medications on file as of 11/27/2019.  :  Review of Systems:  Out of a complete 14 point review of systems, all are reviewed and negative with the exception of these symptoms as listed below: Review of Systems  Neurological:       Pt presents today to discuss his sleep. Pt did not complete the cpap titration study. He did not return our sleep lab's calls to schedule this. Pt is unsure what happened.  Epworth Sleepiness Scale 0= would never doze 1= slight chance of dozing 2= moderate chance of dozing 3= high chance of dozing  Sitting and reading: 0 Watching TV: 3 Sitting inactive in a public place (ex. Theater or meeting): 0 As a passenger in a car for an hour without a break: 2 Lying down to rest in the afternoon: 2 Sitting and talking to someone: 0 Sitting quietly after lunch (no alcohol): 3 In a car,  while stopped in traffic: 0 Total: 10     Objective:  Neurological Exam  Physical Exam Physical Examination:   Vitals:   11/27/19 1030  BP: 113/80  Pulse: 67  Temp: (!) 97 F (36.1 C)   General Examination: The patient is a very pleasant 64 y.o. male in no acute distress. He appears well-developed and well-nourished and well groomed.   HEENT: Normocephalic, atraumatic, pupils are equal, round and reactive to light, extraocular tracking is good without limitation to gaze excursion or nystagmus noted. Normal smooth pursuit is noted. Hearing is grossly intact. Face is symmetric with normal facial animation. Speech is clear with no dysarthria noted. There is no hypophonia. There is no lip, neck/head, jaw or voice tremor. Neck is supple with full range of passive and active motion. There are no carotid bruits on auscultation. Oropharynx exam reveals: moderate mouth dryness, edentulous on top, several missing teeth on the bottom, mild to moderate airway crowding noted secondary to smaller airway entry, tonsils in place which are about 1+ bilaterally, redundant soft palate and longer tongue. Mallampati is class II. Neck circumference is 16 3/8 inches. Tongue protrudes centrally and palate elevates symmetrically.  Chest: Clear to auscultation without wheezing, rhonchi or crackles noted.  Heart: S1+S2+0, regular and normal without murmurs, rubs or gallops noted.   Abdomen: Soft, non-tender and non-distended with normal bowel sounds appreciated on auscultation.  Extremities: There is no pitting edema in the distal lower extremities bilaterally.  Skin: Warm and dry without trophic changes noted.   Musculoskeletal: exam reveals L knee swelling and discomfort.   Neurologically:  Mental status: The patient is awake, alert and oriented in all 4 spheres. His immediate and remote memory, attention, language skills and fund of knowledge are appropriate. There is no evidence of aphasia,  agnosia, apraxia or anomia. Speech is clear with normal prosody and enunciation. Thought process   is linear. Mood is normal and affect is normal.  Cranial nerves II - XII are as described above under HEENT exam. In addition: shoulder shrug is normal with equal shoulder height noted. Motor exam: Normal bulk, strength and tone is noted. There is no drift, tremor or rebound. Fine motor skills and coordination: intact with normal finger taps, normal hand movements, normal rapid alternating patting, normal foot taps and normal foot agility.  Cerebellar testing: No dysmetria or intention tremor. There is no truncal or gait ataxia.  Sensory exam: intact to light touch in the upper and lower extremities.  Gait, station and balance: He stands easily. No veering to one side is noted. No leaning to one side is noted. Posture is age-appropriate and stance is narrow based. Gait shows normal stride length and normal pace. No problems turning are noted.  Assessment and Plan:  In summary, Lucas Stark is a 64-year old male with an underlying medical history of hypertension, smoking, coronary artery disease with history of non-STEMI in 2017 and history of stent placement, ischemic cardiomyopathy, history of cocaine abuse in the past and overweight state,who presents for follow-up consultation of his sleep disturbance including daytime somnolence and sleep paralysis reported.  He had a baseline sleep study in March 2020 which indicated mild to moderate obstructive sleep apnea.  He is advised to proceed with another diagnostic sleep study to qualify for treatment.  He would be willing to consider AutoPap or CPAP therapy.  Given his medical history and in particular his cardiac history, treatment of his OSA was recommended.   I talked to the patient about maintaining a more set schedule for his bedtime and rise time.  He denies feeling depressed.  He is advised that we will call him to schedule his sleep study and take it  from there.  I plan to see him back after testing.  I answered all his questions today and he was in agreement. I spent 30 minutes in total face-to-face time and in reviewing records during pre-charting, more than 50% of which was spent in counseling and coordination of care, reviewing test results, reviewing medications and treatment regimen and/or in discussing or reviewing the diagnosis of OSA, the prognosis and treatment options. Pertinent laboratory and imaging test results that were available during this visit with the patient were reviewed by me and considered in my medical decision making (see chart for details).    

## 2019-12-14 ENCOUNTER — Ambulatory Visit: Payer: Medicaid Other | Attending: Internal Medicine

## 2019-12-14 DIAGNOSIS — Z23 Encounter for immunization: Secondary | ICD-10-CM

## 2019-12-14 NOTE — Progress Notes (Signed)
   Covid-19 Vaccination Clinic  Name:  Lucas Stark    MRN: [id number] DOB: 04/03/1956  12/14/2019  [name] was observed post Covid-19 immunization for 15 minutes without incident. He was provided with Vaccine Information Sheet and instruction to access the V-Safe system.   [name] was instructed to call 911 with any severe reactions post vaccine: Marland Kitchen Difficulty breathing  . Swelling of face and throat  . A fast heartbeat  . A bad rash all over body  . Dizziness and weakness   Immunizations Administered    Name Date Dose VIS Date Route   Moderna COVID-19 Vaccine 12/14/2019 11:35 AM 0.5 mL 08/13/2019 Intramuscular   Manufacturer: Moderna   Lot: [id number]   [id number]

## 2019-12-30 NOTE — Progress Notes (Deleted)
HPI: Follow-up coronary artery disease.Note h/o substance abuse.Patientpreviously admitted with non-ST elevation myocardial infarction. Echocardiogram July 2017 showed ejection fraction 35-40% and grade 2 diastolic dysfunction. Cardiac catheterization July 2017 showed mild LV systolic dysfunction, [id number] second marginal. Patient had PCI of the second marginal with a drug-eluting stent. Patient had cough with ACEI. Echocardiogram February 2019 showed ejection fraction 35-40%. Nuclear study February 2019 showed ejection fraction 34%, prior inferior infarct but no ischemia. Echocardiogram February 2020 showed mild LV dysfunction with ejection fraction 45 to 50%, mild left ventricular hypertrophy, grade 1 diastolic dysfunction.  Since last seen,  Current Outpatient Medications  Medication Sig Dispense Refill  . aspirin EC 81 MG tablet Take 1 tablet (81 mg total) by mouth 2 (two) times daily. For DVT prophylaxis for 30 days after surgery. 60 tablet 0  . atorvastatin (LIPITOR) 80 MG tablet TAKE 1 TABLET BY MOUTH EVERY EVENING AT 6 PM. keep office visit 90 tablet 3  . carvedilol (COREG) 6.25 MG tablet TAKE 1 TABLET BY MOUTH 2 TIMES DAILY 180 tablet 3  . chlorthalidone (HYGROTON) 25 MG tablet Take 0.5 tablets (12.5 mg total) by mouth daily. 15 tablet 3  . D3 SUPER STRENGTH 2000 units CAPS Take 2,000 Units by mouth daily.  0  . losartan (COZAAR) 50 MG tablet Take 1 tablet (50 mg total) by mouth daily. 90 tablet 3  . methocarbamol (ROBAXIN) 500 MG tablet Take 1 tablet (500 mg total) by mouth every 8 (eight) hours as needed for muscle spasms. 40 tablet 0  . nitroGLYCERIN (NITROSTAT) 0.4 MG SL tablet Place 1 tablet (0.4 mg total) under the tongue every 5 (five) minutes as needed for chest pain. 25 tablet 3  . ondansetron (ZOFRAN) 4 MG tablet Take 1 tablet (4 mg total) by mouth every 8 (eight) hours as needed for nausea or vomiting. 20 tablet 0  . pantoprazole (PROTONIX) 20 MG tablet Take 1 tablet (20  mg total) by mouth daily. 30 tablet 3   No current facility-administered medications for this visit.     Past Medical History:  Diagnosis Date  . CAD (coronary artery disease) cardiologist-- dr Stanford Breed   a. 03/2016 NSTEMI/PCI: LM nl, LAD nl, RI nl, LCX nl, OM2 99 ( 2.75 x 16 Synergy DES), RCA nl, EF 45-50%.  Marland Kitchen ETOH abuse   . History of cocaine abuse ([place])    per pt none since 2017  . History of non-ST elevation myocardial infarction (NSTEMI) 03/14/2016   s/p  PCI and DES x1  . Hyperlipidemia   . Hypertensive heart disease   . Ischemic cardiomyopathy    a. 03/2016 Echo: EF 35-40%, diff H, sev inflat HK, Gr2 DD;  b. 03/2016 EF 45-50% by LV gram.;  echo 10-24-2018, ef 45-50%  . OA (osteoarthritis)    left knee  . S/P drug eluting coronary stent placement 03/14/2016   DES x1 to OM1  . Tobacco abuse   . Wears dentures    upper  . Wears partial dentures    lower    Past Surgical History:  Procedure Laterality Date  . CARDIAC CATHETERIZATION N/A 03/14/2016   Procedure: Left Heart Cath and Coronary Angiography;  Surgeon: Burnell Blanks, MD;  Location: [facility];  Service: Cardiovascular;  Laterality: N/A;  . LACERATION REPAIR  age 64   face around eye  . PARTIAL KNEE ARTHROPLASTY Left 11/13/2018   Procedure: UNICOMPARTMENTAL KNEE;  Surgeon: Renette Butters, MD;  Location: [facility];  Service:  Orthopedics;  Laterality: Left;    Social History   Socioeconomic History  . Marital status: Legally Separated    Spouse name: Not on file  . Number of children: 8  . Years of education: Not on file  . Highest education level: Not on file  Occupational History  . Not on file  Tobacco Use  . Smoking status: Current Some Day Smoker    Packs/day: 1.00    Years: 35.00    Pack years: 35.00    Types: Cigarettes  . Smokeless tobacco: Never Used  Substance and Sexual Activity  . Alcohol use: Yes    Alcohol/week: 0.0 standard drinks    Comment: pint liquor (gin) daily;  11-06-2018 last two days had a airplane bottle each daily, and on weekend one beer and couple of drink  . Drug use: Not Currently    Comment: 11-06-2018 per pt last cocaine "when I had my heart attack"  2017  . Sexual activity: Not on file  Other Topics Concern  . Not on file  Social History Narrative  . Not on file   Social Determinants of Health   Financial Resource Strain:   . Difficulty of Paying Living Expenses:   Food Insecurity:   . Worried About Charity fundraiser in the Last Year:   . Arboriculturist in the Last Year:   Transportation Needs:   . Film/video editor (Medical):   Marland Kitchen Lack of Transportation (Non-Medical):   Physical Activity:   . Days of Exercise per Week:   . Minutes of Exercise per Session:   Stress:   . Feeling of Stress :   Social Connections:   . Frequency of Communication with Friends and Family:   . Frequency of Social Gatherings with Friends and Family:   . Attends Religious Services:   . Active Member of Clubs or Organizations:   . Attends Archivist Meetings:   Marland Kitchen Marital Status:   Intimate Partner Violence:   . Fear of Current or Ex-Partner:   . Emotionally Abused:   Marland Kitchen Physically Abused:   . Sexually Abused:     Family History  Problem Relation Age of Onset  . Heart attack Father   . Heart attack Sister     ROS: no fevers or chills, productive cough, hemoptysis, dysphasia, odynophagia, melena, hematochezia, dysuria, hematuria, rash, seizure activity, orthopnea, PND, pedal edema, claudication. Remaining systems are negative.  Physical Exam: Well-developed well-nourished in no acute distress.  Skin is warm and dry.  HEENT is normal.  Neck is supple.  Chest is clear to auscultation with normal expansion.  Cardiovascular exam is regular rate and rhythm.  Abdominal exam nontender or distended. No masses palpated. Extremities show no edema. neuro grossly intact  ECG- personally reviewed  A/P  1 coronary artery  disease-patient has not had chest pain.  Continue medical therapy with aspirin and statin.  2 ischemic cardiomyopathy-mildly reduced LV function on most recent echocardiogram.  Continue beta-blocker and ARB.  Check potassium and renal function.  3 hyperlipidemia-continue statin.  Will check lipids and liver.  4 hypertension-blood pressure controlled.  Continue present medications.  5 tobacco abuse-patient counseled on discontinuing.  6 history of substance abuse-patient has avoided by his report.  Kirk Ruths, MD

## 2020-01-01 ENCOUNTER — Encounter: Payer: Self-pay | Admitting: Gastroenterology

## 2020-01-06 ENCOUNTER — Ambulatory Visit: Payer: Medicaid Other | Admitting: Cardiology

## 2020-01-06 NOTE — Progress Notes (Signed)
HPI: Follow-up coronary artery disease.Note h/o substance abuse.Patientpreviously admitted with non-ST elevation myocardial infarction. Echocardiogram July 2017 showed ejection fraction 35-40% and grade 2 diastolic dysfunction. Cardiac catheterization July 2017 showed mild LV systolic dysfunction, [id number] second marginal. Patient had PCI of the second marginal with a drug-eluting stent. Patient had cough with ACEI. Nuclear study February 2019 showed ejection fraction 34%, prior inferior infarct but no ischemia. Echocardiogram February 2020 showed mild LV dysfunction with ejection fraction 45 to 50%, mild left ventricular hypertrophy, grade 1 diastolic dysfunction.  Since last seen,the patient has dyspnea with more extreme activities but not with routine activities. It is relieved with rest. It is not associated with chest pain. There is no orthopnea, PND or pedal edema. There is no syncope or palpitations. There is no exertional chest pain.   Current Outpatient Medications  Medication Sig Dispense Refill  . aspirin EC 81 MG tablet Take 1 tablet (81 mg total) by mouth 2 (two) times daily. For DVT prophylaxis for 30 days after surgery. (Patient taking differently: Take 81 mg by mouth once. ) 60 tablet 0  . atorvastatin (LIPITOR) 80 MG tablet TAKE 1 TABLET BY MOUTH EVERY EVENING AT 6 PM. keep office visit 90 tablet 3  . carvedilol (COREG) 6.25 MG tablet TAKE 1 TABLET BY MOUTH 2 TIMES DAILY 180 tablet 3  . chlorthalidone (HYGROTON) 25 MG tablet Take 0.5 tablets (12.5 mg total) by mouth daily. 15 tablet 3  . D3 SUPER STRENGTH 2000 units CAPS Take 2,000 Units by mouth daily.  0  . losartan (COZAAR) 50 MG tablet Take 1 tablet (50 mg total) by mouth daily. 90 tablet 3  . nitroGLYCERIN (NITROSTAT) 0.4 MG SL tablet Place 1 tablet (0.4 mg total) under the tongue every 5 (five) minutes as needed for chest pain. 25 tablet 3  . pantoprazole (PROTONIX) 20 MG tablet Take 1 tablet (20 mg total) by mouth daily.  30 tablet 3   No current facility-administered medications for this visit.     Past Medical History:  Diagnosis Date  . CAD (coronary artery disease) cardiologist-- dr Stanford Breed   a. 03/2016 NSTEMI/PCI: LM nl, LAD nl, RI nl, LCX nl, OM2 99 ( 2.75 x 16 Synergy DES), RCA nl, EF 45-50%.  Marland Kitchen ETOH abuse   . History of cocaine abuse ([place])    per pt none since 2017  . History of non-ST elevation myocardial infarction (NSTEMI) 03/14/2016   s/p  PCI and DES x1  . Hyperlipidemia   . Hypertensive heart disease   . Ischemic cardiomyopathy    a. 03/2016 Echo: EF 35-40%, diff H, sev inflat HK, Gr2 DD;  b. 03/2016 EF 45-50% by LV gram.;  echo 10-24-2018, ef 45-50%  . OA (osteoarthritis)    left knee  . S/P drug eluting coronary stent placement 03/14/2016   DES x1 to OM1  . Tobacco abuse   . Wears dentures    upper  . Wears partial dentures    lower    Past Surgical History:  Procedure Laterality Date  . CARDIAC CATHETERIZATION N/A 03/14/2016   Procedure: Left Heart Cath and Coronary Angiography;  Surgeon: Burnell Blanks, MD;  Location: [facility];  Service: Cardiovascular;  Laterality: N/A;  . LACERATION REPAIR  age 43   face around eye  . PARTIAL KNEE ARTHROPLASTY Left 11/13/2018   Procedure: UNICOMPARTMENTAL KNEE;  Surgeon: Renette Butters, MD;  Location: [facility];  Service: Orthopedics;  Laterality: Left;  Social History   Socioeconomic History  . Marital status: Legally Separated    Spouse name: Not on file  . Number of children: 8  . Years of education: Not on file  . Highest education level: Not on file  Occupational History  . Not on file  Tobacco Use  . Smoking status: Current Some Day Smoker    Packs/day: 1.00    Years: 35.00    Pack years: 35.00    Types: Cigarettes  . Smokeless tobacco: Never Used  Substance and Sexual Activity  . Alcohol use: Yes    Alcohol/week: 0.0 standard drinks    Comment: pint liquor (gin) daily; 11-06-2018 last two days had a  airplane bottle each daily, and on weekend one beer and couple of drink  . Drug use: Not Currently    Comment: 11-06-2018 per pt last cocaine "when I had my heart attack"  2017  . Sexual activity: Not on file  Other Topics Concern  . Not on file  Social History Narrative  . Not on file   Social Determinants of Health   Financial Resource Strain:   . Difficulty of Paying Living Expenses:   Food Insecurity:   . Worried About Charity fundraiser in the Last Year:   . Arboriculturist in the Last Year:   Transportation Needs:   . Film/video editor (Medical):   Marland Kitchen Lack of Transportation (Non-Medical):   Physical Activity:   . Days of Exercise per Week:   . Minutes of Exercise per Session:   Stress:   . Feeling of Stress :   Social Connections:   . Frequency of Communication with Friends and Family:   . Frequency of Social Gatherings with Friends and Family:   . Attends Religious Services:   . Active Member of Clubs or Organizations:   . Attends Archivist Meetings:   Marland Kitchen Marital Status:   Intimate Partner Violence:   . Fear of Current or Ex-Partner:   . Emotionally Abused:   Marland Kitchen Physically Abused:   . Sexually Abused:     Family History  Problem Relation Age of Onset  . Heart attack Father   . Heart attack Sister     ROS: ankle and wrist pain but no fevers or chills, productive cough, hemoptysis, dysphasia, odynophagia, melena, hematochezia, dysuria, hematuria, rash, seizure activity, orthopnea, PND, pedal edema, claudication. Remaining systems are negative.  Physical Exam: Well-developed well-nourished in no acute distress.  Skin is warm and dry.  HEENT is normal.  Neck is supple.  Chest is clear to auscultation with normal expansion.  Cardiovascular exam is regular rate and rhythm.  Abdominal exam nontender or distended. No masses palpated. Extremities show no edema. neuro grossly intact   A/P  1 coronary artery disease-patient has not had chest pain.   Continue medical therapy with aspirin and statin.  2 ischemic cardiomyopathy-mildly reduced LV function on most recent echocardiogram.  Continue beta-blocker and ARB.  Check potassium and renal function.  3 hyperlipidemia-continue statin.  Will check lipids and liver.  4 hypertension-blood pressure controlled.  Continue present medications.  5 tobacco abuse-patient counseled on discontinuing.  6 history of substance abuse-patient has avoided by his report.  Kirk Ruths, MD

## 2020-01-09 ENCOUNTER — Other Ambulatory Visit: Payer: Self-pay

## 2020-01-09 ENCOUNTER — Ambulatory Visit (INDEPENDENT_AMBULATORY_CARE_PROVIDER_SITE_OTHER): Payer: Medicaid Other | Admitting: Cardiology

## 2020-01-09 ENCOUNTER — Encounter: Payer: Self-pay | Admitting: Cardiology

## 2020-01-09 VITALS — BP 124/72 | HR 80 | Ht 65.0 in | Wt 174.0 lb

## 2020-01-09 DIAGNOSIS — E7800 Pure hypercholesterolemia, unspecified: Secondary | ICD-10-CM

## 2020-01-09 DIAGNOSIS — I255 Ischemic cardiomyopathy: Secondary | ICD-10-CM | POA: Diagnosis not present

## 2020-01-09 DIAGNOSIS — I10 Essential (primary) hypertension: Secondary | ICD-10-CM

## 2020-01-09 DIAGNOSIS — I2510 Atherosclerotic heart disease of native coronary artery without angina pectoris: Secondary | ICD-10-CM | POA: Diagnosis not present

## 2020-01-09 DIAGNOSIS — Z720 Tobacco use: Secondary | ICD-10-CM

## 2020-01-09 NOTE — Patient Instructions (Signed)
Medication Instructions:  NO CHANGE *If you need a refill on your cardiac medications before your next appointment, please call your pharmacy*   Lab Work: Your physician recommends that you return for lab work PRIOR TO EATING  If you have labs (blood work) drawn today and your tests are completely normal, you will receive your results only by: Marland Kitchen MyChart Message (if you have MyChart) OR . A paper copy in the mail If you have any lab test that is abnormal or we need to change your treatment, we will call you to review the results.   Follow-Up: At [facility], you and your health needs are our priority.  As part of our continuing mission to provide you with exceptional heart care, we have created designated Provider Care Teams.  These Care Teams include your primary Cardiologist (physician) and Advanced Practice Providers (APPs -  Physician Assistants and Nurse Practitioners) who all work together to provide you with the care you need, when you need it.  We recommend signing up for the patient portal called "MyChart".  Sign up information is provided on this After Visit Summary.  MyChart is used to connect with patients for Virtual Visits (Telemedicine).  Patients are able to view lab/test results, encounter notes, upcoming appointments, etc.  Non-urgent messages can be sent to your provider as well.   To learn more about what you can do with MyChart, go to [email or web address].    Your next appointment:   12 month(s)  The format for your next appointment:   Either In Person or Virtual  Provider:   You may see Kirk Ruths, MD or one of the following Advanced Practice Providers on your designated Care Team:    Kerin Ransom, PA-C  [place], [place]  Coletta Memos, [place]

## 2020-01-11 ENCOUNTER — Ambulatory Visit: Payer: Medicaid Other | Attending: Critical Care Medicine

## 2020-01-11 DIAGNOSIS — Z23 Encounter for immunization: Secondary | ICD-10-CM

## 2020-01-11 NOTE — Progress Notes (Signed)
   Covid-19 Vaccination Clinic  Name:  ROGERS REGIMBAL    MRN: [id number] DOB: 1956-05-04  01/11/2020  Mr. Nakayama was observed post Covid-19 immunization for 15 minutes without incident. He was provided with Vaccine Information Sheet and instruction to access the V-Safe system.   Mr. Nickol was instructed to call 911 with any severe reactions post vaccine: Marland Kitchen Difficulty breathing  . Swelling of face and throat  . A fast heartbeat  . A bad rash all over body  . Dizziness and weakness   Immunizations Administered    Name Date Dose VIS Date Route   Moderna COVID-19 Vaccine 01/11/2020 11:01 AM 0.5 mL 08/2019 Intramuscular   Manufacturer: Moderna   LotFP:[id number]   BrandonBE:[id number]

## 2020-01-14 ENCOUNTER — Telehealth: Payer: Self-pay

## 2020-01-14 NOTE — Telephone Encounter (Signed)
   Primary Cardiologist: Kirk Ruths, MD  Chart reviewed as part of pre-operative protocol coverage. He was last seen by Dr. Stanford Breed on 01/09/2020 and stable from CV standpoint. Given past medical history and time since last visit, based on ACC/AHA guidelines, DELMAS KALKOWSKI would be at acceptable risk for the planned procedure without further cardiovascular testing.   I will route this recommendation to the requesting party via Epic fax function and remove from pre-op pool.  Please call with questions.  Phill Myron. Almee Pelphrey DNP, ANP, [place]  01/14/2020, 3:12 PM

## 2020-01-14 NOTE — Telephone Encounter (Signed)
   [facility] Pre-operative Risk Assessment    Request for surgical clearance:  1. What type of surgery is being performed? Left wrist arthroscopy with debridement as needed    2. When is this surgery scheduled? 01/31/20   3. What type of clearance is required (medical clearance vs. Pharmacy clearance to hold med vs. Both)?   4. Are there any medications that need to be held prior to surgery and how long? None    5. Practice name and name of physician performing surgery? [facility], Charlotte Crumb, MD   6. What is your office phone number [phone]   7.   What is your office fax number [phone] Attn: Cyndee Brightly, RN  8.   Anesthesia type (None, local, MAC, general) ? Axillary Block, MAC   Mendel Ryder 01/14/2020, 2:46 PM  _________________________________________________________________   (provider comments below)

## 2020-01-31 HISTORY — PX: WRIST SURGERY: SHX841

## 2020-02-03 ENCOUNTER — Other Ambulatory Visit: Payer: Self-pay | Admitting: Cardiology

## 2020-02-03 ENCOUNTER — Ambulatory Visit (AMBULATORY_SURGERY_CENTER): Payer: Self-pay | Admitting: *Deleted

## 2020-02-03 ENCOUNTER — Other Ambulatory Visit: Payer: Self-pay

## 2020-02-03 VITALS — Ht 65.0 in | Wt 173.0 lb

## 2020-02-03 DIAGNOSIS — Z1211 Encounter for screening for malignant neoplasm of colon: Secondary | ICD-10-CM

## 2020-02-03 MED ORDER — SUPREP BOWEL PREP KIT 17.5-3.13-1.6 GM/177ML PO SOLN: 1.0000 | Freq: Once | ORAL | 0 refills | Status: AC

## 2020-02-03 NOTE — Progress Notes (Signed)
No egg or soy allergy known to patient  No issues with past sedation with any surgeries  or procedures, no intubation problems  No diet pills per patient No home 02 use per patient  No blood thinners per patient  Pt denies issues with constipation  No A fib or A flutter  EMMI video sent to pt's e mail   Due to the COVID-19 pandemic we are asking patients to follow these guidelines. Please only bring one care partner. Please be aware that your care partner may wait in the car in the parking lot or if they feel like they will be too hot to wait in the car, they may wait in the lobby on the 4th floor. All care partners are required to wear a mask the entire time (we do not have any that we can provide them), they need to practice social distancing, and we will do a Covid check for all patient's and care partners when you arrive. Also we will check their temperature and your temperature. If the care partner waits in their car they need to stay in the parking lot the entire time and we will call them on their cell phone when the patient is ready for discharge so they can bring the car to the front of the building. Also all patient's will need to wear a mask into building.  Completed covid vaccines 01-11-2020 Pt had wrist surgery 01-31-2020- due to this being so recent , and colon 2 weeks away, RS colon to 03-20-20 Friday at 8 am- pt did get cardiac clearance in epic for wrist surgery

## 2020-02-11 ENCOUNTER — Encounter: Payer: Self-pay | Admitting: *Deleted

## 2020-02-17 ENCOUNTER — Encounter: Payer: Medicaid Other | Admitting: Gastroenterology

## 2020-02-18 LAB — COMPREHENSIVE METABOLIC PANEL
ALT: 12 IU/L (ref 0–44)
AST: 18 IU/L (ref 0–40)
Albumin/Globulin Ratio: 1.7 (ref 1.2–2.2)
Albumin: 4.3 g/dL (ref 3.8–4.8)
Alkaline Phosphatase: 72 IU/L (ref 48–121)
BUN/Creatinine Ratio: 14 (ref 10–24)
BUN: 15 mg/dL (ref 8–27)
Bilirubin Total: 0.6 mg/dL (ref 0.0–1.2)
CO2: 26 mmol/L (ref 20–29)
Calcium: 9.5 mg/dL (ref 8.6–10.2)
Chloride: 103 mmol/L (ref 96–106)
Creatinine, Ser: 1.07 mg/dL (ref 0.76–1.27)
GFR calc Af Amer: 86 mL/min/{1.73_m2} (ref >59)
GFR calc non Af Amer: 74 mL/min/{1.73_m2} (ref >59)
Globulin, Total: 2.5 g/dL (ref 1.5–4.5)
Glucose: 97 mg/dL (ref 65–99)
Potassium: 4.2 mmol/L (ref 3.5–5.2)
Sodium: 143 mmol/L (ref 134–144)
Total Protein: 6.8 g/dL (ref 6.0–8.5)

## 2020-02-18 LAB — LIPID PANEL
Chol/HDL Ratio: 3.3 ratio (ref 0.0–5.0)
Cholesterol, Total: 166 mg/dL (ref 100–199)
HDL: 51 mg/dL (ref >39)
LDL Chol Calc (NIH): 93 mg/dL (ref 0–99)
Triglycerides: 122 mg/dL (ref 0–149)
VLDL Cholesterol Cal: 22 mg/dL (ref 5–40)

## 2020-02-26 ENCOUNTER — Telehealth: Payer: Self-pay | Admitting: *Deleted

## 2020-02-26 DIAGNOSIS — E7800 Pure hypercholesterolemia, unspecified: Secondary | ICD-10-CM

## 2020-02-26 MED ORDER — EZETIMIBE 10 MG PO TABS: 10.0000 mg | ORAL_TABLET | Freq: Every day | ORAL | 3 refills | Status: DC

## 2020-02-26 NOTE — Telephone Encounter (Addendum)
-----   Message from Lelon Perla, MD sent at 02/19/2020  4:20 PM EDT ----- Add zetia 10 mg daily, lipids and liver 12 weeks Brian Crenshaw  Spoke with pt, Aware of dr Jacalyn Lefevre recommendations. New script sent to the pharmacy and Lab orders mailed to the pt

## 2020-03-03 ENCOUNTER — Other Ambulatory Visit: Payer: Self-pay | Admitting: General Practice

## 2020-03-20 ENCOUNTER — Encounter: Payer: Medicaid Other | Admitting: Gastroenterology

## 2020-03-30 ENCOUNTER — Encounter: Payer: Medicaid Other | Admitting: Gastroenterology

## 2020-03-30 ENCOUNTER — Encounter: Payer: Self-pay | Admitting: Gastroenterology

## 2020-03-31 ENCOUNTER — Other Ambulatory Visit: Payer: Self-pay | Admitting: General Practice

## 2020-04-14 ENCOUNTER — Other Ambulatory Visit: Payer: Self-pay

## 2020-04-14 ENCOUNTER — Ambulatory Visit (AMBULATORY_SURGERY_CENTER): Payer: Self-pay | Admitting: *Deleted

## 2020-04-14 VITALS — Ht 66.0 in | Wt 171.8 lb

## 2020-04-14 DIAGNOSIS — Z1211 Encounter for screening for malignant neoplasm of colon: Secondary | ICD-10-CM

## 2020-04-14 MED ORDER — SUPREP BOWEL PREP KIT 17.5-3.13-1.6 GM/177ML PO SOLN: 1.0000 | Freq: Once | ORAL | 0 refills | Status: AC

## 2020-04-14 NOTE — Progress Notes (Signed)
Patient denies any allergies to egg or soy products. Patient denies complications with anesthesia/sedation.  Patient denies oxygen use at home and denies diet medications. Emmi instructions for colonoscopy explained and given to patient.  Patient had both covid vaccinations, last one in 01/2020.

## 2020-04-27 ENCOUNTER — Other Ambulatory Visit: Payer: Self-pay

## 2020-04-27 ENCOUNTER — Encounter: Payer: Self-pay | Admitting: Gastroenterology

## 2020-04-27 ENCOUNTER — Ambulatory Visit (AMBULATORY_SURGERY_CENTER): Payer: Medicaid Other | Admitting: Gastroenterology

## 2020-04-27 VITALS — BP 126/79 | HR 55 | Temp 98.4°F | Resp 16 | Ht 66.0 in | Wt 171.8 lb

## 2020-04-27 DIAGNOSIS — Z1211 Encounter for screening for malignant neoplasm of colon: Secondary | ICD-10-CM | POA: Diagnosis not present

## 2020-04-27 DIAGNOSIS — D120 Benign neoplasm of cecum: Secondary | ICD-10-CM

## 2020-04-27 DIAGNOSIS — D125 Benign neoplasm of sigmoid colon: Secondary | ICD-10-CM

## 2020-04-27 DIAGNOSIS — D123 Benign neoplasm of transverse colon: Secondary | ICD-10-CM

## 2020-04-27 DIAGNOSIS — D124 Benign neoplasm of descending colon: Secondary | ICD-10-CM | POA: Diagnosis not present

## 2020-04-27 MED ORDER — SODIUM CHLORIDE 0.9 % IV SOLN: 500.0000 mL | Freq: Once | INTRAVENOUS | Status: DC

## 2020-04-27 NOTE — Progress Notes (Signed)
Called to room to assist during endoscopic procedure.  Patient ID and intended procedure confirmed with present staff. Received instructions for my participation in the procedure from the performing physician.  

## 2020-04-27 NOTE — Op Note (Signed)
[facility] Patient Name: Lucas Stark Procedure Date: 04/27/2020 10:50 AM MRN: [id number] Endoscopist: Mallie Mussel L. Loletha Carrow , MD Age: 64 Referring MD:  Date of Birth: 1955-09-13 Gender: Male Account #: [id number] Procedure:                Colonoscopy Indications:              Screening for colorectal malignant neoplasm, This                            is the patient's first colonoscopy Medicines:                Monitored Anesthesia Care Procedure:                Pre-Anesthesia Assessment:                           - Prior to the procedure, a History and Physical                            was performed, and patient medications and                            allergies were reviewed. The patient's tolerance of                            previous anesthesia was also reviewed. The risks                            and benefits of the procedure and the sedation                            options and risks were discussed with the patient.                            All questions were answered, and informed consent                            was obtained. Prior Anticoagulants: The patient has                            taken no previous anticoagulant or antiplatelet                            agents except for aspirin. ASA Grade Assessment:                            III - A patient with severe systemic disease. After                            reviewing the risks and benefits, the patient was                            deemed in satisfactory condition to undergo the  procedure.                           After obtaining informed consent, the colonoscope                            was passed under direct vision. Throughout the                            procedure, the patient's blood pressure, pulse, and                            oxygen saturations were monitored continuously. The                            Colonoscope was introduced through the anus and                             advanced to the the cecum, identified by                            appendiceal orifice and ileocecal valve. The                            colonoscopy was performed without difficulty. The                            patient tolerated the procedure well. The quality                            of the bowel preparation was good. The ileocecal                            valve, appendiceal orifice, and rectum were                            photographed. The bowel preparation used was SUPREP. Scope In: 10:57:51 AM Scope Out: 59:56:38 AM Scope Withdrawal Time: 0 hours 15 minutes 31 seconds  Total Procedure Duration: 0 hours 18 minutes 18 seconds  Findings:                 The perianal and digital rectal examinations were                            normal.                           Four flat and sessile polyps were found in the                            rectum, proximal descending colon, distal                            transverse colon and ileocecal valve. The polyps  were diminutive in size. These polyps were removed                            with a cold snare. Resection and retrieval were                            complete.                           The exam was otherwise without abnormality on                            direct and retroflexion views. Complications:            No immediate complications. Estimated Blood Loss:     Estimated blood loss was minimal. Impression:               - Four diminutive polyps in the rectum, in the                            proximal descending colon, in the distal transverse                            colon and at the ileocecal valve, removed with a                            cold snare. Resected and retrieved.                           - The examination was otherwise normal on direct                            and retroflexion views. Recommendation:           - Patient has a contact number available for                             emergencies. The signs and symptoms of potential                            delayed complications were discussed with the                            patient. Return to normal activities tomorrow.                            Written discharge instructions were provided to the                            patient.                           - Resume previous diet.                           - Continue present medications.                           -  Await pathology results.                           - Repeat colonoscopy is recommended for                            surveillance. The colonoscopy date will be                            determined after pathology results from today's                            exam become available for review. Salam Micucci L. Loletha Carrow, MD 04/27/2020 11:20:50 AM This report has been signed electronically.

## 2020-04-27 NOTE — Progress Notes (Signed)
To PACU, VSS. Report to [email or web address] 

## 2020-04-27 NOTE — Progress Notes (Signed)
Pt. Reports no change in his medical or surgical history since his pre-visit 04/14/2020.

## 2020-04-27 NOTE — Patient Instructions (Signed)
Information on polyps given to you today.  Await pathology results.  Resume previous diet and medications.  YOU HAD AN ENDOSCOPIC PROCEDURE TODAY AT THE [facility]:   Refer to the procedure report that was given to you for any specific questions about what was found during the examination.  If the procedure report does not answer your questions, please call your gastroenterologist to clarify.  If you requested that your care partner not be given the details of your procedure findings, then the procedure report has been included in a sealed envelope for you to review at your convenience later.  YOU SHOULD EXPECT: Some feelings of bloating in the abdomen. Passage of more gas than usual.  Walking can help get rid of the air that was put into your GI tract during the procedure and reduce the bloating. If you had a lower endoscopy (such as a colonoscopy or flexible sigmoidoscopy) you may notice spotting of blood in your stool or on the toilet paper. If you underwent a bowel prep for your procedure, you may not have a normal bowel movement for a few days.  Please Note:  You might notice some irritation and congestion in your nose or some drainage.  This is from the oxygen used during your procedure.  There is no need for concern and it should clear up in a day or so.  SYMPTOMS TO REPORT IMMEDIATELY:   Following lower endoscopy (colonoscopy or flexible sigmoidoscopy):  Excessive amounts of blood in the stool  Significant tenderness or worsening of abdominal pains  Swelling of the abdomen that is new, acute  Fever of 100F or higher   For urgent or emergent issues, a gastroenterologist can be reached at any hour by calling [phone]. Do not use MyChart messaging for urgent concerns.    DIET:  We do recommend a small meal at first, but then you may proceed to your regular diet.  Drink plenty of fluids but you should avoid alcoholic beverages for 24 hours.  ACTIVITY:  You should  plan to take it easy for the rest of today and you should NOT DRIVE or use heavy machinery until tomorrow (because of the sedation medicines used during the test).    FOLLOW UP: Our staff will call the number listed on your records 48-72 hours following your procedure to check on you and address any questions or concerns that you may have regarding the information given to you following your procedure. If we do not reach you, we will leave a message.  We will attempt to reach you two times.  During this call, we will ask if you have developed any symptoms of COVID 19. If you develop any symptoms (ie: fever, flu-like symptoms, shortness of breath, cough etc.) before then, please call [phone].  If you test positive for Covid 19 in the 2 weeks post procedure, please call and report this information to us.    If any biopsies were taken you will be contacted by phone or by letter within the next 1-3 weeks.  Please call us at [phone] if you have not heard about the biopsies in 3 weeks.    SIGNATURES/CONFIDENTIALITY: You and/or your care partner have signed paperwork which will be entered into your electronic medical record.  These signatures attest to the fact that that the information above on your After Visit Summary has been reviewed and is understood.  Full responsibility of the confidentiality of this discharge information lies with you and/or your care-partner. 

## 2020-04-29 ENCOUNTER — Telehealth: Payer: Self-pay | Admitting: *Deleted

## 2020-04-29 ENCOUNTER — Telehealth: Payer: Self-pay

## 2020-04-29 ENCOUNTER — Encounter: Payer: Self-pay | Admitting: Gastroenterology

## 2020-04-29 NOTE — Telephone Encounter (Signed)
Attempted to return pts call with no answer.  Will try again with 2nd round of call backs.

## 2020-04-29 NOTE — Telephone Encounter (Signed)
Pt left msg with answering service stating that he was returning your call. Pls call him again.

## 2020-04-29 NOTE — Telephone Encounter (Signed)
°  Follow up Call-  Call back number 04/27/2020  Post procedure Call Back phone  # [phone]  Permission to leave phone message Yes  Some recent data might be hidden     No answer at 2nd attempt follow up phone call.  Left message on voicemail.

## 2020-04-29 NOTE — Telephone Encounter (Signed)
Called [phone] and left a message we tried to reach pt for a follow up call. maw

## 2020-09-29 ENCOUNTER — Telehealth: Payer: Self-pay | Admitting: Cardiology

## 2020-09-29 NOTE — Telephone Encounter (Addendum)
Returned call to patient, he would like to try to quit smoking.   He states he has tried nicotine patches in the past without success.  He would to try medication to help him quit.   Patient reports PCP moved out of town-suppose to be getting reestablished with another provider in office but has not seen them yet.     Advised would send to Dr. Stanford Breed to review.

## 2020-09-29 NOTE — Telephone Encounter (Signed)
     Pt c/o medication issue:  1. Name of Medication: chantix  2. How are you currently taking this medication (dosage and times per day)?   3. Are you having a reaction (difficulty breathing--STAT)?   4. What is your medication issue? Pt said the pharmacy [place] told him to call Dr. Stanford Breed to prescribed him chantix, but different brand. He said he [place] the name and Dr. Stanford Breed knows it

## 2020-09-29 NOTE — Telephone Encounter (Signed)
Would try nicotine patches again Lucas Stark

## 2020-09-30 ENCOUNTER — Other Ambulatory Visit: Payer: Self-pay

## 2020-09-30 ENCOUNTER — Encounter: Payer: Self-pay | Admitting: Internal Medicine

## 2020-09-30 ENCOUNTER — Encounter (HOSPITAL_COMMUNITY): Payer: Self-pay

## 2020-09-30 ENCOUNTER — Telehealth: Payer: Self-pay | Admitting: Cardiology

## 2020-09-30 ENCOUNTER — Emergency Department (HOSPITAL_COMMUNITY)
Admission: EM | Admit: 2020-09-30 | Discharge: 2020-10-01 | Disposition: A | Discharge status: Home / Self Care | Payer: Medicaid Other | Attending: Emergency Medicine | Admitting: Emergency Medicine

## 2020-09-30 ENCOUNTER — Emergency Department (HOSPITAL_COMMUNITY): Payer: Medicaid Other

## 2020-09-30 ENCOUNTER — Ambulatory Visit (INDEPENDENT_AMBULATORY_CARE_PROVIDER_SITE_OTHER): Payer: Medicaid Other | Admitting: Internal Medicine

## 2020-09-30 VITALS — BP 150/94 | HR 55 | Ht 66.0 in | Wt 185.4 lb

## 2020-09-30 DIAGNOSIS — I119 Hypertensive heart disease without heart failure: Secondary | ICD-10-CM | POA: Diagnosis not present

## 2020-09-30 DIAGNOSIS — Z7982 Long term (current) use of aspirin: Secondary | ICD-10-CM | POA: Diagnosis not present

## 2020-09-30 DIAGNOSIS — K219 Gastro-esophageal reflux disease without esophagitis: Secondary | ICD-10-CM | POA: Diagnosis not present

## 2020-09-30 DIAGNOSIS — R079 Chest pain, unspecified: Secondary | ICD-10-CM | POA: Insufficient documentation

## 2020-09-30 DIAGNOSIS — I2510 Atherosclerotic heart disease of native coronary artery without angina pectoris: Secondary | ICD-10-CM | POA: Diagnosis not present

## 2020-09-30 DIAGNOSIS — Z79899 Other long term (current) drug therapy: Secondary | ICD-10-CM | POA: Insufficient documentation

## 2020-09-30 DIAGNOSIS — Z5321 Procedure and treatment not carried out due to patient leaving prior to being seen by health care provider: Secondary | ICD-10-CM | POA: Insufficient documentation

## 2020-09-30 DIAGNOSIS — Z96652 Presence of left artificial knee joint: Secondary | ICD-10-CM | POA: Insufficient documentation

## 2020-09-30 DIAGNOSIS — F17210 Nicotine dependence, cigarettes, uncomplicated: Secondary | ICD-10-CM | POA: Insufficient documentation

## 2020-09-30 DIAGNOSIS — E7800 Pure hypercholesterolemia, unspecified: Secondary | ICD-10-CM

## 2020-09-30 DIAGNOSIS — I255 Ischemic cardiomyopathy: Secondary | ICD-10-CM

## 2020-09-30 DIAGNOSIS — Z9861 Coronary angioplasty status: Secondary | ICD-10-CM

## 2020-09-30 DIAGNOSIS — Z955 Presence of coronary angioplasty implant and graft: Secondary | ICD-10-CM | POA: Insufficient documentation

## 2020-09-30 DIAGNOSIS — I10 Essential (primary) hypertension: Secondary | ICD-10-CM | POA: Diagnosis not present

## 2020-09-30 LAB — BASIC METABOLIC PANEL
Anion gap: 11 (ref 5–15)
BUN: 10 mg/dL (ref 8–23)
CO2: 26 mmol/L (ref 22–32)
Calcium: 9.2 mg/dL (ref 8.9–10.3)
Chloride: 102 mmol/L (ref 98–111)
Creatinine, Ser: 1.22 mg/dL (ref 0.61–1.24)
GFR, Estimated: >60 mL/min (ref >60)
Glucose, Bld: 99 mg/dL (ref 70–99)
Potassium: 4.2 mmol/L (ref 3.5–5.1)
Sodium: 139 mmol/L (ref 135–145)

## 2020-09-30 LAB — CBC
HCT: 47.8 % (ref 39.0–52.0)
Hemoglobin: 16.6 g/dL (ref 13.0–17.0)
MCH: 31.9 pg (ref 26.0–34.0)
MCHC: 34.7 g/dL (ref 30.0–36.0)
MCV: 91.7 fL (ref 80.0–100.0)
Platelets: 203 10*3/uL (ref 150–400)
RBC: 5.21 MIL/uL (ref 4.22–5.81)
RDW: 13.5 % (ref 11.5–15.5)
WBC: 4.9 10*3/uL (ref 4.0–10.5)
nRBC: 0 % (ref 0.0–0.2)

## 2020-09-30 LAB — TROPONIN I (HIGH SENSITIVITY): Troponin I (High Sensitivity): 5 ng/L (ref <18)

## 2020-09-30 MED ORDER — NITROGLYCERIN 0.4 MG SL SUBL
0.4000 mg | SUBLINGUAL_TABLET | SUBLINGUAL | Status: DC | PRN
Start: 2020-09-30 — End: 2022-05-17
  Administered 2020-09-30: 0.4 mg via SUBLINGUAL

## 2020-09-30 NOTE — Telephone Encounter (Signed)
Pt c/o of Chest Pain: STAT if CP now or developed within 24 hours  1. Are you having CP right now? Yes.  2. Are you experiencing any other symptoms (ex. SOB, nausea, vomiting, sweating)? Patient states he's also having SOB, when he bends down.  3. How long have you been experiencing CP? A week.  4. Is your CP continuous or coming and going? Coming and going, more constant when he lays down for bed.  5. Have you taken Nitroglycerin? No.   Patient states that he's experiencing SOB and chest pains and would like to be seen today if possible. Please advise. ?

## 2020-09-30 NOTE — Telephone Encounter (Signed)
Spoke to patient he stated he continues to have chest pain off and on.No chest pain at present.Stated he would like to be seen today.No extender appointments available.Appointment scheduled with DOD Dr.Acharya this afternoon at 3:00 pm.

## 2020-09-30 NOTE — Telephone Encounter (Signed)
Spoke to patient he stated he has been having chest pain off and on for the past week.Stated appointment was scheduled with Kerin Ransom PA tomorrow 1/20 at 11:15 am.Stated he cannot take Nicotine patches causes him to feel funny.Advised to discuss other options with Lurena Joiner at appointment tomorrow.

## 2020-09-30 NOTE — Progress Notes (Signed)
Cardiology Office Note:    Date:  09/30/2020   ID:  Lucas Stark, DOB 1956/04/04, MRN [id number]  PCP:  Vassie Moment, MD  Cardiologist:  Kirk Ruths, MD  Electrophysiologist:  None   Referring MD: Vassie Moment, MD   Chief Complaint/Reason for Referral: Acute care visit: chest pain  History of Present Illness:    Lucas Stark is a 65 y.o. male patient of my partner Dr. Stanford Breed, with a history of coronary artery disease, ischemic cardiomyopathy, HLD, HTN, and substance abuse. Patient previously admitted with non-ST elevation myocardial infarction. Echocardiogram July 2017 showed ejection fraction 35-40% and grade 2 diastolic dysfunction. Cardiac catheterization July 2017 showed mild LV systolic dysfunction, [id number] second marginal. Patient had PCI of the second marginal with a drug-eluting stent. Patient had cough with ACEI. Nuclear study February 2019 showed ejection fraction 34%, prior inferior infarct but no ischemia.  Echocardiogram February 2020 showed mild LV dysfunction with ejection fraction 45 to 50%, mild left ventricular hypertrophy, grade 1 diastolic dysfunction.   Currently having chest pain, substernal chest pain, comes and goes and feels like last MI. BP elevated today. Has not tried nitroglycerin.  Feels that he pulled a muscle on his right side working, that pain has since resolved but now he feels a substernal chest pain reminiscent of his prior MI.  Also has some shortness of breath and has a pending COVID test.  Denies palpitations, PND, orthopnea, leg swelling, syncope, lightheadedness, dizziness.   Past Medical History:  Diagnosis Date  . CAD (coronary artery disease) cardiologist-- dr Stanford Breed   a. 03/2016 NSTEMI/PCI: LM nl, LAD nl, RI nl, LCX nl, OM2 99 ( 2.75 x 16 Synergy DES), RCA nl, EF 45-50%.  Marland Kitchen ETOH abuse   . GERD (gastroesophageal reflux disease)   . History of cocaine abuse ([place])    per pt none since 2017  . History of non-ST elevation myocardial  infarction (NSTEMI) 03/14/2016   s/p  PCI and DES x1  . Hyperlipidemia   . Hypertension   . Hypertensive heart disease   . Ischemic cardiomyopathy    a. 03/2016 Echo: EF 35-40%, diff H, sev inflat HK, Gr2 DD;  b. 03/2016 EF 45-50% by LV gram.;  echo 10-24-2018, ef 45-50%  . Myocardial infarction [facility]) 2017   NSTEMI   . OA (osteoarthritis)    left knee  . S/P drug eluting coronary stent placement 03/14/2016   DES x1 to OM1  . Tobacco abuse   . Wears dentures    upper  . Wears partial dentures    lower    Past Surgical History:  Procedure Laterality Date  . CARDIAC CATHETERIZATION N/A 03/14/2016   Procedure: Left Heart Cath and Coronary Angiography;  Surgeon: Burnell Blanks, MD;  Location: [place] [facility];  Service: Cardiovascular;  Laterality: N/A;  . CORONARY ANGIOPLASTY WITH STENT PLACEMENT    . FACIAL COSMETIC SURGERY     at age 74 yrs old  . LACERATION REPAIR  age 65   face around eye  . PARTIAL KNEE ARTHROPLASTY Left 11/13/2018   Procedure: UNICOMPARTMENTAL KNEE;  Surgeon: Renette Butters, MD;  Location: [facility];  Service: Orthopedics;  Laterality: Left;  . WRIST SURGERY Left 01/31/2020    Current Medications: Current Meds  Medication Sig  . aspirin EC 81 MG tablet Take 81 mg by mouth daily. Swallow whole.  Marland Kitchen atorvastatin (LIPITOR) 80 MG tablet TAKE 1 TABLET BY MOUTH EVERY EVENING AT 6 PM. keep office visit  .  carvedilol (COREG) 6.25 MG tablet TAKE 1 TABLET BY MOUTH 2 TIMES DAILY  . chlorthalidone (HYGROTON) 25 MG tablet TAKE 1/2 TABLET BY MOUTH EVERY DAY  . D3 SUPER STRENGTH 2000 units CAPS Take 2,000 Units by mouth daily.  Marland Kitchen ezetimibe (ZETIA) 10 MG tablet Take 1 tablet (10 mg total) by mouth daily.  Marland Kitchen losartan (COZAAR) 50 MG tablet Take 1 tablet (50 mg total) by mouth daily.  . nitroGLYCERIN (NITROSTAT) 0.4 MG SL tablet Place 1 tablet (0.4 mg total) under the tongue every 5 (five) minutes as needed for chest pain.  . pantoprazole (PROTONIX) 20 MG tablet TAKE 1  TABLET BY MOUTH EVERY DAY  . sildenafil (REVATIO) 20 MG tablet Take 20 mg by mouth daily as needed.   Current Facility-Administered Medications for the 09/30/20 encounter (Office Visit) with Elouise Munroe, MD  Medication  . nitroGLYCERIN (NITROSTAT) SL tablet 0.4 mg     Allergies:   Other   Social History   Tobacco Use  . Smoking status: Current Some Day Smoker    Packs/day: 1.00    Years: 35.00    Pack years: 35.00    Types: Cigarettes  . Smokeless tobacco: Never Used  . Tobacco comment: some days less   Vaping Use  . Vaping Use: Never used  Substance Use Topics  . Alcohol use: Yes    Alcohol/week: 0.0 standard drinks    Comment: pint liquor (gin) daily; 11-06-2018 last two days had a airplane bottle each daily, and on weekend one beer and couple of drink  . Drug use: Not Currently    Comment: 11-06-2018 per pt last cocaine "when I had my heart attack"  2017     Family History: The patient's family history includes Colon polyps in his maternal uncle; Heart attack in his father and sister. There is no history of Colon cancer, Esophageal cancer, Stomach cancer, or Rectal cancer.  ROS:   Please see the history of present illness.    All other systems reviewed and are negative.  EKGs/Labs/Other Studies Reviewed:    The following studies were reviewed today:  EKG:  Sinus bradycardia 55 bpm, nonspecific T wave abnl.    Recent Labs: 02/18/2020: ALT 12; BUN 15; Creatinine, Ser 1.07; Potassium 4.2; Sodium 143  Recent Lipid Panel    Component Value Date/Time   CHOL 166 02/18/2020 1024   TRIG 122 02/18/2020 1024   HDL 51 02/18/2020 1024   CHOLHDL 3.3 02/18/2020 1024   CHOLHDL 3.1 03/12/2016 1312   VLDL 8 03/12/2016 1312   LDLCALC 93 02/18/2020 1024    Physical Exam:    VS:  BP (!) 150/94   Pulse (!) 55   Ht 5\' 6"  (1.676 m)   Wt 185 lb 6.4 oz (84.1 kg)   BMI 29.92 kg/m     Wt Readings from Last 5 Encounters:  09/30/20 185 lb 6.4 oz (84.1 kg)  04/27/20 171  lb 12.8 oz (77.9 kg)  04/14/20 171 lb 12.8 oz (77.9 kg)  02/03/20 173 lb (78.5 kg)  01/09/20 174 lb (78.9 kg)    Constitutional: No acute distress Eyes: sclera non-icteric, normal conjunctiva and lids ENMT: normal dentition, moist mucous membranes Cardiovascular: regular rhythm, normal rate, no murmurs. S1 and S2 normal. Radial pulses normal bilaterally. No jugular venous distention.  Respiratory: clear to auscultation bilaterally GI : normal bowel sounds, soft and nontender. No distention.   MSK: extremities warm, well perfused. No edema.  NEURO: grossly nonfocal exam, moves all extremities. PSYCH:  alert and oriented x 3, normal mood and affect.   ASSESSMENT:    1. Chest pain of uncertain etiology   2. Coronary artery disease involving native coronary artery of native heart without angina pectoris   3. CAD S/P percutaneous coronary angioplasty   4. Cardiomyopathy, ischemic   5. Essential hypertension   6. Pure hypercholesterolemia    PLAN:    Chest pain of uncertain etiology - Plan: EKG 12-Lead, nitroGLYCERIN (NITROSTAT) SL tablet 0.4 mg Coronary artery disease involving native coronary artery of native heart without angina pectoris CAD S/P percutaneous coronary angioplasty Cardiomyopathy, ischemic Essential hypertension Pure hypercholesterolemia   The patient has waxing and waning active chest pain in the setting of a history of coronary artery disease with prior PCI and ischemic cardiomyopathy by last echo February 2020.  Given active chest pain in a patient with high probability of recurrent obstructive CAD, recommend evaluation in the emergency department.  He can then be evaluated with troponins and serial ECGs to evaluate for recurrent ischemia.  We will administer nitroglycerin in the office today for relief of chest pain since the patient has not taken any yet.  This may also help with blood pressure.  After administration of nitroglycerin the patient had near relief of  chest pain, suggesting ischemic etiology.  I have recommended that he present to the ER after observation after nitroglycerin.  He demonstrated stability prior to leaving the office.  Would also recommend repeat echocardiogram as part of this episode of care to reevaluate ejection fraction in the setting of dyspnea on exertion and chest pain.   Cherlynn Kaiser, MD   [facility]    Medication Adjustments/Labs and Tests Ordered: Current medicines are reviewed at length with the patient today.  Concerns regarding medicines are outlined above.   Orders Placed This Encounter  Procedures  . EKG 12-Lead    Meds ordered this encounter  Medications  . nitroGLYCERIN (NITROSTAT) SL tablet 0.4 mg    Patient Instructions  Medication Instructions:  1 DOSE OF NITROGLYCERIN SUBLINGUAL GIVEN  *If you need a refill on your cardiac medications before your next appointment, please call your pharmacy*  Follow-Up: At [facility], you and your health needs are our priority.  As part of our continuing mission to provide you with exceptional heart care, we have created designated Provider Care Teams.  These Care Teams include your primary Cardiologist (physician) and Advanced Practice Providers (APPs -  Physician Assistants and Nurse Practitioners) who all work together to provide you with the care you need, when you need it.  We recommend signing up for the patient portal called "MyChart".  Sign up information is provided on this After Visit Summary.  MyChart is used to connect with patients for Virtual Visits (Telemedicine).  Patients are able to view lab/test results, encounter notes, upcoming appointments, etc.  Non-urgent messages can be sent to your provider as well.   To learn more about what you can do with MyChart, go to [email or web address].    Your next appointment:   2-3 week(s)  The format for your next appointment:   In Person  Provider:   Cherlynn Kaiser,  MD  Other Instructions PLEASE REPORT TO [place]

## 2020-09-30 NOTE — Patient Instructions (Signed)
Medication Instructions:  1 DOSE OF NITROGLYCERIN SUBLINGUAL GIVEN  *If you need a refill on your cardiac medications before your next appointment, please call your pharmacy*  Follow-Up: At [facility], you and your health needs are our priority.  As part of our continuing mission to provide you with exceptional heart care, we have created designated Provider Care Teams.  These Care Teams include your primary Cardiologist (physician) and Advanced Practice Providers (APPs -  Physician Assistants and Nurse Practitioners) who all work together to provide you with the care you need, when you need it.  We recommend signing up for the patient portal called "MyChart".  Sign up information is provided on this After Visit Summary.  MyChart is used to connect with patients for Virtual Visits (Telemedicine).  Patients are able to view lab/test results, encounter notes, upcoming appointments, etc.  Non-urgent messages can be sent to your provider as well.   To learn more about what you can do with MyChart, go to [email or web address].    Your next appointment:   2-3 week(s)  The format for your next appointment:   In Person  Provider:   Cherlynn Kaiser, MD  Other Instructions PLEASE REPORT TO [place]

## 2020-09-30 NOTE — Addendum Note (Signed)
Addended by: Cherlynn Kaiser A on: 09/30/2020 09:23 PM   Modules accepted: Level of Service

## 2020-09-30 NOTE — ED Triage Notes (Signed)
Pt c.o chest pain for the past week, went to cardiologist office today and they sent him here for further eval. Took 1 home nitro pta with some relief of pain. Pt a.o

## 2020-10-01 ENCOUNTER — Other Ambulatory Visit: Payer: Self-pay

## 2020-10-01 ENCOUNTER — Encounter (HOSPITAL_COMMUNITY): Payer: Self-pay

## 2020-10-01 ENCOUNTER — Emergency Department (HOSPITAL_COMMUNITY)
Admission: EM | Admit: 2020-10-01 | Discharge: 2020-10-01 | Disposition: A | Discharge status: Home / Self Care | Payer: Medicaid Other | Source: Home / Self Care | Attending: Emergency Medicine | Admitting: Emergency Medicine

## 2020-10-01 ENCOUNTER — Ambulatory Visit: Payer: Medicaid Other | Admitting: Cardiology

## 2020-10-01 DIAGNOSIS — Z7982 Long term (current) use of aspirin: Secondary | ICD-10-CM | POA: Insufficient documentation

## 2020-10-01 DIAGNOSIS — I119 Hypertensive heart disease without heart failure: Secondary | ICD-10-CM | POA: Insufficient documentation

## 2020-10-01 DIAGNOSIS — R079 Chest pain, unspecified: Secondary | ICD-10-CM

## 2020-10-01 DIAGNOSIS — F17210 Nicotine dependence, cigarettes, uncomplicated: Secondary | ICD-10-CM | POA: Insufficient documentation

## 2020-10-01 DIAGNOSIS — Z96652 Presence of left artificial knee joint: Secondary | ICD-10-CM | POA: Insufficient documentation

## 2020-10-01 DIAGNOSIS — Z79899 Other long term (current) drug therapy: Secondary | ICD-10-CM | POA: Insufficient documentation

## 2020-10-01 DIAGNOSIS — K219 Gastro-esophageal reflux disease without esophagitis: Secondary | ICD-10-CM

## 2020-10-01 DIAGNOSIS — Z955 Presence of coronary angioplasty implant and graft: Secondary | ICD-10-CM | POA: Insufficient documentation

## 2020-10-01 DIAGNOSIS — I2510 Atherosclerotic heart disease of native coronary artery without angina pectoris: Secondary | ICD-10-CM | POA: Insufficient documentation

## 2020-10-01 LAB — TROPONIN I (HIGH SENSITIVITY): Troponin I (High Sensitivity): 4 ng/L (ref <18)

## 2020-10-01 MED ORDER — ALUM & MAG HYDROXIDE-SIMETH 200-200-20 MG/5ML PO SUSP
30.0000 mL | Freq: Once | ORAL | Status: AC
  Administered 2020-10-01: 30 mL via ORAL
  Filled 2020-10-01: qty 30

## 2020-10-01 MED ORDER — LIDOCAINE VISCOUS HCL 2 % MT SOLN
15.0000 mL | Freq: Once | OROMUCOSAL | Status: AC
  Administered 2020-10-01: 15 mL via ORAL
  Filled 2020-10-01: qty 15

## 2020-10-01 MED ORDER — SUCRALFATE 1 G PO TABS
1.0000 g | ORAL_TABLET | Freq: Three times a day (TID) | ORAL | 0 refills | Status: DC
Start: 2020-10-01 — End: 2021-11-19

## 2020-10-01 NOTE — ED Provider Notes (Signed)
[facility] EMERGENCY DEPARTMENT Provider Note   CSN: [id number] Arrival date & time: 10/01/20  [id number]     History Chief Complaint  Patient presents with  . Chest Pain    Lucas Stark is a 65 y.o. male.  HPI   Pt has had intermittent cp for the last two weeks.  It would come and go.  Mostly on the right side.  Flares up when lying on his side.  This morning he had some burning pain in the right side.  It also goes down the right shoulder.  He does have hx of CAD.  Some slight cough.  Feels like something in the throat.  No vomiting.  No diarrhea.  No leg swelling.  Past Medical History:  Diagnosis Date  . CAD (coronary artery disease) cardiologist-- dr Stanford Breed   a. 03/2016 NSTEMI/PCI: LM nl, LAD nl, RI nl, LCX nl, OM2 99 ( 2.75 x 16 Synergy DES), RCA nl, EF 45-50%.  Marland Kitchen ETOH abuse   . GERD (gastroesophageal reflux disease)   . History of cocaine abuse ([place])    per pt none since 2017  . History of non-ST elevation myocardial infarction (NSTEMI) 03/14/2016   s/p  PCI and DES x1  . Hyperlipidemia   . Hypertension   . Hypertensive heart disease   . Ischemic cardiomyopathy    a. 03/2016 Echo: EF 35-40%, diff H, sev inflat HK, Gr2 DD;  b. 03/2016 EF 45-50% by LV gram.;  echo 10-24-2018, ef 45-50%  . Myocardial infarction [facility]) 2017   NSTEMI   . OA (osteoarthritis)    left knee  . S/P drug eluting coronary stent placement 03/14/2016   DES x1 to OM1  . Tobacco abuse   . Wears dentures    upper  . Wears partial dentures    lower    Patient Active Problem List   Diagnosis Date Noted  . Primary localized osteoarthritis of knee 11/13/2018  . Primary osteoarthritis of left knee 10/24/2018  . Knee effusion, left 08/15/2018  . Hypertensive heart disease without CHF 03/15/2016  . Dyslipidemia, goal LDL below 70 03/15/2016  . CAD S/P percutaneous coronary angioplasty 03/15/2016  . Cardiomyopathy, ischemic 03/15/2016  . Status post coronary artery stent placement    . History of non-ST elevation myocardial infarction (NSTEMI) 03/12/2016  . Benign essential HTN 03/12/2016  . Tobacco abuse 03/12/2016  . Cocaine use 03/12/2016  . Alcohol abuse 03/12/2016    Past Surgical History:  Procedure Laterality Date  . CARDIAC CATHETERIZATION N/A 03/14/2016   Procedure: Left Heart Cath and Coronary Angiography;  Surgeon: Burnell Blanks, MD;  Location: [place] [facility];  Service: Cardiovascular;  Laterality: N/A;  . CORONARY ANGIOPLASTY WITH STENT PLACEMENT    . FACIAL COSMETIC SURGERY     at age 69 yrs old  . LACERATION REPAIR  age 18   face around eye  . PARTIAL KNEE ARTHROPLASTY Left 11/13/2018   Procedure: UNICOMPARTMENTAL KNEE;  Surgeon: Renette Butters, MD;  Location: [facility];  Service: Orthopedics;  Laterality: Left;  . WRIST SURGERY Left 01/31/2020       Family History  Problem Relation Age of Onset  . Heart attack Father   . Heart attack Sister   . Colon polyps Maternal Uncle   . Colon cancer Neg Hx   . Esophageal cancer Neg Hx   . Stomach cancer Neg Hx   . Rectal cancer Neg Hx     Social History   Tobacco  Use  . Smoking status: Current Some Day Smoker    Packs/day: 1.00    Years: 35.00    Pack years: 35.00    Types: Cigarettes  . Smokeless tobacco: Never Used  . Tobacco comment: some days less   Vaping Use  . Vaping Use: Never used  Substance Use Topics  . Alcohol use: Yes    Alcohol/week: 0.0 standard drinks    Comment: pint liquor (gin) daily; 11-06-2018 last two days had a airplane bottle each daily, and on weekend one beer and couple of drink  . Drug use: Not Currently    Comment: 11-06-2018 per pt last cocaine "when I had my heart attack"  2017    Home Medications Prior to Admission medications   Medication Sig Start Date End Date Taking? Authorizing Provider  sucralfate (CARAFATE) 1 g tablet Take 1 tablet (1 g total) by mouth 4 (four) times daily -  with meals and at bedtime. 10/01/20  Yes Dorie Rank, MD  aspirin  EC 81 MG tablet Take 81 mg by mouth daily. Swallow whole.    [provider]  atorvastatin (LIPITOR) 80 MG tablet TAKE 1 TABLET BY MOUTH EVERY EVENING AT 6 PM. keep office visit 10/04/19   Deberah Pelton, NP  carvedilol (COREG) 6.25 MG tablet TAKE 1 TABLET BY MOUTH 2 TIMES DAILY 02/07/20   Lelon Perla, MD  chlorthalidone (HYGROTON) 25 MG tablet TAKE 1/2 TABLET BY MOUTH EVERY DAY 03/03/20   Lelon Perla, MD  D3 SUPER STRENGTH 2000 units CAPS Take 2,000 Units by mouth daily. 08/24/17   [provider]  ezetimibe (ZETIA) 10 MG tablet Take 1 tablet (10 mg total) by mouth daily. 02/26/20 05/26/20  Lelon Perla, MD  losartan (COZAAR) 50 MG tablet Take 1 tablet (50 mg total) by mouth daily. 09/24/19   Deberah Pelton, NP  nitroGLYCERIN (NITROSTAT) 0.4 MG SL tablet Place 1 tablet (0.4 mg total) under the tongue every 5 (five) minutes as needed for chest pain. 10/10/17   Lendon Colonel, NP  pantoprazole (PROTONIX) 20 MG tablet TAKE 1 TABLET BY MOUTH EVERY DAY 04/01/20   Deberah Pelton, NP  sildenafil (REVATIO) 20 MG tablet Take 20 mg by mouth daily as needed.    [provider]    Allergies    Other  Review of Systems   Review of Systems  All other systems reviewed and are negative.   Physical Exam Updated Vital Signs BP 130/87   Pulse (!) 50   Temp 98.3 F (36.8 C) (Oral)   Resp 16   Ht 1.676 m (5\' 6" )   Wt 84.1 kg   SpO2 99%   BMI 29.93 kg/m   Physical Exam Vitals and nursing note reviewed.  Constitutional:      General: He is not in acute distress.    Appearance: He is well-developed and well-nourished.  HENT:     Head: Normocephalic and atraumatic.     Right Ear: External ear normal.     Left Ear: External ear normal.  Eyes:     General: No scleral icterus.       Right eye: No discharge.        Left eye: No discharge.     Conjunctiva/sclera: Conjunctivae normal.  Neck:     Trachea: No tracheal deviation.  Cardiovascular:      Rate and Rhythm: Normal rate and regular rhythm.     Pulses: Intact distal pulses.  Pulmonary:  Effort: Pulmonary effort is normal. No respiratory distress.     Breath sounds: Normal breath sounds. No stridor. No wheezing or rales.  Abdominal:     General: Bowel sounds are normal. There is no distension.     Palpations: Abdomen is soft.     Tenderness: There is no abdominal tenderness. There is no guarding or rebound.  Musculoskeletal:        General: No swelling, tenderness, deformity or edema.     Cervical back: Neck supple.  Skin:    General: Skin is warm and dry.     Findings: No rash.  Neurological:     Mental Status: He is alert.     Cranial Nerves: No cranial nerve deficit (no facial droop, extraocular movements intact, no slurred speech).     Sensory: No sensory deficit.     Motor: No abnormal muscle tone or seizure activity.     Coordination: Coordination normal.     Deep Tendon Reflexes: Strength normal.  Psychiatric:        Mood and Affect: Mood and affect normal.     ED Results / Procedures / Treatments   Labs (all labs ordered are listed, but only abnormal results are displayed) Labs Reviewed  TROPONIN I (HIGH SENSITIVITY)    EKG EKG Interpretation  Date/Time:  Thursday October 01 2020 07:15:33 EST Ventricular Rate:  62 PR Interval:  142 QRS Duration: 88 QT Interval:  420 QTC Calculation: 426 R Axis:   65 Text Interpretation: Normal sinus rhythm Cannot rule out Anterior infarct , age undetermined Abnormal ECG No significant change since last tracing Confirmed by Dorie Rank [id number]) on 10/01/2020 7:48:05 AM   Radiology DG Chest 2 View  Result Date: 09/30/2020 CLINICAL DATA:  Chest pain EXAM: CHEST - 2 VIEW COMPARISON:  09/22/2016 FINDINGS: The heart size and mediastinal contours are within normal limits. Both lungs are clear. The visualized skeletal structures are unremarkable. Bilateral nipple shadows are again seen. IMPRESSION: No active  cardiopulmonary disease. Electronically Signed   By: Inez Catalina M.D.   On: 09/30/2020 19:35    Procedures Procedures (including critical care time)  Medications Ordered in ED Medications  alum & mag hydroxide-simeth (MAALOX/MYLANTA) 200-200-20 MG/5ML suspension 30 mL (30 mLs Oral Given 10/01/20 0841)    And  lidocaine (XYLOCAINE) 2 % viscous mouth solution 15 mL (15 mLs Oral Given 10/01/20 8315)    ED Course  I have reviewed the triage vital signs and the nursing notes.  Pertinent labs & imaging results that were available during my care of the patient were reviewed by me and considered in my medical decision making (see chart for details).  Clinical Course as of 10/01/20 1240  Thu Oct 01, 2020  1761 Lab results reviewed from yesterday's visit where patient left before being seen.  CBC was normal.  Patient also had basic metabolic panel and 1 troponin that were normal [JK]  0749 Chest x-ray yesterday was normal [JK]  1218 Trop normal at 4 [JK]  1238 Patient noticed improvement after the GI cocktail [JK]    Clinical Course User Index [JK] Dorie Rank, MD   MDM Rules/Calculators/A&P                          Patient presented to ED with complaints of chest pain.  Patient does have history of non-ST elevation MI and PCI.  Patient symptoms today atypical for ACS.  Troponin here today was normal and the troponin  last night was normal.  X-rays not show any evidence of pneumonia.  No findings to suggest PE or aortic dissection.  Patient is having some sharp atypical discomfort.  Could be musculoskeletal or he may be having an acid reflux component.  Appears stable for discharge.  Outpatient follow-up with cardiology. Final Clinical Impression(s) / ED Diagnoses Final diagnoses:  Chest pain, unspecified type  Gastroesophageal reflux disease, unspecified whether esophagitis present    Rx / DC Orders ED Discharge Orders         Ordered    sucralfate (CARAFATE) 1 g tablet  3 times daily  with meals & bedtime        10/01/20 1240           Dorie Rank, MD 10/01/20 1240

## 2020-10-01 NOTE — ED Notes (Signed)
Called multiple times per NT no answer

## 2020-10-01 NOTE — Discharge Instructions (Signed)
Take the antacid medication in addition to your Protonix.  Follow-up with your doctor next week to be rechecked.  Continue your current medications

## 2020-10-01 NOTE — ED Notes (Signed)
Called lab to f/u on trop for this pt.

## 2020-10-01 NOTE — ED Triage Notes (Signed)
Patient seen for same last night but left because of the long wait, patient reports he woke up with chest pain again this morning and decided to come back.

## 2020-10-02 ENCOUNTER — Telehealth: Payer: Self-pay | Admitting: Cardiology

## 2020-10-02 NOTE — Telephone Encounter (Signed)
     Pt c/o medication issue:  1. Name of Medication: Chantix  2. How are you currently taking this medication (dosage and times per day)?   3. Are you having a reaction (difficulty breathing--STAT)?  4. What is your medication issue? Pt is calling again about chantix, he said he wanted to stop smoking and if its ok to take chantix with his other heart meds.

## 2020-10-02 NOTE — Telephone Encounter (Signed)
Returned the call to the patient. He would like to start Chantix and wants to know if it is okay with his other cardiac medications.   He has already started it and it has been helping so far.

## 2020-10-02 NOTE — Telephone Encounter (Signed)
Ok to take Chantix, no interactions with his other meds.

## 2020-10-02 NOTE — Telephone Encounter (Signed)
Patient has been made aware and verbalized his understanding.

## 2020-11-09 ENCOUNTER — Telehealth: Payer: Self-pay | Admitting: *Deleted

## 2020-11-09 NOTE — Telephone Encounter (Signed)
   [facility] Pre-operative Risk Assessment    HEARTCARE STAFF: - Please ensure there is not already an duplicate clearance open for this procedure. - Under Visit Info/Reason for Call, type in Other and utilize the format Clearance MM/DD/YY or Clearance TBD. Do not use dashes or single digits. - If request is for dental extraction, please clarify the # of teeth to be extracted.  Request for surgical clearance:  1. What type of surgery is being performed? LEFT WRIST PROXIMAL ROW CARPECTOMY, LEFT WRIST POSTERIOR INTEROSSEOUS NERVE NEUROECTOMY    2. When is this surgery scheduled? TBD   3. What type of clearance is required (medical clearance vs. Pharmacy clearance to hold med vs. Both)? MEDICAL  4. Are there any medications that need to be held prior to surgery and how long? ASA  5. Practice name and name of physician performing surgery? [facility]; DR. Charlotte Crumb  6. What is the office phone number? [phone]   7.   What is the office fax number? [phone]  8.   Anesthesia type (None, local, MAC, general) ? AXILLARY/MAC   Julaine Hua 11/09/2020, 4:51 PM  _________________________________________________________________   (provider comments below)

## 2020-11-10 ENCOUNTER — Other Ambulatory Visit: Payer: Self-pay

## 2020-11-10 ENCOUNTER — Telehealth: Payer: Self-pay | Admitting: Cardiology

## 2020-11-10 ENCOUNTER — Ambulatory Visit (INDEPENDENT_AMBULATORY_CARE_PROVIDER_SITE_OTHER): Payer: Medicaid Other | Admitting: Cardiology

## 2020-11-10 ENCOUNTER — Encounter: Payer: Self-pay | Admitting: Cardiology

## 2020-11-10 VITALS — BP 146/98 | HR 54 | Ht 66.0 in | Wt 185.6 lb

## 2020-11-10 DIAGNOSIS — I10 Essential (primary) hypertension: Secondary | ICD-10-CM

## 2020-11-10 DIAGNOSIS — I2510 Atherosclerotic heart disease of native coronary artery without angina pectoris: Secondary | ICD-10-CM

## 2020-11-10 DIAGNOSIS — Z9861 Coronary angioplasty status: Secondary | ICD-10-CM

## 2020-11-10 DIAGNOSIS — I119 Hypertensive heart disease without heart failure: Secondary | ICD-10-CM | POA: Diagnosis not present

## 2020-11-10 DIAGNOSIS — I255 Ischemic cardiomyopathy: Secondary | ICD-10-CM | POA: Diagnosis not present

## 2020-11-10 DIAGNOSIS — E785 Hyperlipidemia, unspecified: Secondary | ICD-10-CM

## 2020-11-10 MED ORDER — ROSUVASTATIN CALCIUM 40 MG PO TABS: 40.0000 mg | ORAL_TABLET | Freq: Every day | ORAL | 3 refills | Status: DC

## 2020-11-10 NOTE — Telephone Encounter (Signed)
Will send message to [facility] scheduling team to see if we can get pt in sooner for pre op appt.

## 2020-11-10 NOTE — Telephone Encounter (Signed)
Patient scheduled for today with Dr. Percival Spanish at 2:20pm for pre-op appointment.

## 2020-11-10 NOTE — Telephone Encounter (Signed)
   Chart reviewed per preop clinic protocol  65 y.o. male with: - CAD s/p NSTEMI 7/17 s/p DES to OM2 - HFmrEF (heart failure with mildly reduced ejection fraction)    - EF 35-40>>45-50 - Ischemic CM - HTN - HLD - Substance abuse (ETOH, cocaine) - GERD - 09/30/2020: Creatinine, Ser 1.22    Echo 10/24/2018: EF 45-50, no significant valve disease Myoview 10/2017: EF 34; inf infarct, no ischemia Cath 03/2016: OM2 99 >> DES; no sig dz in LAD, RCA  Last OV 09/30/20 with Dr. Margaretann Loveless - seen acutely for chest pain and sent to ED.  Pt left before being seen by MD/APP.  Pt returned to ED next day.  Notes indicate improvement with GI cocktail.  hsTrop was normal without significant ?; CXR w NAD.  No cardiology f/u since.  Dr. Delphina Cahill note indicated pt would at least need a f/u echocardiogram.  No echocardiogram done since 2020.    Left message for pt to call back.  Given recent hx pt may need f/u OV or Myoview/Echo.  Will also d/w Dr. Stanford Breed.  Richardson Dopp, PA-C    11/10/2020 8:21 AM

## 2020-11-10 NOTE — Patient Instructions (Addendum)
Medication Instructions:  Stop Lipitor Start Crestor 40mg  daily *If you need a refill on your cardiac medications before your next appointment, please call your pharmacy*  Lab Work: Your physician recommends that you return for lab work in: 10 weeks (Lipids / Liver) - 01/19/2021 If you have labs (blood work) drawn today and your tests are completely normal, you will receive your results only by: Marland Kitchen MyChart Message (if you have MyChart) OR . A paper copy in the mail If you have any lab test that is abnormal or we need to change your treatment, we will call you to review the results.  Testing/Procedures: Your physician has requested that you have an echocardiogram. Echocardiography is a painless test that uses sound waves to create images of your heart. It provides your doctor with information about the size and shape of your heart and how well your heart's chambers and valves are working. This procedure takes approximately one hour. There are no restrictions for this procedure. [facility]  Follow-Up: At [facility], you and your health needs are our priority.  As part of our continuing mission to provide you with exceptional heart care, we have created designated Provider Care Teams.  These Care Teams include your primary Cardiologist (physician) and Advanced Practice Providers (APPs -  Physician Assistants and Nurse Practitioners) who all work together to provide you with the care you need, when you need it.  Your next appointment:   Tuesday December 15, 2020 @ 3:40pm  The format for your next appointment:   In Person  Provider:   Kirk Ruths, MD

## 2020-11-10 NOTE — Telephone Encounter (Signed)
Message sent to scheduling from [place] patient needing a preop clearance appt this week. Patient has surgery on Monday and she asked that we try to squeeze him in on the schedule this week. I called patient and he cannot do Friday. I don't see anything available at this time, I saw a DOD spot but that was for Friday. Not sure where to go from here. Please advise.

## 2020-11-10 NOTE — Progress Notes (Signed)
Cardiology Office Note   Date:  11/10/2020   ID:  Lucas Stark, DOB [birth year], MRN [id number]  PCP:  Lucas Moment, MD  Cardiologist:   Lucas Ruths, MD   Chief Complaint  Patient presents with  . Chest Pain      History of Present Illness: Lucas Stark is a 65 y.o. male who presents for he has a history of coronary disease and a mildly reduced ejection fraction.  He has had a couple of visits to the ER recently with chest discomfort.  He was seen by Dr. Margaretann Loveless on 1/19 for chest pain.  He was referred to the ED.   it does not look like he was seen that day but I do see enzymes and EKG are unremarkable.  On January 20th he returned to the emergency room in January with chest discomfort. I reviewed these records for this visit.    He had no objective evidence of ischemia.  He was felt to have nonanginal chest pain.    He is preop for wrist surgery.    His past cardiac history includes non-ST elevation myocardial infarction. Echocardiogram July 2017 showed ejection fraction 35-40% and grade 2 diastolic dysfunction. Cardiac catheterization July 2017 showed mild LV systolic dysfunction, 23% second marginal. Patient had PCI of the second marginal with a drug-eluting stent. The patient had a cough with ACEI. Nuclear study February 2019 showed ejection fraction 34%, prior inferior infarct but no ischemia. Echocardiogram February 2020 showed mild LV dysfunction with ejection fraction 45 to 50%, mild left ventricular hypertrophy, grade 1 diastolic dysfunction.   He reports that the pain he had when he presented to the emergency room was on the right side.  It is not sharp like his previous angina.  He thought it was different than his previous angina.  He was treated with Tums and lidocaine and actually went away.  He has been raising mattress of his bed and he has not had any further symptoms.  He is able to climb stairs and not get chest discomfort.  He has some chronic dyspnea with exertion  on climbing stairs.  He has some bendopathy.  He denies PND or orthopnea.  Has had no palpitations, presyncope or syncope.  He is a great grandfather with 8 children.  He is a Programmer, applications.   Past Medical History:  Diagnosis Date  . CAD (coronary artery disease) cardiologist-- dr Stanford Breed   a. 03/2016 NSTEMI/PCI: LM nl, LAD nl, RI nl, LCX nl, OM2 99 ( 2.75 x 16 Synergy DES), RCA nl, EF 45-50%.  Marland Kitchen ETOH abuse   . GERD (gastroesophageal reflux disease)   . History of cocaine abuse ([place])    per pt none since 2017  . History of non-ST elevation myocardial infarction (NSTEMI) 03/14/2016   s/p  PCI and DES x1  . Hyperlipidemia   . Hypertension   . Hypertensive heart disease   . Ischemic cardiomyopathy    a. 03/2016 Echo: EF 35-40%, diff H, sev inflat HK, Gr2 DD;  b. 03/2016 EF 45-50% by LV gram.;  echo 10-24-2018, ef 45-50%  . Myocardial infarction [facility]) 2017   NSTEMI   . OA (osteoarthritis)    left knee  . S/P drug eluting coronary stent placement 03/14/2016   DES x1 to OM1  . Tobacco abuse   . Wears dentures    upper  . Wears partial dentures    lower    Past Surgical History:  Procedure Laterality Date  .  CARDIAC CATHETERIZATION N/A 03/14/2016   Procedure: Left Heart Cath and Coronary Angiography;  Surgeon: Burnell Blanks, MD;  Location: [place] [facility];  Service: Cardiovascular;  Laterality: N/A;  . CORONARY ANGIOPLASTY WITH STENT PLACEMENT    . FACIAL COSMETIC SURGERY     at age 65 yrs old  . LACERATION REPAIR  age 23   face around eye  . PARTIAL KNEE ARTHROPLASTY Left 11/13/2018   Procedure: UNICOMPARTMENTAL KNEE;  Surgeon: Renette Butters, MD;  Location: [facility];  Service: Orthopedics;  Laterality: Left;  . WRIST SURGERY Left 01/31/2020     Current Outpatient Medications  Medication Sig Dispense Refill  . aspirin EC 81 MG tablet Take 81 mg by mouth daily. Swallow whole.    . carvedilol (COREG) 6.25 MG tablet TAKE 1 TABLET BY MOUTH 2 TIMES DAILY 180 tablet 3   . chlorthalidone (HYGROTON) 25 MG tablet TAKE 1/2 TABLET BY MOUTH EVERY DAY 45 tablet 3  . D3 SUPER STRENGTH 2000 units CAPS Take 2,000 Units by mouth daily.  0  . ezetimibe (ZETIA) 10 MG tablet Take 1 tablet (10 mg total) by mouth daily. 90 tablet 3  . losartan (COZAAR) 50 MG tablet Take 1 tablet (50 mg total) by mouth daily. 90 tablet 3  . nitroGLYCERIN (NITROSTAT) 0.4 MG SL tablet Place 1 tablet (0.4 mg total) under the tongue every 5 (five) minutes as needed for chest pain. 25 tablet 3  . pantoprazole (PROTONIX) 20 MG tablet TAKE 1 TABLET BY MOUTH EVERY DAY 30 tablet 3  . rosuvastatin (CRESTOR) 40 MG tablet Take 1 tablet (40 mg total) by mouth daily. 90 tablet 3  . sildenafil (REVATIO) 20 MG tablet Take 20 mg by mouth daily as needed.    . sucralfate (CARAFATE) 1 g tablet Take 1 tablet (1 g total) by mouth 4 (four) times daily -  with meals and at bedtime. 21 tablet 0   Current Facility-Administered Medications  Medication Dose Route Frequency Provider Last Rate Last Admin  . nitroGLYCERIN (NITROSTAT) SL tablet 0.4 mg  0.4 mg Sublingual Q5 min PRN Cherlynn Kaiser A, MD   0.4 mg at 09/30/20 1623    Allergies:   Other    ROS:  Please see the history of present illness.   Otherwise, review of systems are positive for none.   All other systems are reviewed and negative.    PHYSICAL EXAM: VS:  BP (!) 146/98   Pulse (!) 54   Ht 5\' 6"  (1.676 m)   Wt 185 lb 9.6 oz (84.2 kg)   SpO2 94%   BMI 29.96 kg/m  , BMI Body mass index is 29.96 kg/m. GENERAL:  Well appearing HEENT:  Pupils equal round and reactive, fundi not visualized, oral mucosa unremarkable NECK:  No jugular venous distention, waveform within normal limits, carotid upstroke brisk and symmetric, no bruits, no thyromegaly LYMPHATICS:  No cervical, inguinal adenopathy LUNGS:  Clear to auscultation bilaterally BACK:  No CVA tenderness CHEST:  Unremarkable HEART:  PMI not displaced or sustained,S1 and S2 within normal limits,  no S3, no S4, no clicks, no rubs, no murmurs ABD:  Flat, positive bowel sounds normal in frequency in pitch, no bruits, no rebound, no guarding, no midline pulsatile mass, no hepatomegaly, no splenomegaly EXT:  2 plus pulses throughout, no edema, no cyanosis no clubbing SKIN:  No rashes no nodules NEURO:  Cranial nerves II through XII grossly intact, motor grossly intact throughout PSYCH:  Cognitively intact, oriented to person  place and time    EKG:  EKG is ordered today. The ekg ordered today demonstrates sinus rhythm, rate 54, axis within normal limits, intervals within normal limits, no acute ST-T wave changes.   Recent Labs: 02/18/2020: ALT 12 09/30/2020: BUN 10; Creatinine, Ser 1.22; Hemoglobin 16.6; Platelets 203; Potassium 4.2; Sodium 139    Lipid Panel    Component Value Date/Time   CHOL 166 02/18/2020 1024   TRIG 122 02/18/2020 1024   HDL 51 02/18/2020 1024   CHOLHDL 3.3 02/18/2020 1024   CHOLHDL 3.1 03/12/2016 1312   VLDL 8 03/12/2016 1312   LDLCALC 93 02/18/2020 1024      Wt Readings from Last 3 Encounters:  11/10/20 185 lb 9.6 oz (84.2 kg)  10/01/20 185 lb 6.5 oz (84.1 kg)  09/30/20 185 lb 6.4 oz (84.1 kg)      Other studies Reviewed: Additional studies/ records that were reviewed today include: ED records labs. Review of the above records demonstrates:  Please see elsewhere in the note.     ASSESSMENT AND PLAN:   CHEST PAIN: His chest pain is nonanginal.  He is not having any further discomfort.  No further cardiovascular testing is suggested.  He is continue risk reduction.  CAD: As above.  CARDIOMYOPATHY: He does have some dyspnea and had a mildly reduced ejection fraction in the past.  I will follow up with an echocardiogram.  However he does not have signs of overt heart failure.  HTN: His blood pressure is mildly elevated but looking back this was unusual.  No change in therapy.  DYSLIPIDEMIA: He says he does not think the Lipitor makes him "feel  good."  He is not quite at target.  I am going to take the liberty of switching him from Lipitor 80 to Crestor 40 mg daily and he can get a lipid and liver in about 8 weeks.  TOBACCO: We talked about the need to stop smoking.  He did not tolerate Chantix.  He is encouraged to quit.  PREOP: This patient is at acceptable risk for the planned surgery.  He has a high functional level.  He is not going for high risk procedure.  He has no unstable symptoms or findings.  No further testing is indicated although I will check the echo as above.  Current medicines are reviewed at length with the patient today.  The patient does not have concerns regarding medicines.  The following changes have been made: As above  Labs/ tests ordered today include:   Orders Placed This Encounter  Procedures  . Lipid panel  . Hepatic function panel  . EKG 12-Lead  . ECHOCARDIOGRAM COMPLETE     Disposition:   FU with Dr. Stanford Breed as previously suggested   Signed, Minus Breeding, MD  11/10/2020 2:42 PM    [place]

## 2020-11-10 NOTE — Telephone Encounter (Signed)
Probably at least needs ov prior to surgery to reassess. Lucas Stark

## 2020-11-10 NOTE — Telephone Encounter (Signed)
Pt has been scheduled to see Dr. Percival Spanish today @ 2:20. I will forward notes to MD for appt today.

## 2020-11-10 NOTE — Telephone Encounter (Signed)
   Primary Cardiologist: Kirk Ruths, MD  Chart reviewed as part of pre-operative protocol coverage.  Please see note from Dr. Stanford Breed.  The pt will require a follow-up visit in order to better assess preoperative cardiovascular risk.  Pre-op covering staff: - Please schedule appointment and call patient to inform them. If patient already had an upcoming appointment within acceptable timeframe, please add "pre-op clearance" to the appointment notes so provider is aware. - Please contact requesting surgeon's office via preferred method (i.e, phone, fax) to inform them of need for appointment prior to surgery.   Richardson Dopp, PA-C  11/10/2020, 8:55 AM

## 2020-11-11 ENCOUNTER — Ambulatory Visit (HOSPITAL_BASED_OUTPATIENT_CLINIC_OR_DEPARTMENT_OTHER)
Admission: RE | Admit: 2020-11-11 | Discharge: 2020-11-11 | Disposition: A | Discharge status: Home / Self Care | Payer: Medicaid Other | Source: Ambulatory Visit | Attending: Cardiology | Admitting: Cardiology

## 2020-11-11 DIAGNOSIS — I255 Ischemic cardiomyopathy: Secondary | ICD-10-CM | POA: Insufficient documentation

## 2020-11-11 LAB — ECHOCARDIOGRAM COMPLETE
Area-P 1/2: 3.34 cm2
S' Lateral: 4.5 cm

## 2020-11-16 ENCOUNTER — Other Ambulatory Visit: Payer: Self-pay

## 2020-11-16 ENCOUNTER — Encounter (HOSPITAL_COMMUNITY): Payer: Self-pay

## 2020-11-16 ENCOUNTER — Emergency Department (HOSPITAL_COMMUNITY)
Admission: EM | Admit: 2020-11-16 | Discharge: 2020-11-17 | Disposition: A | Discharge status: Home / Self Care | Payer: Medicaid Other | Attending: Emergency Medicine | Admitting: Emergency Medicine

## 2020-11-16 DIAGNOSIS — Z79899 Other long term (current) drug therapy: Secondary | ICD-10-CM | POA: Insufficient documentation

## 2020-11-16 DIAGNOSIS — Z9861 Coronary angioplasty status: Secondary | ICD-10-CM | POA: Diagnosis not present

## 2020-11-16 DIAGNOSIS — I119 Hypertensive heart disease without heart failure: Secondary | ICD-10-CM | POA: Diagnosis not present

## 2020-11-16 DIAGNOSIS — G8918 Other acute postprocedural pain: Secondary | ICD-10-CM | POA: Diagnosis not present

## 2020-11-16 DIAGNOSIS — Z7982 Long term (current) use of aspirin: Secondary | ICD-10-CM | POA: Diagnosis not present

## 2020-11-16 DIAGNOSIS — I10 Essential (primary) hypertension: Secondary | ICD-10-CM

## 2020-11-16 DIAGNOSIS — F17210 Nicotine dependence, cigarettes, uncomplicated: Secondary | ICD-10-CM | POA: Diagnosis not present

## 2020-11-16 DIAGNOSIS — I2510 Atherosclerotic heart disease of native coronary artery without angina pectoris: Secondary | ICD-10-CM | POA: Insufficient documentation

## 2020-11-16 DIAGNOSIS — M79642 Pain in left hand: Secondary | ICD-10-CM | POA: Diagnosis present

## 2020-11-16 MED ORDER — HYDROMORPHONE HCL 1 MG/ML IJ SOLN
0.5000 mg | Freq: Once | INTRAMUSCULAR | Status: AC
  Administered 2020-11-17: 0.5 mg via INTRAMUSCULAR
  Filled 2020-11-16: qty 1

## 2020-11-16 NOTE — ED Triage Notes (Signed)
Pt arrives EMS after left hand surgery completed here today. Pt has no idea what kind of surgery was completed. Arrives with c/o increasing pain after regaining feeling at 1430. Last oxycodone taken at 1730.   Possible proximal carpectomy and a posterior osseous nerve neurectomy per previous note.

## 2020-11-16 NOTE — ED Provider Notes (Signed)
[place] DEPT Provider Note   CSN: [id number] Arrival date & time: 11/16/20  2214     History Chief Complaint  Patient presents with  . Post-op Problem    Lucas Stark is a 65 y.o. male.  65 year old male with prior medical history detailed below presents for evaluation.  Patient status post procedure earlier today - proximal carpectomy and a posterior osseous nerve neurectomy.  Patient reports that he took 1 oxycodone around 2:00 this afternoon. Patient was given nerve block at time of procedure.   Mid-afternoon he reported increased pain to the left hand.  Patient contacted Dr. Fredna Dow covering for Dr. Burney Gauze. Advised to loosen dressing and come to ED for evaluation.   Patient removed his dressing and reports improved pain.   The history is provided by the patient and medical records.  Hand Pain This is a new problem. The current episode started 3 to 5 hours ago. The problem occurs rarely. The problem has not changed since onset.Nothing aggravates the symptoms. Nothing relieves the symptoms.       Past Medical History:  Diagnosis Date  . CAD (coronary artery disease) cardiologist-- dr Stanford Breed   a. 03/2016 NSTEMI/PCI: LM nl, LAD nl, RI nl, LCX nl, OM2 99 ( 2.75 x 16 Synergy DES), RCA nl, EF 45-50%.  Marland Kitchen ETOH abuse   . GERD (gastroesophageal reflux disease)   . History of cocaine abuse ([place])    per pt none since 2017  . History of non-ST elevation myocardial infarction (NSTEMI) 03/14/2016   s/p  PCI and DES x1  . Hyperlipidemia   . Hypertension   . Hypertensive heart disease   . Ischemic cardiomyopathy    a. 03/2016 Echo: EF 35-40%, diff H, sev inflat HK, Gr2 DD;  b. 03/2016 EF 45-50% by LV gram.;  echo 10-24-2018, ef 45-50%  . Myocardial infarction [facility]) 2017   NSTEMI   . OA (osteoarthritis)    left knee  . S/P drug eluting coronary stent placement 03/14/2016   DES x1 to OM1  . Tobacco abuse   . Wears dentures    upper  . Wears  partial dentures    lower    Patient Active Problem List   Diagnosis Date Noted  . Primary localized osteoarthritis of knee 11/13/2018  . Primary osteoarthritis of left knee 10/24/2018  . Knee effusion, left 08/15/2018  . Hypertensive heart disease without CHF 03/15/2016  . Dyslipidemia, goal LDL below 70 03/15/2016  . CAD S/P percutaneous coronary angioplasty 03/15/2016  . Cardiomyopathy, ischemic 03/15/2016  . Status post coronary artery stent placement   . History of non-ST elevation myocardial infarction (NSTEMI) 03/12/2016  . Benign essential HTN 03/12/2016  . Tobacco abuse 03/12/2016  . Cocaine use 03/12/2016  . Alcohol abuse 03/12/2016    Past Surgical History:  Procedure Laterality Date  . CARDIAC CATHETERIZATION N/A 03/14/2016   Procedure: Left Heart Cath and Coronary Angiography;  Surgeon: Burnell Blanks, MD;  Location: [place] [facility];  Service: Cardiovascular;  Laterality: N/A;  . CORONARY ANGIOPLASTY WITH STENT PLACEMENT    . FACIAL COSMETIC SURGERY     at age 10 yrs old  . LACERATION REPAIR  age 36   face around eye  . PARTIAL KNEE ARTHROPLASTY Left 11/13/2018   Procedure: UNICOMPARTMENTAL KNEE;  Surgeon: Renette Butters, MD;  Location: [facility];  Service: Orthopedics;  Laterality: Left;  . WRIST SURGERY Left 01/31/2020       Family History  Problem Relation  Age of Onset  . Heart attack Father   . Heart attack Sister   . Colon polyps Maternal Uncle   . Colon cancer Neg Hx   . Esophageal cancer Neg Hx   . Stomach cancer Neg Hx   . Rectal cancer Neg Hx     Social History   Tobacco Use  . Smoking status: Current Some Day Smoker    Packs/day: 1.00    Years: 35.00    Pack years: 35.00    Types: Cigarettes  . Smokeless tobacco: Never Used  . Tobacco comment: some days less   Vaping Use  . Vaping Use: Never used  Substance Use Topics  . Alcohol use: Yes    Alcohol/week: 0.0 standard drinks    Comment: pint liquor (gin) daily; 11-06-2018 last  two days had a airplane bottle each daily, and on weekend one beer and couple of drink  . Drug use: Not Currently    Comment: 11-06-2018 per pt last cocaine "when I had my heart attack"  2017    Home Medications Prior to Admission medications   Medication Sig Start Date End Date Taking? Authorizing Provider  aspirin EC 81 MG tablet Take 81 mg by mouth daily. Swallow whole.    [provider]  carvedilol (COREG) 6.25 MG tablet TAKE 1 TABLET BY MOUTH 2 TIMES DAILY 02/07/20   Lelon Perla, MD  chlorthalidone (HYGROTON) 25 MG tablet TAKE 1/2 TABLET BY MOUTH EVERY DAY 03/03/20   Lelon Perla, MD  D3 SUPER STRENGTH 2000 units CAPS Take 2,000 Units by mouth daily. 08/24/17   [provider]  ezetimibe (ZETIA) 10 MG tablet Take 1 tablet (10 mg total) by mouth daily. 02/26/20 05/26/20  Lelon Perla, MD  losartan (COZAAR) 50 MG tablet Take 1 tablet (50 mg total) by mouth daily. 09/24/19   Deberah Pelton, NP  nitroGLYCERIN (NITROSTAT) 0.4 MG SL tablet Place 1 tablet (0.4 mg total) under the tongue every 5 (five) minutes as needed for chest pain. 10/10/17   Lendon Colonel, NP  pantoprazole (PROTONIX) 20 MG tablet TAKE 1 TABLET BY MOUTH EVERY DAY 04/01/20   Deberah Pelton, NP  rosuvastatin (CRESTOR) 40 MG tablet Take 1 tablet (40 mg total) by mouth daily. 11/10/20 02/08/21  Minus Breeding, MD  sildenafil (REVATIO) 20 MG tablet Take 20 mg by mouth daily as needed.    [provider]  sucralfate (CARAFATE) 1 g tablet Take 1 tablet (1 g total) by mouth 4 (four) times daily -  with meals and at bedtime. 10/01/20   Dorie Rank, MD    Allergies    Other  Review of Systems   Review of Systems  All other systems reviewed and are negative.   Physical Exam Updated Vital Signs BP (!) 165/110 (BP Location: Right Arm)   Pulse 75   Temp 99.9 F (37.7 C) (Oral)   Resp 16   SpO2 97%   Physical Exam Vitals and nursing note reviewed.  Constitutional:      General: He  is not in acute distress.    Appearance: He is well-developed and well-nourished.  HENT:     Head: Normocephalic and atraumatic.     Mouth/Throat:     Mouth: Oropharynx is clear and moist.  Eyes:     Extraocular Movements: EOM normal.     Conjunctiva/sclera: Conjunctivae normal.     Pupils: Pupils are equal, round, and reactive to light.  Cardiovascular:     Rate  and Rhythm: Normal rate and regular rhythm.     Heart sounds: Normal heart sounds.  Pulmonary:     Effort: Pulmonary effort is normal. No respiratory distress.     Breath sounds: Normal breath sounds.  Abdominal:     General: There is no distension.     Palpations: Abdomen is soft.     Tenderness: There is no abdominal tenderness.  Musculoskeletal:        General: No deformity or edema. Normal range of motion.     Cervical back: Normal range of motion and neck supple.     Comments: See photo of left hand below  Sensation intact to distal fingers of left hand, no significant edema noted to fingers/hand - AROM limited by pain.   Skin:    General: Skin is warm and dry.  Neurological:     General: No focal deficit present.     Mental Status: He is alert and oriented to person, place, and time.  Psychiatric:        Mood and Affect: Mood and affect normal.           ED Results / Procedures / Treatments   Labs (all labs ordered are listed, but only abnormal results are displayed) Labs Reviewed - No data to display  EKG None  Radiology No results found.  Procedures Procedures   Medications Ordered in ED Medications  HYDROmorphone (DILAUDID) injection 0.5 mg (has no administration in time range)    ED Course  I have reviewed the triage vital signs and the nursing notes.  Pertinent labs & imaging results that were available during my care of the patient were reviewed by me and considered in my medical decision making (see chart for details).    MDM Rules/Calculators/A&P                           MDM  Screen complete  QUINTAVIOUS RINCK was evaluated in Emergency Department on 11/16/2020 for the symptoms described in the history of present illness. He was evaluated in the context of the global COVID-19 pandemic, which necessitated consideration that the patient might be at risk for infection with the SARS-CoV-2 virus that causes COVID-19. Institutional protocols and algorithms that pertain to the evaluation of patients at risk for COVID-19 are in a state of rapid change based on information released by regulatory bodies including the CDC and federal and state organizations. These policies and algorithms were followed during the patient's care in the ED.   Patient for evaluation post recent procedure on left hand.  Sent by Dr. Fredna Dow for evaluation.   Dressing to hand already removed by patient prior to evaluation.   Will treat pain and re-eval.   Dr. Roxanne Mins is aware of pending re-evaluation.   Final Clinical Impression(s) / ED Diagnoses Final diagnoses:  Post-operative pain    Rx / DC Orders ED Discharge Orders    None       Valarie Merino, MD 11/17/20 0010

## 2020-11-16 NOTE — ED Provider Notes (Incomplete)
[place] DEPT Provider Note   CSN: [id number] Arrival date & time: 11/16/20  2214     History Chief Complaint  Patient presents with  . Post-op Problem    Lucas Stark is a 65 y.o. male.  65 year old male with prior medical history detailed below presents for evaluation.  Patient status post procedure earlier today - proximal carpectomy and a posterior osseous nerve neurectomy.  Patient reports that he took 1 oxycodone around 2:00 this afternoon. Patient was given nerve block at time of procedure.   Mid-afternoon he reported increased pain to the left hand.  Patient contacted Dr. Fredna Dow covering for Dr. Burney Gauze. Advised to loosen dressing and come to ED for evaluation.   Patient removed his dressing and reports improved pain.   The history is provided by the patient and medical records.  Hand Pain This is a new problem. The current episode started 3 to 5 hours ago. The problem occurs rarely. The problem has not changed since onset.Nothing aggravates the symptoms. Nothing relieves the symptoms.       Past Medical History:  Diagnosis Date  . CAD (coronary artery disease) cardiologist-- dr Stanford Breed   a. 03/2016 NSTEMI/PCI: LM nl, LAD nl, RI nl, LCX nl, OM2 99 ( 2.75 x 16 Synergy DES), RCA nl, EF 45-50%.  Marland Kitchen ETOH abuse   . GERD (gastroesophageal reflux disease)   . History of cocaine abuse ([place])    per pt none since 2017  . History of non-ST elevation myocardial infarction (NSTEMI) 03/14/2016   s/p  PCI and DES x1  . Hyperlipidemia   . Hypertension   . Hypertensive heart disease   . Ischemic cardiomyopathy    a. 03/2016 Echo: EF 35-40%, diff H, sev inflat HK, Gr2 DD;  b. 03/2016 EF 45-50% by LV gram.;  echo 10-24-2018, ef 45-50%  . Myocardial infarction [facility]) 2017   NSTEMI   . OA (osteoarthritis)    left knee  . S/P drug eluting coronary stent placement 03/14/2016   DES x1 to OM1  . Tobacco abuse   . Wears dentures    upper  . Wears  partial dentures    lower    Patient Active Problem List   Diagnosis Date Noted  . Primary localized osteoarthritis of knee 11/13/2018  . Primary osteoarthritis of left knee 10/24/2018  . Knee effusion, left 08/15/2018  . Hypertensive heart disease without CHF 03/15/2016  . Dyslipidemia, goal LDL below 70 03/15/2016  . CAD S/P percutaneous coronary angioplasty 03/15/2016  . Cardiomyopathy, ischemic 03/15/2016  . Status post coronary artery stent placement   . History of non-ST elevation myocardial infarction (NSTEMI) 03/12/2016  . Benign essential HTN 03/12/2016  . Tobacco abuse 03/12/2016  . Cocaine use 03/12/2016  . Alcohol abuse 03/12/2016    Past Surgical History:  Procedure Laterality Date  . CARDIAC CATHETERIZATION N/A 03/14/2016   Procedure: Left Heart Cath and Coronary Angiography;  Surgeon: Burnell Blanks, MD;  Location: [place] CV LAB;  Service: Cardiovascular;  Laterality: N/A;  . CORONARY ANGIOPLASTY WITH STENT PLACEMENT    . FACIAL COSMETIC SURGERY     at age 10 yrs old  . LACERATION REPAIR  age 36   face around eye  . PARTIAL KNEE ARTHROPLASTY Left 11/13/2018   Procedure: UNICOMPARTMENTAL KNEE;  Surgeon: Renette Butters, MD;  Location: [facility];  Service: Orthopedics;  Laterality: Left;  . WRIST SURGERY Left 01/31/2020       Family History  Problem Relation  Age of Onset  . Heart attack Father   . Heart attack Sister   . Colon polyps Maternal Uncle   . Colon cancer Neg Hx   . Esophageal cancer Neg Hx   . Stomach cancer Neg Hx   . Rectal cancer Neg Hx     Social History   Tobacco Use  . Smoking status: Current Some Day Smoker    Packs/day: 1.00    Years: 35.00    Pack years: 35.00    Types: Cigarettes  . Smokeless tobacco: Never Used  . Tobacco comment: some days less   Vaping Use  . Vaping Use: Never used  Substance Use Topics  . Alcohol use: Yes    Alcohol/week: 0.0 standard drinks    Comment: pint liquor (gin) daily; 11-06-2018 last  two days had a airplane bottle each daily, and on weekend one beer and couple of drink  . Drug use: Not Currently    Comment: 11-06-2018 per pt last cocaine "when I had my heart attack"  2017    Home Medications Prior to Admission medications   Medication Sig Start Date End Date Taking? Authorizing Provider  aspirin EC 81 MG tablet Take 81 mg by mouth daily. Swallow whole.    [provider]  carvedilol (COREG) 6.25 MG tablet TAKE 1 TABLET BY MOUTH 2 TIMES DAILY 02/07/20   Lelon Perla, MD  chlorthalidone (HYGROTON) 25 MG tablet TAKE 1/2 TABLET BY MOUTH EVERY DAY 03/03/20   Lelon Perla, MD  D3 SUPER STRENGTH 2000 units CAPS Take 2,000 Units by mouth daily. 08/24/17   [provider]  ezetimibe (ZETIA) 10 MG tablet Take 1 tablet (10 mg total) by mouth daily. 02/26/20 05/26/20  Lelon Perla, MD  losartan (COZAAR) 50 MG tablet Take 1 tablet (50 mg total) by mouth daily. 09/24/19   Deberah Pelton, NP  nitroGLYCERIN (NITROSTAT) 0.4 MG SL tablet Place 1 tablet (0.4 mg total) under the tongue every 5 (five) minutes as needed for chest pain. 10/10/17   Lendon Colonel, NP  pantoprazole (PROTONIX) 20 MG tablet TAKE 1 TABLET BY MOUTH EVERY DAY 04/01/20   Deberah Pelton, NP  rosuvastatin (CRESTOR) 40 MG tablet Take 1 tablet (40 mg total) by mouth daily. 11/10/20 02/08/21  Minus Breeding, MD  sildenafil (REVATIO) 20 MG tablet Take 20 mg by mouth daily as needed.    [provider]  sucralfate (CARAFATE) 1 g tablet Take 1 tablet (1 g total) by mouth 4 (four) times daily -  with meals and at bedtime. 10/01/20   Dorie Rank, MD    Allergies    Other  Review of Systems   Review of Systems  All other systems reviewed and are negative.   Physical Exam Updated Vital Signs BP (!) 165/110 (BP Location: Right Arm)   Pulse 75   Temp 99.9 F (37.7 C) (Oral)   Resp 16   SpO2 97%   Physical Exam Vitals and nursing note reviewed.  Constitutional:      General: He  is not in acute distress.    Appearance: He is well-developed and well-nourished.  HENT:     Head: Normocephalic and atraumatic.     Mouth/Throat:     Mouth: Oropharynx is clear and moist.  Eyes:     Extraocular Movements: EOM normal.     Conjunctiva/sclera: Conjunctivae normal.     Pupils: Pupils are equal, round, and reactive to light.  Cardiovascular:     Rate  and Rhythm: Normal rate and regular rhythm.     Heart sounds: Normal heart sounds.  Pulmonary:     Effort: Pulmonary effort is normal. No respiratory distress.     Breath sounds: Normal breath sounds.  Abdominal:     General: There is no distension.     Palpations: Abdomen is soft.     Tenderness: There is no abdominal tenderness.  Musculoskeletal:        General: No deformity or edema. Normal range of motion.     Cervical back: Normal range of motion and neck supple.     Comments: See photo of left hand below  Sensation intact to distal fingers of left hand, no significant edema noted to fingers/hand - AROM limited by pain.   Skin:    General: Skin is warm and dry.  Neurological:     Mental Status: He is alert and oriented to person, place, and time.  Psychiatric:        Mood and Affect: Mood and affect normal.           ED Results / Procedures / Treatments   Labs (all labs ordered are listed, but only abnormal results are displayed) Labs Reviewed - No data to display  EKG None  Radiology No results found.  Procedures Procedures {Remember to document critical care time when appropriate:1}  Medications Ordered in ED Medications  HYDROmorphone (DILAUDID) injection 0.5 mg (has no administration in time range)    ED Course  I have reviewed the triage vital signs and the nursing notes.  Pertinent labs & imaging results that were available during my care of the patient were reviewed by me and considered in my medical decision making (see chart for details).    MDM Rules/Calculators/A&P                           MDM  Screen complete  Lucas Stark was evaluated in Emergency Department on 11/16/2020 for the symptoms described in the history of present illness. He was evaluated in the context of the global COVID-19 pandemic, which necessitated consideration that the patient might be at risk for infection with the SARS-CoV-2 virus that causes COVID-19. Institutional protocols and algorithms that pertain to the evaluation of patients at risk for COVID-19 are in a state of rapid change based on information released by regulatory bodies including the CDC and federal and state organizations. These policies and algorithms were followed during the patient's care in the ED.   Patient for evaluation post recent procedure on left hand.  Sent by Dr. Fredna Dow for evaluation.   Dressing to hand already removed by patient prior to evaluation.   Will treat pain and re-eval.    Final Clinical Impression(s) / ED Diagnoses Final diagnoses:  None    Rx / DC Orders ED Discharge Orders    None

## 2020-11-16 NOTE — ED Notes (Signed)
Pt sts left hand feels tight. CRT<3 seconds, pulses present.

## 2020-11-17 NOTE — ED Notes (Signed)
Pt states " wheres the doctor im tired sitting here I want to leave." Nursed asked pt if there was anything that could be done to make him more comfortable. Pt states " im sititng here doing nothing im tired and I want to leave." provider made aware.

## 2020-11-17 NOTE — Discharge Instructions (Signed)
Keep your hand elevated as much as possible.    Apply ice as needed.    Continue your postoperative instructions.    Return if you are having any problems.

## 2020-11-17 NOTE — ED Notes (Signed)
Pt discharged by provider, but pt left without discharge instructions.

## 2020-11-17 NOTE — ED Notes (Signed)
Pt ambulated to restroom without assistance. Pt steady on his feet

## 2020-11-17 NOTE — ED Provider Notes (Signed)
Care assumed from Dr. Francia Greaves, patient with wrist surgery earlier today and nerve block but comes in with severe pain in his arm.  He is received an injection of hydromorphone and will need to be reassessed.  1:41 AM He had reasonably good pain control with intramuscular hydromorphone.  Neurovascular status is intact.  He is felt to be safe for discharge with close follow-up with his hand surgeon.   Delora Fuel, MD [birth year] [phone]

## 2020-11-17 NOTE — ED Notes (Signed)
Pt states " I'm hot and ready to go, when is the doctor coming back in here , because just laying around here isn't going to help my problem." Nurse assured pt that provider would be in for reassessment as soon as they can.

## 2020-12-15 ENCOUNTER — Ambulatory Visit: Payer: Medicaid Other | Admitting: Cardiology

## 2020-12-28 NOTE — Progress Notes (Signed)
HPI: Follow-up coronary artery disease.Note h/o substance abuse.Patientpreviously admitted with non-ST elevation myocardial infarction. Echocardiogram July 2017 showed ejection fraction 35-40% and grade 2 diastolic dysfunction. Cardiac catheterization July 2017 showed mild LV systolic dysfunction, 32% second marginal. Patient had PCI of the second marginal with a drug-eluting stent. Patient had cough with ACEI. Nuclear study February 2019 showed ejection fraction 34%, prior inferior infarct but no ischemia. Most recent echocardiogram March 2022 showed ejection fraction 40 to 44%, grade 1 diastolic dysfunction, mild mitral regurgitation. Since last seen, patient denies dyspnea, chest pain, palpitations or syncope.  Current Outpatient Medications  Medication Sig Dispense Refill  . aspirin EC 81 MG tablet Take 81 mg by mouth daily. Swallow whole.    . carvedilol (COREG) 6.25 MG tablet TAKE 1 TABLET BY MOUTH 2 TIMES DAILY 180 tablet 3  . chlorthalidone (HYGROTON) 25 MG tablet TAKE 1/2 TABLET BY MOUTH EVERY DAY 45 tablet 3  . D3 SUPER STRENGTH 2000 units CAPS Take 2,000 Units by mouth daily.  0  . ezetimibe (ZETIA) 10 MG tablet Take 1 tablet (10 mg total) by mouth daily. 90 tablet 3  . losartan (COZAAR) 50 MG tablet Take 1 tablet (50 mg total) by mouth daily. 90 tablet 3  . nitroGLYCERIN (NITROSTAT) 0.4 MG SL tablet Place 1 tablet (0.4 mg total) under the tongue every 5 (five) minutes as needed for chest pain. 25 tablet 3  . pantoprazole (PROTONIX) 20 MG tablet TAKE 1 TABLET BY MOUTH EVERY DAY 30 tablet 3  . rosuvastatin (CRESTOR) 40 MG tablet Take 1 tablet (40 mg total) by mouth daily. 90 tablet 3  . sildenafil (REVATIO) 20 MG tablet Take 20 mg by mouth daily as needed.    . sucralfate (CARAFATE) 1 g tablet Take 1 tablet (1 g total) by mouth 4 (four) times daily -  with meals and at bedtime. 21 tablet 0   Current Facility-Administered Medications  Medication Dose Route Frequency Provider  Last Rate Last Admin  . nitroGLYCERIN (NITROSTAT) SL tablet 0.4 mg  0.4 mg Sublingual Q5 min PRN Elouise Munroe, MD   0.4 mg at 09/30/20 1623     Past Medical History:  Diagnosis Date  . CAD (coronary artery disease) cardiologist-- dr Stanford Breed   a. 03/2016 NSTEMI/PCI: LM nl, LAD nl, RI nl, LCX nl, OM2 99 ( 2.75 x 16 Synergy DES), RCA nl, EF 45-50%.  Marland Kitchen ETOH abuse   . GERD (gastroesophageal reflux disease)   . History of cocaine abuse ([place])    per pt none since 2017  . History of non-ST elevation myocardial infarction (NSTEMI) 03/14/2016   s/p  PCI and DES x1  . Hyperlipidemia   . Hypertension   . Hypertensive heart disease   . Ischemic cardiomyopathy    a. 03/2016 Echo: EF 35-40%, diff H, sev inflat HK, Gr2 DD;  b. 03/2016 EF 45-50% by LV gram.;  echo 10-24-2018, ef 45-50%  . Myocardial infarction [facility]) 2017   NSTEMI   . OA (osteoarthritis)    left knee  . S/P drug eluting coronary stent placement 03/14/2016   DES x1 to OM1  . Tobacco abuse   . Wears dentures    upper  . Wears partial dentures    lower    Past Surgical History:  Procedure Laterality Date  . CARDIAC CATHETERIZATION N/A 03/14/2016   Procedure: Left Heart Cath and Coronary Angiography;  Surgeon: Burnell Blanks, MD;  Location: [place] [facility] LAB;  Service: Cardiovascular;  Laterality: N/A;  . CORONARY ANGIOPLASTY WITH STENT PLACEMENT    . FACIAL COSMETIC SURGERY     at age 87 yrs old  . LACERATION REPAIR  age 77   face around eye  . PARTIAL KNEE ARTHROPLASTY Left 11/13/2018   Procedure: UNICOMPARTMENTAL KNEE;  Surgeon: Renette Butters, MD;  Location: [facility];  Service: Orthopedics;  Laterality: Left;  . WRIST SURGERY Left 01/31/2020    Social History   Socioeconomic History  . Marital status: Legally Separated    Spouse name: Not on file  . Number of children: 8  . Years of education: Not on file  . Highest education level: Not on file  Occupational History  . Not on file  Tobacco Use  .  Smoking status: Current Some Day Smoker    Packs/day: 1.00    Years: 35.00    Pack years: 35.00    Types: Cigarettes  . Smokeless tobacco: Never Used  . Tobacco comment: some days less   Vaping Use  . Vaping Use: Never used  Substance and Sexual Activity  . Alcohol use: Yes    Alcohol/week: 0.0 standard drinks    Comment: pint liquor (gin) daily; 11-06-2018 last two days had a airplane bottle each daily, and on weekend one beer and couple of drink  . Drug use: Not Currently    Comment: 11-06-2018 per pt last cocaine "when I had my heart attack"  2017  . Sexual activity: Not Currently  Other Topics Concern  . Not on file  Social History Narrative  . Not on file   Social Determinants of Health   Financial Resource Strain: Not on file  Food Insecurity: Not on file  Transportation Needs: Not on file  Physical Activity: Not on file  Stress: Not on file  Social Connections: Not on file  Intimate Partner Violence: Not on file    Family History  Problem Relation Age of Onset  . Heart attack Father   . Heart attack Sister   . Colon polyps Maternal Uncle   . Colon cancer Neg Hx   . Esophageal cancer Neg Hx   . Stomach cancer Neg Hx   . Rectal cancer Neg Hx     ROS: no fevers or chills, productive cough, hemoptysis, dysphasia, odynophagia, melena, hematochezia, dysuria, hematuria, rash, seizure activity, orthopnea, PND, pedal edema, claudication. Remaining systems are negative.  Physical Exam: Well-developed well-nourished in no acute distress.  Skin is warm and dry.  HEENT is normal.  Neck is supple.  Chest is clear to auscultation with normal expansion.  Cardiovascular exam is regular rate and rhythm.  Abdominal exam nontender or distended. No masses palpated. Extremities show no edema. neuro grossly intact  A/P  1 coronary artery disease-patient denies chest pain.  Continue aspirin and statin.  2 ischemic cardiomyopathy-continue ARB and beta-blocker.  LV function  mildly reduced on most recent echocardiogram.  3 hypertension-blood pressure elevated; increase losartan to 100 mg daily.  Check potassium and renal function in 1 week.  Follow blood pressure and adjust medications as needed.  4 hyperlipidemia-continue statin.  Check lipids and liver.  5 tobacco abuse-patient counseled on discontinuing.  Kirk Ruths, MD

## 2021-01-04 ENCOUNTER — Encounter: Payer: Self-pay | Admitting: Cardiology

## 2021-01-04 ENCOUNTER — Other Ambulatory Visit: Payer: Self-pay

## 2021-01-04 ENCOUNTER — Ambulatory Visit (INDEPENDENT_AMBULATORY_CARE_PROVIDER_SITE_OTHER): Payer: Medicaid Other | Admitting: Cardiology

## 2021-01-04 VITALS — BP 142/90 | HR 70 | Ht 66.0 in | Wt 180.3 lb

## 2021-01-04 DIAGNOSIS — I10 Essential (primary) hypertension: Secondary | ICD-10-CM

## 2021-01-04 DIAGNOSIS — E785 Hyperlipidemia, unspecified: Secondary | ICD-10-CM | POA: Diagnosis not present

## 2021-01-04 DIAGNOSIS — I2510 Atherosclerotic heart disease of native coronary artery without angina pectoris: Secondary | ICD-10-CM

## 2021-01-04 DIAGNOSIS — Z720 Tobacco use: Secondary | ICD-10-CM

## 2021-01-04 DIAGNOSIS — I255 Ischemic cardiomyopathy: Secondary | ICD-10-CM | POA: Diagnosis not present

## 2021-01-04 MED ORDER — LOSARTAN POTASSIUM 100 MG PO TABS: 100.0000 mg | ORAL_TABLET | Freq: Every day | ORAL | 3 refills | Status: DC

## 2021-01-04 NOTE — Patient Instructions (Signed)
Medication Instructions:   INCREASE LOSARTAN TO 100 MG ONCE DAILY=  2 OF THE 50 MG TABLETS ONCE DAILY  *If you need a refill on your cardiac medications before your next appointment, please call your pharmacy*   Lab Work:  Your physician recommends that you return for lab work in: ONE WEEK=FASTING  If you have labs (blood work) drawn today and your tests are completely normal, you will receive your results only by: Marland Kitchen MyChart Message (if you have MyChart) OR . A paper copy in the mail If you have any lab test that is abnormal or we need to change your treatment, we will call you to review the results.  Follow-Up: At [facility], you and your health needs are our priority.  As part of our continuing mission to provide you with exceptional heart care, we have created designated Provider Care Teams.  These Care Teams include your primary Cardiologist (physician) and Advanced Practice Providers (APPs -  Physician Assistants and Nurse Practitioners) who all work together to provide you with the care you need, when you need it.  We recommend signing up for the patient portal called "MyChart".  Sign up information is provided on this After Visit Summary.  MyChart is used to connect with patients for Virtual Visits (Telemedicine).  Patients are able to view lab/test results, encounter notes, upcoming appointments, etc.  Non-urgent messages can be sent to your provider as well.   To learn more about what you can do with MyChart, go to [email or web address].    Your next appointment:   12 month(s)  The format for your next appointment:   In Person  Provider:   Kirk Ruths, MD

## 2021-01-20 LAB — HEPATIC FUNCTION PANEL
ALT: 20 IU/L (ref 0–44)
AST: 26 IU/L (ref 0–40)
Albumin: 4.8 g/dL (ref 3.8–4.8)
Alkaline Phosphatase: 84 IU/L (ref 44–121)
Bilirubin Total: 0.6 mg/dL (ref 0.0–1.2)
Bilirubin, Direct: 0.17 mg/dL (ref 0.00–0.40)
Total Protein: 7 g/dL (ref 6.0–8.5)

## 2021-01-20 LAB — LIPID PANEL
Chol/HDL Ratio: 3.4 ratio (ref 0.0–5.0)
Cholesterol, Total: 169 mg/dL (ref 100–199)
HDL: 50 mg/dL (ref >39)
LDL Chol Calc (NIH): 94 mg/dL (ref 0–99)
Triglycerides: 142 mg/dL (ref 0–149)
VLDL Cholesterol Cal: 25 mg/dL (ref 5–40)

## 2021-01-26 ENCOUNTER — Telehealth: Payer: Self-pay | Admitting: Cardiology

## 2021-01-26 DIAGNOSIS — E785 Hyperlipidemia, unspecified: Secondary | ICD-10-CM

## 2021-01-26 NOTE — Telephone Encounter (Signed)
Minus Breeding, MD  01/23/2021 8:52 AM EDT      His LDL is still not at target. If he is taking the Crestor then he needs to be switched to PCSK9 inhibitor. Refer to the [facility]. Call Lucas Stark with the results    Spoke with pt, aware of recommendations. Referral placed.

## 2021-01-26 NOTE — Telephone Encounter (Signed)
Pt is returning call in regards to his recent Lab work results. Please advise

## 2021-01-29 ENCOUNTER — Other Ambulatory Visit: Payer: Self-pay

## 2021-01-29 DIAGNOSIS — E7800 Pure hypercholesterolemia, unspecified: Secondary | ICD-10-CM

## 2021-01-29 DIAGNOSIS — I2510 Atherosclerotic heart disease of native coronary artery without angina pectoris: Secondary | ICD-10-CM

## 2021-01-29 DIAGNOSIS — E785 Hyperlipidemia, unspecified: Secondary | ICD-10-CM

## 2021-02-25 ENCOUNTER — Ambulatory Visit: Payer: Medicaid Other

## 2021-02-25 ENCOUNTER — Encounter: Payer: Self-pay | Admitting: Cardiology

## 2021-02-25 LAB — LIPID PANEL
Chol/HDL Ratio: 2.7 ratio (ref 0.0–5.0)
Cholesterol, Total: 129 mg/dL (ref 100–199)
HDL: 48 mg/dL (ref >39)
LDL Chol Calc (NIH): 64 mg/dL (ref 0–99)
Triglycerides: 91 mg/dL (ref 0–149)
VLDL Cholesterol Cal: 17 mg/dL (ref 5–40)

## 2021-02-25 LAB — COMPREHENSIVE METABOLIC PANEL
ALT: 23 IU/L (ref 0–44)
AST: 26 IU/L (ref 0–40)
Albumin/Globulin Ratio: 1.9 (ref 1.2–2.2)
Albumin: 4.5 g/dL (ref 3.8–4.8)
Alkaline Phosphatase: 76 IU/L (ref 44–121)
BUN/Creatinine Ratio: 15 (ref 10–24)
BUN: 15 mg/dL (ref 8–27)
Bilirubin Total: 0.6 mg/dL (ref 0.0–1.2)
CO2: 23 mmol/L (ref 20–29)
Calcium: 9.4 mg/dL (ref 8.6–10.2)
Chloride: 103 mmol/L (ref 96–106)
Creatinine, Ser: 1.01 mg/dL (ref 0.76–1.27)
Globulin, Total: 2.4 g/dL (ref 1.5–4.5)
Glucose: 100 mg/dL — ABNORMAL HIGH (ref 65–99)
Potassium: 4.1 mmol/L (ref 3.5–5.2)
Sodium: 141 mmol/L (ref 134–144)
Total Protein: 6.9 g/dL (ref 6.0–8.5)
eGFR: 84 mL/min/{1.73_m2} (ref >59)

## 2021-02-25 NOTE — Progress Notes (Deleted)
Patient ID: Lucas Stark                 DOB: 1956/02/24                    MRN: [id number]     HPI: Lucas Stark is a 65 y.o. male patient referred to lipid clinic by Dr. Stanford Breed. PMH is significant for hyperlipidemia, hypertension, ischemic cardiomyopathy, NSTEMI s/p PCI with DES x1, EtOH abuse,   Current Medications:  Ezetimibe 10mg  daily Rosuvastatin 40mg  daily  Intolerances:   LDL goal: < 70mg /dL  Diet:   Exercise:   Family History:   Social History: alcohol and cocaine use??  Labs:  Past Medical History:  Diagnosis Date   CAD (coronary artery disease) cardiologist-- dr Stanford Breed   a. 03/2016 NSTEMI/PCI: LM nl, LAD nl, RI nl, LCX nl, OM2 99 ( 2.75 x 16 Synergy DES), RCA nl, EF 45-50%.   ETOH abuse    GERD (gastroesophageal reflux disease)    History of cocaine abuse ([place])    per pt none since 2017   History of non-ST elevation myocardial infarction (NSTEMI) 03/14/2016   s/p  PCI and DES x1   Hyperlipidemia    Hypertension    Hypertensive heart disease    Ischemic cardiomyopathy    a. 03/2016 Echo: EF 35-40%, diff H, sev inflat HK, Gr2 DD;  b. 03/2016 EF 45-50% by LV gram.;  echo 10-24-2018, ef 45-50%   Myocardial infarction ([place]) 2017   NSTEMI    OA (osteoarthritis)    left knee   S/P drug eluting coronary stent placement 03/14/2016   DES x1 to OM1   Tobacco abuse    Wears dentures    upper   Wears partial dentures    lower    Current Outpatient Medications on File Prior to Visit  Medication Sig Dispense Refill   aspirin EC 81 MG tablet Take 81 mg by mouth daily. Swallow whole.     carvedilol (COREG) 6.25 MG tablet TAKE 1 TABLET BY MOUTH 2 TIMES DAILY 180 tablet 3   chlorthalidone (HYGROTON) 25 MG tablet TAKE 1/2 TABLET BY MOUTH EVERY DAY 45 tablet 3   D3 SUPER STRENGTH 2000 units CAPS Take 2,000 Units by mouth daily.  0   ezetimibe (ZETIA) 10 MG tablet Take 1 tablet (10 mg total) by mouth daily. 90 tablet 3   losartan (COZAAR) 100 MG tablet Take 1  tablet (100 mg total) by mouth daily. 90 tablet 3   nitroGLYCERIN (NITROSTAT) 0.4 MG SL tablet Place 1 tablet (0.4 mg total) under the tongue every 5 (five) minutes as needed for chest pain. 25 tablet 3   pantoprazole (PROTONIX) 20 MG tablet TAKE 1 TABLET BY MOUTH EVERY DAY 30 tablet 3   rosuvastatin (CRESTOR) 40 MG tablet Take 1 tablet (40 mg total) by mouth daily. 90 tablet 3   sildenafil (REVATIO) 20 MG tablet Take 20 mg by mouth daily as needed.     sucralfate (CARAFATE) 1 g tablet Take 1 tablet (1 g total) by mouth 4 (four) times daily -  with meals and at bedtime. 21 tablet 0   Current Facility-Administered Medications on File Prior to Visit  Medication Dose Route Frequency Provider Last Rate Last Admin   nitroGLYCERIN (NITROSTAT) SL tablet 0.4 mg  0.4 mg Sublingual Q5 min PRN Cherlynn Kaiser A, MD   0.4 mg at 09/30/20 1623    Allergies  Allergen Reactions   Other Itching  Prescription Sleeping pills, Unsure of the name of the sleeping pill    No problem-specific Assessment & Plan notes found for this encounter.    Thaniel Coluccio Rodriguez-Guzman PharmD, BCPS, [place] [address] 02/25/2021 9:54 AM

## 2021-03-16 ENCOUNTER — Other Ambulatory Visit: Payer: Self-pay

## 2021-03-16 ENCOUNTER — Ambulatory Visit (INDEPENDENT_AMBULATORY_CARE_PROVIDER_SITE_OTHER): Payer: Medicaid Other | Admitting: Pharmacist

## 2021-03-16 VITALS — BP 138/92 | HR 63 | Resp 16 | Ht 66.0 in | Wt 179.2 lb

## 2021-03-16 DIAGNOSIS — E7800 Pure hypercholesterolemia, unspecified: Secondary | ICD-10-CM

## 2021-03-16 DIAGNOSIS — Z9861 Coronary angioplasty status: Secondary | ICD-10-CM

## 2021-03-16 DIAGNOSIS — I2510 Atherosclerotic heart disease of native coronary artery without angina pectoris: Secondary | ICD-10-CM

## 2021-03-16 NOTE — Patient Instructions (Addendum)
It was nice meeting you today  We would like to have your LDL (bad cholesterol) less than 55  Continue your rosuvastatin 40mg  daily and your ezetimibe 10mg  once daily  I will do a prior authorization for a new medication called Nexletol which you will also take once a day  Try to cut down on your sugary drinks  Please call with any questions!  [name], PharmD, BCACP, [place], [place] [address] [place], [place] [address] Phone: [phone]; Fax: [phone] 03/16/2021 3:51 PM

## 2021-03-16 NOTE — Progress Notes (Signed)
Patient ID: Lucas Stark                 DOB: 1955/12/18                    MRN: [id number]     HPI: Lucas Stark is a 65 y.o. male patient referred to lipid clinic by Dr Percival Spanish. PMH is significant for HTN, HLD, CAD s/p percutaneous coronary angioplasty, history of NSTEMI, and tobacco use.  Patient;s LDL has slowly decreased on rosuvastatin 40mg  and Zetia 10mg .  Patient presents today in good spirits.  Is tolerating Crestor and Zetia well with no adverse effects.  Reports medication compliance.  Is not interested in any injectable medications.  Reports he eats what his wife prepares for him.  Typically chicken, pork chops, eggs, vegetables.  Is physically active when he does yard work.  Does not know of any significant family history of CAD.   Is on Chantix and it has helped him cut down on smoking.  Smokes between 1-4 cigarettes a day. Drinks vodka with mountain Dew.  Drinks many sugary fruit juices.  Current Medications: rosuvastatin 40mg , zetia 10mg  Intolerances: n/a Risk Factors: History of NSTEMI, CAD, HTN, HLD, smoking LDL goal: <55  Labs: TC 129, Trigs 91, HDL 48, LDL 64 (02/25/21 on rosuvastatin 40mg  and Zetia 10mg )  Past Medical History:  Diagnosis Date   CAD (coronary artery disease) cardiologist-- dr Stanford Breed   a. 03/2016 NSTEMI/PCI: LM nl, LAD nl, RI nl, LCX nl, OM2 99 ( 2.75 x 16 Synergy DES), RCA nl, EF 45-50%.   ETOH abuse    GERD (gastroesophageal reflux disease)    History of cocaine abuse ([place])    per pt none since 2017   History of non-ST elevation myocardial infarction (NSTEMI) 03/14/2016   s/p  PCI and DES x1   Hyperlipidemia    Hypertension    Hypertensive heart disease    Ischemic cardiomyopathy    a. 03/2016 Echo: EF 35-40%, diff H, sev inflat HK, Gr2 DD;  b. 03/2016 EF 45-50% by LV gram.;  echo 10-24-2018, ef 45-50%   Myocardial infarction ([place]) 2017   NSTEMI    OA (osteoarthritis)    left knee   S/P drug eluting coronary stent placement 03/14/2016    DES x1 to OM1   Tobacco abuse    Wears dentures    upper   Wears partial dentures    lower    Current Outpatient Medications on File Prior to Visit  Medication Sig Dispense Refill   aspirin EC 81 MG tablet Take 81 mg by mouth daily. Swallow whole.     carvedilol (COREG) 6.25 MG tablet TAKE 1 TABLET BY MOUTH 2 TIMES DAILY 180 tablet 3   chlorthalidone (HYGROTON) 25 MG tablet TAKE 1/2 TABLET BY MOUTH EVERY DAY 45 tablet 3   D3 SUPER STRENGTH 2000 units CAPS Take 2,000 Units by mouth daily.  0   ezetimibe (ZETIA) 10 MG tablet Take 1 tablet (10 mg total) by mouth daily. 90 tablet 3   losartan (COZAAR) 100 MG tablet Take 1 tablet (100 mg total) by mouth daily. 90 tablet 3   nitroGLYCERIN (NITROSTAT) 0.4 MG SL tablet Place 1 tablet (0.4 mg total) under the tongue every 5 (five) minutes as needed for chest pain. 25 tablet 3   pantoprazole (PROTONIX) 20 MG tablet TAKE 1 TABLET BY MOUTH EVERY DAY 30 tablet 3   rosuvastatin (CRESTOR) 40 MG tablet Take 1 tablet (40 mg  total) by mouth daily. 90 tablet 3   sildenafil (REVATIO) 20 MG tablet Take 20 mg by mouth daily as needed.     sucralfate (CARAFATE) 1 g tablet Take 1 tablet (1 g total) by mouth 4 (four) times daily -  with meals and at bedtime. 21 tablet 0   Current Facility-Administered Medications on File Prior to Visit  Medication Dose Route Frequency Provider Last Rate Last Admin   nitroGLYCERIN (NITROSTAT) SL tablet 0.4 mg  0.4 mg Sublingual Q5 min PRN Elouise Munroe, MD   0.4 mg at 09/30/20 1623    Allergies  Allergen Reactions   Other Itching    Prescription Sleeping pills, Unsure of the name of the sleeping pill    Assessment/Plan:  1. Hyperlipidemia - Patient LDL 64 which is at goal of <70, but above more aggressive goal of <55.  Aggressive goal considered due to patients history of MI and stent placement.  Next steps would be PCSK9i or nexletol.  Patient not interested in any injectable medications.  Will pursue prior  authorization for Nexletol. Will contact patient if approved.  Recommended patient cut down on sugary drinks such as sodas and fruit juices.    Continue rosuvastatin 40mg  daily Continue Zetia 10mg  daily  [name], PharmD, BCACP, CDCES, [facility] [address], [place], [place] [address] Phone: [phone]; Fax: [phone] 03/16/2021 4:21 PM

## 2021-03-18 ENCOUNTER — Telehealth: Payer: Self-pay | Admitting: Pharmacist

## 2021-03-18 DIAGNOSIS — E785 Hyperlipidemia, unspecified: Secondary | ICD-10-CM

## 2021-03-18 DIAGNOSIS — I2510 Atherosclerotic heart disease of native coronary artery without angina pectoris: Secondary | ICD-10-CM

## 2021-03-18 MED ORDER — NEXLETOL 180 MG PO TABS: 1.0000 | ORAL_TABLET | Freq: Every day | ORAL | 1 refills | Status: DC

## 2021-03-18 NOTE — Telephone Encounter (Signed)
PA for Nexletol approved through 03/16/22.  Attempted to call patient to let him know.  VM that picked up said name was "Altha Harm."  Left message asking for call back.  Rx sent to pharmacy.

## 2021-04-12 ENCOUNTER — Other Ambulatory Visit: Payer: Self-pay | Admitting: Cardiology

## 2021-04-12 ENCOUNTER — Other Ambulatory Visit: Payer: Self-pay | Admitting: General Practice

## 2021-04-28 ENCOUNTER — Observation Stay (HOSPITAL_COMMUNITY)
Admission: EM | Admit: 2021-04-28 | Discharge: 2021-04-29 | Disposition: A | Discharge status: Home / Self Care | Payer: Medicaid Other | Attending: Emergency Medicine | Admitting: Emergency Medicine

## 2021-04-28 ENCOUNTER — Encounter (HOSPITAL_COMMUNITY): Payer: Self-pay

## 2021-04-28 ENCOUNTER — Ambulatory Visit (HOSPITAL_COMMUNITY)
Admission: EM | Admit: 2021-04-28 | Discharge: 2021-04-28 | Disposition: A | Discharge status: Home / Self Care | Payer: Medicaid Other

## 2021-04-28 ENCOUNTER — Other Ambulatory Visit: Payer: Self-pay

## 2021-04-28 DIAGNOSIS — K37 Unspecified appendicitis: Secondary | ICD-10-CM | POA: Diagnosis present

## 2021-04-28 DIAGNOSIS — R109 Unspecified abdominal pain: Secondary | ICD-10-CM | POA: Diagnosis present

## 2021-04-28 DIAGNOSIS — I2510 Atherosclerotic heart disease of native coronary artery without angina pectoris: Secondary | ICD-10-CM | POA: Insufficient documentation

## 2021-04-28 DIAGNOSIS — Z79899 Other long term (current) drug therapy: Secondary | ICD-10-CM | POA: Diagnosis not present

## 2021-04-28 DIAGNOSIS — Z20822 Contact with and (suspected) exposure to covid-19: Secondary | ICD-10-CM | POA: Diagnosis not present

## 2021-04-28 DIAGNOSIS — F17210 Nicotine dependence, cigarettes, uncomplicated: Secondary | ICD-10-CM | POA: Diagnosis not present

## 2021-04-28 DIAGNOSIS — K3580 Unspecified acute appendicitis: Secondary | ICD-10-CM

## 2021-04-28 DIAGNOSIS — Z96652 Presence of left artificial knee joint: Secondary | ICD-10-CM | POA: Insufficient documentation

## 2021-04-28 DIAGNOSIS — Z7982 Long term (current) use of aspirin: Secondary | ICD-10-CM | POA: Insufficient documentation

## 2021-04-28 DIAGNOSIS — I10 Essential (primary) hypertension: Secondary | ICD-10-CM | POA: Diagnosis not present

## 2021-04-28 LAB — URINALYSIS, ROUTINE W REFLEX MICROSCOPIC
Bilirubin Urine: NEGATIVE
Glucose, UA: NEGATIVE mg/dL
Hgb urine dipstick: NEGATIVE
Ketones, ur: NEGATIVE mg/dL
Leukocytes,Ua: NEGATIVE
Nitrite: NEGATIVE
Protein, ur: NEGATIVE mg/dL
Specific Gravity, Urine: 1.014 (ref 1.005–1.030)
pH: 6 (ref 5.0–8.0)

## 2021-04-28 NOTE — ED Provider Notes (Signed)
[facility] EMERGENCY DEPARTMENT Provider Note   CSN: [id number] Arrival date & time: 04/28/21  1427     History Chief Complaint  Patient presents with   Abdominal Pain   Constipation    Lucas Stark is a 65 y.o. male.  Patient to ED for evaluation of right sided abdominal pain that started yesterday morning and has remained constant since onset. No nausea or vomiting. No fever. He took a laxative for possible constipation and has had several loose stools without relief of the pain. He reports loss of appetite and abdominal distention. No urinary symptoms, chest pain or SOB. No previous abdominal surgeries.   The history is provided by the patient. No language interpreter was used.  Abdominal Pain Associated symptoms: constipation   Associated symptoms: no chills, no dysuria, no fever, no nausea and no vomiting   Constipation Associated symptoms: abdominal pain   Associated symptoms: no back pain, no dysuria, no fever, no nausea and no vomiting       Past Medical History:  Diagnosis Date   CAD (coronary artery disease) cardiologist-- dr Stanford Breed   a. 03/2016 NSTEMI/PCI: LM nl, LAD nl, RI nl, LCX nl, OM2 99 ( 2.75 x 16 Synergy DES), RCA nl, EF 45-50%.   ETOH abuse    GERD (gastroesophageal reflux disease)    History of cocaine abuse ([place])    per pt none since 2017   History of non-ST elevation myocardial infarction (NSTEMI) 03/14/2016   s/p  PCI and DES x1   Hyperlipidemia    Hypertension    Hypertensive heart disease    Ischemic cardiomyopathy    a. 03/2016 Echo: EF 35-40%, diff H, sev inflat HK, Gr2 DD;  b. 03/2016 EF 45-50% by LV gram.;  echo 10-24-2018, ef 45-50%   Myocardial infarction ([place]) 2017   NSTEMI    OA (osteoarthritis)    left knee   S/P drug eluting coronary stent placement 03/14/2016   DES x1 to OM1   Tobacco abuse    Wears dentures    upper   Wears partial dentures    lower    Patient Active Problem List   Diagnosis Date Noted    Primary localized osteoarthritis of knee 11/13/2018   Primary osteoarthritis of left knee 10/24/2018   Knee effusion, left 08/15/2018   Hypertensive heart disease without CHF 03/15/2016   Dyslipidemia, goal LDL below 70 03/15/2016   CAD S/P percutaneous coronary angioplasty 03/15/2016   Cardiomyopathy, ischemic 03/15/2016   Status post coronary artery stent placement    History of non-ST elevation myocardial infarction (NSTEMI) 03/12/2016   Benign essential HTN 03/12/2016   Tobacco abuse 03/12/2016   Cocaine use 03/12/2016   Alcohol abuse 03/12/2016    Past Surgical History:  Procedure Laterality Date   CARDIAC CATHETERIZATION N/A 03/14/2016   Procedure: Left Heart Cath and Coronary Angiography;  Surgeon: Burnell Blanks, MD;  Location: [place] [facility];  Service: Cardiovascular;  Laterality: N/A;   CORONARY ANGIOPLASTY WITH STENT PLACEMENT     FACIAL COSMETIC SURGERY     at age 77 yrs old   [place]  age 52   face around eye   PARTIAL KNEE ARTHROPLASTY Left 11/13/2018   Procedure: UNICOMPARTMENTAL KNEE;  Surgeon: Renette Butters, MD;  Location: [facility];  Service: Orthopedics;  Laterality: Left;   WRIST SURGERY Left 01/31/2020       Family History  Problem Relation Age of Onset   Heart attack Father  Heart attack Sister    Colon polyps Maternal Uncle    Colon cancer Neg Hx    Esophageal cancer Neg Hx    Stomach cancer Neg Hx    Rectal cancer Neg Hx     Social History   Tobacco Use   Smoking status: Some Days    Packs/day: 1.00    Years: 35.00    Pack years: 35.00    Types: Cigarettes   Smokeless tobacco: Never   Tobacco comments:    some days less   Vaping Use   Vaping Use: Never used  Substance Use Topics   Alcohol use: Yes    Alcohol/week: 0.0 standard drinks    Comment: pint liquor (gin) daily; 11-06-2018 last two days had a airplane bottle each daily, and on weekend one beer and couple of drink   Drug use: Not Currently    Comment:  11-06-2018 per pt last cocaine "when I had my heart attack"  2017    Home Medications Prior to Admission medications   Medication Sig Start Date End Date Taking? Authorizing Provider  aspirin EC 81 MG tablet Take 81 mg by mouth daily. Swallow whole.    [provider]  Bempedoic Acid (NEXLETOL) 180 MG TABS Take 1 tablet by mouth daily. 03/18/21   Minus Breeding, MD  carvedilol (COREG) 6.25 MG tablet TAKE 1 TABLET BY MOUTH 2 TIMES DAILY 04/12/21   Lelon Perla, MD  chlorthalidone (HYGROTON) 25 MG tablet TAKE 1/2 TABLET BY MOUTH EVERY DAY 03/03/20   Lelon Perla, MD  D3 SUPER STRENGTH 2000 units CAPS Take 2,000 Units by mouth daily. 08/24/17   [provider]  ezetimibe (ZETIA) 10 MG tablet Take 1 tablet (10 mg total) by mouth daily. 02/26/20 03/23/22  Lelon Perla, MD  losartan (COZAAR) 100 MG tablet Take 1 tablet (100 mg total) by mouth daily. 01/04/21 04/04/21  Lelon Perla, MD  nitroGLYCERIN (NITROSTAT) 0.4 MG SL tablet Place 1 tablet (0.4 mg total) under the tongue every 5 (five) minutes as needed for chest pain. 10/10/17   Lendon Colonel, NP  pantoprazole (PROTONIX) 20 MG tablet TAKE 1 TABLET BY MOUTH EVERY DAY 04/12/21   Lelon Perla, MD  rosuvastatin (CRESTOR) 40 MG tablet Take 1 tablet (40 mg total) by mouth daily. 11/10/20 04/27/22  Minus Breeding, MD  sildenafil (REVATIO) 20 MG tablet Take 20 mg by mouth daily as needed.    [provider]  sucralfate (CARAFATE) 1 g tablet Take 1 tablet (1 g total) by mouth 4 (four) times daily -  with meals and at bedtime. 10/01/20   Dorie Rank, MD    Allergies    Advil [ibuprofen], Other, and Oxycodone  Review of Systems   Review of Systems  Constitutional:  Positive for appetite change. Negative for chills and fever.  HENT: Negative.    Respiratory: Negative.    Cardiovascular: Negative.   Gastrointestinal:  Positive for abdominal pain and constipation. Negative for blood in stool, nausea and  vomiting.  Genitourinary:  Negative for decreased urine volume, dysuria, scrotal swelling and testicular pain.  Musculoskeletal: Negative.  Negative for back pain.  Skin: Negative.   Neurological: Negative.    Physical Exam Updated Vital Signs BP (!) 145/93   Pulse (!) 59   Temp 98.4 F (36.9 C) (Oral)   Resp 18   SpO2 98%   Physical Exam Constitutional:      Appearance: He is well-developed.  HENT:  Head: Normocephalic.  Cardiovascular:     Rate and Rhythm: Normal rate and regular rhythm.     Heart sounds: No murmur heard. Pulmonary:     Effort: Pulmonary effort is normal.     Breath sounds: Normal breath sounds. No wheezing, rhonchi or rales.  Abdominal:     General: Bowel sounds are increased. There is distension.     Palpations: Abdomen is soft.     Tenderness: There is abdominal tenderness in the right upper quadrant, right lower quadrant and periumbilical area. There is no guarding or rebound.  Musculoskeletal:        General: Normal range of motion.     Cervical back: Normal range of motion and neck supple.  Skin:    General: Skin is warm and dry.  Neurological:     General: No focal deficit present.     Mental Status: He is alert and oriented to person, place, and time.    ED Results / Procedures / Treatments   Labs (all labs ordered are listed, but only abnormal results are displayed) Labs Reviewed  URINALYSIS, ROUTINE W REFLEX MICROSCOPIC  COMPREHENSIVE METABOLIC PANEL  CBC WITH DIFFERENTIAL/PLATELET  LIPASE, BLOOD    EKG None  Radiology No results found.  Procedures Procedures   Medications Ordered in ED Medications - No data to display  ED Course  I have reviewed the triage vital signs and the nursing notes.  Pertinent labs & imaging results that were available during my care of the patient were reviewed by me and considered in my medical decision making (see chart for details).    MDM Rules/Calculators/A&P                            Patient to ED with abdominal pain as detailed in the HPI. No fever, vomiting, bloody stools. Laxatives without relief.   Abdominal exam elicits tenderness but demonstrates no peritonitis. BS hyperactive. Generalized right sided tenderness.   Labs unremarkable. ON recheck the patient is sleeping, in NAD. VSS.   CT results c/w mild uncomplicated appendicitis. Surgery paged. H/O cocaine abuse, UDS pending.   Discussed with Dr. Donne Hazel, gen surg, who will see the patient.   Final Clinical Impression(s) / ED Diagnoses Final diagnoses:  None   Acute appendicitis, uncomplictaed  Rx / DC Orders ED Discharge Orders     None        Charlann Lange, PA-C 04/29/21 0421    Lennice Sites, DO 04/29/21 1551

## 2021-04-28 NOTE — ED Triage Notes (Signed)
Pt sent from UC for further eval of RUQ pain that started 2-3 days ago with constipation, pt states he drank some medicine last night to help have a BM and he did have one this morning but it still having severe pain.

## 2021-04-28 NOTE — ED Triage Notes (Signed)
Pt c/o constipation with associated abdominal pain primarily in the RLQ. States he went approx 2-3 days without a BM, last night he "drank something" that produced watery BM throughout the night.

## 2021-04-28 NOTE — Discharge Instructions (Addendum)
-  Please head to [facility] or [facility] ED for further evaluation and management of your abdominal pain.  Given the location of the pain, I am concerned that you are having an appendix or gallbladder issue.  Alternatively, a hernia could be causing the pain and the constipation.  You require imaging and work-up that we cannot perform at urgent care.  Please head straight there, if symptoms get worse on the way stop and call 911 immediately.

## 2021-04-28 NOTE — ED Provider Notes (Signed)
Emergency Medicine Provider Triage Evaluation Note  Lucas Stark , a 65 y.o. male  was evaluated in triage.  Pt complains of right-sided abdominal pain for the past 2 to 3 days.  Reports associated constipation.  Had a watery bowel movement this morning after taking a laxative but continues to have pain.  No vomiting or chest pain.  Review of Systems  Positive: Abdominal pain, constipation Negative: Vomiting or chest pain  Physical Exam  BP 118/81 (BP Location: Left Arm)   Pulse 66   Temp 98.4 F (36.9 C) (Oral)   Resp 16   SpO2 96%  Gen:   Awake, no distress   Resp:  Normal effort  MSK:   Moves extremities without difficulty  Other:  Tenderness in the right upper, right middle and right lower quadrants without rebound or guarding  Medical Decision Making  Medically screening exam initiated at 3:13 PM.  Appropriate orders placed.  Lucas Stark was informed that the remainder of the evaluation will be completed by another provider, this initial triage assessment does not replace that evaluation, and the importance of remaining in the ED until their evaluation is complete.  Lab work ordered consider imaging   Lucas Heady, PA-C 04/28/21 1514    Lucas Dessert, MD 05/01/21 [phone]

## 2021-04-28 NOTE — ED Provider Notes (Addendum)
[facility]    CSN: [id number] Arrival date & time: 04/28/21  1315      History   Chief Complaint Chief Complaint  Patient presents with   Constipation    HPI Lucas Stark is a 65 y.o. male presenting with abd pain and constipation. Medical history NSTEMI, GERD, hyperlipidemia, hypertension, etoh abuse, CAD.  States he went 3 days without a bowel movement, drank a laxative and this did produce a watery bowel movement during the night.  Describes severe right-sided abdominal pain, worst in the right lower quadrant.  Denies nausea, vomiting, melena, hematochezia.  He still has his appendix and gallbladder.  HPI  Past Medical History:  Diagnosis Date   CAD (coronary artery disease) cardiologist-- dr Stanford Breed   a. 03/2016 NSTEMI/PCI: LM nl, LAD nl, RI nl, LCX nl, OM2 99 ( 2.75 x 16 Synergy DES), RCA nl, EF 45-50%.   ETOH abuse    GERD (gastroesophageal reflux disease)    History of cocaine abuse ([place])    per pt none since 2017   History of non-ST elevation myocardial infarction (NSTEMI) 03/14/2016   s/p  PCI and DES x1   Hyperlipidemia    Hypertension    Hypertensive heart disease    Ischemic cardiomyopathy    a. 03/2016 Echo: EF 35-40%, diff H, sev inflat HK, Gr2 DD;  b. 03/2016 EF 45-50% by LV gram.;  echo 10-24-2018, ef 45-50%   Myocardial infarction ([place]) 2017   NSTEMI    OA (osteoarthritis)    left knee   S/P drug eluting coronary stent placement 03/14/2016   DES x1 to OM1   Tobacco abuse    Wears dentures    upper   Wears partial dentures    lower    Patient Active Problem List   Diagnosis Date Noted   Appendicitis 04/29/2021   Primary localized osteoarthritis of knee 11/13/2018   Primary osteoarthritis of left knee 10/24/2018   Knee effusion, left 08/15/2018   Hypertensive heart disease without CHF 03/15/2016   Dyslipidemia, goal LDL below 70 03/15/2016   CAD S/P percutaneous coronary angioplasty 03/15/2016   Cardiomyopathy, ischemic 03/15/2016    Status post coronary artery stent placement    History of non-ST elevation myocardial infarction (NSTEMI) 03/12/2016   Benign essential HTN 03/12/2016   Tobacco abuse 03/12/2016   Cocaine use 03/12/2016   Alcohol abuse 03/12/2016    Past Surgical History:  Procedure Laterality Date   CARDIAC CATHETERIZATION N/A 03/14/2016   Procedure: Left Heart Cath and Coronary Angiography;  Surgeon: Burnell Blanks, MD;  Location: [facility];  Service: Cardiovascular;  Laterality: N/A;   CORONARY ANGIOPLASTY WITH STENT PLACEMENT     FACIAL COSMETIC SURGERY     at age 83 yrs old   [place]  age 39   face around eye   PARTIAL KNEE ARTHROPLASTY Left 11/13/2018   Procedure: UNICOMPARTMENTAL KNEE;  Surgeon: Renette Butters, MD;  Location: [facility];  Service: Orthopedics;  Laterality: Left;   WRIST SURGERY Left 01/31/2020       Home Medications    Prior to Admission medications   Medication Sig Start Date End Date Taking? Authorizing Provider  acetaminophen (TYLENOL) 500 MG tablet Take 1,000 mg by mouth every 6 (six) hours as needed for moderate pain or headache.    [provider]  aspirin EC 81 MG tablet Take 81 mg by mouth daily. Swallow whole.    [provider]  Bempedoic Acid (NEXLETOL) 180  MG TABS Take 1 tablet by mouth daily. Patient taking differently: Take 180 mg by mouth daily. 03/18/21   Minus Breeding, MD  carvedilol (COREG) 6.25 MG tablet TAKE 1 TABLET BY MOUTH 2 TIMES DAILY Patient taking differently: Take 6.25 mg by mouth 2 (two) times daily with a meal. 04/12/21   Crenshaw, Denice Bors, MD  chlorthalidone (HYGROTON) 25 MG tablet TAKE 1/2 TABLET BY MOUTH EVERY DAY Patient taking differently: Take 12.5 mg by mouth daily. 03/03/20   Lelon Perla, MD  D3 SUPER STRENGTH 2000 units CAPS Take 2,000 Units by mouth daily. 08/24/17   [provider]  ezetimibe (ZETIA) 10 MG tablet Take 1 tablet (10 mg total) by mouth daily. Patient not taking: No  sig reported 02/26/20 03/23/22  Lelon Perla, MD  losartan (COZAAR) 100 MG tablet Take 1 tablet (100 mg total) by mouth daily. 01/04/21 04/28/21  Lelon Perla, MD  nitroGLYCERIN (NITROSTAT) 0.4 MG SL tablet Place 1 tablet (0.4 mg total) under the tongue every 5 (five) minutes as needed for chest pain. 10/10/17   Lendon Colonel, NP  pantoprazole (PROTONIX) 20 MG tablet TAKE 1 TABLET BY MOUTH EVERY DAY Patient taking differently: Take 20 mg by mouth daily. 04/12/21   Lelon Perla, MD  rosuvastatin (CRESTOR) 40 MG tablet Take 1 tablet (40 mg total) by mouth daily. Patient not taking: No sig reported 11/10/20 04/27/22  Minus Breeding, MD  sildenafil (REVATIO) 20 MG tablet Take 20 mg by mouth daily as needed (ed).    [provider]  sucralfate (CARAFATE) 1 g tablet Take 1 tablet (1 g total) by mouth 4 (four) times daily -  with meals and at bedtime. Patient not taking: No sig reported 10/01/20   Dorie Rank, MD    Family History Family History  Problem Relation Age of Onset   Heart attack Father    Heart attack Sister    Colon polyps Maternal Uncle    Colon cancer Neg Hx    Esophageal cancer Neg Hx    Stomach cancer Neg Hx    Rectal cancer Neg Hx     Social History Social History   Tobacco Use   Smoking status: Some Days    Packs/day: 1.00    Years: 35.00    Pack years: 35.00    Types: Cigarettes   Smokeless tobacco: Never   Tobacco comments:    some days less   Vaping Use   Vaping Use: Never used  Substance Use Topics   Alcohol use: Yes    Alcohol/week: 0.0 standard drinks    Comment: pint liquor (gin) daily; 11-06-2018 last two days had a airplane bottle each daily, and on weekend one beer and couple of drink   Drug use: Not Currently    Comment: 11-06-2018 per pt last cocaine "when I had my heart attack"  2017     Allergies   Advil [ibuprofen], Other, and Oxycodone   Review of Systems Review of Systems  Constitutional:  Negative for appetite  change, chills, diaphoresis, fever and unexpected weight change.  HENT:  Negative for congestion, ear pain, sinus pressure, sinus pain, sneezing, sore throat and trouble swallowing.   Respiratory:  Negative for cough, chest tightness and shortness of breath.   Cardiovascular:  Negative for chest pain.  Gastrointestinal:  Positive for abdominal pain. Negative for abdominal distention, anal bleeding, blood in stool, constipation, diarrhea, nausea, rectal pain and vomiting.  Genitourinary:  Negative for dysuria, flank pain, frequency and  urgency.  Musculoskeletal:  Negative for back pain and myalgias.  Neurological:  Negative for dizziness, light-headedness and headaches.  All other systems reviewed and are negative.   Physical Exam Triage Vital Signs ED Triage Vitals [04/28/21 1407]  Enc Vitals Group     BP (!) 142/83     Pulse Rate 69     Resp 18     Temp 99.2 F (37.3 C)     Temp Source Oral     SpO2 96 %     Weight      Height      Head Circumference      Peak Flow      Pain Score      Pain Loc      Pain Edu?      Excl. in [place]?    No data found.  Updated Vital Signs BP (!) 142/83 (BP Location: Right Arm)   Pulse 69   Temp 99.2 F (37.3 C) (Oral)   Resp 18   SpO2 96%   Visual Acuity Right Eye Distance:   Left Eye Distance:   Bilateral Distance:    Right Eye Near:   Left Eye Near:    Bilateral Near:     Physical Exam Vitals reviewed.  Constitutional:      General: He is not in acute distress.    Appearance: Normal appearance. He is not ill-appearing.  HENT:     Head: Normocephalic and atraumatic.     Mouth/Throat:     Mouth: Mucous membranes are moist.     Comments: Moist mucous membranes Eyes:     Extraocular Movements: Extraocular movements intact.     Pupils: Pupils are equal, round, and reactive to light.  Cardiovascular:     Rate and Rhythm: Normal rate and regular rhythm.     Heart sounds: Normal heart sounds.  Pulmonary:     Effort: Pulmonary  effort is normal.     Breath sounds: Normal breath sounds. No wheezing, rhonchi or rales.  Abdominal:     General: Bowel sounds are decreased. There is no distension.     Palpations: Abdomen is soft. There is no mass.     Tenderness: There is abdominal tenderness in the right lower quadrant. There is guarding and rebound. There is no right CVA tenderness or left CVA tenderness. Positive signs include Murphy's sign and McBurney's sign. Negative signs include Rovsing's sign.     Comments: Patient visibly uncomfortable.  BS decreased throughout. Significant RLQ and RUQ tenderness to palpation with guarding and some rebound.  Skin:    General: Skin is warm.     Capillary Refill: Capillary refill takes less than 2 seconds.     Comments: Good skin turgor  Neurological:     General: No focal deficit present.     Mental Status: He is alert and oriented to person, place, and time.  Psychiatric:        Mood and Affect: Mood normal.        Behavior: Behavior normal.     UC Treatments / Results  Labs (all labs ordered are listed, but only abnormal results are displayed) Labs Reviewed - No data to display  EKG   Radiology CT ABDOMEN PELVIS W CONTRAST  Result Date: 04/29/2021 CLINICAL DATA:  Abdominal distension. EXAM: CT ABDOMEN AND PELVIS WITH CONTRAST TECHNIQUE: Multidetector CT imaging of the abdomen and pelvis was performed using the standard protocol following bolus administration of intravenous contrast. CONTRAST:  8m OMNIPAQUE IOHEXOL 350  MG/ML SOLN COMPARISON:  None. FINDINGS: Lower chest: No acute abnormality. Hepatobiliary: There is mild diffuse fatty infiltration of the liver parenchyma. No focal liver abnormality is seen. No gallstones, gallbladder wall thickening, or biliary dilatation. Pancreas: Unremarkable. No pancreatic ductal dilatation or surrounding inflammatory changes. Spleen: Normal in size without focal abnormality. Adrenals/Urinary Tract: Adrenal glands are  unremarkable. Kidneys are normal, without renal calculi, focal lesion, or hydronephrosis. Bladder is unremarkable. Stomach/Bowel: Stomach is within normal limits. The appendix is mildly thickened and inflamed, with associated periappendiceal inflammatory fat stranding. There is no evidence of associated perforation or abscess. No evidence of bowel dilatation. Vascular/Lymphatic: Aortic atherosclerosis. No enlarged abdominal or pelvic lymph nodes. Reproductive: Prostate is unremarkable. Other: No abdominal wall hernia or abnormality. No abdominopelvic ascites. Musculoskeletal: Marked severity degenerative changes are seen at the level of L5-S1. IMPRESSION: Mild appendicitis without associated perforation or abscess. Electronically Signed   By: Virgina Norfolk M.D.   On: 04/29/2021 03:27    Procedures Procedures (including critical care time)  Medications Ordered in UC Medications - No data to display  Initial Impression / Assessment and Plan / UC Course  I have reviewed the triage vital signs and the nursing notes.  Pertinent labs & imaging results that were available during my care of the patient were reviewed by me and considered in my medical decision making (see chart for details).     This patient is a very pleasant 65 y.o. year old male presenting with significant RLQ and RUQ pain and constipation. Sent to ED for further workup via personal vehicle. No chest pain, shortness of breath.  **Level 5 as this patient was admitted for appendectomy. This is an emergent medical condition requiring surgery and cannot be managed outpatient.  Final Clinical Impressions(s) / UC Diagnoses   Final diagnoses:  Sudden onset of severe abdominal pain  Acute appendicitis, unspecified acute appendicitis type     Discharge Instructions      -Please head to [facility] for further evaluation and management of your abdominal pain.  Given the location of the pain, I am concerned that you  are having an appendix or gallbladder issue.  Alternatively, a hernia could be causing the pain and the constipation.  You require imaging and work-up that we cannot perform at urgent care.  Please head straight there, if symptoms get worse on the way stop and call 911 immediately.     ED Prescriptions   None    PDMP not reviewed this encounter.   Hazel Sams, PA-C 04/28/21 1422    Hazel Sams, PA-C 04/29/21 [phone]

## 2021-04-29 ENCOUNTER — Emergency Department (HOSPITAL_COMMUNITY): Payer: Medicaid Other

## 2021-04-29 ENCOUNTER — Observation Stay (HOSPITAL_COMMUNITY): Payer: Medicaid Other | Admitting: Certified Registered Nurse Anesthetist

## 2021-04-29 ENCOUNTER — Encounter (HOSPITAL_COMMUNITY): Payer: Self-pay

## 2021-04-29 ENCOUNTER — Encounter (HOSPITAL_COMMUNITY)
Admission: EM | Disposition: A | Discharge status: Home / Self Care | Payer: Self-pay | Source: Home / Self Care | Attending: Emergency Medicine

## 2021-04-29 DIAGNOSIS — F17210 Nicotine dependence, cigarettes, uncomplicated: Secondary | ICD-10-CM | POA: Diagnosis not present

## 2021-04-29 DIAGNOSIS — K3580 Unspecified acute appendicitis: Secondary | ICD-10-CM | POA: Diagnosis not present

## 2021-04-29 DIAGNOSIS — K37 Unspecified appendicitis: Secondary | ICD-10-CM

## 2021-04-29 DIAGNOSIS — Z20822 Contact with and (suspected) exposure to covid-19: Secondary | ICD-10-CM | POA: Diagnosis not present

## 2021-04-29 DIAGNOSIS — I2510 Atherosclerotic heart disease of native coronary artery without angina pectoris: Secondary | ICD-10-CM | POA: Diagnosis not present

## 2021-04-29 HISTORY — DX: Unspecified appendicitis: K37

## 2021-04-29 HISTORY — PX: LAPAROSCOPIC APPENDECTOMY: SHX408

## 2021-04-29 LAB — CBC WITH DIFFERENTIAL/PLATELET
Abs Immature Granulocytes: 0.03 10*3/uL (ref 0.00–0.07)
Basophils Absolute: 0 10*3/uL (ref 0.0–0.1)
Basophils Relative: 1 %
Eosinophils Absolute: 0.1 10*3/uL (ref 0.0–0.5)
Eosinophils Relative: 1 %
HCT: 47.3 % (ref 39.0–52.0)
Hemoglobin: 16.5 g/dL (ref 13.0–17.0)
Immature Granulocytes: 0 %
Lymphocytes Relative: 28 %
Lymphs Abs: 2 10*3/uL (ref 0.7–4.0)
MCH: 31.7 pg (ref 26.0–34.0)
MCHC: 34.9 g/dL (ref 30.0–36.0)
MCV: 90.8 fL (ref 80.0–100.0)
Monocytes Absolute: 0.6 10*3/uL (ref 0.1–1.0)
Monocytes Relative: 8 %
Neutro Abs: 4.2 10*3/uL (ref 1.7–7.7)
Neutrophils Relative %: 62 %
Platelets: 190 10*3/uL (ref 150–400)
RBC: 5.21 MIL/uL (ref 4.22–5.81)
RDW: 14.6 % (ref 11.5–15.5)
WBC: 6.9 10*3/uL (ref 4.0–10.5)
nRBC: 0 % (ref 0.0–0.2)

## 2021-04-29 LAB — LIPASE, BLOOD: Lipase: 27 U/L (ref 11–51)

## 2021-04-29 LAB — COMPREHENSIVE METABOLIC PANEL
ALT: 20 U/L (ref 0–44)
AST: 25 U/L (ref 15–41)
Albumin: 3.5 g/dL (ref 3.5–5.0)
Alkaline Phosphatase: 49 U/L (ref 38–126)
Anion gap: 8 (ref 5–15)
BUN: 11 mg/dL (ref 8–23)
CO2: 26 mmol/L (ref 22–32)
Calcium: 9.1 mg/dL (ref 8.9–10.3)
Chloride: 103 mmol/L (ref 98–111)
Creatinine, Ser: 1.01 mg/dL (ref 0.61–1.24)
GFR, Estimated: >60 mL/min (ref >60)
Glucose, Bld: 109 mg/dL — ABNORMAL HIGH (ref 70–99)
Potassium: 3.8 mmol/L (ref 3.5–5.1)
Sodium: 137 mmol/L (ref 135–145)
Total Bilirubin: 0.8 mg/dL (ref 0.3–1.2)
Total Protein: 6.6 g/dL (ref 6.5–8.1)

## 2021-04-29 LAB — RAPID URINE DRUG SCREEN, HOSP PERFORMED
Amphetamines: NOT DETECTED
Barbiturates: NOT DETECTED
Benzodiazepines: NOT DETECTED
Cocaine: NOT DETECTED
Opiates: NOT DETECTED
Tetrahydrocannabinol: NOT DETECTED

## 2021-04-29 LAB — RESP PANEL BY RT-PCR (FLU A&B, COVID) ARPGX2
Influenza A by PCR: NEGATIVE
Influenza B by PCR: NEGATIVE
SARS Coronavirus 2 by RT PCR: NEGATIVE

## 2021-04-29 SURGERY — APPENDECTOMY, LAPAROSCOPIC
Anesthesia: General | Site: Abdomen

## 2021-04-29 MED ORDER — LACTATED RINGERS IV SOLN: INTRAVENOUS | Status: DC | PRN

## 2021-04-29 MED ORDER — SUGAMMADEX SODIUM 200 MG/2ML IV SOLN
INTRAVENOUS | Status: DC | PRN
  Administered 2021-04-29: 200 mg via INTRAVENOUS

## 2021-04-29 MED ORDER — PROPOFOL 10 MG/ML IV BOLUS
INTRAVENOUS | Status: DC | PRN
  Administered 2021-04-29: 150 mg via INTRAVENOUS

## 2021-04-29 MED ORDER — FENTANYL CITRATE (PF) 100 MCG/2ML IJ SOLN: 25.0000 ug | INTRAMUSCULAR | Status: DC | PRN

## 2021-04-29 MED ORDER — SUCCINYLCHOLINE CHLORIDE 200 MG/10ML IV SOSY
PREFILLED_SYRINGE | INTRAVENOUS | Status: DC | PRN
  Administered 2021-04-29: 160 mg via INTRAVENOUS

## 2021-04-29 MED ORDER — DEXAMETHASONE SODIUM PHOSPHATE 10 MG/ML IJ SOLN
INTRAMUSCULAR | Status: DC | PRN
  Administered 2021-04-29: 10 mg via INTRAVENOUS

## 2021-04-29 MED ORDER — ROCURONIUM BROMIDE 10 MG/ML (PF) SYRINGE
PREFILLED_SYRINGE | INTRAVENOUS | Status: DC | PRN
  Administered 2021-04-29: 40 mg via INTRAVENOUS

## 2021-04-29 MED ORDER — SODIUM CHLORIDE 0.9 % IR SOLN
Status: DC | PRN
  Administered 2021-04-29: 1000 mL

## 2021-04-29 MED ORDER — FENTANYL CITRATE (PF) 100 MCG/2ML IJ SOLN
25.0000 ug | INTRAMUSCULAR | Status: DC | PRN
  Administered 2021-04-29 (×2): 50 ug via INTRAVENOUS

## 2021-04-29 MED ORDER — FENTANYL CITRATE (PF) 250 MCG/5ML IJ SOLN
INTRAMUSCULAR | Status: DC | PRN
  Administered 2021-04-29 (×3): 50 ug via INTRAVENOUS

## 2021-04-29 MED ORDER — ROCURONIUM BROMIDE 10 MG/ML (PF) SYRINGE
PREFILLED_SYRINGE | INTRAVENOUS | Status: AC
  Filled 2021-04-29: qty 10

## 2021-04-29 MED ORDER — MIDAZOLAM HCL 5 MG/5ML IJ SOLN
INTRAMUSCULAR | Status: DC | PRN
  Administered 2021-04-29 (×2): 1 mg via INTRAVENOUS

## 2021-04-29 MED ORDER — MIDAZOLAM HCL 2 MG/2ML IJ SOLN
INTRAMUSCULAR | Status: AC
  Filled 2021-04-29: qty 2

## 2021-04-29 MED ORDER — ONDANSETRON HCL 4 MG/2ML IJ SOLN
INTRAMUSCULAR | Status: AC
  Filled 2021-04-29: qty 2

## 2021-04-29 MED ORDER — PROPOFOL 10 MG/ML IV BOLUS
INTRAVENOUS | Status: AC
  Filled 2021-04-29: qty 20

## 2021-04-29 MED ORDER — LACTATED RINGERS IV SOLN: INTRAVENOUS | Status: DC

## 2021-04-29 MED ORDER — LIDOCAINE 2% (20 MG/ML) 5 ML SYRINGE
INTRAMUSCULAR | Status: DC | PRN
  Administered 2021-04-29: 60 mg via INTRAVENOUS

## 2021-04-29 MED ORDER — LIDOCAINE 2% (20 MG/ML) 5 ML SYRINGE
INTRAMUSCULAR | Status: AC
  Filled 2021-04-29: qty 5

## 2021-04-29 MED ORDER — SODIUM CHLORIDE 0.9 % IV SOLN
2.0000 g | INTRAVENOUS | Status: DC
  Administered 2021-04-29: 2 g via INTRAVENOUS
  Filled 2021-04-29: qty 20

## 2021-04-29 MED ORDER — SUCCINYLCHOLINE CHLORIDE 200 MG/10ML IV SOSY
PREFILLED_SYRINGE | INTRAVENOUS | Status: AC
  Filled 2021-04-29: qty 10

## 2021-04-29 MED ORDER — FENTANYL CITRATE (PF) 100 MCG/2ML IJ SOLN
INTRAMUSCULAR | Status: AC
  Filled 2021-04-29: qty 2

## 2021-04-29 MED ORDER — ACETAMINOPHEN 325 MG PO TABS: 650.0000 mg | ORAL_TABLET | Freq: Four times a day (QID) | ORAL | Status: DC | PRN

## 2021-04-29 MED ORDER — ORAL CARE MOUTH RINSE: 15.0000 mL | Freq: Once | OROMUCOSAL | Status: AC

## 2021-04-29 MED ORDER — DEXAMETHASONE SODIUM PHOSPHATE 10 MG/ML IJ SOLN
INTRAMUSCULAR | Status: AC
  Filled 2021-04-29: qty 1

## 2021-04-29 MED ORDER — FENTANYL CITRATE (PF) 250 MCG/5ML IJ SOLN
INTRAMUSCULAR | Status: AC
  Filled 2021-04-29: qty 5

## 2021-04-29 MED ORDER — CHLORHEXIDINE GLUCONATE 0.12 % MT SOLN
OROMUCOSAL | Status: AC
  Administered 2021-04-29: 15 mL via OROMUCOSAL
  Filled 2021-04-29: qty 15

## 2021-04-29 MED ORDER — AMISULPRIDE (ANTIEMETIC) 5 MG/2ML IV SOLN
10.0000 mg | Freq: Once | INTRAVENOUS | Status: AC | PRN
  Administered 2021-04-29: 10 mg via INTRAVENOUS

## 2021-04-29 MED ORDER — 0.9 % SODIUM CHLORIDE (POUR BTL) OPTIME
TOPICAL | Status: DC | PRN
  Administered 2021-04-29: 1000 mL

## 2021-04-29 MED ORDER — BUPIVACAINE HCL 0.25 % IJ SOLN
INTRAMUSCULAR | Status: DC | PRN
  Administered 2021-04-29: 6 mL

## 2021-04-29 MED ORDER — ONDANSETRON HCL 4 MG/2ML IJ SOLN
INTRAMUSCULAR | Status: DC | PRN
  Administered 2021-04-29: 4 mg via INTRAVENOUS

## 2021-04-29 MED ORDER — SODIUM CHLORIDE 0.9 % IV SOLN: INTRAVENOUS | Status: DC

## 2021-04-29 MED ORDER — ONDANSETRON 4 MG PO TBDP: 4.0000 mg | ORAL_TABLET | Freq: Four times a day (QID) | ORAL | Status: DC | PRN

## 2021-04-29 MED ORDER — BUPIVACAINE HCL (PF) 0.25 % IJ SOLN
INTRAMUSCULAR | Status: AC
  Filled 2021-04-29: qty 30

## 2021-04-29 MED ORDER — PROMETHAZINE HCL 25 MG/ML IJ SOLN: 6.2500 mg | INTRAMUSCULAR | Status: DC | PRN

## 2021-04-29 MED ORDER — MENTHOL 3 MG MT LOZG
LOZENGE | OROMUCOSAL | Status: AC
  Filled 2021-04-29: qty 9

## 2021-04-29 MED ORDER — ONDANSETRON HCL 4 MG/2ML IJ SOLN: 4.0000 mg | Freq: Four times a day (QID) | INTRAMUSCULAR | Status: DC | PRN

## 2021-04-29 MED ORDER — MORPHINE SULFATE (PF) 2 MG/ML IV SOLN
2.0000 mg | INTRAVENOUS | Status: DC | PRN
Start: 2021-04-29 — End: 2021-04-29

## 2021-04-29 MED ORDER — METRONIDAZOLE 500 MG/100ML IV SOLN
500.0000 mg | Freq: Three times a day (TID) | INTRAVENOUS | Status: DC
  Administered 2021-04-29: 500 mg via INTRAVENOUS
  Filled 2021-04-29: qty 100

## 2021-04-29 MED ORDER — TRAMADOL HCL 50 MG PO TABS: 50.0000 mg | ORAL_TABLET | Freq: Four times a day (QID) | ORAL | 0 refills | Status: DC | PRN

## 2021-04-29 MED ORDER — AMISULPRIDE (ANTIEMETIC) 5 MG/2ML IV SOLN
INTRAVENOUS | Status: AC
  Filled 2021-04-29: qty 4

## 2021-04-29 MED ORDER — CHLORHEXIDINE GLUCONATE 0.12 % MT SOLN: 15.0000 mL | Freq: Once | OROMUCOSAL | Status: AC

## 2021-04-29 MED ORDER — ACETAMINOPHEN 650 MG RE SUPP: 650.0000 mg | Freq: Four times a day (QID) | RECTAL | Status: DC | PRN

## 2021-04-29 MED ORDER — IOHEXOL 350 MG/ML SOLN
80.0000 mL | Freq: Once | INTRAVENOUS | Status: AC | PRN
  Administered 2021-04-29: 80 mL via INTRAVENOUS

## 2021-04-29 SURGICAL SUPPLY — 41 items
ADH SKN CLS APL DERMABOND .7 (GAUZE/BANDAGES/DRESSINGS) ×1
APL PRP STRL LF DISP 70% ISPRP (MISCELLANEOUS) ×1
APPLIER CLIP 5 13 M/L LIGAMAX5 (MISCELLANEOUS)
APR CLP MED LRG 5 ANG JAW (MISCELLANEOUS)
BAG COUNTER SPONGE SURGICOUNT (BAG) ×2 IMPLANT
BAG SPNG CNTER NS LX DISP (BAG) ×1
CANISTER SUCT 3000ML PPV (MISCELLANEOUS) ×2 IMPLANT
CHLORAPREP W/TINT 26 (MISCELLANEOUS) ×2 IMPLANT
CLIP APPLIE 5 13 M/L LIGAMAX5 (MISCELLANEOUS) IMPLANT
CLIP VESOLOCK XL 6/CT (CLIP) ×2 IMPLANT
COVER SURGICAL LIGHT HANDLE (MISCELLANEOUS) ×2 IMPLANT
COVER TRANSDUCER ULTRASND (DRAPES) ×2 IMPLANT
DERMABOND ADVANCED (GAUZE/BANDAGES/DRESSINGS) ×1
DERMABOND ADVANCED .7 DNX12 (GAUZE/BANDAGES/DRESSINGS) ×1 IMPLANT
ELECT REM PT RETURN 9FT ADLT (ELECTROSURGICAL) ×2
ELECTRODE REM PT RTRN 9FT ADLT (ELECTROSURGICAL) ×1 IMPLANT
GLOVE SURG ENC MOIS LTX SZ7.5 (GLOVE) ×2 IMPLANT
GOWN STRL REUS W/ TWL LRG LVL3 (GOWN DISPOSABLE) ×2 IMPLANT
GOWN STRL REUS W/ TWL XL LVL3 (GOWN DISPOSABLE) ×1 IMPLANT
GOWN STRL REUS W/TWL LRG LVL3 (GOWN DISPOSABLE) ×4
GOWN STRL REUS W/TWL XL LVL3 (GOWN DISPOSABLE) ×2
GRASPER SUT TROCAR 14GX15 (MISCELLANEOUS) ×2 IMPLANT
KIT BASIN OR (CUSTOM PROCEDURE TRAY) ×2 IMPLANT
KIT TURNOVER KIT B (KITS) ×2 IMPLANT
NDL INSUFFLATION 14GA 120MM (NEEDLE) ×1 IMPLANT
NEEDLE INSUFFLATION 14GA 120MM (NEEDLE) ×2 IMPLANT
NS IRRIG 1000ML POUR BTL (IV SOLUTION) ×2 IMPLANT
PAD ARMBOARD 7.5X6 YLW CONV (MISCELLANEOUS) ×4 IMPLANT
SCISSORS LAP 5X35 DISP (ENDOMECHANICALS) ×2 IMPLANT
SET IRRIG TUBING LAPAROSCOPIC (IRRIGATION / IRRIGATOR) ×2 IMPLANT
SET TUBE SMOKE EVAC HIGH FLOW (TUBING) ×2 IMPLANT
SLEEVE ENDOPATH XCEL 5M (ENDOMECHANICALS) ×2 IMPLANT
SPECIMEN JAR SMALL (MISCELLANEOUS) ×2 IMPLANT
SUT MNCRL AB 4-0 PS2 18 (SUTURE) ×2 IMPLANT
TOWEL GREEN STERILE (TOWEL DISPOSABLE) ×2 IMPLANT
TOWEL GREEN STERILE FF (TOWEL DISPOSABLE) ×2 IMPLANT
TRAY LAPAROSCOPIC MC (CUSTOM PROCEDURE TRAY) ×2 IMPLANT
TROCAR XCEL NON-BLD 11X100MML (ENDOMECHANICALS) ×2 IMPLANT
TROCAR XCEL NON-BLD 5MMX100MML (ENDOMECHANICALS) ×2 IMPLANT
WARMER LAPAROSCOPE (MISCELLANEOUS) ×2 IMPLANT
WATER STERILE IRR 1000ML POUR (IV SOLUTION) ×2 IMPLANT

## 2021-04-29 NOTE — Discharge Instructions (Signed)
[facility] ° °Please arrive at least 30 min before your appointment to complete your check in paperwork.  If you are unable to arrive 30 min prior to your appointment time we may have to cancel or reschedule you. °LAPAROSCOPIC SURGERY: POST OP INSTRUCTIONS °Always review your discharge instruction sheet given to you by the facility where your surgery was performed. °IF YOU HAVE DISABILITY OR FAMILY LEAVE FORMS, YOU MUST BRING THEM TO THE OFFICE FOR PROCESSING.   °DO NOT GIVE THEM TO YOUR DOCTOR. ° °PAIN CONTROL ° °First take acetaminophen (Tylenol) AND/or ibuprofen (Advil) to control your pain after surgery.  Follow directions on package.  Taking acetaminophen (Tylenol) and/or ibuprofen (Advil) regularly after surgery will help to control your pain and lower the amount of prescription pain medication you may need.  You should not take more than 4,000 mg (4 grams) of acetaminophen (Tylenol) in 24 hours.  You should not take ibuprofen (Advil), aleve, motrin, naprosyn or other NSAIDS if you have a history of stomach ulcers or chronic kidney disease.  °A prescription for pain medication may be given to you upon discharge.  Take your pain medication as prescribed, if you still have uncontrolled pain after taking acetaminophen (Tylenol) or ibuprofen (Advil). °Use ice packs to help control pain. °If you need a refill on your pain medication, please contact your pharmacy.  They will contact our office to request authorization. Prescriptions will not be filled after 5pm or on week-ends. ° °HOME MEDICATIONS °Take your usually prescribed medications unless otherwise directed. ° °DIET °You should follow a light diet the first few days after arrival home.  Be sure to include lots of fluids daily. Avoid fatty, fried foods.  ° °CONSTIPATION °It is common to experience some constipation after surgery and if you are taking pain medication.  Increasing fluid intake and taking a stool softener (such as Colace)  will usually help or prevent this problem from occurring.  A mild laxative (Milk of Magnesia or Miralax) should be taken according to package instructions if there are no bowel movements after 48 hours. ° °WOUND/INCISION CARE °Most patients will experience some swelling and bruising in the area of the incisions.  Ice packs will help.  Swelling and bruising can take several days to resolve.  °Unless discharge instructions indicate otherwise, follow guidelines below  °STERI-STRIPS - you may remove your outer bandages 48 hours after surgery, and you may shower at that time.  You have steri-strips (small skin tapes) in place directly over the incision.  These strips should be left on the skin for 7-10 days.   °DERMABOND/SKIN GLUE - you may shower in 24 hours.  The glue will flake off over the next 2-3 weeks. °Any sutures or staples will be removed at the office during your follow-up visit. ° °ACTIVITIES °You may resume regular (light) daily activities beginning the next day--such as daily self-care, walking, climbing stairs--gradually increasing activities as tolerated.  You may have sexual intercourse when it is comfortable.  Refrain from any heavy lifting or straining until approved by your doctor. °You may drive when you are no longer taking prescription pain medication, you can comfortably wear a seatbelt, and you can safely maneuver your car and apply brakes. ° °FOLLOW-UP °You should see your doctor in the office for a follow-up appointment approximately 2-3 weeks after your surgery.  You should have been given your post-op/follow-up appointment when your surgery was scheduled.  If you did not receive a post-op/follow-up appointment, make sure   that you call for this appointment within a day or two after you arrive home to insure a convenient appointment time. ° ° °WHEN TO CALL YOUR DOCTOR: °Fever over 101.0 °Inability to urinate °Continued bleeding from incision. °Increased pain, redness, or drainage from the  incision. °Increasing abdominal pain ° °The clinic staff is available to answer your questions during regular business hours.  Please don’t hesitate to call and ask to speak to one of the nurses for clinical concerns.  If you have a medical emergency, go to the nearest emergency room or call 911.  A surgeon from [facility] is always on call at the hospital. °[address], [address], [place], [place]  [address] ? [address] [address], [place], [place]   [address] °[phone] ? [phone] ? FAX [phone] ° ° ° ° °Managing Your Pain After Surgery Without Opioids ° ° ° °Thank you for participating in our program to help patients manage their pain after surgery without opioids. This is part of our effort to provide you with the best care possible, without exposing you or your family to the risk that opioids pose. ° °What pain can I expect after surgery? °You can expect to have some pain after surgery. This is normal. The pain is typically worse the day after surgery, and quickly begins to get better. °Many studies have found that many patients are able to manage their pain after surgery with Over-the-Counter (OTC) medications such as Tylenol and Motrin. If you have a condition that does not allow you to take Tylenol or Motrin, notify your surgical team. ° °How will I manage my pain? °The best strategy for controlling your pain after surgery is around the clock pain control with Tylenol (acetaminophen) and Motrin (ibuprofen or Advil). Alternating these medications with each other allows you to maximize your pain control. In addition to Tylenol and Motrin, you can use heating pads or ice packs on your incisions to help reduce your pain. ° °How will I alternate your regular strength over-the-counter pain medication? °You will take a dose of pain medication every three hours. °Start by taking 650 mg of Tylenol (2 pills of 325 mg) °3 hours later take 600 mg of Motrin (3 pills of 200 mg) °3 hours after  taking the Motrin take 650 mg of Tylenol °3 hours after that take 600 mg of Motrin. ° ° °- 1 - ° °See example - if your first dose of Tylenol is at 12:00 PM ° ° °12:00 PM Tylenol 650 mg (2 pills of 325 mg)  °3:00 PM Motrin 600 mg (3 pills of 200 mg)  °6:00 PM Tylenol 650 mg (2 pills of 325 mg)  °9:00 PM Motrin 600 mg (3 pills of 200 mg)  °Continue alternating every 3 hours  ° °We recommend that you follow this schedule around-the-clock for at least 3 days after surgery, or until you feel that it is no longer needed. Use the table on the last page of this handout to keep track of the medications you are taking. °Important: °Do not take more than 3000mg of Tylenol or 3200mg of Motrin in a 24-hour period. °Do not take ibuprofen/Motrin if you have a history of bleeding stomach ulcers, severe kidney disease, &/or actively taking a blood thinner ° °What if I still have pain? °If you have pain that is not controlled with the over-the-counter pain medications (Tylenol and Motrin or Advil) you might have what we call “breakthrough” pain. You will receive a prescription   for a small amount of an opioid pain medication such as Oxycodone, Tramadol, or Tylenol with Codeine. Use these opioid pills in the first 24 hours after surgery if you have breakthrough pain. Do not take more than 1 pill every 4-6 hours. ° °If you still have uncontrolled pain after using all opioid pills, don't hesitate to call our staff using the number provided. We will help make sure you are managing your pain in the best way possible, and if necessary, we can provide a prescription for additional pain medication. ° ° °Day 1   ° °Time  °Name of Medication Number of pills taken  °Amount of Acetaminophen  °Pain Level  ° °Comments  °AM PM       °AM PM       °AM PM       °AM PM       °AM PM       °AM PM       °AM PM       °AM PM       °Total Daily amount of Acetaminophen °Do not take more than  3,000 mg per day    ° ° °Day 2   ° °Time  °Name of Medication  Number of pills °taken  °Amount of Acetaminophen  °Pain Level  ° °Comments  °AM PM       °AM PM       °AM PM       °AM PM       °AM PM       °AM PM       °AM PM       °AM PM       °Total Daily amount of Acetaminophen °Do not take more than  3,000 mg per day    ° ° °Day 3   ° °Time  °Name of Medication Number of pills taken  °Amount of Acetaminophen  °Pain Level  ° °Comments  °AM PM       °AM PM       °AM PM       °AM PM       ° ° ° °AM PM       °AM PM       °AM PM       °AM PM       °Total Daily amount of Acetaminophen °Do not take more than  3,000 mg per day    ° ° °Day 4   ° °Time  °Name of Medication Number of pills taken  °Amount of Acetaminophen  °Pain Level  ° °Comments  °AM PM       °AM PM       °AM PM       °AM PM       °AM PM       °AM PM       °AM PM       °AM PM       °Total Daily amount of Acetaminophen °Do not take more than  3,000 mg per day    ° ° °Day 5   ° °Time  °Name of Medication Number °of pills taken  °Amount of Acetaminophen  °Pain Level  ° °Comments  °AM PM       °AM PM       °AM PM       °AM PM       °AM PM       °AM   PM       °AM PM       °AM PM       °Total Daily amount of Acetaminophen °Do not take more than  3,000 mg per day    ° ° ° °Day 6   ° °Time  °Name of Medication Number of pills °taken  °Amount of Acetaminophen  °Pain Level  °Comments  °AM PM       °AM PM       °AM PM       °AM PM       °AM PM       °AM PM       °AM PM       °AM PM       °Total Daily amount of Acetaminophen °Do not take more than  3,000 mg per day    ° ° °Day 7   ° °Time  °Name of Medication Number of pills taken  °Amount of Acetaminophen  °Pain Level  ° °Comments  °AM PM       °AM PM       °AM PM       °AM PM       °AM PM       °AM PM       °AM PM       °AM PM       °Total Daily amount of Acetaminophen °Do not take more than  3,000 mg per day    ° ° ° ° °For additional information about how and where to safely dispose of unused opioid °medications - https://www.morepowerfulnc.org ° °Disclaimer: This document  contains information and/or instructional materials adapted from [facility] for the typical patient with your condition. It does not replace medical advice from your health care provider because your experience may differ from that of the °typical patient. Talk to your health care provider if you have any questions about this °document, your condition or your treatment plan. °Adapted from [facility] ° °

## 2021-04-29 NOTE — H&P (Signed)
Lucas Stark is an 65 y.o. male.   Chief Complaint: ab pain HPI: 7 yom with 24 hours of rlq pain presented acutely.  No n/v.  Has prior csc per his report. Had normal bm yesterday. No fevers. Nothing was making it better at home.  He presented to urgent care and referred to er. Has ct scan that shows early appendicitis  Past Medical History:  Diagnosis Date   CAD (coronary artery disease) cardiologist-- dr Stanford Breed   a. 03/2016 NSTEMI/PCI: LM nl, LAD nl, RI nl, LCX nl, OM2 99 ( 2.75 x 16 Synergy DES), RCA nl, EF 45-50%.   ETOH abuse    GERD (gastroesophageal reflux disease)    History of cocaine abuse ([place])    per pt none since 2017   History of non-ST elevation myocardial infarction (NSTEMI) 03/14/2016   s/p  PCI and DES x1   Hyperlipidemia    Hypertension    Hypertensive heart disease    Ischemic cardiomyopathy    a. 03/2016 Echo: EF 35-40%, diff H, sev inflat HK, Gr2 DD;  b. 03/2016 EF 45-50% by LV gram.;  echo 10-24-2018, ef 45-50%   Myocardial infarction ([place]) 2017   NSTEMI    OA (osteoarthritis)    left knee   S/P drug eluting coronary stent placement 03/14/2016   DES x1 to OM1   Tobacco abuse    Wears dentures    upper   Wears partial dentures    lower    Past Surgical History:  Procedure Laterality Date   CARDIAC CATHETERIZATION N/A 03/14/2016   Procedure: Left Heart Cath and Coronary Angiography;  Surgeon: Burnell Blanks, MD;  Location: [facility];  Service: Cardiovascular;  Laterality: N/A;   CORONARY ANGIOPLASTY WITH STENT PLACEMENT     FACIAL COSMETIC SURGERY     at age 56 yrs old   [name]  age 51   face around eye   PARTIAL KNEE ARTHROPLASTY Left 11/13/2018   Procedure: UNICOMPARTMENTAL KNEE;  Surgeon: Renette Butters, MD;  Location: [facility];  Service: Orthopedics;  Laterality: Left;   WRIST SURGERY Left 01/31/2020    Family History  Problem Relation Age of Onset   Heart attack Father    Heart attack Sister    Colon polyps Maternal  Uncle    Colon cancer Neg Hx    Esophageal cancer Neg Hx    Stomach cancer Neg Hx    Rectal cancer Neg Hx    Social History:  reports that he has been smoking cigarettes. He has a 35.00 pack-year smoking history. He has never used smokeless tobacco. He reports current alcohol use. He reports that he does not currently use drugs.  Allergies:  Allergies  Allergen Reactions   Advil [Ibuprofen]     " I cant take, I had a stent put in, I have to take tylenol"   Other Itching    Prescription Sleeping pills, Unsure of the name of the sleeping pill   Oxycodone     Nervousness, sweaty, itchy    Current Outpatient Medications  Medication Instructions   acetaminophen (TYLENOL) 1,000 mg, Oral, Every 6 hours PRN   aspirin EC 81 mg, Oral, Daily, Swallow whole.   Bempedoic Acid (NEXLETOL) 180 MG TABS 1 tablet, Oral, Daily   carvedilol (COREG) 6.25 MG tablet TAKE 1 TABLET BY MOUTH 2 TIMES DAILY   chlorthalidone (HYGROTON) 25 MG tablet TAKE 1/2 TABLET BY MOUTH EVERY DAY   D3 Super Strength 2,000 Units, Oral,  Daily   ezetimibe (ZETIA) 10 mg, Oral, Daily   losartan (COZAAR) 100 mg, Oral, Daily   nitroGLYCERIN (NITROSTAT) 0.4 mg, Sublingual, Every 5 min PRN   pantoprazole (PROTONIX) 20 MG tablet TAKE 1 TABLET BY MOUTH EVERY DAY   rosuvastatin (CRESTOR) 40 mg, Oral, Daily   sildenafil (REVATIO) 20 mg, Oral, Daily PRN   sucralfate (CARAFATE) 1 g, Oral, 3 times daily with meals & bedtime      Results for orders placed or performed during the hospital encounter of 04/28/21 (from the past 48 hour(s))  Urinalysis, Routine w reflex microscopic     Status: None   Collection Time: 04/28/21  4:10 PM  Result Value Ref Range   Color, Urine YELLOW YELLOW   APPearance CLEAR CLEAR   Specific Gravity, Urine 1.014 1.005 - 1.030   pH 6.0 5.0 - 8.0   Glucose, UA NEGATIVE NEGATIVE mg/dL   Hgb urine dipstick NEGATIVE NEGATIVE   Bilirubin Urine NEGATIVE NEGATIVE   Ketones, ur NEGATIVE NEGATIVE mg/dL    Protein, ur NEGATIVE NEGATIVE mg/dL   Nitrite NEGATIVE NEGATIVE   Leukocytes,Ua NEGATIVE NEGATIVE    Comment: Performed at [facility], [address] [place], [place] [address]  Comprehensive metabolic panel     Status: Abnormal   Collection Time: 04/29/21 12:17 AM  Result Value Ref Range   Sodium 137 135 - 145 mmol/L   Potassium 3.8 3.5 - 5.1 mmol/L   Chloride 103 98 - 111 mmol/L   CO2 26 22 - 32 mmol/L   Glucose, Bld 109 (H) 70 - 99 mg/dL    Comment: Glucose reference range applies only to samples taken after fasting for at least 8 hours.   BUN 11 8 - 23 mg/dL   Creatinine, Ser 1.01 0.61 - 1.24 mg/dL   Calcium 9.1 8.9 - 10.3 mg/dL   Total Protein 6.6 6.5 - 8.1 g/dL   Albumin 3.5 3.5 - 5.0 g/dL   AST 25 15 - 41 U/L   ALT 20 0 - 44 U/L   Alkaline Phosphatase 49 38 - 126 U/L   Total Bilirubin 0.8 0.3 - 1.2 mg/dL   GFR, Estimated >60 >60 mL/min    Comment: (NOTE) Calculated using the CKD-EPI Creatinine Equation (2021)    Anion gap 8 5 - 15    Comment: Performed at [facility] [address] [place], [place] [address]  CBC with Differential     Status: None   Collection Time: 04/29/21 12:17 AM  Result Value Ref Range   WBC 6.9 4.0 - 10.5 K/uL   RBC 5.21 4.22 - 5.81 MIL/uL   Hemoglobin 16.5 13.0 - 17.0 g/dL   HCT 47.3 39.0 - 52.0 %   MCV 90.8 80.0 - 100.0 fL   MCH 31.7 26.0 - 34.0 pg   MCHC 34.9 30.0 - 36.0 g/dL   RDW 14.6 11.5 - 15.5 %   Platelets 190 150 - 400 K/uL   nRBC 0.0 0.0 - 0.2 %   Neutrophils Relative % 62 %   Neutro Abs 4.2 1.7 - 7.7 K/uL   Lymphocytes Relative 28 %   Lymphs Abs 2.0 0.7 - 4.0 K/uL   Monocytes Relative 8 %   Monocytes Absolute 0.6 0.1 - 1.0 K/uL   Eosinophils Relative 1 %   Eosinophils Absolute 0.1 0.0 - 0.5 K/uL   Basophils Relative 1 %   Basophils Absolute 0.0 0.0 - 0.1 K/uL   Immature Granulocytes 0 %   Abs  Immature Granulocytes 0.03 0.00 - 0.07 K/uL    Comment: Performed at [facility], [place] [address],  [place], [place] [address]  Lipase, blood     Status: None   Collection Time: 04/29/21 12:17 AM  Result Value Ref Range   Lipase 27 11 - 51 U/L    Comment: Performed at [facility] [address], [place], [place] [address]  Resp Panel by RT-PCR (Flu A&B, Covid) Urine, Clean Catch     Status: None   Collection Time: 04/29/21  4:34 AM   Specimen: Urine, Clean Catch; Nasopharyngeal(NP) swabs in vial transport medium  Result Value Ref Range   SARS Coronavirus 2 by RT PCR NEGATIVE NEGATIVE    Comment: (NOTE) SARS-CoV-2 target nucleic acids are NOT DETECTED.  The SARS-CoV-2 RNA is generally detectable in upper respiratory specimens during the acute phase of infection. The lowest concentration of SARS-CoV-2 viral copies this assay can detect is 138 copies/mL. A negative result does not preclude SARS-Cov-2 infection and should not be used as the sole basis for treatment or other patient management decisions. A negative result may occur with  improper specimen collection/handling, submission of specimen other than nasopharyngeal swab, presence of viral mutation(s) within the areas targeted by this assay, and inadequate number of viral copies(<138 copies/mL). A negative result must be combined with clinical observations, patient history, and epidemiological information. The expected result is Negative.  Fact Sheet for Patients:  EntrepreneurPulse.com.au  Fact Sheet for Healthcare Providers:  IncredibleEmployment.be  This test is no t yet approved or cleared by the [place] FDA and  has been authorized for detection and/or diagnosis of SARS-CoV-2 by FDA under an Emergency Use Authorization (EUA). This EUA will remain  in effect (meaning this test can be used) for the duration of the COVID-19 declaration under Section 564(b)(1) of the Act, 21 U.S.C.section 360bbb-3(b)(1), unless the authorization is terminated  or revoked sooner.        Influenza A by PCR NEGATIVE NEGATIVE   Influenza B by PCR NEGATIVE NEGATIVE    Comment: (NOTE) The Xpert Xpress SARS-CoV-2/FLU/RSV plus assay is intended as an aid in the diagnosis of influenza from Nasopharyngeal swab specimens and should not be used as a sole basis for treatment. Nasal washings and aspirates are unacceptable for Xpert Xpress SARS-CoV-2/FLU/RSV testing.  Fact Sheet for Patients: EntrepreneurPulse.com.au  Fact Sheet for Healthcare Providers: IncredibleEmployment.be  This test is not yet approved or cleared by the [place] FDA and has been authorized for detection and/or diagnosis of SARS-CoV-2 by FDA under an Emergency Use Authorization (EUA). This EUA will remain in effect (meaning this test can be used) for the duration of the COVID-19 declaration under Section 564(b)(1) of the Act, 21 U.S.C. section 360bbb-3(b)(1), unless the authorization is terminated or revoked.  Performed at [facility], [place] [address], [place], [place] [address]   Urine rapid drug screen (hosp performed)     Status: None   Collection Time: 04/29/21  4:36 AM  Result Value Ref Range   Opiates NONE DETECTED NONE DETECTED   Cocaine NONE DETECTED NONE DETECTED   Benzodiazepines NONE DETECTED NONE DETECTED   Amphetamines NONE DETECTED NONE DETECTED   Tetrahydrocannabinol NONE DETECTED NONE DETECTED   Barbiturates NONE DETECTED NONE DETECTED    Comment: (NOTE) DRUG SCREEN FOR MEDICAL PURPOSES ONLY.  IF CONFIRMATION IS NEEDED FOR ANY PURPOSE, NOTIFY LAB WITHIN 5 DAYS.  LOWEST DETECTABLE LIMITS FOR URINE DRUG SCREEN Drug Class  Cutoff (ng/mL) Amphetamine and metabolites    1000 Barbiturate and metabolites    200 Benzodiazepine                 [id number] Tricyclics and metabolites     300 Opiates and metabolites        300 Cocaine and metabolites        300 THC                            50 Performed at [facility], [place] [address] [place], [place] [address]    CT ABDOMEN PELVIS W CONTRAST  Result Date: 04/29/2021 CLINICAL DATA:  Abdominal distension. EXAM: CT ABDOMEN AND PELVIS WITH CONTRAST TECHNIQUE: Multidetector CT imaging of the abdomen and pelvis was performed using the standard protocol following bolus administration of intravenous contrast. CONTRAST:  93m OMNIPAQUE IOHEXOL 350 MG/ML SOLN COMPARISON:  None. FINDINGS: Lower chest: No acute abnormality. Hepatobiliary: There is mild diffuse fatty infiltration of the liver parenchyma. No focal liver abnormality is seen. No gallstones, gallbladder wall thickening, or biliary dilatation. Pancreas: Unremarkable. No pancreatic ductal dilatation or surrounding inflammatory changes. Spleen: Normal in size without focal abnormality. Adrenals/Urinary Tract: Adrenal glands are unremarkable. Kidneys are normal, without renal calculi, focal lesion, or hydronephrosis. Bladder is unremarkable. Stomach/Bowel: Stomach is within normal limits. The appendix is mildly thickened and inflamed, with associated periappendiceal inflammatory fat stranding. There is no evidence of associated perforation or abscess. No evidence of bowel dilatation. Vascular/Lymphatic: Aortic atherosclerosis. No enlarged abdominal or pelvic lymph nodes. Reproductive: Prostate is unremarkable. Other: No abdominal wall hernia or abnormality. No abdominopelvic ascites. Musculoskeletal: Marked severity degenerative changes are seen at the level of L5-S1. IMPRESSION: Mild appendicitis without associated perforation or abscess. Electronically Signed   By: TVirgina NorfolkM.D.   On: 04/29/2021 03:27    Review of Systems  Constitutional:  Negative for fever.  Respiratory:  Negative for shortness of breath.   Cardiovascular:  Negative for chest pain.  Gastrointestinal:  Positive for abdominal pain.   Blood pressure 101/77, pulse 64, temperature 98.4 F (36.9 C), temperature source Oral, resp. rate 17, SpO2  92 %. Physical Exam Constitutional:      General: He is not in acute distress.    Appearance: He is well-developed.     Comments: Somnolent   HENT:     Head: Normocephalic and atraumatic.  Eyes:     General: No scleral icterus.    Pupils: Pupils are equal, round, and reactive to light.  Cardiovascular:     Rate and Rhythm: Normal rate.  Pulmonary:     Effort: Pulmonary effort is normal.     Breath sounds: Normal breath sounds.  Abdominal:     General: There is no distension.     Palpations: Abdomen is soft.     Tenderness: There is abdominal tenderness in the right lower quadrant.     Hernia: No hernia is present.  Skin:    General: Skin is warm and dry.     Capillary Refill: Capillary refill takes less than 2 seconds.  Neurological:     General: No focal deficit present.  Psychiatric:        Mood and Affect: Mood normal.        Behavior: Behavior normal.     Assessment/Plan Appendicitis -discussed surgery for appendicitis- plan for lap appy today with Dr RRosendo Gros-abx written for by me -possible dc later today -uds negative  Rolm Bookbinder, MD 04/29/2021, 6:08 AM

## 2021-04-29 NOTE — Anesthesia Postprocedure Evaluation (Deleted)
Anesthesia Post Note  Patient: Lucas Stark  Procedure(s) Performed: APPENDECTOMY LAPAROSCOPIC (Abdomen)     Patient location during evaluation: PACU Anesthesia Type: General Level of consciousness: awake and alert Pain management: pain level controlled Vital Signs Assessment: post-procedure vital signs reviewed and stable Respiratory status: spontaneous breathing, nonlabored ventilation and respiratory function stable Cardiovascular status: blood pressure returned to baseline and stable Postop Assessment: no apparent nausea or vomiting Anesthetic complications: no   No notable events documented.  Last Vitals:  Vitals:   04/29/21 1051 04/29/21 1239  BP: 138/79 129/85  Pulse: 64 96  Resp: 18 15  Temp: 36.5 C 36.4 C  SpO2: 95% 95%    Last Pain:  Vitals:   04/29/21 1239  TempSrc:   PainSc: 10-Worst pain ever                 Merlinda Frederick

## 2021-04-29 NOTE — Transfer of Care (Signed)
Immediate Anesthesia Transfer of Care Note  Patient: Lucas Stark  Procedure(s) Performed: APPENDECTOMY LAPAROSCOPIC (Abdomen)  Patient Location: PACU  Anesthesia Type:General  Level of Consciousness: awake and alert   Airway & Oxygen Therapy: Patient Spontanous Breathing  Post-op Assessment: Report given to RN, Post -op Vital signs reviewed and stable and Patient moving all extremities X 4  Post vital signs: Reviewed and stable  Last Vitals:  Vitals Value Taken Time  BP 129/85 04/29/21 1238  Temp    Pulse 96 04/29/21 1239  Resp 15 04/29/21 1239  SpO2 95 % 04/29/21 1239  Vitals shown include unvalidated device data.  Last Pain:  Vitals:   04/29/21 1051  TempSrc: Oral  PainSc:       Patients Stated Pain Goal: 4 ([id number])  Complications: No notable events documented.

## 2021-04-29 NOTE — Anesthesia Preprocedure Evaluation (Addendum)
Anesthesia Evaluation  Patient identified by MRN, date of birth, ID band Patient awake    Reviewed: Allergy & Precautions, NPO status , Patient's Chart, lab work & pertinent test results  Airway Mallampati: II  TM Distance: >3 FB Neck ROM: Full    Dental  (+) Poor Dentition Multiple missing:   Pulmonary Current Smoker,    Pulmonary exam normal breath sounds clear to auscultation       Cardiovascular hypertension, Pt. on medications and Pt. on home beta blockers + CAD, + Past MI and + Cardiac Stents (NSTEMI 2017)  Normal cardiovascular exam Rhythm:Regular Rate:Normal  11/11/20 ECHO  1. Left ventricular ejection fraction, by estimation, is 40 to 45%. The  left ventricle has mildly decreased function. The left ventricle  demonstrates global hypokinesis. Left ventricular diastolic parameters are  consistent with Grade I diastolic  dysfunction (impaired relaxation).  2. Right ventricular systolic function is normal. The right ventricular  size is normal.  3. The mitral valve is normal in structure. Mild mitral valve  regurgitation. No evidence of mitral stenosis.  4. The aortic valve is tricuspid. Aortic valve regurgitation is not  visualized. No aortic stenosis is present.  5. The inferior vena cava is normal in size with greater than 50%  respiratory variability, suggesting right atrial pressure of 3 mmHg.    Neuro/Psych PSYCHIATRIC DISORDERS negative neurological ROS     GI/Hepatic GERD  Medicated,(+)     substance abuse (h/o cocaine use (none since 2017 per patient))  alcohol use, Acute appendicitis   Endo/Other  negative endocrine ROS  Renal/GU negative Renal ROS  negative genitourinary   Musculoskeletal  (+) Arthritis , Osteoarthritis,    Abdominal   Peds negative pediatric ROS (+)  Hematology negative hematology ROS (+)   Anesthesia Other Findings   Reproductive/Obstetrics negative OB ROS                            Anesthesia Physical Anesthesia Plan  ASA: 3 and emergent  Anesthesia Plan: General   Post-op Pain Management:    Induction: Intravenous and Rapid sequence  PONV Risk Score and Plan: Treatment may vary due to age or medical condition, Ondansetron, Dexamethasone and Midazolam  Airway Management Planned: Oral ETT  Additional Equipment: None  Intra-op Plan:   Post-operative Plan: Extubation in OR  Informed Consent: I have reviewed the patients History and Physical, chart, labs and discussed the procedure including the risks, benefits and alternatives for the proposed anesthesia with the patient or authorized representative who has indicated his/her understanding and acceptance.     Dental advisory given  Plan Discussed with: CRNA, Anesthesiologist and Surgeon  Anesthesia Plan Comments:         Anesthesia Quick Evaluation

## 2021-04-29 NOTE — Progress Notes (Signed)
Called Dr Elgie Congo regarding patient's beta blocker medication.  Per Dr Elgie Congo, do not give beta blocker in SS. Verified telephone order.

## 2021-04-29 NOTE — Anesthesia Postprocedure Evaluation (Signed)
Anesthesia Post Note  Patient: Lucas Stark  Procedure(s) Performed: APPENDECTOMY LAPAROSCOPIC (Abdomen)     Patient location during evaluation: PACU Anesthesia Type: General Level of consciousness: awake and alert Pain management: pain level controlled Vital Signs Assessment: post-procedure vital signs reviewed and stable Respiratory status: spontaneous breathing, nonlabored ventilation, respiratory function stable and patient connected to nasal cannula oxygen Cardiovascular status: blood pressure returned to baseline and stable Postop Assessment: no apparent nausea or vomiting Anesthetic complications: no   No notable events documented.  Last Vitals:  Vitals:   04/29/21 1409 04/29/21 1415  BP: 123/74   Pulse: 96 93  Resp: (!) 23 (!) 23  Temp:  (!) 36.3 C  SpO2: 93% 94%    Last Pain:  Vitals:   04/29/21 1239  TempSrc:   PainSc: 10-Worst pain ever                 Merlinda Frederick

## 2021-04-29 NOTE — Op Note (Signed)
04/29/2021  12:23 PM  PATIENT:  Lucas Stark  65 y.o. male  PRE-OPERATIVE DIAGNOSIS:  Appendicitis  POST-OPERATIVE DIAGNOSIS:  Acute Appendicitis  PROCEDURE:  Procedure(s): APPENDECTOMY LAPAROSCOPIC (N/A)  SURGEON:  Surgeon(s) and Role:    Ralene Ok, MD - Primary  ASSISTANTS: Ewell Poe, RNFA   ANESTHESIA:   local and general  EBL:  minimal   BLOOD ADMINISTERED:none  DRAINS: none   LOCAL MEDICATIONS USED:  BUPIVICAINE   SPECIMEN:  Source of Specimen:  appendix  DISPOSITION OF SPECIMEN:  PATHOLOGY  COUNTS:  YES  TOURNIQUET:  * No tourniquets in log *  DICTATION: .Dragon Dictation  Complications: none  Counts: reported as correct x 2  Findings:  The patient had a acutely inflamed non perforated appendix  Specimen: Appendix  Indications for procedure:  The patient is a 65 year old male with a history of periumbilical pain localized in the right lower quadrant patient had a CT scan which revealed signs consistent with acute appendicitis the patient back in for laparoscopic appendectomy.  Details of the procedure:The patient was taken back to the operating room. The patient was placed in supine position with bilateral SCDs in place. The patient was prepped and draped in the usual sterile fashion.  After appropriate anitbiotics were confirmed, a time-out was confirmed and all facts were verified.    A pneumoperitoneum of 14 mmHg was obtained via a Veress needle technique in the left lower quadrant quadrant.  A 5 mm trocar and 5 mm camera then placed intra-abdominally there is no injury to any intra-abdominal organs a 10 mm infraumbilical port was placed and direct visualization as was a 5 mm port in the suprapubic area.   The appendix was identified and seen to be non-perforated.  The appendix was cleaned down to the appendiceal base. The mesoappendix was then incised and the appendiceal artery was cauterized.  The the appendiceal base was clean.  A gold  hemoclip was placed proximallyx2 and one distally and the appendix was transected between these 2. A retrieval bag was then placed into the abdomen and the specimen placed in the bag. The appendiceal stump was cauterized. We evacuate the fluid from the pelvis until the effluent was clear.  The appendix and retrieval  bag was then retrieved via the supraumbilical port. #1 Vicryl was used to reapproximate the fascia at the umbilical port site x1. The skin was reapproximated all port sites 3-0 Monocryl subcuticular fashion. The skin was dressed with Dermabond.  The patient had the foley removed. The patient was awakened from general anesthesia was taken to recovery room in stable condition.     PLAN OF CARE: Discharge to home after PACU  PATIENT DISPOSITION:  PACU - hemodynamically stable.   Delay start of Pharmacological VTE agent (>24hrs) due to surgical blood loss or risk of bleeding: not applicable

## 2021-04-29 NOTE — Anesthesia Procedure Notes (Signed)
Procedure Name: Intubation Date/Time: 04/29/2021 11:47 AM Performed by: Harden Mo, CRNA Pre-anesthesia Checklist: Patient identified, Emergency Drugs available, Suction available and Patient being monitored Patient Re-evaluated:Patient Re-evaluated prior to induction Oxygen Delivery Method: Circle System Utilized Preoxygenation: Pre-oxygenation with 100% oxygen Induction Type: IV induction, Rapid sequence and Cricoid Pressure applied Laryngoscope Size: Miller and 2 Grade View: Grade I Tube type: Oral Tube size: 7.5 mm Number of attempts: 1 Airway Equipment and Method: Stylet and Oral airway Placement Confirmation: ETT inserted through vocal cords under direct vision, positive ETCO2 and breath sounds checked- equal and bilateral Secured at: 23 cm Tube secured with: Tape Dental Injury: Teeth and Oropharynx as per pre-operative assessment

## 2021-04-30 ENCOUNTER — Encounter (HOSPITAL_COMMUNITY): Payer: Self-pay | Admitting: General Surgery

## 2021-04-30 LAB — SURGICAL PATHOLOGY

## 2021-04-30 NOTE — Discharge Summary (Signed)
Patient ID: Lucas Stark [id number] 30-Oct-1955 65 y.o.  Admit date: 04/28/2021 Discharge date: 04/30/2021  Admitting Diagnosis: appendicitis  Discharge Diagnosis Patient Active Problem List   Diagnosis Date Noted   Appendicitis 04/29/2021   Primary localized osteoarthritis of knee 11/13/2018   Primary osteoarthritis of left knee 10/24/2018   Knee effusion, left 08/15/2018   Hypertensive heart disease without CHF 03/15/2016   Dyslipidemia, goal LDL below 70 03/15/2016   CAD S/P percutaneous coronary angioplasty 03/15/2016   Cardiomyopathy, ischemic 03/15/2016   Status post coronary artery stent placement    History of non-ST elevation myocardial infarction (NSTEMI) 03/12/2016   Benign essential HTN 03/12/2016   Tobacco abuse 03/12/2016   Cocaine use 03/12/2016   Alcohol abuse 03/12/2016    Consultants none  Reason for Admission: The patient was found to have appendicitis  Procedures Lap appy, Dr. Rosendo Gros 04/29/21  Hospital Course:  The patient was admitted and underwent a laparoscopic appendectomy.  The patient tolerated the procedure well.  On POD 0, the patient was tolerating a diet, voiding well, mobilizing, and pain was controlled with oral pain medications.  The patient was stable for DC home from PACU at this time with appropriate follow up made.   Allergies as of 04/29/2021       Reactions   Advil [ibuprofen]    " I cant take, I had a stent put in, I have to take tylenol"   Other Itching   Prescription Sleeping pills, Unsure of the name of the sleeping pill   Oxycodone    Nervousness, sweaty, itchy        Medication List     TAKE these medications    acetaminophen 500 MG tablet Commonly known as: TYLENOL Take 1,000 mg by mouth every 6 (six) hours as needed for moderate pain or headache.   aspirin EC 81 MG tablet Take 81 mg by mouth daily. Swallow whole.   carvedilol 6.25 MG tablet Commonly known as: COREG TAKE 1 TABLET BY MOUTH 2 TIMES  DAILY What changed: when to take this   chlorthalidone 25 MG tablet Commonly known as: HYGROTON TAKE 1/2 TABLET BY MOUTH EVERY DAY   D3 Super Strength 50 MCG (2000 UT) Caps Generic drug: Cholecalciferol Take 2,000 Units by mouth daily.   ezetimibe 10 MG tablet Commonly known as: ZETIA Take 1 tablet (10 mg total) by mouth daily.   losartan 100 MG tablet Commonly known as: COZAAR Take 1 tablet (100 mg total) by mouth daily.   Nexletol 180 MG Tabs Generic drug: Bempedoic Acid Take 1 tablet by mouth daily. What changed: how much to take   nitroGLYCERIN 0.4 MG SL tablet Commonly known as: Nitrostat Place 1 tablet (0.4 mg total) under the tongue every 5 (five) minutes as needed for chest pain.   pantoprazole 20 MG tablet Commonly known as: PROTONIX TAKE 1 TABLET BY MOUTH EVERY DAY   rosuvastatin 40 MG tablet Commonly known as: CRESTOR Take 1 tablet (40 mg total) by mouth daily.   sildenafil 20 MG tablet Commonly known as: REVATIO Take 20 mg by mouth daily as needed (ed).   sucralfate 1 g tablet Commonly known as: Carafate Take 1 tablet (1 g total) by mouth 4 (four) times daily -  with meals and at bedtime.   traMADol 50 MG tablet Commonly known as: Ultram Take 1 tablet (50 mg total) by mouth every 6 (six) hours as needed.          Follow-up Information  [facility] [place] Follow up in 3 week(s).   Specialty: General Surgery Why: Please arrive 30 minutes prior to your appointment time for paperwork and check in process Contact information: [address] [place] [address] [phone]                 Signed: Saverio Danker, [facility] 04/30/2021, 1:12 PM Please see Amion for pager number during day hours 7:00am-4:30pm, 7-11:30am on Weekends

## 2021-05-11 ENCOUNTER — Telehealth: Payer: Self-pay | Admitting: Cardiology

## 2021-05-11 NOTE — Telephone Encounter (Signed)
Patient is requesting to review his medication list. He states he takes a lot of medication and he is started to get them all confused. He would like to know what he needs to be taking and how to take it. Please return call to discuss.

## 2021-05-11 NOTE — Telephone Encounter (Signed)
Left a message for the patient to call back.  

## 2021-05-12 NOTE — Telephone Encounter (Signed)
Follow up:      Patient calling back from yesterday, his phone was messed up.

## 2021-05-12 NOTE — Telephone Encounter (Signed)
Spoke to patient about his medications.Each medication reviewed on directions and reason for taking.Patient voiced understanding.Advised to call back if he has any more questions.

## 2021-05-18 ENCOUNTER — Telehealth: Payer: Self-pay | Admitting: Cardiology

## 2021-05-18 NOTE — Telephone Encounter (Signed)
New Message:     Patient wants to know if he can come in day this week to have a visit with the Nurse? He says he need to come in person and go over his medicine with the Nurse please.

## 2021-05-18 NOTE — Telephone Encounter (Signed)
Med list reviewed and Pt verbalizes understanding./cy

## 2021-07-13 ENCOUNTER — Other Ambulatory Visit: Payer: Self-pay | Admitting: Cardiology

## 2021-11-02 ENCOUNTER — Telehealth: Payer: Self-pay | Admitting: Cardiology

## 2021-11-02 NOTE — Telephone Encounter (Signed)
Lmtcb.

## 2021-11-02 NOTE — Telephone Encounter (Addendum)
Pt c/o medication issue:  1. Name of Medication:  carvedilol (COREG) 6.25 MG tablet chlorthalidone (HYGROTON) 25 MG tablet losartan (COZAAR) 100 MG tablet pantoprazole (PROTONIX) 20 MG tablet rosuvastatin (CRESTOR) 40 MG tablet ezetimibe (ZETIA) 10 MG tablet  2. How are you currently taking this medication (dosage and times per day)?  TAKE 1 TABLET BY MOUTH 2 TIMES DAILYPatient taking differently: Take 6.25 mg by mouth 2 (two) times daily with a meal. TAKE 1/2 TABLET BY MOUTH EVERY DAY Take 1 tablet (100 mg total) by mouth daily. TAKE 1 TABLET BY MOUTH EVERY DAYPatient taking differently: Take 20 mg by mouth daily. Take 1 tablet (40 mg total) by mouth daily Take 1 tablet (10 mg total) by mouth daily   3. Are you having a reaction (difficulty breathing--STAT)?   4. What is your medication issue? Patient states when he takes this medication is makes him nerves. At night he sweats and has headaches, this has been happening for the past two weeks.  He states when he doesn't take the medication he feels alright.  Patient is not sure which medication is causing this.  He also said he takes a multi vitamins, and vitamin D.

## 2021-11-09 NOTE — Telephone Encounter (Signed)
Called patient, LVM to call back.  Left call back number.   

## 2021-11-09 NOTE — Telephone Encounter (Signed)
Patient returning call.

## 2021-11-11 NOTE — Telephone Encounter (Signed)
Spoke with patient who states there is a medication that makes him feel "funny in the morning and sweat at night."  He is not sure which medication it is and wants to be seen soon by Dr. Stanford Breed. He stated he is not taking his medications and needs to know what medications to take. ?

## 2021-11-11 NOTE — Telephone Encounter (Signed)
Follow Up: ? ? ? ?Patient is returning call from Tuesday(11-09-21). ?

## 2021-11-12 NOTE — Telephone Encounter (Signed)
Appt scheduled 3/10 with Diona Browner, NP.  Advised to bring all medications with him to appointment.   ?

## 2021-11-18 NOTE — Progress Notes (Signed)
Office Visit    Patient Name: Lucas Stark Date of Encounter: 11/19/2021  Primary Care Provider:  Vassie Moment, MD Primary Cardiologist:  Kirk Ruths, MD  Chief Complaint    66 year old male with a history of CAD, chronic combined systolic and diastolic heart failure, ICM, hypertension, hyperlipidemia, GERD, former polysubstance use (alcohol and cocaine), osteoarthritis, and tobacco use presents for follow-up related to CAD and heart failure.  Past Medical History    Past Medical History:  Diagnosis Date   CAD (coronary artery disease) cardiologist-- dr Stanford Breed   a. 03/2016 NSTEMI/PCI: LM nl, LAD nl, RI nl, LCX nl, OM2 99 ( 2.75 x 16 Synergy DES), RCA nl, EF 45-50%.   ETOH abuse    GERD (gastroesophageal reflux disease)    History of cocaine abuse ([place])    per pt none since 2017   History of non-ST elevation myocardial infarction (NSTEMI) 03/14/2016   s/p  PCI and DES x1   Hyperlipidemia    Hypertension    Hypertensive heart disease    Ischemic cardiomyopathy    a. 03/2016 Echo: EF 35-40%, diff H, sev inflat HK, Gr2 DD;  b. 03/2016 EF 45-50% by LV gram.;  echo 10-24-2018, ef 45-50%   Myocardial infarction ([place]) 2017   NSTEMI    OA (osteoarthritis)    left knee   S/P drug eluting coronary stent placement 03/14/2016   DES x1 to OM1   Tobacco abuse    Wears dentures    upper   Wears partial dentures    lower   Past Surgical History:  Procedure Laterality Date   CARDIAC CATHETERIZATION N/A 03/14/2016   Procedure: Left Heart Cath and Coronary Angiography;  Surgeon: Burnell Blanks, MD;  Location: [facility];  Service: Cardiovascular;  Laterality: N/A;   CORONARY ANGIOPLASTY WITH STENT PLACEMENT     FACIAL COSMETIC SURGERY     at age 3 yrs old   [place]  age 63   face around eye   LAPAROSCOPIC APPENDECTOMY N/A 04/29/2021   Procedure: APPENDECTOMY LAPAROSCOPIC;  Surgeon: Ralene Ok, MD;  Location: [facility];  Service: General;  Laterality:  N/A;   PARTIAL KNEE ARTHROPLASTY Left 11/13/2018   Procedure: UNICOMPARTMENTAL KNEE;  Surgeon: Renette Butters, MD;  Location: [facility];  Service: Orthopedics;  Laterality: Left;   WRIST SURGERY Left 01/31/2020    Allergies  Allergies  Allergen Reactions   Advil [Ibuprofen]     " I cant take, I had a stent put in, I have to take tylenol"   Other Itching    Prescription Sleeping pills, Unsure of the name of the sleeping pill   Oxycodone     Nervousness, sweaty, itchy    History of Present Illness    66 year old male with the above past medical history including CAD, chronic combined systolic and diastolic heart failure, ICM, hypertension, hyperlipidemia, GERD, former polysubstance use (alcohol and cocaine), osteoarthritis, and tobacco use.  He was admitted to the hospital in July 2017 in the setting of non-STEMI.  Cardiac catheterization at the time showed OM2 99%-0 s/p DES, otherwise normal coronaries, mild LV systolic dysfunction. Echocardiogram in July 2017 showed EF 35 to 40%, G2 DD.  He did not tolerate ACE inhibitor secondary to cough.  Stress test in February 2019 showed prior inferior infarct, but no ischemia, EF 34%.  Most recent echocardiogram in March 2022 showed EF 40 to 45%, G1 DD, mild mitral valve regurgitation.  Of note, he does have a  former history of polysubstance abuse, with last reported cocaine use in 2017.  He was last seen in the office on 01/04/2021 and was stable from a cardiac standpoint. His BP was somewhat elevated, and his losartan was increased. He was started on Nexletol per Pharm.D. He was hospitalized in August 2022 in the setting of appendicitis s/p laparoscopic appendectomy. He called the office on 11/09/2021 and reported a "funny feeling" in the morning, sweats at night. He stated he was unsure what medication he needed to be taking. He was advised to follow-up in the office.   He presents today for follow-up. Since he called our office since his last visit  dates he is feeling much better.  He has not taken any medication this week. In reviewing his medications today, he notes that prior to this week he was taking 2 tablets of losartan 100 mg daily, for a total of 200 mg daily.  Since he stopped this, he no longer has the "funny feeling" that he had nor does he have night sweats. He thinks that this was likely contributing to his symptoms. He has not been checking his BP at home, he states he has a wrist cuff, she thinks does not work very well.  Otherwise, he reports feeling well, denies any specific concerns or complaints today.  Home Medications    Current Outpatient Medications  Medication Sig Dispense Refill   acetaminophen (TYLENOL) 500 MG tablet Take 1,000 mg by mouth every 6 (six) hours as needed for moderate pain or headache.     aspirin EC 81 MG tablet Take 81 mg by mouth daily. Swallow whole.     carvedilol (COREG) 6.25 MG tablet TAKE 1 TABLET BY MOUTH 2 TIMES DAILY (Patient taking differently: Take 6.25 mg by mouth 2 (two) times daily with a meal.) 180 tablet 3   chlorthalidone (HYGROTON) 25 MG tablet TAKE 1/2 TABLET BY MOUTH EVERY DAY 45 tablet 3   D3 SUPER STRENGTH 2000 units CAPS Take 2,000 Units by mouth daily.  0   ezetimibe (ZETIA) 10 MG tablet Take 1 tablet (10 mg total) by mouth daily. 90 tablet 3   nitroGLYCERIN (NITROSTAT) 0.4 MG SL tablet Place 1 tablet (0.4 mg total) under the tongue every 5 (five) minutes as needed for chest pain. 25 tablet 3   pantoprazole (PROTONIX) 20 MG tablet TAKE 1 TABLET BY MOUTH EVERY DAY (Patient taking differently: Take 20 mg by mouth daily.) 30 tablet 3   rosuvastatin (CRESTOR) 40 MG tablet Take 1 tablet (40 mg total) by mouth daily. 90 tablet 3   losartan (COZAAR) 100 MG tablet Take 1 tablet (100 mg total) by mouth daily. 90 tablet 3   Current Facility-Administered Medications  Medication Dose Route Frequency Provider Last Rate Last Admin   nitroGLYCERIN (NITROSTAT) SL tablet 0.4 mg  0.4 mg  Sublingual Q5 min PRN Elouise Munroe, MD   0.4 mg at 09/30/20 1623     Review of Systems    He denies chest pain, palpitations, dyspnea, pnd, orthopnea, n, v, dizziness, syncope, edema, weight gain, or early satiety. All other systems reviewed and are otherwise negative except as noted above.   Physical Exam    VS:  BP 140/80 (BP Location: Left Arm, Patient Position: Sitting)    Pulse 84    Ht '5\' 6"'$  (1.676 m)    Wt 179 lb 12.8 oz (81.6 kg)    SpO2 94%    BMI 29.02 kg/m   GEN: Well nourished, well developed,  in no acute distress. HEENT: normal. Neck: Supple, no JVD, carotid bruits, or masses. Cardiac: RRR, no murmurs, rubs, or gallops. No clubbing, cyanosis, edema.  Radials/DP/PT 2+ and equal bilaterally.  Respiratory:  Respirations regular and unlabored, clear to auscultation bilaterally. GI: Soft, nontender, nondistended, BS + x 4. MS: no deformity or atrophy. Skin: warm and dry, no rash. Neuro:  Strength and sensation are intact. Psych: Normal affect.  Accessory Clinical Findings    ECG personally reviewed by me today - No EKG in office today.  Lab Results  Component Value Date   WBC 6.9 04/29/2021   HGB 16.5 04/29/2021   HCT 47.3 04/29/2021   MCV 90.8 04/29/2021   PLT 190 04/29/2021   Lab Results  Component Value Date   CREATININE 1.01 04/29/2021   BUN 11 04/29/2021   NA 137 04/29/2021   K 3.8 04/29/2021   CL 103 04/29/2021   CO2 26 04/29/2021   Lab Results  Component Value Date   ALT 20 04/29/2021   AST 25 04/29/2021   ALKPHOS 49 04/29/2021   BILITOT 0.8 04/29/2021   Lab Results  Component Value Date   CHOL 129 02/25/2021   HDL 48 02/25/2021   LDLCALC 64 02/25/2021   TRIG 91 02/25/2021   CHOLHDL 2.7 02/25/2021    Lab Results  Component Value Date   HGBA1C 5.7 (H) 03/12/2016    Assessment & Plan    1. CAD: S/p NSTEMI, DES OM2 99%-0 in 2017. Stable with no anginal symptoms. No indication for ischemic evaluation.  Continue aspirin, carvedilol,  chlorthalidone, losartan, Zetia, and Crestor.  2. Combined systolic and diastolic heart failure/ICM: Echo in July 2017 showed EF 35 to 40%, G2 DD. Most recent echo in March 2022 showed EF 40 to 45%, G1 DD, mild mitral valve regurgitation. Euvolemic and well compensated on exam.  Continue current medications as above.  3. Hypertension: BP slightly above goal in office today.  He has not taken any medication this week.  Upon reviewing his medications today, he states he had been taking 2 tablets of losartan 100 mg daily, for total of 200 mg daily.  He was feeling funny during this time and was experiencing night sweats.  Since he stopped taking a double dose of losartan, he is feeling much better.  We reviewed his medications in detail today.  Provided BP cuff in office today for ongoing home BP monitoring.  He will keep a log and bring it to his next visit. Will check CMET today, given recent high dose of Losartan, and as he is due for LFTs. For now, continue current medications as above.    4.  Mitral valve regurgitation: Mild on most recent echo.  Asymptomatic. Consider repeat echo in 2 years, sooner if symptoms occur.  5. Hyperlipidemia: LDL was 64 in June 2022.  Previously declined PCSK9 inhibitor (he did not want to take injectable medication), he was started on Nexletol per pharmacy which he is no longer taking.  He is due for repeat lipids, LFTs. Will check CMET as above. Order placed for fasting lipid panel, he will return to have this drawn in clinic.  If LDL above goal, consider restarting Nexletol.  6. Tobacco use: Previously on Chantix. He continues to smoke, he is no longer taking Chantix as it gave him nightmares.  Full cessation advised.  7. Disposition: Follow-up in 1 month.  Lenna Sciara, NP 11/19/2021, 2:48 PM

## 2021-11-19 ENCOUNTER — Other Ambulatory Visit: Payer: Self-pay

## 2021-11-19 ENCOUNTER — Encounter: Payer: Self-pay | Admitting: Nurse Practitioner

## 2021-11-19 ENCOUNTER — Ambulatory Visit (INDEPENDENT_AMBULATORY_CARE_PROVIDER_SITE_OTHER): Payer: Medicaid Other | Admitting: Nurse Practitioner

## 2021-11-19 VITALS — BP 140/80 | HR 84 | Ht 66.0 in | Wt 179.8 lb

## 2021-11-19 DIAGNOSIS — I5042 Chronic combined systolic (congestive) and diastolic (congestive) heart failure: Secondary | ICD-10-CM

## 2021-11-19 DIAGNOSIS — I2510 Atherosclerotic heart disease of native coronary artery without angina pectoris: Secondary | ICD-10-CM

## 2021-11-19 DIAGNOSIS — Z720 Tobacco use: Secondary | ICD-10-CM

## 2021-11-19 DIAGNOSIS — E785 Hyperlipidemia, unspecified: Secondary | ICD-10-CM

## 2021-11-19 DIAGNOSIS — I10 Essential (primary) hypertension: Secondary | ICD-10-CM

## 2021-11-19 DIAGNOSIS — I340 Nonrheumatic mitral (valve) insufficiency: Secondary | ICD-10-CM

## 2021-11-19 DIAGNOSIS — I255 Ischemic cardiomyopathy: Secondary | ICD-10-CM

## 2021-11-19 NOTE — Patient Instructions (Signed)
Medication Instructions:  ?Your physician recommends that you continue on your current medications as directed. Please refer to the Current Medication list given to you today.  ? ?*If you need a refill on your cardiac medications before your next appointment, please call your pharmacy* ? ? ?Lab Work: ?Your physician recommends that you complete labs today and return in 1 week for fasting labs ?CMP ?Fasting Lipid (1 Week) ? ?If you have labs (blood work) drawn today and your tests are completely normal, you will receive your results only by: ?MyChart Message (if you have MyChart) OR ?A paper copy in the mail ?If you have any lab test that is abnormal or we need to change your treatment, we will call you to review the results. ? ? ?Testing/Procedures: ?NONE ordered at this time of appointment  ? ? ? ?Follow-Up: ?At [facility], you and your health needs are our priority.  As part of our continuing mission to provide you with exceptional heart care, we have created designated Provider Care Teams.  These Care Teams include your primary Cardiologist (physician) and Advanced Practice Providers (APPs -  Physician Assistants and Nurse Practitioners) who all work together to provide you with the care you need, when you need it. ? ?We recommend signing up for the patient portal called "MyChart".  Sign up information is provided on this After Visit Summary.  MyChart is used to connect with patients for Virtual Visits (Telemedicine).  Patients are able to view lab/test results, encounter notes, upcoming appointments, etc.  Non-urgent messages can be sent to your provider as well.   ?To learn more about what you can do with MyChart, go to [email or web address].   ? ?Your next appointment:   ?1 month(s) ? ?The format for your next appointment:   ?In Person ? ?Provider:   ?Diona Browner, NP      ? ? ?Other Instructions ?Monitor Blood Pressure and report if blood pressure is consistently greater than 130/80. Blood pressure cuff  given in office today.  ? ?

## 2021-11-20 LAB — COMPREHENSIVE METABOLIC PANEL
ALT: 11 IU/L (ref 0–44)
AST: 20 IU/L (ref 0–40)
Albumin/Globulin Ratio: 2 (ref 1.2–2.2)
Albumin: 4.5 g/dL (ref 3.8–4.8)
Alkaline Phosphatase: 80 IU/L (ref 44–121)
BUN/Creatinine Ratio: 14 (ref 10–24)
BUN: 13 mg/dL (ref 8–27)
Bilirubin Total: 0.6 mg/dL (ref 0.0–1.2)
CO2: 25 mmol/L (ref 20–29)
Calcium: 9.1 mg/dL (ref 8.6–10.2)
Chloride: 106 mmol/L (ref 96–106)
Creatinine, Ser: 0.95 mg/dL (ref 0.76–1.27)
Globulin, Total: 2.3 g/dL (ref 1.5–4.5)
Glucose: 93 mg/dL (ref 70–99)
Potassium: 4.6 mmol/L (ref 3.5–5.2)
Sodium: 143 mmol/L (ref 134–144)
Total Protein: 6.8 g/dL (ref 6.0–8.5)
eGFR: 89 mL/min/{1.73_m2} (ref >59)

## 2021-11-22 ENCOUNTER — Telehealth: Payer: Self-pay

## 2021-11-22 NOTE — Telephone Encounter (Addendum)
Left a detailed message of results for pt. Pt can call back with any questions or concerns if needed. ---- Message from Lenna Sciara, NP sent at 11/21/2021  4:21 PM EDT ----- ?Recent labs show stable kidney function and electrolytes.Continue current medications and follow-up as planned.  Thank you! ?

## 2021-12-20 NOTE — Progress Notes (Deleted)
? ? ?Office Visit  ?  ?Patient Name: Lucas Stark ?Date of Encounter: 12/20/2021 ? ?Primary Care Provider:  Vassie Moment, MD ?Primary Cardiologist:  Kirk Ruths, MD ? ?Chief Complaint  ?  ?66 year old male with a history of CAD, chronic combined systolic and diastolic heart failure, ICM, hypertension, hyperlipidemia, GERD, former polysubstance use (alcohol and cocaine), osteoarthritis, and tobacco use presents for follow-up related to CAD and heart failure. ? ?Past Medical History  ?  ?Past Medical History:  ?Diagnosis Date  ? CAD (coronary artery disease) cardiologist-- dr Stanford Breed  ? a. 03/2016 NSTEMI/PCI: LM nl, LAD nl, RI nl, LCX nl, OM2 99 ( 2.75 x 16 Synergy DES), RCA nl, EF 45-50%.  ? ETOH abuse   ? GERD (gastroesophageal reflux disease)   ? History of cocaine abuse ([place])   ? per pt none since 2017  ? History of non-ST elevation myocardial infarction (NSTEMI) 03/14/2016  ? s/p  PCI and DES x1  ? Hyperlipidemia   ? Hypertension   ? Hypertensive heart disease   ? Ischemic cardiomyopathy   ? a. 03/2016 Echo: EF 35-40%, diff H, sev inflat HK, Gr2 DD;  b. 03/2016 EF 45-50% by LV gram.;  echo 10-24-2018, ef 45-50%  ? Myocardial infarction [facility]) 2017  ? NSTEMI   ? OA (osteoarthritis)   ? left knee  ? S/P drug eluting coronary stent placement 03/14/2016  ? DES x1 to OM1  ? Tobacco abuse   ? Wears dentures   ? upper  ? Wears partial dentures   ? lower  ? ?Past Surgical History:  ?Procedure Laterality Date  ? CARDIAC CATHETERIZATION N/A 03/14/2016  ? Procedure: Left Heart Cath and Coronary Angiography;  Surgeon: Burnell Blanks, MD;  Location: [facility];  Service: Cardiovascular;  Laterality: N/A;  ? CORONARY ANGIOPLASTY WITH STENT PLACEMENT    ? FACIAL COSMETIC SURGERY    ? at age 89 yrs old  ? LACERATION REPAIR  age 61  ? face around eye  ? LAPAROSCOPIC APPENDECTOMY N/A 04/29/2021  ? Procedure: APPENDECTOMY LAPAROSCOPIC;  Surgeon: Ralene Ok, MD;  Location: [facility];  Service: General;  Laterality:  N/A;  ? PARTIAL KNEE ARTHROPLASTY Left 11/13/2018  ? Procedure: UNICOMPARTMENTAL KNEE;  Surgeon: Renette Butters, MD;  [facility];  Service: Orthopedics;  Laterality: Left;  ? WRIST SURGERY Left 01/31/2020  ? ? ?Allergies ? ?Allergies  ?Allergen Reactions  ? Advil [Ibuprofen]   ?  " I cant take, I had a stent put in, I have to take tylenol"  ? Other Itching  ?  Prescription Sleeping pills, Unsure of the name of the sleeping pill  ? Oxycodone   ?  Nervousness, sweaty, itchy  ? ? ?History of Present Illness  ?  ?66 year old male with the above past medical history including CAD, chronic combined systolic and diastolic heart failure, ICM, hypertension, hyperlipidemia, GERD, former polysubstance use (alcohol and cocaine), osteoarthritis, and tobacco use. ?  ?He was admitted to the hospital in July 2017 in the setting of non-STEMI.  Cardiac catheterization at the time showed OM2 99%-0 s/p DES, otherwise normal coronaries, mild LV systolic dysfunction. Echocardiogram in July 2017 showed EF 35 to 40%, G2 DD.  He did not tolerate ACE inhibitor secondary to cough.  Stress test in February 2019 showed prior inferior infarct, but no ischemia, EF 34%.  Most recent echocardiogram in March 2022 showed EF 40 to 45%, G1 DD, mild mitral valve regurgitation.  Of note, he does have  a former history of polysubstance abuse, with last reported cocaine use in 2017.  He was last seen in the office on 11/19/2021 and had noted a "funny feeling" in the morning, sweats at night. He stated he was unsure what medication he needed to be taking. Upon reviewing his medications, he had been taking 2 tablets of losartan 100 mg daily, for total of 200 mg daily.  His symptoms resolved with decreased dose of Losartan. ? ?He presents today for follow-up. Since his last viist  ?  ?1. CAD: S/p NSTEMI, DES OM2 99%-0 in 2017. Stable with no anginal symptoms. No indication for ischemic evaluation.  Continue aspirin, carvedilol, chlorthalidone, losartan,  Zetia, and Crestor. ?  ?2. Combined systolic and diastolic heart failure/ICM: Echo in July 2017 showed EF 35 to 40%, G2 DD. Most recent echo in March 2022 showed EF 40 to 45%, G1 DD, mild mitral valve regurgitation. Euvolemic and well compensated on exam.  Continue current medications as above. ? ?3. Hypertension:  Provided BP cuff in office today for ongoing home BP monitoring.   ? ?4.  Mitral valve regurgitation: Mild on most recent echo.  Asymptomatic. Consider repeat echo in 2 years, sooner if symptoms occur. ?  ?5. Hyperlipidemia: LDL was 64 in June 2022.  Previously declined PCSK9 inhibitor (he did not want to take injectable medication), he was started on Nexletol per pharmacy which he is no longer taking. Order placed for fasting lipid panel, he will return to have this drawn in clinic.  If LDL above goal, consider restarting Nexletol. ? ?6. Tobacco use: Previously on Chantix. He continues to smoke, he is no longer taking Chantix as it gave him nightmares.  Full cessation advised. ? ?7. Disposition:  ? ?Home Medications  ?  ?Current Outpatient Medications  ?Medication Sig Dispense Refill  ? acetaminophen (TYLENOL) 500 MG tablet Take 1,000 mg by mouth every 6 (six) hours as needed for moderate pain or headache.    ? aspirin EC 81 MG tablet Take 81 mg by mouth daily. Swallow whole.    ? carvedilol (COREG) 6.25 MG tablet TAKE 1 TABLET BY MOUTH 2 TIMES DAILY (Patient taking differently: Take 6.25 mg by mouth 2 (two) times daily with a meal.) 180 tablet 3  ? chlorthalidone (HYGROTON) 25 MG tablet TAKE 1/2 TABLET BY MOUTH EVERY DAY 45 tablet 3  ? D3 SUPER STRENGTH 2000 units CAPS Take 2,000 Units by mouth daily.  0  ? ezetimibe (ZETIA) 10 MG tablet Take 1 tablet (10 mg total) by mouth daily. 90 tablet 3  ? losartan (COZAAR) 100 MG tablet Take 1 tablet (100 mg total) by mouth daily. 90 tablet 3  ? nitroGLYCERIN (NITROSTAT) 0.4 MG SL tablet Place 1 tablet (0.4 mg total) under the tongue every 5 (five) minutes as  needed for chest pain. 25 tablet 3  ? pantoprazole (PROTONIX) 20 MG tablet TAKE 1 TABLET BY MOUTH EVERY DAY (Patient taking differently: Take 20 mg by mouth daily.) 30 tablet 3  ? rosuvastatin (CRESTOR) 40 MG tablet Take 1 tablet (40 mg total) by mouth daily. 90 tablet 3  ? ?Current Facility-Administered Medications  ?Medication Dose Route Frequency Provider Last Rate Last Admin  ? nitroGLYCERIN (NITROSTAT) SL tablet 0.4 mg  0.4 mg Sublingual Q5 min PRN Elouise Munroe, MD   0.4 mg at 09/30/20 1623  ?  ? ?Review of Systems  ?  ?***.  All other systems reviewed and are otherwise negative except as noted above. ?  ? ?  Physical Exam  ?  ?VS:  There were no vitals taken for this visit. , BMI There is no height or weight on file to calculate BMI. ?    ?GEN: Well nourished, well developed, in no acute distress. ?HEENT: normal. ?Neck: Supple, no JVD, carotid bruits, or masses. ?Cardiac: RRR, no murmurs, rubs, or gallops. No clubbing, cyanosis, edema.  Radials/DP/PT 2+ and equal bilaterally.  ?Respiratory:  Respirations regular and unlabored, clear to auscultation bilaterally. ?GI: Soft, nontender, nondistended, BS + x 4. ?MS: no deformity or atrophy. ?Skin: warm and dry, no rash. ?Neuro:  Strength and sensation are intact. ?Psych: Normal affect. ? ?Accessory Clinical Findings  ?  ?ECG personally reviewed by me today - *** - no acute changes. ? ?Lab Results  ?Component Value Date  ? WBC 6.9 04/29/2021  ? HGB 16.5 04/29/2021  ? HCT 47.3 04/29/2021  ? MCV 90.8 04/29/2021  ? PLT 190 04/29/2021  ? ?Lab Results  ?Component Value Date  ? CREATININE 0.95 11/19/2021  ? BUN 13 11/19/2021  ? NA 143 11/19/2021  ? K 4.6 11/19/2021  ? CL 106 11/19/2021  ? CO2 25 11/19/2021  ? ?Lab Results  ?Component Value Date  ? ALT 11 11/19/2021  ? AST 20 11/19/2021  ? ALKPHOS 80 11/19/2021  ? BILITOT 0.6 11/19/2021  ? ?Lab Results  ?Component Value Date  ? CHOL 129 02/25/2021  ? HDL 48 02/25/2021  ? [place] 64 02/25/2021  ? TRIG 91 02/25/2021  ?  CHOLHDL 2.7 02/25/2021  ?  ?Lab Results  ?Component Value Date  ? HGBA1C 5.7 (H) 03/12/2016  ? ? ?Assessment & Plan  ?  ?1.  *** ? ? ?Lenna Sciara, NP ?12/20/2021, 10:10 AM ?  ? ?

## 2021-12-22 ENCOUNTER — Ambulatory Visit: Payer: Medicaid Other | Admitting: Nurse Practitioner

## 2021-12-27 ENCOUNTER — Encounter: Payer: Self-pay | Admitting: Nurse Practitioner

## 2021-12-27 ENCOUNTER — Encounter: Payer: Self-pay | Admitting: Physician Assistant

## 2021-12-27 ENCOUNTER — Ambulatory Visit (INDEPENDENT_AMBULATORY_CARE_PROVIDER_SITE_OTHER): Payer: Medicaid Other | Admitting: Physician Assistant

## 2021-12-27 VITALS — BP 146/90 | HR 51 | Ht 66.0 in | Wt 175.8 lb

## 2021-12-27 DIAGNOSIS — I5042 Chronic combined systolic (congestive) and diastolic (congestive) heart failure: Secondary | ICD-10-CM

## 2021-12-27 DIAGNOSIS — I2510 Atherosclerotic heart disease of native coronary artery without angina pectoris: Secondary | ICD-10-CM | POA: Diagnosis not present

## 2021-12-27 DIAGNOSIS — I255 Ischemic cardiomyopathy: Secondary | ICD-10-CM

## 2021-12-27 DIAGNOSIS — I10 Essential (primary) hypertension: Secondary | ICD-10-CM

## 2021-12-27 DIAGNOSIS — E785 Hyperlipidemia, unspecified: Secondary | ICD-10-CM | POA: Diagnosis not present

## 2021-12-27 NOTE — Patient Instructions (Signed)
Medication Instructions:  ?Your physician recommends that you continue on your current medications as directed. Please refer to the Current Medication list given to you today. ? ?*If you need a refill on your cardiac medications before your next appointment, please call your pharmacy* ? ? ?Lab Work: ?Your physician recommends that you return for lab work in:  ?[place] ? ?If you have labs (blood work) drawn today and your tests are completely normal, you will receive your results only by: ?MyChart Message (if you have MyChart) OR ?A paper copy in the mail ?If you have any lab test that is abnormal or we need to change your treatment, we will call you to review the results. ? ?Testing/Procedures: ?NONE ordered at this time of appointment  ? ?Follow-Up: ?At [facility], you and your health needs are our priority.  As part of our continuing mission to provide you with exceptional heart care, we have created designated Provider Care Teams.  These Care Teams include your primary Cardiologist (physician) and Advanced Practice Providers (APPs -  Physician Assistants and Nurse Practitioners) who all work together to provide you with the care you need, when you need it. ? ?We recommend signing up for the patient portal called "MyChart".  Sign up information is provided on this After Visit Summary.  MyChart is used to connect with patients for Virtual Visits (Telemedicine).  Patients are able to view lab/test results, encounter notes, upcoming appointments, etc.  Non-urgent messages can be sent to your provider as well.   ?To learn more about what you can do with MyChart, go to [email or web address].   ? ?Your next appointment:   ?6-7 month(s) ? ?The format for your next appointment:   ?In Person ? ?Provider:   ?Kirk Ruths, MD \  ? ?Other Instructions ? ? ?Important Information About Sugar ? ? ? ? ? ? ?

## 2021-12-27 NOTE — Progress Notes (Signed)
?Cardiology Office Note:   ? ?Date:  12/29/2021  ? ?ID:  Lucas Stark, DOB 27-Jun-1956, MRN [id number] ? ?PCP:  Vassie Moment, MD ?  ?[facility] Providers ?Cardiologist:  Kirk Ruths, MD    ? ?Referring MD: Vassie Moment, MD  ? ?Chief Complaint  ?Patient presents with  ? Follow-up  ?  Seen for Dr. Stanford Breed  ? ? ?History of Present Illness:   ? ?Lucas Stark is a 66 y.o. male with a hx of CAD, chronic combined systolic and diastolic heart failure, ischemic cardiomyopathy, hypertension, hyperlipidemia, GERD, former polysubstance abuse (alcohol and cocaine), osteoarthritis and tobacco use.  Patient was admitted in July 2017 in the setting of NSTEMI.  Cardiac catheterization at that time revealed 99% OM2 treated with DES, otherwise normal coronary arteries, mild LV dysfunction.  Echocardiogram in July 2017 showed EF 35 to 40%, grade 2 DD.  He did not tolerate ACE inhibitor due to cough.  Stress test in February 2019 showed prior inferior infarct, no ischemia, EF 34%.  Most recent echocardiogram in March 2022 showed EF 40 to 45%, grade 1 DD, mild MR.  He was last seen in the office in April 2022 and was stable at the time.  He has since been started on Nexletol per Pharm.D.  He previously declined PCSK9 inhibitor.  He has been hospitalized in August 2020 in the setting of appendicitis and underwent laparoscopic appendectomy. ? ?Patient was last seen by Diona Browner NP on 11/29/2021, it was noted that patient mistakenly took 200 mg daily of losartan prior to the visit, since stopping this, he no longer had funny feeling or night sweats.  Since the last visit, patient has been doing well.  He denies any exertional chest discomfort or worsening shortness of breath.  He has no dizziness.  Home blood pressure readings has been in the 120s to 130s range.  Blood pressure obtained in the clinic today was borderline high however given normal blood pressure reading at home, I will hold off on adjusting his medication.  Heart  rate is in the low 50s at home.  I will obtain fasting lipid panel today.  He can follow-up with Dr. Stanford Breed in 6 to 7 months. ? ? ?Past Medical History:  ?Diagnosis Date  ? CAD (coronary artery disease) cardiologist-- dr Stanford Breed  ? a. 03/2016 NSTEMI/PCI: LM nl, LAD nl, RI nl, LCX nl, OM2 99 ( 2.75 x 16 Synergy DES), RCA nl, EF 45-50%.  ? ETOH abuse   ? GERD (gastroesophageal reflux disease)   ? History of cocaine abuse ([place])   ? per pt none since 2017  ? History of non-ST elevation myocardial infarction (NSTEMI) 03/14/2016  ? s/p  PCI and DES x1  ? Hyperlipidemia   ? Hypertension   ? Hypertensive heart disease   ? Ischemic cardiomyopathy   ? a. 03/2016 Echo: EF 35-40%, diff H, sev inflat HK, Gr2 DD;  b. 03/2016 EF 45-50% by LV gram.;  echo 10-24-2018, ef 45-50%  ? Myocardial infarction [facility]) 2017  ? NSTEMI   ? OA (osteoarthritis)   ? left knee  ? S/P drug eluting coronary stent placement 03/14/2016  ? DES x1 to OM1  ? Tobacco abuse   ? Wears dentures   ? upper  ? Wears partial dentures   ? lower  ? ? ?Past Surgical History:  ?Procedure Laterality Date  ? CARDIAC CATHETERIZATION N/A 03/14/2016  ? Procedure: Left Heart Cath and Coronary Angiography;  Surgeon: Burnell Blanks, MD;  Location: [facility];  Service: Cardiovascular;  Laterality: N/A;  ? CORONARY ANGIOPLASTY WITH STENT PLACEMENT    ? FACIAL COSMETIC SURGERY    ? at age 78 yrs old  ? LACERATION REPAIR  age 50  ? face around eye  ? LAPAROSCOPIC APPENDECTOMY N/A 04/29/2021  ? Procedure: APPENDECTOMY LAPAROSCOPIC;  Surgeon: Ralene Ok, MD;  Location: [facility];  Service: General;  Laterality: N/A;  ? PARTIAL KNEE ARTHROPLASTY Left 11/13/2018  ? Procedure: UNICOMPARTMENTAL KNEE;  Surgeon: Renette Butters, MD;  Location: [facility];  Service: Orthopedics;  Laterality: Left;  ? WRIST SURGERY Left 01/31/2020  ? ? ?Current Medications: ?Current Meds  ?Medication Sig  ? acetaminophen (TYLENOL) 500 MG tablet Take 1,000 mg by mouth every 6 (six) hours as needed  for moderate pain or headache.  ? aspirin EC 81 MG tablet Take 81 mg by mouth daily. Swallow whole.  ? carvedilol (COREG) 6.25 MG tablet TAKE 1 TABLET BY MOUTH 2 TIMES DAILY (Patient taking differently: Take 6.25 mg by mouth 2 (two) times daily with a meal.)  ? chlorthalidone (HYGROTON) 25 MG tablet TAKE 1/2 TABLET BY MOUTH EVERY DAY  ? D3 SUPER STRENGTH 2000 units CAPS Take 2,000 Units by mouth daily.  ? ezetimibe (ZETIA) 10 MG tablet Take 1 tablet (10 mg total) by mouth daily.  ? losartan (COZAAR) 100 MG tablet Take 1 tablet (100 mg total) by mouth daily.  ? nitroGLYCERIN (NITROSTAT) 0.4 MG SL tablet Place 1 tablet (0.4 mg total) under the tongue every 5 (five) minutes as needed for chest pain.  ? rosuvastatin (CRESTOR) 40 MG tablet Take 1 tablet (40 mg total) by mouth daily.  ? [DISCONTINUED] pantoprazole (PROTONIX) 20 MG tablet TAKE 1 TABLET BY MOUTH EVERY DAY  ? ?Current Facility-Administered Medications for the 12/27/21 encounter (Office Visit) with Almyra Deforest, [place]  ?Medication  ? nitroGLYCERIN (NITROSTAT) SL tablet 0.4 mg  ?  ? ?Allergies:   Advil [ibuprofen], Other, and Oxycodone  ? ?Social History  ? ?Socioeconomic History  ? Marital status: Legally Separated  ?  Spouse name: Not on file  ? Number of children: 8  ? Years of education: Not on file  ? Highest education level: Not on file  ?Occupational History  ? Not on file  ?Tobacco Use  ? Smoking status: Some Days  ?  Packs/day: 1.00  ?  Years: 35.00  ?  Pack years: 35.00  ?  Types: Cigarettes  ? Smokeless tobacco: Never  ? Tobacco comments:  ?  some days less   ?Vaping Use  ? Vaping Use: Never used  ?Substance and Sexual Activity  ? Alcohol use: Yes  ?  Alcohol/week: 0.0 standard drinks  ?  Comment: pint liquor (gin) daily; 11-06-2018 last two days had a airplane bottle each daily, and on weekend one beer and couple of drink  ? Drug use: Not Currently  ?  Comment: 11-06-2018 per pt last cocaine "when I had my heart attack"  2017  ? Sexual activity: Not  Currently  ?Other Topics Concern  ? Not on file  ?Social History Narrative  ? Not on file  ? ?Social Determinants of Health  ? ?Financial Resource Strain: Not on file  ?Food Insecurity: Not on file  ?Transportation Needs: Not on file  ?Physical Activity: Not on file  ?Stress: Not on file  ?Social Connections: Not on file  ?  ? ?Family History: ?The patient's family history includes Colon polyps in his maternal uncle; Heart attack  in his father and sister. There is no history of Colon cancer, Esophageal cancer, Stomach cancer, or Rectal cancer. ? ?ROS:   ?Please see the history of present illness.    ? All other systems reviewed and are negative. ? ?EKGs/Labs/Other Studies Reviewed:   ? ?The following studies were reviewed today: ? ?Echo 11/11/2020 ? 1. Left ventricular ejection fraction, by estimation, is 40 to 45%. The  ?left ventricle has mildly decreased function. The left ventricle  ?demonstrates global hypokinesis. Left ventricular diastolic parameters are  ?consistent with Grade I diastolic  ?dysfunction (impaired relaxation).  ? 2. Right ventricular systolic function is normal. The right ventricular  ?size is normal.  ? 3. The mitral valve is normal in structure. Mild mitral valve  ?regurgitation. No evidence of mitral stenosis.  ? 4. The aortic valve is tricuspid. Aortic valve regurgitation is not  ?visualized. No aortic stenosis is present.  ? 5. The inferior vena cava is normal in size with greater than 50%  ?respiratory variability, suggesting right atrial pressure of 3 mmHg.  ? ?EKG:  EKG is ordered today.  The ekg ordered today demonstrates sinus bradycardia, heart rate 51 bpm. ? ?Recent Labs: ?04/29/2021: Hemoglobin 16.5; Platelets 190 ?11/19/2021: ALT 11; BUN 13; Creatinine, Ser 0.95; Potassium 4.6; Sodium 143  ?Recent Lipid Panel ?   ?Component Value Date/Time  ? CHOL 160 12/27/2021 1520  ? TRIG 119 12/27/2021 1520  ? HDL 46 12/27/2021 1520  ? CHOLHDL 3.5 12/27/2021 1520  ? CHOLHDL 3.1 03/12/2016 1312  ?  VLDL 8 03/12/2016 1312  ? [place] 93 12/27/2021 1520  ? ? ? ?Risk Assessment/Calculations:   ?  ? ?    ? ?Physical Exam:   ? ?VS:  BP (!) 146/90   Pulse (!) 51   Ht '5\' 6"'$  (1.676 m)   Wt 175 lb 12.8 oz (79

## 2021-12-28 ENCOUNTER — Telehealth: Payer: Self-pay

## 2021-12-28 LAB — LIPID PANEL
Chol/HDL Ratio: 3.5 ratio (ref 0.0–5.0)
Cholesterol, Total: 160 mg/dL (ref 100–199)
HDL: 46 mg/dL (ref >39)
LDL Chol Calc (NIH): 93 mg/dL (ref 0–99)
Triglycerides: 119 mg/dL (ref 0–149)
VLDL Cholesterol Cal: 21 mg/dL (ref 5–40)

## 2021-12-28 NOTE — Telephone Encounter (Addendum)
Left a voice message for patient to give office a call back. ? ?----- Message from Almyra Deforest, [place] sent at 12/28/2021  7:05 AM EDT ----- ?Bad cholesterol (LDL) worsened when compare to 10 month ago. He was previously seen by [name], our clinical pharmacist in the lipid clinic, would recommend another visit with lipid clinic.  ?

## 2021-12-29 ENCOUNTER — Encounter: Payer: Self-pay | Admitting: Physician Assistant

## 2022-01-05 NOTE — Telephone Encounter (Signed)
Left voice message for patient to give office a call for recent lab results  ?

## 2022-03-04 ENCOUNTER — Other Ambulatory Visit: Payer: Self-pay | Admitting: Orthopedic Surgery

## 2022-03-04 DIAGNOSIS — M25521 Pain in right elbow: Secondary | ICD-10-CM

## 2022-03-04 DIAGNOSIS — M19021 Primary osteoarthritis, right elbow: Secondary | ICD-10-CM

## 2022-03-19 ENCOUNTER — Ambulatory Visit
Admission: RE | Admit: 2022-03-19 | Discharge: 2022-03-19 | Disposition: A | Discharge status: Home / Self Care | Payer: Medicaid Other | Source: Ambulatory Visit | Attending: Orthopedic Surgery | Admitting: Orthopedic Surgery

## 2022-03-19 DIAGNOSIS — M25521 Pain in right elbow: Secondary | ICD-10-CM

## 2022-03-19 DIAGNOSIS — M19021 Primary osteoarthritis, right elbow: Secondary | ICD-10-CM

## 2022-03-31 ENCOUNTER — Other Ambulatory Visit: Payer: Self-pay | Admitting: Orthopedic Surgery

## 2022-03-31 DIAGNOSIS — G8929 Other chronic pain: Secondary | ICD-10-CM

## 2022-04-16 ENCOUNTER — Ambulatory Visit
Admission: RE | Admit: 2022-04-16 | Discharge: 2022-04-16 | Disposition: A | Discharge status: Home / Self Care | Payer: Medicaid Other | Source: Ambulatory Visit | Attending: Orthopedic Surgery | Admitting: Orthopedic Surgery

## 2022-04-16 DIAGNOSIS — G8929 Other chronic pain: Secondary | ICD-10-CM

## 2022-05-17 ENCOUNTER — Ambulatory Visit: Payer: Medicaid Other | Attending: Cardiology | Admitting: Pharmacist

## 2022-05-17 ENCOUNTER — Encounter: Payer: Self-pay | Admitting: Pharmacist

## 2022-05-17 DIAGNOSIS — E7800 Pure hypercholesterolemia, unspecified: Secondary | ICD-10-CM

## 2022-05-17 DIAGNOSIS — I2510 Atherosclerotic heart disease of native coronary artery without angina pectoris: Secondary | ICD-10-CM | POA: Diagnosis not present

## 2022-05-17 DIAGNOSIS — Z9861 Coronary angioplasty status: Secondary | ICD-10-CM | POA: Diagnosis not present

## 2022-05-17 DIAGNOSIS — E785 Hyperlipidemia, unspecified: Secondary | ICD-10-CM | POA: Diagnosis not present

## 2022-05-17 MED ORDER — ROSUVASTATIN CALCIUM 40 MG PO TABS: 40.0000 mg | ORAL_TABLET | Freq: Every day | ORAL | 3 refills | Status: DC

## 2022-05-17 MED ORDER — EZETIMIBE 10 MG PO TABS: 10.0000 mg | ORAL_TABLET | Freq: Every day | ORAL | 3 refills | Status: DC

## 2022-05-17 NOTE — Progress Notes (Signed)
Patient ID: Lucas Stark                 DOB: 1956/01/27                    MRN: [id number]     HPI: Lucas Stark is a 66 y.o. male patient referred to lipid clinic by Lucas Stark. PMH is significant for HTN, CAD, NSTEMI, and history or tobacco and alcohol abuse. Patient has been resistant to starting PCSK9i.  Patient presents today in good spirits. Last lipid panel was in April 2023 and LDL above goal at 93 despite high intensity statin plus ezetimibe. Seen by lipid clinic last year in July 2022 and patient was started on Nexletol since he did not want to do self injections.  Current Medications:  Rosuvastatin '40mg'$  Ezetimibe '10mg'$    Intolerances: N/A  Risk Factors:  CAD Hx of NSTEMI HTN Smoking   LDL goal: <55  Labs: TC 160, Trigs 119, HDL 46, LDL 93 (12/27/21 on rosuvastatin '40mg'$  and ezetimibe '10mg'$  daily)  Past Medical History:  Diagnosis Date   CAD (coronary artery disease) cardiologist-- dr Stanford Breed   a. 03/2016 NSTEMI/PCI: LM nl, LAD nl, RI nl, LCX nl, OM2 99 ( 2.75 x 16 Synergy DES), RCA nl, EF 45-50%.   ETOH abuse    GERD (gastroesophageal reflux disease)    History of cocaine abuse ([place])    per pt none since 2017   History of non-ST elevation myocardial infarction (NSTEMI) 03/14/2016   s/p  PCI and DES x1   Hyperlipidemia    Hypertension    Hypertensive heart disease    Ischemic cardiomyopathy    a. 03/2016 Echo: EF 35-40%, diff H, sev inflat HK, Gr2 DD;  b. 03/2016 EF 45-50% by LV gram.;  echo 10-24-2018, ef 45-50%   Myocardial infarction ([place]) 2017   NSTEMI    OA (osteoarthritis)    left knee   S/P drug eluting coronary stent placement 03/14/2016   DES x1 to OM1   Tobacco abuse    Wears dentures    upper   Wears partial dentures    lower    Current Outpatient Medications on File Prior to Visit  Medication Sig Dispense Refill   acetaminophen (TYLENOL) 500 MG tablet Take 1,000 mg by mouth every 6 (six) hours as needed for moderate pain or headache.      aspirin EC 81 MG tablet Take 81 mg by mouth daily. Swallow whole.     carvedilol (COREG) 6.25 MG tablet TAKE 1 TABLET BY MOUTH 2 TIMES DAILY (Patient taking differently: Take 6.25 mg by mouth 2 (two) times daily with a meal.) 180 tablet 3   chlorthalidone (HYGROTON) 25 MG tablet TAKE 1/2 TABLET BY MOUTH EVERY DAY 45 tablet 3   D3 SUPER STRENGTH 2000 units CAPS Take 2,000 Units by mouth daily.  0   ezetimibe (ZETIA) 10 MG tablet Take 1 tablet (10 mg total) by mouth daily. 90 tablet 3   losartan (COZAAR) 100 MG tablet Take 1 tablet (100 mg total) by mouth daily. 90 tablet 3   nitroGLYCERIN (NITROSTAT) 0.4 MG SL tablet Place 1 tablet (0.4 mg total) under the tongue every 5 (five) minutes as needed for chest pain. 25 tablet 3   rosuvastatin (CRESTOR) 40 MG tablet Take 1 tablet (40 mg total) by mouth daily. 90 tablet 3   Current Facility-Administered Medications on File Prior to Visit  Medication Dose Route Frequency Provider Last Rate Last Admin  nitroGLYCERIN (NITROSTAT) SL tablet 0.4 mg  0.4 mg Sublingual Q5 min PRN Cherlynn Kaiser A, MD   0.4 mg at 09/30/20 1623    Allergies  Allergen Reactions   Advil [Ibuprofen]     " I cant take, I had a stent put in, I have to take tylenol"   Other Itching    Prescription Sleeping pills, Unsure of the name of the sleeping pill   Oxycodone     Nervousness, sweaty, itchy    Assessment/Plan:  1. Hyperlipidemia - Patient's most recent LDL 93 which is above goal of <55 despite high intensity statin and ezetimibe. Patient reports compliance.  Explained again that the recommended next step was Repatha/Praluent or Nexletol. Patient remains resistant to injections however he would like to update lipid panel before deciding on any new therapies.  Walked patient to lab and will contact with results.  Continue rosuvastatin '40mg'$  daily Continue ezetimibe '10mg'$  daily Check lipid panel today  [name], PharmD, BCACP, [place], [place], [place], [place], [address] Phone: [phone], Fax: [phone]

## 2022-05-18 LAB — LIPID PANEL
Chol/HDL Ratio: 3.7 ratio (ref 0.0–5.0)
Cholesterol, Total: 223 mg/dL — ABNORMAL HIGH (ref 100–199)
HDL: 61 mg/dL (ref >39)
LDL Chol Calc (NIH): 111 mg/dL — ABNORMAL HIGH (ref 0–99)
Triglycerides: 299 mg/dL — ABNORMAL HIGH (ref 0–149)
VLDL Cholesterol Cal: 51 mg/dL — ABNORMAL HIGH (ref 5–40)

## 2022-06-01 ENCOUNTER — Telehealth: Payer: Self-pay

## 2022-06-01 DIAGNOSIS — E7800 Pure hypercholesterolemia, unspecified: Secondary | ICD-10-CM

## 2022-06-01 NOTE — Telephone Encounter (Signed)
Spoke with pt. Pt was notified of lab results and recommendations. Referral sent to pharmD to discuss medication options.

## 2022-06-30 ENCOUNTER — Telehealth: Payer: Self-pay | Admitting: Pharmacist

## 2022-06-30 ENCOUNTER — Encounter: Payer: Self-pay | Admitting: Pharmacist

## 2022-06-30 ENCOUNTER — Ambulatory Visit: Payer: Medicaid Other | Attending: Cardiology | Admitting: Pharmacist

## 2022-06-30 VITALS — BP 138/90

## 2022-06-30 DIAGNOSIS — I2510 Atherosclerotic heart disease of native coronary artery without angina pectoris: Secondary | ICD-10-CM

## 2022-06-30 DIAGNOSIS — E785 Hyperlipidemia, unspecified: Secondary | ICD-10-CM

## 2022-06-30 DIAGNOSIS — I252 Old myocardial infarction: Secondary | ICD-10-CM

## 2022-06-30 DIAGNOSIS — Z9861 Coronary angioplasty status: Secondary | ICD-10-CM

## 2022-06-30 MED ORDER — NEXLETOL 180 MG PO TABS: 1.0000 | ORAL_TABLET | Freq: Every day | ORAL | 0 refills | Status: DC

## 2022-06-30 NOTE — Patient Instructions (Addendum)
It was good seeing you again  We would like your LDL (bad cholesterol) to be less than 55  Please continue your rosuvastatin '40mg'$  once a day.  You can try taking it in the morning to see if that helps  Please continue your ezetimibe '10mg'$  daily  We would like to start a new medication called Nexletol which is one pill once a day  I will complete the prior authorization for you  We will recheck your cholesterol in December  [name], PharmD, [place], [place], [place] [address] [place], [place], [address] Phone: [phone], Fax: [phone]

## 2022-06-30 NOTE — Telephone Encounter (Signed)
PA for Nexletol submitted. Key: [facility]

## 2022-06-30 NOTE — Progress Notes (Signed)
Patient ID: Lucas Stark                 DOB: May 30, 1956                    MRN: [id number]      HPI: Lucas Stark is a 66 y.o. male patient referred to lipid clinic by Almyra Deforest. PMH is significant for HTN, CAD, NSTEMI, and history or tobacco and alcohol abuse. Patient has been resistant to starting PCSK9i.  Patient presents today in good spirits. Last lipid panel was in April 2023 and LDL above goal at 93 despite high intensity statin plus ezetimibe. Seen by lipid clinic last year in July 2022 and patient was started on Nexletol since he did not want to do self injections.  At last visit patient wanted to update lipid panel before starting any new medications. LDL increased to 111.  Patient presents today still hesistant to start injections, however he was under the impression they are daily injections.  Reports compliance with rosuvastatin and ezetimibe however he sweats at night and thinks it is due to rosuvastatin.  Conitnues to smoke nearly 1ppd. Has nicotine patches and "pills" at home but has not used yet.  Current Medications:  Rosuvastatin '40mg'$  Ezetimibe '10mg'$    Intolerances: N/A  Risk Factors:  CAD Hx of NSTEMI HTN Smoking   LDL goal: <55  Labs: TC 223, Trigs 299, HDL 61, LDL 111 (05/17/22 on rosuvastatin '40mg'$  and ezetimibe '10mg'$  daily)  Past Medical History:  Diagnosis Date   CAD (coronary artery disease) cardiologist-- dr Stanford Breed   a. 03/2016 NSTEMI/PCI: LM nl, LAD nl, RI nl, LCX nl, OM2 99 ( 2.75 x 16 Synergy DES), RCA nl, EF 45-50%.   ETOH abuse    GERD (gastroesophageal reflux disease)    History of cocaine abuse ([place])    per pt none since 2017   History of non-ST elevation myocardial infarction (NSTEMI) 03/14/2016   s/p  PCI and DES x1   Hyperlipidemia    Hypertension    Hypertensive heart disease    Ischemic cardiomyopathy    a. 03/2016 Echo: EF 35-40%, diff H, sev inflat HK, Gr2 DD;  b. 03/2016 EF 45-50% by LV gram.;  echo 10-24-2018, ef 45-50%    Myocardial infarction ([place]) 2017   NSTEMI    OA (osteoarthritis)    left knee   S/P drug eluting coronary stent placement 03/14/2016   DES x1 to OM1   Tobacco abuse    Wears dentures    upper   Wears partial dentures    lower    Current Outpatient Medications on File Prior to Visit  Medication Sig Dispense Refill   acetaminophen (TYLENOL) 500 MG tablet Take 1,000 mg by mouth every 6 (six) hours as needed for moderate pain or headache.     aspirin EC 81 MG tablet Take 81 mg by mouth daily. Swallow whole.     carvedilol (COREG) 6.25 MG tablet TAKE 1 TABLET BY MOUTH 2 TIMES DAILY (Patient taking differently: Take 6.25 mg by mouth 2 (two) times daily with a meal.) 180 tablet 3   chlorthalidone (HYGROTON) 25 MG tablet TAKE 1/2 TABLET BY MOUTH EVERY DAY 45 tablet 3   D3 SUPER STRENGTH 2000 units CAPS Take 2,000 Units by mouth daily.  0   ezetimibe (ZETIA) 10 MG tablet Take 1 tablet (10 mg total) by mouth daily. 90 tablet 3   losartan (COZAAR) 100 MG tablet Take 1 tablet (100  mg total) by mouth daily. 90 tablet 3   nitroGLYCERIN (NITROSTAT) 0.4 MG SL tablet Place 1 tablet (0.4 mg total) under the tongue every 5 (five) minutes as needed for chest pain. 25 tablet 3   rosuvastatin (CRESTOR) 40 MG tablet Take 1 tablet (40 mg total) by mouth daily. 90 tablet 3   Current Facility-Administered Medications on File Prior to Visit  Medication Dose Route Frequency Provider Last Rate Last Admin   nitroGLYCERIN (NITROSTAT) SL tablet 0.4 mg  0.4 mg Sublingual Q5 min PRN Elouise Munroe, MD   0.4 mg at 09/30/20 1623    Allergies  Allergen Reactions   Advil [Ibuprofen]     " I cant take, I had a stent put in, I have to take tylenol"   Other Itching    Prescription Sleeping pills, Unsure of the name of the sleeping pill   Oxycodone     Nervousness, sweaty, itchy    Assessment/Plan:  1. Hyperlipidemia - Patient's most recent LDL 111 which is above goal of <55 despite high intensity statin and  ezetimibe and has increasde since past lipid panel. Patient reports compliance.  Explained again that the recommended next step was Repatha/Praluent or Nexletol.  Using demo pen, explained to patient that Repatha/Praluent were administered once every 2 weeks, not daily.  Demonstrated how to inject on outer thigh or abdomen.  Patient remains resistant to injections however he is considering it now after demonstration.  Would prefer to try Nexletol first however. Will complete PA and contact patient when approved. Gave 2 sample boxes.  Recheck lipid panel in 2-3 months.  If LDL does not decrease, patient willing to reconsider PCSK9i.  Continue rosuvastatin '40mg'$  daily Continue ezetimibe '10mg'$  daily Check lipid panel today  [name], PharmD, BCACP, [place], [place], [place], [place], [address] Phone: [phone], Fax: [phone]

## 2022-07-01 MED ORDER — NEXLETOL 180 MG PO TABS: 1.0000 | ORAL_TABLET | Freq: Every day | ORAL | 3 refills | Status: DC

## 2022-07-01 NOTE — Addendum Note (Signed)
Addended by: Rollen Sox on: 07/01/2022 08:04 AM   Modules accepted: Orders

## 2022-07-01 NOTE — Telephone Encounter (Signed)
Nexletol PA approved through 07/01/23

## 2022-08-08 ENCOUNTER — Other Ambulatory Visit: Payer: Self-pay | Admitting: Cardiology

## 2022-09-20 ENCOUNTER — Encounter: Payer: Self-pay | Admitting: Student

## 2022-09-20 ENCOUNTER — Other Ambulatory Visit (HOSPITAL_COMMUNITY)
Admission: RE | Admit: 2022-09-20 | Discharge: 2022-09-20 | Disposition: A | Discharge status: Home / Self Care | Payer: Medicare Other | Source: Ambulatory Visit | Attending: Internal Medicine | Admitting: Internal Medicine

## 2022-09-20 ENCOUNTER — Ambulatory Visit (INDEPENDENT_AMBULATORY_CARE_PROVIDER_SITE_OTHER): Payer: Medicare Other | Admitting: Student

## 2022-09-20 VITALS — BP 131/81 | HR 61 | Temp 98.3°F | Ht 65.5 in | Wt 184.0 lb

## 2022-09-20 DIAGNOSIS — Z7251 High risk heterosexual behavior: Secondary | ICD-10-CM

## 2022-09-20 DIAGNOSIS — I2510 Atherosclerotic heart disease of native coronary artery without angina pectoris: Secondary | ICD-10-CM

## 2022-09-20 DIAGNOSIS — I5022 Chronic systolic (congestive) heart failure: Secondary | ICD-10-CM

## 2022-09-20 DIAGNOSIS — F1490 Cocaine use, unspecified, uncomplicated: Secondary | ICD-10-CM

## 2022-09-20 DIAGNOSIS — I10 Essential (primary) hypertension: Secondary | ICD-10-CM

## 2022-09-20 DIAGNOSIS — E785 Hyperlipidemia, unspecified: Secondary | ICD-10-CM | POA: Diagnosis not present

## 2022-09-20 DIAGNOSIS — F17210 Nicotine dependence, cigarettes, uncomplicated: Secondary | ICD-10-CM

## 2022-09-20 DIAGNOSIS — I110 Hypertensive heart disease with heart failure: Secondary | ICD-10-CM | POA: Diagnosis present

## 2022-09-20 DIAGNOSIS — Z114 Encounter for screening for human immunodeficiency virus [HIV]: Secondary | ICD-10-CM

## 2022-09-20 DIAGNOSIS — Z720 Tobacco use: Secondary | ICD-10-CM

## 2022-09-20 DIAGNOSIS — E7800 Pure hypercholesterolemia, unspecified: Secondary | ICD-10-CM

## 2022-09-20 DIAGNOSIS — Z9861 Coronary angioplasty status: Secondary | ICD-10-CM | POA: Diagnosis not present

## 2022-09-20 MED ORDER — CARVEDILOL 6.25 MG PO TABS: 6.2500 mg | ORAL_TABLET | Freq: Two times a day (BID) | ORAL | 3 refills | Status: DC

## 2022-09-20 MED ORDER — ROSUVASTATIN CALCIUM 40 MG PO TABS: 40.0000 mg | ORAL_TABLET | Freq: Every day | ORAL | 3 refills | Status: DC

## 2022-09-20 MED ORDER — EZETIMIBE 10 MG PO TABS: 10.0000 mg | ORAL_TABLET | Freq: Every day | ORAL | 3 refills | Status: DC

## 2022-09-20 MED ORDER — LOSARTAN POTASSIUM 100 MG PO TABS: 100.0000 mg | ORAL_TABLET | Freq: Every day | ORAL | 3 refills | Status: DC

## 2022-09-20 MED ORDER — CHLORTHALIDONE 25 MG PO TABS: 12.5000 mg | ORAL_TABLET | Freq: Every day | ORAL | 3 refills | Status: DC

## 2022-09-20 NOTE — Assessment & Plan Note (Signed)
Patient with about 35 pack-year history of tobacco use. States he is not interested in quitting at this time. Has cut down on his own to about 10 cigarettes a day.

## 2022-09-20 NOTE — Assessment & Plan Note (Signed)
Hx of CAD s/p PCI (OM1 drug-eluting stent placed in 03/2016). He is on chronic aspirin therapy along with crestor, zetia, and bempedoic acid for LDL-lowering therapies.  -continue current management

## 2022-09-20 NOTE — Assessment & Plan Note (Signed)
Continue crestor, zetia, and bempedoic acid.

## 2022-09-20 NOTE — Patient Instructions (Signed)
Lucas Stark,  It was a pleasure seeing you in the clinic today.   I have ordered the testing that you requested. Once I have the results, I will give you a call to go over them and prescribe any needed medications at that time.  Please call our clinic at [phone] if you have any questions or concerns. The best time to call is Monday-Friday from 9am-4pm, but there is someone available 24/7 at the same number. If you need medication refills, please notify your pharmacy one week in advance and they will send [place] a request.   Thank you for letting us take part in your care. We look forward to seeing you next time!

## 2022-09-20 NOTE — Progress Notes (Signed)
CC: establish PCP, high risk heterosexual encounter  HPI:  Lucas Stark is a 67 y.o. male with history listed below presenting to the [facility] to establish PCP. Please see individualized problem based charting for full HPI.  Past Medical History:  Diagnosis Date   Appendicitis 04/29/2021   CAD (coronary artery disease) cardiologist-- dr Stanford Breed   a. 03/2016 NSTEMI/PCI: LM nl, LAD nl, RI nl, LCX nl, OM2 99 ( 2.75 x 16 Synergy DES), RCA nl, EF 45-50%.   ETOH abuse    GERD (gastroesophageal reflux disease)    History of cocaine abuse ([place])    per pt none since 2017   History of non-ST elevation myocardial infarction (NSTEMI) 03/14/2016   s/p  PCI and DES x1   Hyperlipidemia    Hypertension    Hypertensive heart disease    Ischemic cardiomyopathy    a. 03/2016 Echo: EF 35-40%, diff H, sev inflat HK, Gr2 DD;  b. 03/2016 EF 45-50% by LV gram.;  echo 10-24-2018, ef 45-50%   Myocardial infarction ([place]) 2017   NSTEMI    OA (osteoarthritis)    left knee   S/P drug eluting coronary stent placement 03/14/2016   DES x1 to OM1   Tobacco abuse    Wears dentures    upper   Wears partial dentures    lower   Current Outpatient Medications on File Prior to Visit  Medication Sig Dispense Refill   acetaminophen (TYLENOL) 500 MG tablet Take 1,000 mg by mouth every 6 (six) hours as needed for moderate pain or headache.     aspirin EC 81 MG tablet Take 81 mg by mouth daily. Swallow whole.     Bempedoic Acid (NEXLETOL) 180 MG TABS Take 1 tablet by mouth daily. 90 tablet 3   carvedilol (COREG) 6.25 MG tablet TAKE 1 TABLET BY MOUTH 2 TIMES DAILY (Patient taking differently: Take 6.25 mg by mouth 2 (two) times daily with a meal.) 180 tablet 3   chlorthalidone (HYGROTON) 25 MG tablet TAKE 1/2 TABLET BY MOUTH EVERY DAY 45 tablet 3   D3 SUPER STRENGTH 2000 units CAPS Take 2,000 Units by mouth daily.  0   ezetimibe (ZETIA) 10 MG tablet Take 1 tablet (10 mg total) by mouth daily. 90 tablet 3   losartan  (COZAAR) 100 MG tablet Take 1 tablet (100 mg total) by mouth daily. 90 tablet 3   nitroGLYCERIN (NITROSTAT) 0.4 MG SL tablet Place 1 tablet (0.4 mg total) under the tongue every 5 (five) minutes as needed for chest pain. 25 tablet 3   pantoprazole (PROTONIX) 20 MG tablet TAKE 1 TABLET BY MOUTH EVERY DAY 30 tablet 3   rosuvastatin (CRESTOR) 40 MG tablet Take 1 tablet (40 mg total) by mouth daily. 90 tablet 3   sildenafil (REVATIO) 20 MG tablet SMARTSIG:1-3 Tablet(s) By Mouth     No current facility-administered medications on file prior to visit.    Allergies  Allergen Reactions   Advil [Ibuprofen]     " I cant take, I had a stent put in, I have to take tylenol"   Other Itching    Prescription Sleeping pills, Unsure of the name of the sleeping pill   Oxycodone     Nervousness, sweaty, itchy    Family History  Problem Relation Age of Onset   Heart attack Father    Heart attack Sister    Colon polyps Maternal Uncle    Colon cancer Neg Hx    Esophageal cancer Neg Hx  Stomach cancer Neg Hx    Rectal cancer Neg Hx     Social History   Socioeconomic History   Marital status: Legally Separated    Spouse name: Not on file   Number of children: 8   Years of education: Not on file   Highest education level: Not on file  Occupational History   Not on file  Tobacco Use   Smoking status: Some Days    Packs/day: 0.50    Years: 35.00    Total pack years: 17.50    Types: Cigarettes   Smokeless tobacco: Never   Tobacco comments:    some days less   Vaping Use   Vaping Use: Never used  Substance and Sexual Activity   Alcohol use: Yes    Alcohol/week: 0.0 standard drinks of alcohol    Comment: pint liquor (gin) daily; 11-06-2018 last two days had a airplane bottle each daily, and on weekend one beer and couple of drink   Drug use: Not Currently    Comment: 11-06-2018 per pt last cocaine "when I had my heart attack"  2017   Sexual activity: Not Currently  Other Topics Concern    Not on file  Social History Narrative   Not on file   Social Determinants of Health   Financial Resource Strain: Not on file  Food Insecurity: Not on file  Transportation Needs: Not on file  Physical Activity: Not on file  Stress: Not on file  Social Connections: Not on file  Intimate Partner Violence: Not on file     Review of Systems:  Negative aside from that listed in individualized problem based charting.  Physical Exam:  Vitals:   09/20/22 1351  BP: 131/81  Pulse: 61  Temp: 98.3 F (36.8 C)  TempSrc: Oral  SpO2: 97%  Weight: 184 lb (83.5 kg)  Height: 5' 5.5" (1.664 m)   Physical Exam Constitutional:      Appearance: He is obese. He is not ill-appearing.  HENT:     Head: Normocephalic and atraumatic.     Mouth/Throat:     Mouth: Mucous membranes are moist.     Pharynx: Oropharynx is clear.  Eyes:     Extraocular Movements: Extraocular movements intact.     Pupils: Pupils are equal, round, and reactive to light.  Cardiovascular:     Rate and Rhythm: Normal rate and regular rhythm.     Heart sounds: Normal heart sounds. No murmur heard.    No gallop.  Pulmonary:     Effort: Pulmonary effort is normal.     Breath sounds: Normal breath sounds. No wheezing, rhonchi or rales.  Abdominal:     General: Abdomen is flat. Bowel sounds are normal.     Palpations: Abdomen is soft.     Tenderness: There is no abdominal tenderness.  Genitourinary:    Penis: Normal.      Testes: Normal.        Right: Mass, tenderness or swelling not present.        Left: Mass, tenderness or swelling not present.     Comments: Skin tag on medial upper left thigh Musculoskeletal:     Right lower leg: No edema.     Left lower leg: No edema.  Skin:    General: Skin is warm and dry.  Neurological:     General: No focal deficit present.     Mental Status: He is alert and oriented to person, place, and time.  Psychiatric:  Mood and Affect: Mood normal.        Behavior:  Behavior normal.      Assessment & Plan:   See Encounters Tab for problem based charting.  Patient discussed with Dr. Heber Adjuntas

## 2022-09-20 NOTE — Assessment & Plan Note (Signed)
Patient with HFmEF, last ECHO in 2022 with EF 40-45%, mildly decreased LV function, LV global hypokinesis, and G1DD. He is on coreg and losartan currently. He is currently asymptomatic with no peripheral edema on exam.   -continue coreg, losartan

## 2022-09-20 NOTE — Assessment & Plan Note (Signed)
He denies cocaine use at this time. States last use was over a year ago.

## 2022-09-20 NOTE — Assessment & Plan Note (Signed)
Patient presenting to establish care. His main complaint for today is need for STI testing after a high-risk sexual encounter. He states that he had an unprotected heterosexual sexual encounter last week and wants to get "tested for everything." He denies any urinary discharge, itching, urinary changes. Just wants to make sure he has a "clean bill of health." Given high risk encounter, it is reasonable to proceed with testing.  Plan: -f/u urine ancillary testing (gonorrhea, chlamydia, trichomonas, BV, candida) -f/u HIV and Hep C testing

## 2022-09-20 NOTE — Assessment & Plan Note (Signed)
BP at goal on losartan and coreg. Refilled both today.

## 2022-09-21 LAB — HIV ANTIBODY (ROUTINE TESTING W REFLEX): HIV Screen 4th Generation wRfx: NONREACTIVE

## 2022-09-21 LAB — HCV AB W REFLEX TO QUANT PCR: HCV Ab: NONREACTIVE

## 2022-09-21 LAB — HCV INTERPRETATION

## 2022-09-22 LAB — URINE CYTOLOGY ANCILLARY ONLY
Bacterial Vaginitis-Urine: NEGATIVE
Candida Urine: NEGATIVE
Chlamydia: NEGATIVE
Comment: NEGATIVE
Comment: NEGATIVE
Comment: NORMAL
Neisseria Gonorrhea: NEGATIVE
Trichomonas: NEGATIVE

## 2022-09-22 NOTE — Progress Notes (Signed)
Internal Medicine Clinic Attending  Case discussed with the resident at the time of the visit.  We reviewed the resident's history and exam and pertinent patient test results.  I agree with the assessment, diagnosis, and plan of care documented in the resident's note.  

## 2022-09-30 ENCOUNTER — Telehealth: Payer: Self-pay | Admitting: Cardiology

## 2022-09-30 DIAGNOSIS — I2510 Atherosclerotic heart disease of native coronary artery without angina pectoris: Secondary | ICD-10-CM

## 2022-09-30 DIAGNOSIS — I252 Old myocardial infarction: Secondary | ICD-10-CM

## 2022-09-30 DIAGNOSIS — E785 Hyperlipidemia, unspecified: Secondary | ICD-10-CM

## 2022-09-30 MED ORDER — PANTOPRAZOLE SODIUM 20 MG PO TBEC: 20.0000 mg | DELAYED_RELEASE_TABLET | Freq: Every day | ORAL | 3 refills | Status: DC

## 2022-09-30 MED ORDER — NEXLETOL 180 MG PO TABS: 1.0000 | ORAL_TABLET | Freq: Every day | ORAL | 3 refills | Status: DC

## 2022-09-30 NOTE — Telephone Encounter (Signed)
*  STAT* If patient is at the pharmacy, call can be transferred to refill team.   1. Which medications need to be refilled? (please list name of each medication and dose if known)   pantoprazole (PROTONIX) 20 MG tablet   Bempedoic Acid (NEXLETOL) 180 MG TABS    2. Which pharmacy/location (including street and city if local pharmacy) is medication to be sent to?   [facility]   3. Do they need a 30 day or 90 day supply? [place]

## 2022-10-05 ENCOUNTER — Other Ambulatory Visit: Payer: Self-pay

## 2022-10-05 DIAGNOSIS — E785 Hyperlipidemia, unspecified: Secondary | ICD-10-CM

## 2022-10-05 DIAGNOSIS — I2510 Atherosclerotic heart disease of native coronary artery without angina pectoris: Secondary | ICD-10-CM

## 2022-10-05 DIAGNOSIS — I10 Essential (primary) hypertension: Secondary | ICD-10-CM

## 2022-10-05 DIAGNOSIS — I252 Old myocardial infarction: Secondary | ICD-10-CM

## 2022-10-05 DIAGNOSIS — E7800 Pure hypercholesterolemia, unspecified: Secondary | ICD-10-CM

## 2022-10-05 MED ORDER — LOSARTAN POTASSIUM 100 MG PO TABS: 100.0000 mg | ORAL_TABLET | Freq: Every day | ORAL | 3 refills | Status: DC

## 2022-10-05 MED ORDER — NEXLETOL 180 MG PO TABS: 1.0000 | ORAL_TABLET | Freq: Every day | ORAL | 0 refills | Status: DC

## 2022-10-05 MED ORDER — CARVEDILOL 6.25 MG PO TABS: 6.2500 mg | ORAL_TABLET | Freq: Two times a day (BID) | ORAL | 3 refills | Status: DC

## 2022-10-05 MED ORDER — PANTOPRAZOLE SODIUM 20 MG PO TBEC: 20.0000 mg | DELAYED_RELEASE_TABLET | Freq: Every day | ORAL | 0 refills | Status: DC

## 2022-10-05 MED ORDER — CHLORTHALIDONE 25 MG PO TABS: 12.5000 mg | ORAL_TABLET | Freq: Every day | ORAL | 3 refills | Status: DC

## 2022-10-05 MED ORDER — EZETIMIBE 10 MG PO TABS: 10.0000 mg | ORAL_TABLET | Freq: Every day | ORAL | 3 refills | Status: DC

## 2022-10-05 NOTE — Telephone Encounter (Signed)
[facility] pharmacy calling to follow up on request.

## 2022-10-05 NOTE — Telephone Encounter (Signed)
RX sent to pharmacy. Thanks

## 2022-11-22 ENCOUNTER — Ambulatory Visit (INDEPENDENT_AMBULATORY_CARE_PROVIDER_SITE_OTHER): Payer: 59

## 2022-11-22 ENCOUNTER — Ambulatory Visit (INDEPENDENT_AMBULATORY_CARE_PROVIDER_SITE_OTHER): Payer: 59 | Admitting: Student

## 2022-11-22 ENCOUNTER — Encounter: Payer: Self-pay | Admitting: Student

## 2022-11-22 ENCOUNTER — Other Ambulatory Visit: Payer: Self-pay

## 2022-11-22 VITALS — BP 148/89 | HR 86 | Temp 98.4°F | Resp 24 | Ht 65.5 in | Wt 180.3 lb

## 2022-11-22 DIAGNOSIS — I5022 Chronic systolic (congestive) heart failure: Secondary | ICD-10-CM | POA: Diagnosis not present

## 2022-11-22 DIAGNOSIS — Z0000 Encounter for general adult medical examination without abnormal findings: Secondary | ICD-10-CM | POA: Insufficient documentation

## 2022-11-22 DIAGNOSIS — R062 Wheezing: Secondary | ICD-10-CM | POA: Diagnosis not present

## 2022-11-22 DIAGNOSIS — Z7251 High risk heterosexual behavior: Secondary | ICD-10-CM

## 2022-11-22 DIAGNOSIS — Z720 Tobacco use: Secondary | ICD-10-CM

## 2022-11-22 DIAGNOSIS — I110 Hypertensive heart disease with heart failure: Secondary | ICD-10-CM | POA: Diagnosis not present

## 2022-11-22 DIAGNOSIS — F17210 Nicotine dependence, cigarettes, uncomplicated: Secondary | ICD-10-CM | POA: Diagnosis not present

## 2022-11-22 DIAGNOSIS — Z23 Encounter for immunization: Secondary | ICD-10-CM | POA: Diagnosis not present

## 2022-11-22 DIAGNOSIS — I10 Essential (primary) hypertension: Secondary | ICD-10-CM

## 2022-11-22 DIAGNOSIS — J449 Chronic obstructive pulmonary disease, unspecified: Secondary | ICD-10-CM | POA: Insufficient documentation

## 2022-11-22 MED ORDER — ALBUTEROL SULFATE HFA 108 (90 BASE) MCG/ACT IN AERS: 2.0000 | INHALATION_SPRAY | Freq: Four times a day (QID) | RESPIRATORY_TRACT | 2 refills | Status: DC | PRN

## 2022-11-22 MED ORDER — ZOSTER VAC RECOMB ADJUVANTED 50 MCG/0.5ML IM SUSR: 0.5000 mL | Freq: Once | INTRAMUSCULAR | 0 refills | Status: AC

## 2022-11-22 NOTE — Assessment & Plan Note (Signed)
BP elevated at visit today. 150/92 with repeat 148/89. He brought in his home antihypertensives and states that he has not taken these over the past 5-7 days as he was confused in regards to which ones to take and at what times to take them. He brought in his pill pack and we discussed medication regimen in detail. He is coreg 6.'25mg'$  BID, chlorthalidone 12.'5mg'$  daily, and losartan '100mg'$  daily. In the past, his BP has been relatively well controlled with this regimen. I suspect his current elevations are due to medication nonadherence. Counseled him on strict adherence with regimen. He understands and now that he knows how to take them, will restart them daily.   -continue coreg, losartan, chlorthalidone -f/u in 1 month for repeat BP

## 2022-11-22 NOTE — Assessment & Plan Note (Signed)
Asymptomatic and euvolemic on exam. Continue current GDMT of coreg and losartan.

## 2022-11-22 NOTE — Assessment & Plan Note (Signed)
HIV, Hep C, and urine ancillary testing done on previous visit all came back negative.

## 2022-11-22 NOTE — Assessment & Plan Note (Signed)
Received Tdap and PCV-20 vaccines today. Provided script for shingrix vaccine as well.

## 2022-11-22 NOTE — Progress Notes (Signed)
Subjective:   Lucas Stark is a 67 y.o. male who presents for an Initial Medicare Annual Wellness Visit.  Review of Systems    Defer to PCP.        Objective:    Today's Vitals   11/22/22 1710 11/22/22 1711  BP: (!) 150/92 (!) 148/89  Pulse: 89 86  Resp: (!) 24   Temp: 98.4 F (36.9 C)   TempSrc: Oral   SpO2: 97%   Weight: 180 lb 4.8 oz (81.8 kg)   Height: 5' 5.5" (1.664 m)    Body mass index is 29.55 kg/m.     11/22/2022    5:17 PM 11/22/2022    2:33 PM 09/20/2022    2:01 PM 04/29/2021   10:46 AM 11/16/2020   10:26 PM 10/01/2020    7:17 AM 11/16/2018    9:37 AM  Advanced Directives  Does Patient Have a Medical Advance Directive? No No No No No No   Would patient like information on creating a medical advance directive? Yes (MAU/Ambulatory/Procedural Areas - Information given) Yes (MAU/Ambulatory/Procedural Areas - Information given) Yes (MAU/Ambulatory/Procedural Areas - Information given) No - Patient declined No - Patient declined No - Patient declined No - Patient declined    Current Medications (verified) Outpatient Encounter Medications as of 11/22/2022  Medication Sig   acetaminophen (TYLENOL) 500 MG tablet Take 1,000 mg by mouth every 6 (six) hours as needed for moderate pain or headache.   albuterol (VENTOLIN HFA) 108 (90 Base) MCG/ACT inhaler Inhale 2 puffs into the lungs every 6 (six) hours as needed for wheezing or shortness of breath.   aspirin EC 81 MG tablet Take 81 mg by mouth daily. Swallow whole.   Bempedoic Acid (NEXLETOL) 180 MG TABS Take 1 tablet (180 mg total) by mouth daily.   carvedilol (COREG) 6.25 MG tablet Take 1 tablet (6.25 mg total) by mouth 2 (two) times daily.   chlorthalidone (HYGROTON) 25 MG tablet Take 0.5 tablets (12.5 mg total) by mouth daily.   ezetimibe (ZETIA) 10 MG tablet Take 1 tablet (10 mg total) by mouth daily.   losartan (COZAAR) 100 MG tablet Take 1 tablet (100 mg total) by mouth daily.   nitroGLYCERIN (NITROSTAT) 0.4 MG SL  tablet Place 1 tablet (0.4 mg total) under the tongue every 5 (five) minutes as needed for chest pain.   pantoprazole (PROTONIX) 20 MG tablet Take 1 tablet (20 mg total) by mouth daily.   rosuvastatin (CRESTOR) 40 MG tablet Take 1 tablet (40 mg total) by mouth daily.   sildenafil (REVATIO) 20 MG tablet SMARTSIG:1-3 Tablet(s) By Mouth   Zoster Vaccine Adjuvanted [facility]) injection Inject 0.5 mLs into the muscle once for 1 dose.   No facility-administered encounter medications on file as of 11/22/2022.    Allergies (verified) Advil [ibuprofen], Other, and Oxycodone   History: Past Medical History:  Diagnosis Date   Alcohol abuse 03/12/2016   Appendicitis 04/29/2021   CAD (coronary artery disease) cardiologist-- dr Stanford Breed   a. 03/2016 NSTEMI/PCI: LM nl, LAD nl, RI nl, LCX nl, OM2 99 ( 2.75 x 16 Synergy DES), RCA nl, EF 45-50%.   ETOH abuse    GERD (gastroesophageal reflux disease)    History of cocaine abuse ([place])    per pt none since 2017   History of non-ST elevation myocardial infarction (NSTEMI) 03/14/2016   s/p  PCI and DES x1   Hyperlipidemia    Hypertension    Hypertensive heart disease    Ischemic cardiomyopathy  a. 03/2016 Echo: EF 35-40%, diff H, sev inflat HK, Gr2 DD;  b. 03/2016 EF 45-50% by LV gram.;  echo 10-24-2018, ef 45-50%   Knee effusion, left 08/15/2018   Myocardial infarction [facility]) 2017   NSTEMI    OA (osteoarthritis)    left knee   S/P drug eluting coronary stent placement 03/14/2016   DES x1 to OM1   Tobacco abuse    Wears dentures    upper   Wears partial dentures    lower   Past Surgical History:  Procedure Laterality Date   CARDIAC CATHETERIZATION N/A 03/14/2016   Procedure: Left Heart Cath and Coronary Angiography;  Surgeon: Burnell Blanks, MD;  Location: [place] [facility];  Service: Cardiovascular;  Laterality: N/A;   CORONARY ANGIOPLASTY WITH STENT PLACEMENT     FACIAL COSMETIC SURGERY     at age 55 yrs old   [place]  age 63    face around eye   LAPAROSCOPIC APPENDECTOMY N/A 04/29/2021   Procedure: APPENDECTOMY LAPAROSCOPIC;  Surgeon: Ralene Ok, MD;  Location: [place];  Service: General;  Laterality: N/A;   PARTIAL KNEE ARTHROPLASTY Left 11/13/2018   Procedure: UNICOMPARTMENTAL KNEE;  Surgeon: Renette Butters, MD;  Location: [facility];  Service: Orthopedics;  Laterality: Left;   WRIST SURGERY Left 01/31/2020   Family History  Problem Relation Age of Onset   Heart attack Father    Heart attack Sister    Colon polyps Maternal Uncle    Colon cancer Neg Hx    Esophageal cancer Neg Hx    Stomach cancer Neg Hx    Rectal cancer Neg Hx    Social History   Socioeconomic History   Marital status: Legally Separated    Spouse name: Not on file   Number of children: 8   Years of education: Not on file   Highest education level: Not on file  Occupational History   Not on file  Tobacco Use   Smoking status: Some Days    Packs/day: 0.50    Years: 35.00    Total pack years: 17.50    Types: Cigarettes   Smokeless tobacco: Never   Tobacco comments:    some days less  10 per day   Vaping Use   Vaping Use: Never used  Substance and Sexual Activity   Alcohol use: Yes    Alcohol/week: 0.0 standard drinks of alcohol    Comment: pint liquor (gin) daily; 11-06-2018 last two days had a airplane bottle each daily, and on weekend one beer and couple of drink   Drug use: Not Currently    Comment: 11-06-2018 per pt last cocaine "when I had my heart attack"  2017   Sexual activity: Not Currently  Other Topics Concern   Not on file  Social History Narrative   Not on file   Social Determinants of Health   Financial Resource Strain: Low Risk  (11/22/2022)   Overall Financial Resource Strain (CARDIA)    Difficulty of Paying Living Expenses: Not very hard  Food Insecurity: No Food Insecurity (11/22/2022)   Hunger Vital Sign    Worried About Running Out of Food in the Last Year: Never true    Ran Out of Food in the  Last Year: Never true  Transportation Needs: No Transportation Needs (11/22/2022)   PRAPARE - Hydrologist (Medical): No    Lack of Transportation (Non-Medical): No  Physical Activity: Insufficiently Active (11/22/2022)   Exercise Vital Sign  Days of Exercise per Week: 1 day    Minutes of Exercise per Session: 10 min  Stress: No Stress Concern Present (11/22/2022)   [place]    Feeling of Stress : Only a little  Social Connections: Moderately Integrated (11/22/2022)   Social Connection and Isolation Panel [NHANES]    Frequency of Communication with Friends and Family: More than three times a week    Frequency of Social Gatherings with Friends and Family: More than three times a week    Attends Religious Services: More than 4 times per year    Active Member of [employer] or Organizations: No    Attends Music therapist: Never    Marital Status: Living with partner    Tobacco Counseling Ready to quit: Not Answered Counseling given: Not Answered Tobacco comments: some days less  10 per day    Clinical Intake:  Pre-visit preparation completed: Yes  Pain : No/denies pain     BMI - recorded: 29.55 Nutritional Status: BMI 25 -29 Overweight Nutritional Risks: None Diabetes: No  How often do you need to have someone help you when you read instructions, pamphlets, or other written materials from your doctor or pharmacy?: 2 - Rarely What is the last grade level you completed in school?: 8th grade  Diabetic?NO  Interpreter Needed?: No  Information entered by :: Sheehan Stacey, [place] 11/22/2022   Activities of Daily Living    11/22/2022    5:17 PM 11/22/2022    2:26 PM  In your present state of health, do you have any difficulty performing the following activities:  Hearing? 0 0  Vision? 0 0  Difficulty concentrating or making decisions? 0 0  Walking or climbing stairs? 1 1   Comment tiress shortness of breath tiress shortness of breath  Dressing or bathing? 0 0  Doing errands, shopping? 0 0  Preparing Food and eating ? N   Using the Toilet? N   In the past six months, have you accidently leaked urine? N   Do you have problems with loss of bowel control? N   Managing your Medications? N   Managing your Finances? N   Housekeeping or managing your Housekeeping? N     Patient Care Team: Virl Axe, MD as PCP - General (Internal Medicine) Stanford Breed Denice Bors, MD as PCP - Cardiology (Cardiology)  Indicate any recent Medical Services you may have received from other than [facility] providers in the past year (date may be approximate).     Assessment:   This is a routine wellness examination for Lucas Stark.  Hearing/Vision screen No results found.  Dietary issues and exercise activities discussed:     Goals Addressed   None   Depression Screen    11/22/2022    5:14 PM 11/22/2022    2:33 PM  PHQ 2/9 Scores  PHQ - 2 Score 0 0    Fall Risk    11/22/2022    5:17 PM 11/22/2022    2:26 PM 09/20/2022    1:59 PM  [place] in the past year? 0 0 0  Number falls in past yr: 0 0 0  Injury with Fall? 0 0 0  Risk for fall due to : No Fall Risks No Fall Risks No Fall Risks  Follow up Falls evaluation completed Falls evaluation completed;Falls prevention discussed Falls evaluation completed    FALL RISK PREVENTION PERTAINING TO THE HOME:  Any stairs in or around  the home? No  If so, are there any without handrails? No  Home free of loose throw rugs in walkways, pet beds, electrical cords, etc? Yes  Adequate lighting in your home to reduce risk of falls? Yes   ASSISTIVE DEVICES UTILIZED TO PREVENT FALLS:  Life alert? No  Use of a cane, walker or w/c? No  Grab bars in the bathroom? No  Shower chair or bench in shower? No  Elevated toilet seat or a handicapped toilet? No   TIMED UP AND GO:  Was the test performed? No .  Length of time to ambulate  10 feet: N/A sec.   Gait steady and fast without use of assistive device  Cognitive Function:        11/22/2022    5:18 PM  6CIT Screen  What Year? 0 points  What month? 0 points  What time? 0 points  Count back from 20 0 points  Months in reverse 0 points  Repeat phrase 0 points  Total Score 0 points    Immunizations Immunization History  Administered Date(s) Administered   Moderna Sars-Covid-2 Vaccination 12/14/2019, 01/11/2020   PNEUMOCOCCAL CONJUGATE-20 11/22/2022   Tdap 11/22/2022    TDAP status: Up to date  Flu Vaccine status: Due, Education has been provided regarding the importance of this vaccine. Advised may receive this vaccine at local pharmacy or Health Dept. Aware to provide a copy of the vaccination record if obtained from local pharmacy or Health Dept. Verbalized acceptance and understanding.  Pneumococcal vaccine status: Up to date  Covid-19 vaccine status: Information provided on how to obtain vaccines.   Qualifies for Shingles Vaccine? Yes   Zostavax completed No   Shingrix Completed?: No.    Education has been provided regarding the importance of this vaccine. Patient has been advised to call insurance company to determine out of pocket expense if they have not yet received this vaccine. Advised may also receive vaccine at local pharmacy or Health Dept. Verbalized acceptance and understanding.  Screening Tests Health Maintenance  Topic Date Due   Zoster Vaccines- Shingrix (1 of 2) Never done   COVID-19 Vaccine (3 - 2023-24 season) 05/13/2022   INFLUENZA VACCINE  12/11/2023 (Originally 04/12/2022)   Medicare Annual Wellness (AWV)  11/22/2023   COLONOSCOPY (Pts 45-79yr Insurance coverage will need to be confirmed)  04/28/2027   DTaP/Tdap/Td (2 - Td or Tdap) 11/21/2032   Pneumonia Vaccine 65 Years old  Completed   Hepatitis C Screening  Completed   HIV Screening  Completed   HPV VACCINES  Aged Out   Lung Cancer Screening  Discontinued    Health  Maintenance  Health Maintenance Due  Topic Date Due   Zoster Vaccines- Shingrix (1 of 2) Never done   COVID-19 Vaccine (3 - 2023-24 season) 05/13/2022    Colorectal cancer screening: Type of screening: Colonoscopy. Completed 04/27/2020. Repeat every 7 years  Lung Cancer Screening: (Low Dose CT Chest recommended if Age 65-80years, 30 pack-year currently smoking OR have quit w/in 15years.) does not qualify.   Lung Cancer Screening Referral: Defer to PCP.   Additional Screening:  Hepatitis C Screening: does qualify; Completed 09/20/2022  Vision Screening: Recommended annual ophthalmology exams for early detection of glaucoma and other disorders of the eye. Is the patient up to date with their annual eye exam?  No  Who is the provider or what is the name of the office in which the patient attends annual eye exams? Defer to PCP.  If pt is not  established with a provider, would they like to be referred to a provider to establish care? No .   Dental Screening: Recommended annual dental exams for proper oral hygiene  Community Resource Referral / Chronic Care Management: CRR required this visit?  No   CCM required this visit?  No      Plan:     I have personally reviewed and noted the following in the patient's chart:   Medical and social history Use of alcohol, tobacco or illicit drugs  Current medications and supplements including opioid prescriptions. Patient is not currently taking opioid prescriptions. Functional ability and status Nutritional status Physical activity Advanced directives List of other physicians Hospitalizations, surgeries, and ER visits in previous 12 months Vitals Screenings to include cognitive, depression, and falls Referrals and appointments  In addition, I have reviewed and discussed with patient certain preventive protocols, quality metrics, and best practice recommendations. A written personalized care plan for preventive services as well as  general preventive health recommendations were provided to patient.     Lucas Stark, CMA   11/22/2022   Nurse Notes: Face to Face.  Lucas Stark , Thank you for taking time to come for your Medicare Wellness Visit. I appreciate your ongoing commitment to your health goals. Please review the following plan we discussed and let me know if I can assist you in the future.   These are the goals we discussed:  Goals   None     This is a list of the screening recommended for you and due dates:  Health Maintenance  Topic Date Due   Zoster (Shingles) Vaccine (1 of 2) Never done   COVID-19 Vaccine (3 - 2023-24 season) 05/13/2022   Flu Shot  12/11/2023*   Medicare Annual Wellness Visit  11/22/2023   Colon Cancer Screening  04/28/2027   DTaP/Tdap/Td vaccine (2 - Td or Tdap) 11/21/2032   Pneumonia Vaccine  Completed   Hepatitis C Screening: USPSTF Recommendation to screen - Ages 67-79 yo.  Completed   HIV Screening  Completed   HPV Vaccine  Aged Out   Screening for Lung Cancer  Discontinued  *Topic was postponed. The date shown is not the original due date.

## 2022-11-22 NOTE — Progress Notes (Signed)
CC: f/u HTN, cough  HPI:  Lucas Stark is a 67 y.o. male with history listed below presenting to the [facility] for f/u HTN, cough. Please see individualized problem based charting for full HPI.  Past Medical History:  Diagnosis Date   Alcohol abuse 03/12/2016   Appendicitis 04/29/2021   CAD (coronary artery disease) cardiologist-- dr Stanford Breed   a. 03/2016 NSTEMI/PCI: LM nl, LAD nl, RI nl, LCX nl, OM2 99 ( 2.75 x 16 Synergy DES), RCA nl, EF 45-50%.   ETOH abuse    GERD (gastroesophageal reflux disease)    History of cocaine abuse ([place])    per pt none since 2017   History of non-ST elevation myocardial infarction (NSTEMI) 03/14/2016   s/p  PCI and DES x1   Hyperlipidemia    Hypertension    Hypertensive heart disease    Ischemic cardiomyopathy    a. 03/2016 Echo: EF 35-40%, diff H, sev inflat HK, Gr2 DD;  b. 03/2016 EF 45-50% by LV gram.;  echo 10-24-2018, ef 45-50%   Knee effusion, left 08/15/2018   Myocardial infarction ([place]) 2017   NSTEMI    OA (osteoarthritis)    left knee   S/P drug eluting coronary stent placement 03/14/2016   DES x1 to OM1   Tobacco abuse    Wears dentures    upper   Wears partial dentures    lower    Review of Systems:  Negative aside from that listed in individualized problem based charting.  Physical Exam:  Vitals:   11/22/22 1428 11/22/22 1437  BP: (!) 150/92 (!) 148/89  Pulse: 89 86  Resp: (!) 24   Temp: 98.4 F (36.9 C)   TempSrc: Oral   SpO2: 97%   Weight: 180 lb 4.8 oz (81.8 kg)   Height: 5' 5.5" (1.664 m)    Physical Exam Constitutional:      Appearance: Normal appearance. He is not ill-appearing.  HENT:     Nose: Rhinorrhea present. No congestion.     Mouth/Throat:     Mouth: Mucous membranes are moist.     Pharynx: Oropharynx is clear. No oropharyngeal exudate.  Eyes:     General: No scleral icterus.    Extraocular Movements: Extraocular movements intact.     Conjunctiva/sclera: Conjunctivae normal.     Pupils: Pupils  are equal, round, and reactive to light.  Cardiovascular:     Rate and Rhythm: Normal rate and regular rhythm.     Pulses: Normal pulses.     Heart sounds: Normal heart sounds. No murmur heard.    No friction rub. No gallop.  Pulmonary:     Effort: Pulmonary effort is normal. No respiratory distress.     Breath sounds: No rhonchi or rales.     Comments: Mild expiratory wheezing bilaterally Abdominal:     General: Bowel sounds are normal.     Palpations: Abdomen is soft.     Tenderness: There is no abdominal tenderness. There is no guarding or rebound.  Musculoskeletal:        General: No swelling. Normal range of motion.  Skin:    General: Skin is warm and dry.  Neurological:     General: No focal deficit present.     Mental Status: He is alert and oriented to person, place, and time.  Psychiatric:        Mood and Affect: Mood normal.        Behavior: Behavior normal.      Assessment & Plan:  See Encounters Tab for problem based charting.  Patient discussed with Dr.  Cain Sieve

## 2022-11-22 NOTE — Patient Instructions (Signed)
Health Maintenance, Male Adopting a healthy lifestyle and getting preventive care are important in promoting health and wellness. Ask your health care provider about: The right schedule for you to have regular tests and exams. Things you can do on your own to prevent diseases and keep yourself healthy. What should I know about diet, weight, and exercise? Eat a healthy diet  Eat a diet that includes plenty of vegetables, fruits, low-fat dairy products, and lean protein. Do not eat a lot of foods that are high in solid fats, added sugars, or sodium. Maintain a healthy weight Body mass index (BMI) is a measurement that can be used to identify possible weight problems. It estimates body fat based on height and weight. Your health care provider can help determine your BMI and help you achieve or maintain a healthy weight. Get regular exercise Get regular exercise. This is one of the most important things you can do for your health. Most adults should: Exercise for at least 150 minutes each week. The exercise should increase your heart rate and make you sweat (moderate-intensity exercise). Do strengthening exercises at least twice a week. This is in addition to the moderate-intensity exercise. Spend less time sitting. Even light physical activity can be beneficial. Watch cholesterol and blood lipids Have your blood tested for lipids and cholesterol at 65 years of age, then have this test every 5 years. You may need to have your cholesterol levels checked more often if: Your lipid or cholesterol levels are high. You are older than 65 years of age. You are at high risk for heart disease. What should I know about cancer screening? Many types of cancers can be detected early and may often be prevented. Depending on your health history and family history, you may need to have cancer screening at various ages. This may include screening for: Colorectal cancer. Prostate cancer. Skin cancer. Lung  cancer. What should I know about heart disease, diabetes, and high blood pressure? Blood pressure and heart disease High blood pressure causes heart disease and increases the risk of stroke. This is more likely to develop in people who have high blood pressure readings or are overweight. Talk with your health care provider about your target blood pressure readings. Have your blood pressure checked: Every 3-5 years if you are 18-39 years of age. Every year if you are 40 years old or older. If you are between the ages of 65 and 75 and are a current or former smoker, ask your health care provider if you should have a one-time screening for abdominal aortic aneurysm (AAA). Diabetes Have regular diabetes screenings. This checks your fasting blood sugar level. Have the screening done: Once every three years after age 45 if you are at a normal weight and have a low risk for diabetes. More often and at a younger age if you are overweight or have a high risk for diabetes. What should I know about preventing infection? Hepatitis B If you have a higher risk for hepatitis B, you should be screened for this virus. Talk with your health care provider to find out if you are at risk for hepatitis B infection. Hepatitis C Blood testing is recommended for: Everyone born from 1945 through 1965. Anyone with known risk factors for hepatitis C. Sexually transmitted infections (STIs) You should be screened each year for STIs, including gonorrhea and chlamydia, if: You are sexually active and are younger than 65 years of age. You are older than 65 years of age and your   health care provider tells you that you are at risk for this type of infection. Your sexual activity has changed since you were last screened, and you are at increased risk for chlamydia or gonorrhea. Ask your health care provider if you are at risk. Ask your health care provider about whether you are at high risk for HIV. Your health care provider  may recommend a prescription medicine to help prevent HIV infection. If you choose to take medicine to prevent HIV, you should first get tested for HIV. You should then be tested every 3 months for as long as you are taking the medicine. Follow these instructions at home: Alcohol use Do not drink alcohol if your health care provider tells you not to drink. If you drink alcohol: Limit how much you have to 0-2 drinks a day. Know how much alcohol is in your drink. In the [place] one drink equals one 12 oz bottle of beer (355 mL), one 5 oz glass of wine (148 mL), or one 1 oz glass of hard liquor (44 mL). Lifestyle Do not use any products that contain nicotine or tobacco. These products include cigarettes, chewing tobacco, and vaping devices, such as e-cigarettes. If you need help quitting, ask your health care provider. Do not use street drugs. Do not share needles. Ask your health care provider for help if you need support or information about quitting drugs. General instructions Schedule regular health, dental, and eye exams. Stay current with your vaccines. Tell your health care provider if: You often feel depressed. You have ever been abused or do not feel safe at home. Summary Adopting a healthy lifestyle and getting preventive care are important in promoting health and wellness. Follow your health care provider's instructions about healthy diet, exercising, and getting tested or screened for diseases. Follow your health care provider's instructions on monitoring your cholesterol and blood pressure. This information is not intended to replace advice given to you by your health care provider. Make sure you discuss any questions you have with your health care provider. Document Revised: 01/18/2021 Document Reviewed: 01/18/2021 Elsevier Patient Education  2023 [employer]  

## 2022-11-22 NOTE — Patient Instructions (Addendum)
Lucas Stark,  It was a pleasure seeing you in the clinic today.   For your cough and wheezing: I have prescribed an albuterol inhaler to use as needed for your wheezing. I recommend getting Mucinex-DM (Robitussin-DM) over the counter to help with your cough. I have also ordered lung testing to make sure you do not have asthma or other lung disease. Someone will call you to schedule this. You got your pneumonia vaccine and tetanus vaccine today. I have also given you a script for your shingles vaccine (you can get this at any pharmacy). Please make sure to take your blood pressure medications like we discussed. Please come back in 1 month for your next visit.  Please call our clinic at [phone] if you have any questions or concerns. The best time to call is Monday-Friday from 9am-4pm, but there is someone available 24/7 at the same number. If you need medication refills, please notify your pharmacy one week in advance and they will send [place] a request.   Thank you for letting us take part in your care. We look forward to seeing you next time!

## 2022-11-22 NOTE — Assessment & Plan Note (Signed)
Patient complaining of cough, runny nose, and mild wheezing over the past 5 days. He denies any sick contacts but does not know if the people around him (friends, family) were sick recently. He has a mild mostly-nonproductive cough. He feels like he has some mucus stuck in his throat but hard for him to cough it out. Denies any fevers, chills, SHOB. Has noted a runny nose recently as well. He has also noted a mild wheezing associated with this cough, particularly at nighttime. He has strong smoking history (about 10 cigarettes/day for 40+ years). He has never had any PFTs done and has never been told he has asthma or COPD. I do appreciate mild expiratory wheezing on exam.   I have prescribed him an albuterol inhaler for his wheezing and have ordered PFTs to further assess for lung pathology. PFTs will also help guide future management. For his likely viral illness, have recommended OTC mucinex-DM to help with cough and possible mucus production. He is not ready to quit cigarettes at this time.

## 2022-11-23 NOTE — Progress Notes (Signed)
I reviewed the AWV findings with the provider who conducted the visit. I was present in the office suite and immediately available to provide assistance and direction throughout the time the service was provided.  

## 2022-11-23 NOTE — Progress Notes (Signed)
Internal Medicine Clinic Attending  Case discussed with Dr. Jinwala  At the time of the visit.  We reviewed the resident's history and exam and pertinent patient test results.  I agree with the assessment, diagnosis, and plan of care documented in the resident's note.  

## 2022-11-28 ENCOUNTER — Telehealth: Payer: Self-pay | Admitting: Student

## 2022-11-28 NOTE — Telephone Encounter (Signed)
Pt Requesting a Different Cough medicine.  Patient states the over the counter medication Mucinex-DM (Robitussin-DM)   did not work.  Please call the patient back.

## 2022-11-28 NOTE — Telephone Encounter (Addendum)
I called pt for further details - no answer.

## 2022-11-28 NOTE — Telephone Encounter (Signed)
Call to Respiratory Care to schedule PFT's for patient.  Message left for Respiratory to schedule appointment for patient.  RTC from [place] in Respiratory.  Patient has been scheduled for PFT's on 11/30/2022 at 9 AM. Patient also mentioned to Respiratory that he has a cough and that the medication he was given does not help -patient said that he feels that something is in his throat and he cannot get it up.  Unable to cough up anything. Worsens at night per patient.  Interrupts his sleep.  No fever. Patient was informed that a message would be sent to his doctor to see what other medication can be prescribed for hi,.

## 2022-11-30 ENCOUNTER — Ambulatory Visit (HOSPITAL_COMMUNITY)
Admission: RE | Admit: 2022-11-30 | Discharge: 2022-11-30 | Disposition: A | Discharge status: Home / Self Care | Payer: 59 | Source: Ambulatory Visit | Attending: Internal Medicine | Admitting: Internal Medicine

## 2022-11-30 DIAGNOSIS — R062 Wheezing: Secondary | ICD-10-CM | POA: Diagnosis not present

## 2022-11-30 DIAGNOSIS — Z720 Tobacco use: Secondary | ICD-10-CM | POA: Diagnosis not present

## 2022-11-30 LAB — PULMONARY FUNCTION TEST
DL/VA % pred: 70 %
DL/VA: 3.01 ml/min/mmHg/L
DLCO unc % pred: 62 %
DLCO unc: 13.42 ml/min/mmHg
FEF 25-75 Post: 0.96 L/sec
FEF 25-75 Pre: 0.78 L/sec
FEF2575-%Change-Post: 23 %
FEF2575-%Pred-Post: 45 %
FEF2575-%Pred-Pre: 37 %
FEV1-%Change-Post: 6 %
FEV1-%Pred-Post: 64 %
FEV1-%Pred-Pre: 60 %
FEV1-Post: 1.66 L
FEV1-Pre: 1.56 L
FEV1FVC-%Change-Post: 2 %
FEV1FVC-%Pred-Pre: 81 %
FEV6-%Change-Post: 4 %
FEV6-%Pred-Post: 79 %
FEV6-%Pred-Pre: 76 %
FEV6-Post: 2.61 L
FEV6-Pre: 2.49 L
FEV6FVC-%Change-Post: 1 %
FEV6FVC-%Pred-Post: 104 %
FEV6FVC-%Pred-Pre: 103 %
FVC-%Change-Post: 3 %
FVC-%Pred-Post: 76 %
FVC-%Pred-Pre: 73 %
FVC-Post: 2.66 L
FVC-Pre: 2.57 L
Post FEV1/FVC ratio: 63 %
Post FEV6/FVC ratio: 98 %
Pre FEV1/FVC ratio: 61 %
Pre FEV6/FVC Ratio: 97 %
RV % pred: 159 %
RV: 3.13 L
TLC % pred: 100 %
TLC: 5.64 L

## 2022-11-30 MED ORDER — ALBUTEROL SULFATE (2.5 MG/3ML) 0.083% IN NEBU
2.5000 mg | INHALATION_SOLUTION | Freq: Once | RESPIRATORY_TRACT | Status: AC
  Administered 2022-11-30: 2.5 mg via RESPIRATORY_TRACT

## 2022-12-20 ENCOUNTER — Encounter: Payer: Self-pay | Admitting: Student

## 2022-12-20 ENCOUNTER — Ambulatory Visit (INDEPENDENT_AMBULATORY_CARE_PROVIDER_SITE_OTHER): Payer: 59 | Admitting: Student

## 2022-12-20 ENCOUNTER — Other Ambulatory Visit: Payer: Self-pay

## 2022-12-20 VITALS — BP 148/98 | HR 59 | Temp 97.6°F | Resp 28 | Ht 65.5 in | Wt 178.6 lb

## 2022-12-20 DIAGNOSIS — J439 Emphysema, unspecified: Secondary | ICD-10-CM | POA: Diagnosis not present

## 2022-12-20 DIAGNOSIS — F17210 Nicotine dependence, cigarettes, uncomplicated: Secondary | ICD-10-CM | POA: Diagnosis not present

## 2022-12-20 DIAGNOSIS — Z0000 Encounter for general adult medical examination without abnormal findings: Secondary | ICD-10-CM

## 2022-12-20 DIAGNOSIS — J449 Chronic obstructive pulmonary disease, unspecified: Secondary | ICD-10-CM | POA: Diagnosis not present

## 2022-12-20 DIAGNOSIS — I5022 Chronic systolic (congestive) heart failure: Secondary | ICD-10-CM | POA: Diagnosis not present

## 2022-12-20 DIAGNOSIS — I110 Hypertensive heart disease with heart failure: Secondary | ICD-10-CM | POA: Diagnosis not present

## 2022-12-20 DIAGNOSIS — Z720 Tobacco use: Secondary | ICD-10-CM

## 2022-12-20 DIAGNOSIS — I10 Essential (primary) hypertension: Secondary | ICD-10-CM

## 2022-12-20 MED ORDER — UMECLIDINIUM BROMIDE 62.5 MCG/ACT IN AEPB: 1.0000 | INHALATION_SPRAY | Freq: Every day | RESPIRATORY_TRACT | 1 refills | Status: DC

## 2022-12-20 MED ORDER — NICOTINE POLACRILEX 4 MG MT GUM
4.0000 mg | CHEWING_GUM | OROMUCOSAL | 1 refills | Status: DC | PRN
Start: 2022-12-20 — End: 2023-10-12

## 2022-12-20 MED ORDER — NICOTINE 21 MG/24HR TD PT24
21.0000 mg | MEDICATED_PATCH | Freq: Every day | TRANSDERMAL | 0 refills | Status: DC
Start: 2022-12-20 — End: 2023-10-12

## 2022-12-20 NOTE — Progress Notes (Signed)
Subjective:   Patient ID: Lucas Stark male   DOB: [birth year] 67 y.o.   MRN: [id number]  HPI: [name] is a 67 y.o. male with a PMHx has presented below who presents for follow up. Please see problem bases assessment and plan for further work up and management.   Patient Active Problem List   Diagnosis Date Noted   COPD (chronic obstructive pulmonary disease) 11/22/2022   Healthcare maintenance 11/22/2022   High-risk sexual behavior 09/20/2022   Primary osteoarthritis of left knee 10/24/2018   Heart failure with mid-range ejection fraction (HFmEF) 03/15/2016   Dyslipidemia, goal LDL below 70 03/15/2016   CAD S/P percutaneous coronary angioplasty 03/15/2016   Cardiomyopathy, ischemic 03/15/2016   History of non-ST elevation myocardial infarction (NSTEMI) 03/12/2016   Benign essential HTN 03/12/2016   Tobacco abuse 03/12/2016   Cocaine use 03/12/2016     Current Outpatient Medications  Medication Sig Dispense Refill   nicotine (NICODERM CQ - DOSED IN MG/24 HOURS) 21 mg/24hr patch Place 1 patch (21 mg total) onto the skin daily. 28 patch 0   nicotine polacrilex (NICORETTE) 4 MG gum Take 1 each (4 mg total) by mouth as needed for smoking cessation. 100 tablet 1   umeclidinium bromide (INCRUSE ELLIPTA) 62.5 MCG/ACT AEPB Inhale 1 puff into the lungs daily. 1 each 1   acetaminophen (TYLENOL) 500 MG tablet Take 1,000 mg by mouth every 6 (six) hours as needed for moderate pain or headache.     albuterol (VENTOLIN HFA) 108 (90 Base) MCG/ACT inhaler Inhale 2 puffs into the lungs every 6 (six) hours as needed for wheezing or shortness of breath. 8 g 2   aspirin EC 81 MG tablet Take 81 mg by mouth daily. Swallow whole.     Bempedoic Acid (NEXLETOL) 180 MG TABS Take 1 tablet (180 mg total) by mouth daily. 90 tablet 0   carvedilol (COREG) 6.25 MG tablet Take 1 tablet (6.25 mg total) by mouth 2 (two) times daily. 180 tablet 3   chlorthalidone (HYGROTON) 25 MG tablet Take 0.5  tablets (12.5 mg total) by mouth daily. 45 tablet 3   ezetimibe (ZETIA) 10 MG tablet Take 1 tablet (10 mg total) by mouth daily. 90 tablet 3   losartan (COZAAR) 100 MG tablet Take 1 tablet (100 mg total) by mouth daily. 90 tablet 3   nitroGLYCERIN (NITROSTAT) 0.4 MG SL tablet Place 1 tablet (0.4 mg total) under the tongue every 5 (five) minutes as needed for chest pain. 25 tablet 3   pantoprazole (PROTONIX) 20 MG tablet Take 1 tablet (20 mg total) by mouth daily. 90 tablet 0   rosuvastatin (CRESTOR) 40 MG tablet Take 1 tablet (40 mg total) by mouth daily. 90 tablet 3   sildenafil (REVATIO) 20 MG tablet SMARTSIG:1-3 Tablet(s) By Mouth     No current facility-administered medications for this visit.     Review of Systems: Pertinent ROS present in the A&P. Otherwise negative.   Objective:   Physical Exam: Vitals:   12/20/22 0831 12/20/22 0836  BP: (!) 147/99 (!) 148/98  Pulse: 63 (!) 59  Resp: (!) 28   Temp: 97.6 F (36.4 C)   TempSrc: Oral   SpO2: 99%   Weight: 178 lb 9.6 oz (81 kg)   Height: 5' 5.5" (1.664 m)    Physical Exam Constitutional:      Appearance: Normal appearance.  HENT:     Head: Normocephalic and atraumatic.  Cardiovascular:  Rate and Rhythm: Normal rate and regular rhythm.  Pulmonary:     Effort: Pulmonary effort is normal. No respiratory distress.     Breath sounds: Normal breath sounds. No wheezing.  Musculoskeletal:        General: No swelling. Normal range of motion.     Cervical back: Normal range of motion.     Comments: Trace edema of the bilateral LE  Skin:    General: Skin is warm and dry.  Neurological:     General: No focal deficit present.     Mental Status: He is alert and oriented to person, place, and time.  Psychiatric:        Mood and Affect: Mood normal.        Behavior: Behavior normal.      Assessment & Plan:   Benign essential HTN Pt's blood pressure elevated at 148/98 today. Patient notes that he did smoke a cigarette  just before arriving this morning. He does not check his blood pressure at home. Current medications include carvedilol 6.25 BID, chlorthalidone 25 mg, and losartan 100 mg. He mentions that he has not been taking his medications on a daily basis as he feels they make him tired. Discussed the importance of taking the medications everyday which patient understood and was agreeable to. Will follow up in 1 month and consider increasing the carvedilol if his blood pressure is still elevated at that time.   > Carvedilol 6.25 mg BID > chlorthalidone 25 mg > losartan 100 mg   Heart failure with mid-range ejection fraction (HFmEF) (HCC) Heart failure appears well controlled today. Patient report no significant weight gain. States he does get short of breath with minimal exertion such as tying his shoes or walking 8 steps. This is not new for him. He does not get short of breath while at rest. Denies any chest pain or swelling. He is currently taking losartan and coreg. States he does not take these medications on a daily basis. Encourage him to do so. No indication for any major adjustments today.  > losartan 100 mg > carvedilol 6.25 mg BID  Tobacco abuse Patient smokes about 6 cigarettes daily. States he would like to quit. Discussed Chantix as an option, however patient hesitant as he is concerned about the side effects (vivid dreams). He would instead like to try the nicotine patch and gum as needed.   > nicotine patch > Nicoderm   Healthcare maintenance Pt would like to get the shingles vaccine. He has plans to follow up at his local pharmacy.  > Shingrix   COPD (chronic obstructive pulmonary disease) Pt seen at last visit for persistent dry cough, intermittent wheezing, and runny nose with no known history of asthma or COPD. He was given an as needed albuterol inhaler at that time. PFT was obtained and revealed reduced FEV1/FVC ratio consistent with obstructive pathology with concurrent  restrictive process. Clincial trial of bronchodilators may be beneficial per pulmonology. Today, reports wheezing and runny nose has resolved however he continues to have a dry cough and feeling as his something is stuck in his throat. On exam, lungs were CTAB. Given smoking history and findings of PFT, presentation is consistent with COPD. Will trial daily Incruse Ellipa.Given newly diagnosed CODP, will obtain alpha 1 antitrypsin labs  > Start Incruse Ellipa daily > continue albuterol inhaler as needed > alpha 1 antitrypsin labs

## 2022-12-20 NOTE — Progress Notes (Signed)
Attestation for Student Documentation:  I personally was present and performed or re-performed the history, physical exam and medical decision-making activities of this service and have verified that the service and findings are accurately documented in the student's note.  Merrilyn Puma, MD 12/20/2022, 3:46 PM

## 2022-12-20 NOTE — Assessment & Plan Note (Signed)
Patient smokes about 6 cigarettes daily. States he would like to quit. Discussed Chantix as an option, however patient hesitant as he is concerned about the side effects (vivid dreams). He would instead like to try the nicotine patch and gum as needed.   > nicotine patch > Nicoderm

## 2022-12-20 NOTE — Assessment & Plan Note (Signed)
Heart failure appears well controlled today. Patient report no significant weight gain. States he does get short of breath with minimal exertion such as tying his shoes or walking 8 steps. This is not new for him. He does not get short of breath while at rest. Denies any chest pain or swelling. He is currently taking losartan and coreg. States he does not take these medications on a daily basis. Encourage him to do so. No indication for any major adjustments today.  > losartan 100 mg > carvedilol 6.25 mg BID

## 2022-12-20 NOTE — Assessment & Plan Note (Signed)
Pt would like to get the shingles vaccine. He has plans to follow up at his local pharmacy.  > Shingrix

## 2022-12-20 NOTE — Assessment & Plan Note (Addendum)
Pt seen at last visit for persistent dry cough, intermittent wheezing, and runny nose with no known history of asthma or COPD. He was given an as needed albuterol inhaler at that time. PFT was obtained and revealed reduced FEV1/FVC ratio consistent with obstructive pathology with concurrent restrictive process. Clincial trial of bronchodilators may be beneficial per pulmonology. Today, reports wheezing and runny nose has resolved however he continues to have a dry cough and feeling as his something is stuck in his throat. On exam, lungs were CTAB. Given smoking history and findings of PFT, presentation is consistent with COPD. Will trial daily Incruse Ellipa.Given newly diagnosed CODP, will obtain alpha 1 antitrypsin labs  > Start Incruse Ellipa daily > continue albuterol inhaler as needed > alpha 1 antitrypsin labs

## 2022-12-20 NOTE — Patient Instructions (Addendum)
Today we discussed the following:  1) COPD Your pulmonary function tests demonstrates likely COPD. Please begin taking Incruse Ellipa (inhale 1 puff daily). You may continue to use your albuterol inhaler as needed.   2) Hypertension Your blood pressure was elevated today. Please continue to take your carvedilol, chlorthalidone, and losartan on a daily basis.  3) Heart Failure Please continue to take your losartan and carvedilol dail.  4) Smoking Cessation Please begin using the nicotine patch (1 patch daily). Additionally, use the nicotine gum as needed for cravings.  5) Health maintenance You may obtain your shingles vaccine at your local pharmacy.   Please follow up in 1 month

## 2022-12-20 NOTE — Assessment & Plan Note (Addendum)
Pt's blood pressure elevated at 148/98 today. Patient notes that he did smoke a cigarette just before arriving this morning. He does not check his blood pressure at home. Current medications include carvedilol 6.25 BID, chlorthalidone 25 mg, and losartan 100 mg. He mentions that he has not been taking his medications on a daily basis as he feels they make him tired. Discussed the importance of taking the medications everyday which patient understood and was agreeable to. Will follow up in 1 month and consider increasing the carvedilol if his blood pressure is still elevated at that time.   > Carvedilol 6.25 mg BID > chlorthalidone 25 mg > losartan 100 mg

## 2022-12-22 ENCOUNTER — Telehealth (HOSPITAL_COMMUNITY): Payer: Self-pay

## 2022-12-22 ENCOUNTER — Other Ambulatory Visit (HOSPITAL_COMMUNITY): Payer: Self-pay

## 2022-12-22 NOTE — Progress Notes (Signed)
Internal Medicine Clinic Attending  Case discussed with Dr. Jinwala  At the time of the visit.  We reviewed the resident's history and exam and pertinent patient test results.  I agree with the assessment, diagnosis, and plan of care documented in the resident's note.  

## 2022-12-22 NOTE — Telephone Encounter (Signed)
Patient Advocate Encounter  Prior authorization for Nexletol submitted and APPROVED. Test billing returns $0 copay for 90 day supply.  [facility] Effective: 12/22/2022 - 09/12/2023  Burnell Blanks, CPhT Rx Patient Advocate Phone: [phone]

## 2022-12-23 LAB — ALPHA-1-ANTITRYPSIN PHENOTYP: A-1 Antitrypsin: 135 mg/dL (ref 101–187)

## 2023-02-13 ENCOUNTER — Encounter: Payer: Self-pay | Admitting: *Deleted

## 2023-06-01 ENCOUNTER — Encounter: Payer: Self-pay | Admitting: Student

## 2023-06-01 ENCOUNTER — Ambulatory Visit (INDEPENDENT_AMBULATORY_CARE_PROVIDER_SITE_OTHER): Payer: 59 | Admitting: Student

## 2023-06-01 VITALS — BP 164/97 | HR 69 | Temp 98.1°F | Ht 65.0 in | Wt 172.0 lb

## 2023-06-01 DIAGNOSIS — R21 Rash and other nonspecific skin eruption: Secondary | ICD-10-CM | POA: Diagnosis not present

## 2023-06-01 DIAGNOSIS — R053 Chronic cough: Secondary | ICD-10-CM

## 2023-06-01 DIAGNOSIS — G4733 Obstructive sleep apnea (adult) (pediatric): Secondary | ICD-10-CM | POA: Diagnosis not present

## 2023-06-01 DIAGNOSIS — I10 Essential (primary) hypertension: Secondary | ICD-10-CM

## 2023-06-01 HISTORY — DX: Obstructive sleep apnea (adult) (pediatric): G47.33

## 2023-06-01 MED ORDER — PANTOPRAZOLE SODIUM 20 MG PO TBEC: 20.0000 mg | DELAYED_RELEASE_TABLET | Freq: Every day | ORAL | 0 refills | Status: DC

## 2023-06-01 MED ORDER — HYDROXYZINE HCL 25 MG PO TABS: 25.0000 mg | ORAL_TABLET | Freq: Three times a day (TID) | ORAL | 0 refills | Status: AC | PRN

## 2023-06-01 MED ORDER — TRIAMCINOLONE ACETONIDE 0.025 % EX OINT: 1.0000 | TOPICAL_OINTMENT | Freq: Two times a day (BID) | CUTANEOUS | 0 refills | Status: DC

## 2023-06-01 NOTE — Patient Instructions (Signed)
Thank you, [name] for allowing [place] to provide your care today. Today we discussed your rash, sleep apnea, and cough.  For your rash, we we will prescribe you a medication called Atarax, which you should take once a day at night to help you with the itching.  We are also prescribing Kenalog cream, which she can apply to the rash twice during the day as needed.  For sleep apnea, it looks like you had a sleep study done back in 2020 that showed sleep apnea. I have placed a referral to the sleep doctor and they should contact you about setting up an appointment.   For your cough, your symptoms sound like you may have acid reflux, so we will try a medication called Protonix.  You should take this once daily and I would expect improvement in the next month.    Referrals ordered today:    Referral Orders         Ambulatory referral to Neurology      I have ordered the following medication/changed the following medications:   Start the following medications: Meds ordered this encounter  Medications   hydrOXYzine (ATARAX) 25 MG tablet    Sig: Take 1 tablet (25 mg total) by mouth every 8 (eight) hours as needed (take at night for itching).    Dispense:  30 tablet    Refill:  0   triamcinolone (KENALOG) 0.025 % ointment    Sig: Apply 1 Application topically 2 (two) times daily.    Dispense:  30 g    Refill:  0   pantoprazole (PROTONIX) 20 MG tablet    Sig: Take 1 tablet (20 mg total) by mouth daily.    Dispense:  90 tablet    Refill:  0     Follow up: 1 month  We look forward to seeing you next time. Please call our clinic at [phone] if you have any questions or concerns. The best time to call is Monday-Friday from 9am-4pm, but there is someone available 24/7. If after hours or the weekend, call the main hospital number and ask for the Internal Medicine Resident On-Call. If you need medication refills, please notify your pharmacy one week in advance and they will send [place] a  request.   Thank you for trusting me with your care. Wishing you the best!   Annett Fabian, MD [facility]

## 2023-06-01 NOTE — Assessment & Plan Note (Signed)
His blood pressure was elevated today at 164/97.  His current medications include carvedilol 6.25 twice daily, chlorthalidone 25 mg, losartan 100 mg.  He has not been taking these medications on a regular basis.  Since he has not had a follow-up visit in a while, and this is an acute visit, I have scheduled a follow-up visit in 4 weeks to address his hypertension and other chronic conditions.

## 2023-06-01 NOTE — Assessment & Plan Note (Addendum)
Patient presents for an acute visit for an itchy rash which he says began a week ago.  He has scattered erythematous raised lesions on his abdomen, consistent with insect bites.  He said he had some on his arms and his feet previously, but they have resolved.  He denies being bitten by any insects and says he wears long clothing whenever he goes outside.  He does not have any lesions in between his fingertips or on his palms. No one in his household has a similar rash. He says the itching is worse at night and prevents him from sleeping.  He denies any systemic symptoms. For now, we will treat symptoms and monitor for improvement.   Plan - Hydroxyzine 25 mg nightly as needed - Triamcinolone cream twice a day as needed - Follow up in 4 weeks

## 2023-06-01 NOTE — Progress Notes (Signed)
CC: rash, cough  HPI:  Lucas Stark is a 67 y.o. male living with a history stated below and presents today for an acute visit. Please see problem based assessment and plan for additional details.  Past Medical History:  Diagnosis Date   Alcohol abuse 03/12/2016   Appendicitis 04/29/2021   CAD (coronary artery disease) cardiologist-- dr Jens Som   a. 03/2016 NSTEMI/PCI: LM nl, LAD nl, RI nl, LCX nl, OM2 99 ( 2.75 x 16 Synergy DES), RCA nl, EF 45-50%.   ETOH abuse    GERD (gastroesophageal reflux disease)    History of cocaine abuse (HCC)    per pt none since 2017   History of non-ST elevation myocardial infarction (NSTEMI) 03/14/2016   s/p  PCI and DES x1   Hyperlipidemia    Hypertension    Hypertensive heart disease    Ischemic cardiomyopathy    a. 03/2016 Echo: EF 35-40%, diff H, sev inflat HK, Gr2 DD;  b. 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     albuterol (VENTOLIN HFA) 108 (90 Base) MCG/ACT inhaler Inhale 2 puffs into the lungs every 6 (six) hours as needed for wheezing or shortness of breath. 8 g 2   aspirin EC 81 MG tablet Take 81 mg by mouth daily. Swallow whole.     Bempedoic Acid (NEXLETOL) 180 MG TABS Take 1 tablet (180 mg total) by mouth daily. 90 tablet 0   carvedilol (COREG) 6.25 MG tablet Take 1 tablet (6.25 mg total) by mouth 2 (two) times daily. 180 tablet 3   chlorthalidone (HYGROTON) 25 MG tablet Take 0.5 tablets (12.5 mg  total) by mouth daily. 45 tablet 3   ezetimibe (ZETIA) 10 MG tablet Take 1 tablet (10 mg total) by mouth daily. 90 tablet 3   losartan (COZAAR) 100 MG tablet Take 1 tablet (100 mg total) by mouth daily. 90 tablet 3   nicotine (NICODERM CQ - DOSED IN MG/24 HOURS) 21 mg/24hr patch Place 1 patch (21 mg total) onto the skin daily. 28 patch 0   nicotine polacrilex (NICORETTE) 4 MG gum Take 1 each (4 mg total) by mouth as needed for smoking cessation. 100 tablet 1   nitroGLYCERIN (NITROSTAT) 0.4 MG SL tablet Place 1 tablet (0.4 mg total) under the tongue every 5 (five) minutes as needed for chest pain. 25 tablet 3   rosuvastatin (CRESTOR) 40 MG tablet Take 1 tablet (40 mg total) by mouth daily. 90 tablet 3   sildenafil (REVATIO) 20 MG tablet SMARTSIG:1-3 Tablet(s) By Mouth     umeclidinium bromide (INCRUSE ELLIPTA) 62.5 MCG/ACT AEPB Inhale 1 puff into the lungs daily. 1 each 1   No current facility-administered medications on file prior to visit.    Family History  Problem Relation Age of Onset   Heart attack Father    Heart attack Sister  Colon polyps Maternal Uncle    Colon cancer Neg Hx    Esophageal cancer Neg Hx    Stomach cancer Neg Hx    Rectal cancer Neg Hx     Social History   Socioeconomic History   Marital status: Legally Separated    Spouse name: Not on file   Number of children: 8   Years of education: Not on file   Highest education level: Not on file  Occupational History   Not on file  Tobacco Use   Smoking status: Some Days    Current packs/day: 0.50    Average packs/day: 0.5 packs/day for 35.0 years (17.5 ttl pk-yrs)    Types: Cigarettes   Smokeless tobacco: Never   Tobacco comments:    some days less  6  per day   Vaping Use   Vaping status: Never Used  Substance and Sexual Activity   Alcohol use: Yes    Alcohol/week: 0.0 standard drinks of alcohol    Comment: pint liquor (gin) daily; 11-06-2018 last two days had a airplane bottle each daily, and on  weekend one beer and couple of drink   Drug use: Not Currently    Comment: 11-06-2018 per pt last cocaine "when I had my heart attack"  2017   Sexual activity: Not Currently  Other Topics Concern   Not on file  Social History Narrative   Not on file   Social Determinants of Health   Financial Resource Strain: Low Risk  (11/22/2022)   Overall Financial Resource Strain (CARDIA)    Difficulty of Paying Living Expenses: Not very hard  Food Insecurity: Not on file (05/22/2023)  Transportation Needs: No Transportation Needs (11/22/2022)   PRAPARE - Administrator, Civil Service (Medical): No    Lack of Transportation (Non-Medical): No  Physical Activity: Insufficiently Active (11/22/2022)   Exercise Vital Sign    Days of Exercise per Week: 1 day    Minutes of Exercise per Session: 10 min  Stress: No Stress Concern Present (11/22/2022)   [employer] of Occupational Health - Occupational Stress Questionnaire    Feeling of Stress : Only a little  Social Connections: Not on File (05/27/2023)   Received from [facility]   Social Connections    Connectedness: 0  Intimate Partner Violence: Not At Risk (11/22/2022)   Humiliation, Afraid, Rape, and Kick questionnaire    Fear of Current or Ex-Partner: No    Emotionally Abused: No    Physically Abused: No    Sexually Abused: No   Review of Systems: ROS negative except for what is noted on the assessment and plan.  Vitals:   06/01/23 1359 06/01/23 1429  BP: (!) 153/87 (!) 164/97  Pulse: 69   Temp: 98.1 F (36.7 C)   TempSrc: Oral   SpO2: 96%   Weight: 172 lb (78 kg)   Height: 5\' 5"  (1.651 m)    Physical Exam: Constitutional: well appearing HENT: unremarkable Eyes: unremarkable Cardiovascular: regular rate and rhythm, no m/r/g Pulmonary/Chest: CTA bilaterally, normal respiratory effort Abdominal: soft, non-tender, non-distended MSK: normal bulk and tone Neurological: alert & oriented x 3, no focal deficits Skin: scattered  erythematous raised lesions on the abdomen, consistent with insect bites Psych: normal mood and behavior  Assessment & Plan:   Patient seen with Dr. Criselda Peaches  Rash Patient presents for an acute visit for an itchy rash which he says began a week ago.  He has scattered erythematous raised lesions on his abdomen, consistent with  insect bites.  He said he had some on his arms and his feet previously, but they have resolved.  He denies being bitten by any insects and says he wears long clothing whenever he goes outside.  He does not have any lesions in between his fingertips or on his palms. No one in his household has a similar rash. He says the itching is worse at night and prevents him from sleeping.  He denies any systemic symptoms. For now, we will treat symptoms and monitor for improvement.   Plan - Hydroxyzine 25 mg nightly as needed - Triamcinolone cream twice a day as needed - Follow up in 4 weeks  Obstructive sleep apnea Per chart review, patient had a sleep study back in March 2020 that showed obstructive sleep apnea.  At that time it was recommended that he begin using a CPAP machine.  He has not been using a CPAP since then and it seems he may have been lost to follow up.  He is complaining of morning headaches, fatigue, and that sensation of waking up choking at night.  I spoke with him about the importance of using a CPAP machine for sleep apnea and the benefits of doing so.  He is agreeable to using CPAP.  He will likely need a repeat sleep study at this point.  Plan -Placed referral to Dr. Frances Furbish, who he previously saw  Chronic cough He reports a chronic wet cough for years, not improved with his regular inhaler use.  He says he is using his Incruse Ellipta and albuterol once per day every day.  He says the cough worsens at night and when he lays down.  He also describes some discomfort in his lower sternum area when he coughs.  He denies any systemic symptoms, is euvolemic on exam,  has no shortness of breath.  His symptoms are consistent with acid reflux, so we will trial him on a PPI and follow-up in 4 weeks.  Plan - Protonix 20 mg daily  Benign essential HTN His blood pressure was elevated today at 164/97.  His current medications include carvedilol 6.25 twice daily, chlorthalidone 25 mg, losartan 100 mg.  He has not been taking these medications on a regular basis.  Since he has not had a follow-up visit in a while, and this is an acute visit, I have scheduled a follow-up visit in 4 weeks to address his hypertension and other chronic conditions.   Annett Fabian, MD  [facility] Internal Medicine, PGY-1 Phone: [phone] Date 06/01/2023 Time 2:51 PM

## 2023-06-01 NOTE — Assessment & Plan Note (Addendum)
Per chart review, patient had a sleep study back in March 2020 that showed obstructive sleep apnea.  At that time it was recommended that he begin using a CPAP machine.  He has not been using a CPAP since then and it seems he may have been lost to follow up.  He is complaining of morning headaches, fatigue, and that sensation of waking up choking at night.  I spoke with him about the importance of using a CPAP machine for sleep apnea and the benefits of doing so.  He is agreeable to using CPAP.  He will likely need a repeat sleep study at this point.  Plan -Placed referral to Dr. Frances Furbish, who he previously saw

## 2023-06-01 NOTE — Assessment & Plan Note (Signed)
He reports a chronic wet cough for years, not improved with his regular inhaler use.  He says he is using his Incruse Ellipta and albuterol once per day every day.  He says the cough worsens at night and when he lays down.  He also describes some discomfort in his lower sternum area when he coughs.  He denies any systemic symptoms, is euvolemic on exam, has no shortness of breath.  His symptoms are consistent with acid reflux, so we will trial him on a PPI and follow-up in 4 weeks.  Plan - Protonix 20 mg daily

## 2023-06-06 ENCOUNTER — Other Ambulatory Visit (HOSPITAL_COMMUNITY): Payer: Self-pay

## 2023-06-08 NOTE — Progress Notes (Signed)
 Internal Medicine Clinic Attending  I was physically present during the key portions of the resident provided service and participated in the medical decision making of patient's management care. I reviewed pertinent patient test results.  The assessment, diagnosis, and plan were formulated together and I agree with the documentation in the resident's note.  Inez Catalina, MD

## 2023-06-21 ENCOUNTER — Other Ambulatory Visit: Payer: Self-pay

## 2023-06-21 DIAGNOSIS — J439 Emphysema, unspecified: Secondary | ICD-10-CM

## 2023-06-22 MED ORDER — UMECLIDINIUM BROMIDE 62.5 MCG/ACT IN AEPB: 1.0000 | INHALATION_SPRAY | Freq: Every day | RESPIRATORY_TRACT | 1 refills | Status: DC

## 2023-07-04 ENCOUNTER — Ambulatory Visit: Payer: 59 | Admitting: Student

## 2023-07-04 ENCOUNTER — Encounter: Payer: Self-pay | Admitting: Student

## 2023-07-04 VITALS — BP 156/93 | HR 73 | Temp 98.2°F | Ht 65.0 in | Wt 172.0 lb

## 2023-07-04 DIAGNOSIS — F17210 Nicotine dependence, cigarettes, uncomplicated: Secondary | ICD-10-CM

## 2023-07-04 DIAGNOSIS — J40 Bronchitis, not specified as acute or chronic: Secondary | ICD-10-CM | POA: Diagnosis not present

## 2023-07-04 DIAGNOSIS — R21 Rash and other nonspecific skin eruption: Secondary | ICD-10-CM | POA: Diagnosis not present

## 2023-07-04 DIAGNOSIS — I10 Essential (primary) hypertension: Secondary | ICD-10-CM

## 2023-07-04 DIAGNOSIS — Z720 Tobacco use: Secondary | ICD-10-CM

## 2023-07-04 MED ORDER — LOSARTAN POTASSIUM 100 MG PO TABS: 100.0000 mg | ORAL_TABLET | Freq: Every day | ORAL | 3 refills | Status: DC

## 2023-07-04 MED ORDER — AZITHROMYCIN 250 MG PO TABS: ORAL_TABLET | ORAL | 0 refills | Status: AC

## 2023-07-04 MED ORDER — PANTOPRAZOLE SODIUM 20 MG PO TBEC: 20.0000 mg | DELAYED_RELEASE_TABLET | Freq: Every day | ORAL | 0 refills | Status: DC

## 2023-07-04 NOTE — Patient Instructions (Addendum)
Thank you, Mr.Jerry L Pai for allowing [place] to provide your care today. Today we discussed   I have ordered the following labs for you:  Lab Orders         BMP8+Anion Gap       Tests ordered today:  None  Referrals ordered today:   Referral Orders  No referral(s) requested today     I have ordered the following medication/changed the following medications:   Stop the following medications: Medications Discontinued During This Encounter  Medication Reason   losartan (COZAAR) 100 MG tablet Reorder   pantoprazole (PROTONIX) 20 MG tablet Reorder     Start the following medications: Meds ordered this encounter  Medications   pantoprazole (PROTONIX) 20 MG tablet    Sig: Take 1 tablet (20 mg total) by mouth daily.    Dispense:  90 tablet    Refill:  0   losartan (COZAAR) 100 MG tablet    Sig: Take 1 tablet (100 mg total) by mouth daily.    Dispense:  90 tablet    Refill:  3     Follow up: 4-6 weeks   Remember:   - Please sit up for at least 30 minutes after meals - Please avoid late night eating or drinking, especially acid meals (tomatoes, juice, apples, alcohol) - Please keep a blood pressure log with 2-3 blood pressures per week taken around the same time  - Please take the medications you have been prescribed today and STOP taking medications in your pill packets - We will continue to discuss smoking cessation at your next week - You will receive a call with your lab results  - You will pick up THREE medications at [facility] on [facility]: Pantoprazole for acid reflux, Losartan for your blood pressure, and Azithromycin (Z pack) for your bronchitis/wet cough. Please follow insert instructions and take consistently, we can discuss how you are doing in 4-6 weeks  Should you have any questions or concerns please call the internal medicine clinic at [phone].     Kellyn Mccary Colbert Coyer, MD PGY-1 Internal Medicine Teaching Pro[facility]

## 2023-07-04 NOTE — Progress Notes (Unsigned)
Established Patient Office Visit  Subjective   Patient ID: Lucas Stark, male    DOB: [birth year]  Age: 67 y.o. MRN: [id number]  Chief Complaint  Patient presents with   Follow-up    Patient is a 67 yo with a past medical history stated below who presents today for follow-up for cough, HTN, tobacco use, and rash. Please see problem based assessment and plan for additional details.      Past Medical History:  Diagnosis Date   Alcohol abuse 03/12/2016   Appendicitis 04/29/2021   CAD (coronary artery disease) cardiologist-- dr Jens Som   a. 03/2016 NSTEMI/PCI: LM nl, LAD nl, RI nl, LCX nl, OM2 99 ( 2.75 x 16 Synergy DES), RCA nl, EF 45-50%.   ETOH abuse    GERD (gastroesophageal reflux disease)    History of cocaine abuse (HCC)    per pt none since 2017   History of non-ST elevation myocardial infarction (NSTEMI) 03/14/2016   s/p  PCI and DES x1   Hyperlipidemia    Hypertension    Hypertensive heart disease    Ischemic cardiomyopathy    a. 03/2016 Echo: EF 35-40%, diff H, sev inflat HK, Gr2 DD;  b. 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   Gastrointestinal:  Positive for heartburn.  Skin:  Positive for itching.      Objective:     BP (!) 156/93 (BP Location: Right Arm, Patient Position: Sitting, Cuff Size: Normal)   Pulse 73   Temp 98.2 F (36.8 C) (Oral)   Ht 5\' 5"  (1.651 m)   Wt 172 lb (78 kg)   SpO2 98%   BMI 28.62 kg/m  BP Readings from Last 3 Encounters:  07/04/23 (!) 156/93  06/01/23 (!) 164/97  12/20/22 (!) 148/98   Wt Readings from Last 3 Encounters:  07/04/23 172 lb (78  kg)  06/01/23 172 lb (78 kg)  12/20/22 178 lb 9.6 oz (81 kg)      Physical Exam HENT:     Head: Normocephalic and atraumatic.     Mouth/Throat:     Mouth: Mucous membranes are moist.  Cardiovascular:     Rate and Rhythm: Normal rate and regular rhythm.     Pulses: Normal pulses.     Heart sounds: Normal heart sounds.  Pulmonary:     Effort: Pulmonary effort is normal.     Comments: Intermittent cough, upper airway congestion  Abdominal:     General: Bowel sounds are normal.     Palpations: Abdomen is soft.  Skin:    General: Skin is warm and dry.     Comments: Post-inflammatory hyperpigmentation around ankles/feet  Neurological:     General: No focal deficit present.     Mental Status: He is alert.  Psychiatric:        Mood and Affect: Mood normal.        Behavior: Behavior normal.    Results for orders placed or performed in visit on 07/04/23  BMP8+Anion Gap  Result Value Ref Range   Glucose 102 (H) 70 - 99 mg/dL   BUN 12 8 - 27 mg/dL   Creatinine, Ser 1.61 0.76 - 1.27 mg/dL   eGFR 76 >09 UE/AVW/0.98   BUN/Creatinine Ratio 11 10 - 24   Sodium 141 134 - 144 mmol/L   Potassium 4.8 3.5 - 5.2 mmol/L   Chloride 102 96 - 106 mmol/L   CO2 24 20 - 29 mmol/L   Anion Gap 15.0 10.0 - 18.0 mmol/L   Calcium 9.4 8.6 - 10.2 mg/dL  CBC no Diff  Result Value Ref Range   WBC 4.1 3.4 - 10.8 x10E3/uL   RBC 5.94 (H) 4.14 - 5.80 x10E6/uL   Hemoglobin 18.5 (H) 13.0 - 17.7 g/dL   Hematocrit 11.9 (H) 14.7 - 51.0 %   MCV 93 79 - 97 fL   MCH 31.1 26.6 - 33.0 pg   MCHC 33.3 31.5 - 35.7 g/dL   RDW 82.9 56.2 - 13.0 %   Platelets 206 150 - 450 x10E3/uL    Last metabolic panel Lab Results  Component Value Date   GLUCOSE 102 (H) 07/04/2023   NA 141 07/04/2023   K 4.8 07/04/2023   CL 102 07/04/2023   CO2 24 07/04/2023   BUN 12 07/04/2023   CREATININE 1.08 07/04/2023   EGFR 76 07/04/2023   CALCIUM 9.4 07/04/2023   PROT 6.8 11/19/2021   ALBUMIN 4.5 11/19/2021   LABGLOB 2.3  11/19/2021   AGRATIO 2.0 11/19/2021   BILITOT 0.6 11/19/2021   ALKPHOS 80 11/19/2021   AST 20 11/19/2021   ALT 11 11/19/2021   ANIONGAP 8 04/29/2021    The 10-year ASCVD risk score (Arnett DK, et al., 2019) is: 35%    Assessment & Plan:   Problem List Items Addressed This Visit       Cardiovascular and Mediastinum   Benign essential HTN    Patient's BP today 156/93, patient denies CP, headache, or vision changes. Patient's current medications include carvedilol 6.25 mg BID, chlorthalidone 25 mg daily, and losartan 100 mg daily. Patient states he has not been taking these medications for the past 3-4 weeks, which he has available as AM and PM pill packs. Patient reports he feels like he has less energy on those medications and does not like to take them before he has to go to work. Discussed the importance of taking these medications consistently to avoid complications associated with high blood pressure. Patient is willing to start on one antihypertensive agent and follow up in 4-6 weeks.  - START losartan 100 mg daily       Relevant Medications   losartan (COZAAR) 100 MG tablet     Respiratory   Bronchitis    Patient with a chronic cough. Recently cough has been wetter, worse at night. Feels like he has to spit up, not much sputum. Denies SOB or fevers, has upper airway congestion and intermittent coughing on exam. Active tobacco use. Will treat with short course of antibiotics.   Discussed importance of smoking cessation to avoid triggers, as well as good control of his acid reflux. He has not been taking his pantoprazole. Has been drinking alcohol and eating later in the day and lying down after. - START azithromycin Z pack (500 mg first day, 250 mg once daily days 2-5) - START pantoprazole 20 mg daily  - Reviewed recommendations to stay seated at least 30 minutes after a meal and avoid late night food/drink especially before going to bed -  Will follow up in 4-6 weeks, can call  for earlier appointment if cough worsens      Relevant Orders   CBC no Diff (Completed)     Musculoskeletal and Integument   Rash    Patient seen for rash last visit 9/19, now believes it was caused by poison ivy. Rash resolved, only mild pruritus along ankles/feet, which showed post-inflammatory hyperpigmentation and xerosis on exam. Had been using triamcinolone cream to affected areas as needed, can continue using as spot treatment. Patient not sure if he ever started taking the hydroxyzone 25 mg nightly for itch, doesn't think he needs it anymore.  - Recommend keeping skin moisturized to avoid dryness and reduce itch        Other   Tobacco abuse    Patient with history of tobacco use and COPD. Patient states he is still smoking cigarettes, about 1/2 a pack per day. Has tried Chantix in the past and has not been successful quitting. After discussing smoking cessation today, patient will think about a possible quit date.  - Will monitor and follow up at next visit       Other Visit Diagnoses     Primary hypertension    -  Primary   Relevant Medications   losartan (COZAAR) 100 MG tablet   Other Relevant Orders   BMP8+Anion Gap (Completed)      Return in about 4 weeks (around 08/01/2023) for 4-6 week follow up for BP, chronic cough, medication check .   Patient seen with Dr. Antony Contras.   Koni Kannan Colbert Coyer, MD

## 2023-07-05 DIAGNOSIS — J40 Bronchitis, not specified as acute or chronic: Secondary | ICD-10-CM | POA: Insufficient documentation

## 2023-07-05 LAB — CBC
Hematocrit: 55.5 % — ABNORMAL HIGH (ref 37.5–51.0)
Hemoglobin: 18.5 g/dL — ABNORMAL HIGH (ref 13.0–17.7)
MCH: 31.1 pg (ref 26.6–33.0)
MCHC: 33.3 g/dL (ref 31.5–35.7)
MCV: 93 fL (ref 79–97)
Platelets: 206 10*3/uL (ref 150–450)
RBC: 5.94 x10E6/uL — ABNORMAL HIGH (ref 4.14–5.80)
RDW: 13.6 % (ref 11.6–15.4)
WBC: 4.1 10*3/uL (ref 3.4–10.8)

## 2023-07-05 LAB — BMP8+ANION GAP
Anion Gap: 15 mmol/L (ref 10.0–18.0)
BUN/Creatinine Ratio: 11 (ref 10–24)
BUN: 12 mg/dL (ref 8–27)
CO2: 24 mmol/L (ref 20–29)
Calcium: 9.4 mg/dL (ref 8.6–10.2)
Chloride: 102 mmol/L (ref 96–106)
Creatinine, Ser: 1.08 mg/dL (ref 0.76–1.27)
Glucose: 102 mg/dL — ABNORMAL HIGH (ref 70–99)
Potassium: 4.8 mmol/L (ref 3.5–5.2)
Sodium: 141 mmol/L (ref 134–144)
eGFR: 76 mL/min/{1.73_m2} (ref >59)

## 2023-07-05 NOTE — Assessment & Plan Note (Signed)
Patient with history of tobacco use and COPD. Patient states he is still smoking cigarettes, about 1/2 a pack per day. Has tried Chantix in the past and has not been successful quitting. After discussing smoking cessation today, patient will think about a possible quit date.  - Will monitor and follow up at next visit

## 2023-07-05 NOTE — Assessment & Plan Note (Addendum)
Patient with a chronic cough. Recently cough has been wetter, worse at night. Feels like he has to spit up, not much sputum. Denies SOB or fevers, has upper airway congestion and intermittent coughing on exam. Active tobacco use. Will treat with short course of antibiotics.   Discussed importance of smoking cessation to avoid triggers, as well as good control of his acid reflux. He has not been taking his pantoprazole. Has been drinking alcohol and eating later in the day and lying down after. - START azithromycin Z pack (500 mg first day, 250 mg once daily days 2-5) - START pantoprazole 20 mg daily  - Reviewed recommendations to stay seated at least 30 minutes after a meal and avoid late night food/drink especially before going to bed - Will get CBC today  - Will follow up in 4-6 weeks, can call for earlier appointment if cough worsens

## 2023-07-05 NOTE — Assessment & Plan Note (Addendum)
Patient's BP today 156/93, patient denies CP, headache, or vision changes. Patient's current medications include carvedilol 6.25 mg BID, chlorthalidone 25 mg daily, and losartan 100 mg daily. Patient states he has not been taking these medications for the past 3-4 weeks, which he has available as AM and PM pill packs. Patient reports he feels like he has less energy on those medications and does not like to take them before he has to go to work. Discussed the importance of taking these medications consistently to avoid complications associated with high blood pressure. Patient is willing to start on one antihypertensive agent and follow up in 4-6 weeks.  - START losartan 100 mg daily  - Will get BMP today

## 2023-07-05 NOTE — Assessment & Plan Note (Signed)
Patient seen for rash last visit 9/19, now believes it was caused by poison ivy. Rash resolved, only mild pruritus along ankles/feet, which showed post-inflammatory hyperpigmentation and xerosis on exam. Had been using triamcinolone cream to affected areas as needed, can continue using as spot treatment. Patient not sure if he ever started taking the hydroxyzone 25 mg nightly for itch, doesn't think he needs it anymore.  - Recommend keeping skin moisturized to avoid dryness and reduce itch

## 2023-07-06 ENCOUNTER — Ambulatory Visit: Payer: 59 | Admitting: Neurology

## 2023-07-06 ENCOUNTER — Encounter: Payer: Self-pay | Admitting: Neurology

## 2023-07-06 VITALS — BP 140/92 | HR 68 | Ht 66.0 in | Wt 170.2 lb

## 2023-07-06 DIAGNOSIS — Z955 Presence of coronary angioplasty implant and graft: Secondary | ICD-10-CM | POA: Diagnosis not present

## 2023-07-06 DIAGNOSIS — E663 Overweight: Secondary | ICD-10-CM | POA: Diagnosis not present

## 2023-07-06 DIAGNOSIS — G4733 Obstructive sleep apnea (adult) (pediatric): Secondary | ICD-10-CM

## 2023-07-06 NOTE — Patient Instructions (Signed)
It was nice to see you again today.  As discussed, I will order a home sleep test to reevaluate your sleep apnea.  We will consider treatment with an AutoPap machine after your test if you still qualify for treatment.

## 2023-07-06 NOTE — Progress Notes (Signed)
Subjective:    Patient ID: Lucas Stark is a 67 y.o. male.  HPI    Huston Foley, MD, PhD [facility] [address] [address] [place], [place] [address]  Dear Dr. Versie Starks,   I saw your patient, Lucas Stark, upon your kind request in my sleep clinic today for evaluation of his prior diagnosis of obstructive sleep apnea.  The patient is unaccompanied today.  As you know, Lucas Stark is a 67 year old male with an underlying complex medical history of coronary artery disease with status post MI, ischemic cardiomyopathy, status post stent placement, arthritis, status post left partial knee replacement, alcohol use disorder, history of substance use disorder, hypertension, hyperlipidemia, smoking, and overweight state, who was previously diagnosed with obstructive sleep apnea.  He has not been on PAP therapy.  I had evaluated him over 3 years ago.  He does not report any significant sleep-related symptoms.  He is in fact not quite sure why he is here today.  His Epworth sleepiness score is 24, fatigue severity score is 9 out of 63.  I reviewed your office note from 06/01/2023.    His baseline polysomnogram from 11/11/2018 showed an AHI of 8/h, O2 nadir 83%.  He has had a little bit of weight loss since then.  Compared to 2020 he is about 7 pounds lighter.  He is not sure if he snores.  Bedtime varies anywhere between 4 and 7 PM.  He has a TV on in his bedroom and tends to stay on all night.  He lives with his significant other.  Rise time is around 7 AM.  He drinks caffeine in limitation, about half a cup of coffee per day, occasional tea, no soda daily.  He smokes half a pack per day.  He drinks alcohol daily and denies a history of alcohol use disorder.  He drinks about 2 servings of liquor, 8 ounce size each per day.  He has not used any illicit drugs in 5 or 6 years he states.    Previously:  11/27/2019: Lucas Stark is a 67 year old right-handed gentleman with an  underlying medical history of hypertension, smoking, coronary artery disease with history of non-STEMI in 2017 and history of stent placement, ischemic cardiomyopathy, history of substance abuse (per chart review) and overweight state, who presents for reevaluation of his obstructive sleep apnea.  The patient is unaccompanied today.  I first met him on 10/18/2018 at the request of his primary care physician, at which time he reported sleepiness and sleep paralysis.  He had a baseline sleep study on 11/11/2018 which showed a reduced sleep efficiency at 40.9%, sleep latency was markedly delayed, REM latency 48 minutes, REM percentage was normal at 19.3%.  Total AHI was 8/h, REM AHI was 18.5/h, O2 nadir was 83%.  He was advised to return for a CPAP titration study to treat his mild to moderate obstructive sleep apnea.  He did not return for a second study and was lost to follow-up.  He presents for reevaluation.   11/27/2019: he reports no new symptoms, continues to have sleep paralysis episodes when he sleeps during the day. He sleeps without a set schedule, and reports there is nothing to do.  His Epworth sleepiness score is 10 out of 24.  He may be in bed by 2 PM, may sleep all day, girlfriend works second shift.  He denies any symptoms of depression.  He does not work.  He has nocturia about once or twice  per average night.     10/18/18: (He) reports difficulty waking up, sleep paralysis, and excessive daytime somnolence. I reviewed your office note from 07/27/2018, which you kindly included.  Lives with GF, has been noted to snoring, he feels like he has to wake himself up. GF works 2nd shift.  His BT varies, no set time, rise time usually 5:30 or 6. No night to night nocturia, no AM HAs. He sees cardiology, has appt tomorrow, has had CP on the right recently, feels muscular to him, different from when he had MI. He has L partial knee replacement planned in March. No FHx of OSA. He smokes one pack per day, drinks  alcohol on the weekend, denies any illicit drug use and denies caffeine use. He is not aware of any family history of OSA.        His Past Medical History Is Significant For: Past Medical History:  Diagnosis Date   Alcohol abuse 03/12/2016   Appendicitis 04/29/2021   CAD (coronary artery disease) cardiologist-- dr Jens Som   a. 03/2016 NSTEMI/PCI: LM nl, LAD nl, RI nl, LCX nl, OM2 99 ( 2.75 x 16 Synergy DES), RCA nl, EF 45-50%.   ETOH abuse    GERD (gastroesophageal reflux disease)    History of cocaine abuse (HCC)    per pt none since 2017   History of non-ST elevation myocardial infarction (NSTEMI) 03/14/2016   s/p  PCI and DES x1   Hyperlipidemia    Hypertension    Hypertensive heart disease    Ischemic cardiomyopathy    a. 03/2016 Echo: EF 35-40%, diff H, sev inflat HK, Gr2 DD;  b. 03/2016 EF 45-50% by LV gram.;  echo 10-24-2018, ef 45-50%   Knee effusion, left 08/15/2018   Myocardial infarction [facility]) 2017   NSTEMI    OA (osteoarthritis)    left knee   Obstructive sleep apnea 06/01/2023   S/P drug eluting coronary stent placement 03/14/2016   DES x1 to OM1   Tobacco abuse    Wears dentures    upper   Wears partial dentures    lower    His Past Surgical History Is Significant For: Past Surgical History:  Procedure Laterality Date   CARDIAC CATHETERIZATION N/A 03/14/2016   Procedure: Left Heart Cath and Coronary Angiography;  Surgeon: Kathleene Hazel, MD;  Location[facility] INVASIVE CV LAB;  Service: Cardiovascular;  Laterality: N/A;   CORONARY ANGIOPLASTY WITH STENT PLACEMENT     FACIAL COSMETIC SURGERY     at age 49 yrs old   LACERATION REPAIR  age 26   face around eye   LAPAROSCOPIC APPENDECTOMY N/A 04/29/2021   Procedure: APPENDECTOMY LAPAROSCOPIC;  Surgeon: Axel Filler, MD;  Location: [facility] OR;  Service: General;  Laterality: N/A;   PARTIAL KNEE ARTHROPLASTY Left 11/13/2018   Procedure: UNICOMPARTMENTAL KNEE;  Surgeon: Sheral Apley, MD;  Location: [facility] ORS;   Service: Orthopedics;  Laterality: Left;   WRIST SURGERY Left 01/31/2020    His Family History Is Significant For: Family History  Problem Relation Age of Onset   Heart attack Father    Heart attack Sister    Colon polyps Maternal Uncle    Colon cancer Neg Hx    Esophageal cancer Neg Hx    Stomach cancer Neg Hx    Rectal cancer Neg Hx     His Social History Is Significant For: Social History   Socioeconomic History   Marital status: Legally Separated    Spouse name: Not on  file   Number of children: 8   Years of education: Not on file   Highest education level: Not on file  Occupational History   Not on file  Tobacco Use   Smoking status: Some Days    Current packs/day: 0.50    Average packs/day: 0.5 packs/day for 35.0 years (17.5 ttl pk-yrs)    Types: Cigarettes   Smokeless tobacco: Never   Tobacco comments:    some days less  6  per day   Vaping Use   Vaping status: Never Used  Substance and Sexual Activity   Alcohol use: Yes    Comment: 1/3 pint daily   Drug use: Not Currently    Types: Cocaine    Comment: 11-06-2018 per pt last cocaine "when I had my heart attack"  2017   Sexual activity: Not Currently  Other Topics Concern   Not on file  Social History Narrative   Caffiene 1/2 cup coffee daily   Retired:  [employer]      Social Determinants of Health   Financial Resource Strain: Low Risk  (11/22/2022)   Overall Financial Resource Strain (CARDIA)    Difficulty of Paying Living Expenses: Not very hard  Food Insecurity: Not on File (06/08/2023)   Received from [facility] Insecurity    Food: 0  Transportation Needs: No Transportation Needs (11/22/2022)   PRAPARE - Administrator, Civil Service (Medical): No    Lack of Transportation (Non-Medical): No  Physical Activity: Insufficiently Active (11/22/2022)   Exercise Vital Sign    Days of Exercise per Week: 1 day    Minutes of Exercise per Session: 10 min  Stress: No Stress Concern  Present (11/22/2022)   [employer] of Occupational Health - Occupational Stress Questionnaire    Feeling of Stress : Only a little  Social Connections: Not on File (05/27/2023)   Received from [employer]    Connectedness: 0    His Allergies Are:  Allergies  Allergen Reactions   Advil [Ibuprofen]     " I cant take, I had a stent put in, I have to take tylenol"   Other Itching    Prescription Sleeping pills, Unsure of the name of the sleeping pill   Oxycodone     Nervousness, sweaty, itchy  :   His Current Medications Are:  Outpatient Encounter Medications as of 07/06/2023  Medication Sig   acetaminophen (TYLENOL) 500 MG tablet Take 1,000 mg by mouth every 6 (six) hours as needed for moderate pain or headache.   albuterol (VENTOLIN HFA) 108 (90 Base) MCG/ACT inhaler Inhale 2 puffs into the lungs every 6 (six) hours as needed for wheezing or shortness of breath.   aspirin EC 81 MG tablet Take 81 mg by mouth daily. Swallow whole.   azithromycin (ZITHROMAX Z-PAK) 250 MG tablet Take 2 tablets (500 mg) on  Day 1,  followed by 1 tablet (250 mg) once daily on Days 2 through 5.   nitroGLYCERIN (NITROSTAT) 0.4 MG SL tablet Place 1 tablet (0.4 mg total) under the tongue every 5 (five) minutes as needed for chest pain.   pantoprazole (PROTONIX) 20 MG tablet Take 1 tablet (20 mg total) by mouth daily.   umeclidinium bromide (INCRUSE ELLIPTA) 62.5 MCG/ACT AEPB Inhale 1 puff into the lungs daily.   Bempedoic Acid (NEXLETOL) 180 MG TABS Take 1 tablet (180 mg total) by mouth daily. (Patient not taking: Reported on 07/06/2023)   carvedilol (COREG) 6.25  MG tablet Take 1 tablet (6.25 mg total) by mouth 2 (two) times daily. (Patient not taking: Reported on 07/06/2023)   chlorthalidone (HYGROTON) 25 MG tablet Take 0.5 tablets (12.5 mg total) by mouth daily. (Patient not taking: Reported on 07/06/2023)   ezetimibe (ZETIA) 10 MG tablet Take 1 tablet (10 mg total) by mouth daily. (Patient  not taking: Reported on 07/06/2023)   losartan (COZAAR) 100 MG tablet Take 1 tablet (100 mg total) by mouth daily. (Patient not taking: Reported on 07/06/2023)   nicotine (NICODERM CQ - DOSED IN MG/24 HOURS) 21 mg/24hr patch Place 1 patch (21 mg total) onto the skin daily. (Patient not taking: Reported on 07/06/2023)   nicotine polacrilex (NICORETTE) 4 MG gum Take 1 each (4 mg total) by mouth as needed for smoking cessation. (Patient not taking: Reported on 07/06/2023)   rosuvastatin (CRESTOR) 40 MG tablet Take 1 tablet (40 mg total) by mouth daily. (Patient not taking: Reported on 07/06/2023)   sildenafil (REVATIO) 20 MG tablet SMARTSIG:1-3 Tablet(s) By Mouth (Patient not taking: Reported on 07/06/2023)   triamcinolone (KENALOG) 0.025 % ointment Apply 1 Application topically 2 (two) times daily. (Patient not taking: Reported on 07/06/2023)   No facility-administered encounter medications on file as of 07/06/2023.  :   Review of Systems:  Out of a complete 14 point review of systems, all are reviewed and negative with the exception of these symptoms as listed below:  Review of Systems  Neurological:        Internal / Hx OSA, not on CPAP - morning headaches, fatigue, and that sensation of waking up choking at night.  ESS 5 FSS 9.  Pt had SS. Said did not get results or call about machine.      Objective:  Neurological Exam  Physical Exam Physical Examination:   Vitals:   07/06/23 1433  BP: (!) 140/92  Pulse: 68  SpO2: 95%    General Examination: The patient is a very pleasant 67 y.o. male in no acute distress. He appears well-developed and well-nourished and well groomed.   HEENT: Normocephalic, atraumatic, pupils are equal, round and reactive to light, tracking well-preserved, hearing grossly intact.  Face is symmetric with normal facial animation.  Speech is clear without dysarthria.  Airway examination reveals mild to moderate mouth dryness, dentures on top and partials on the  bottom, moderate airway crowding secondary to to small airway entry and redundant soft palate.  Tonsils on the smaller side.  Neck circumference 15-1/4 inches.  Tongue protrudes centrally and palate elevates symmetrically.     Chest: Clear to auscultation without wheezing, rhonchi or crackles noted.   Heart: S1+S2+0, regular and normal without murmurs, rubs or gallops noted.    Abdomen: Soft, non-tender and non-distended.   Extremities: There is no obvious edema in the distal lower extremities bilaterally.   Skin: Warm and dry without trophic changes noted.    Musculoskeletal: exam reveals no obvious joint deformities.     Neurologically:  Mental status: The patient is awake, alert and oriented in all 4 spheres. His immediate and remote memory, attention, language skills and fund of knowledge are appropriate. There is no evidence of aphasia, agnosia, apraxia or anomia. Speech is clear with normal prosody and enunciation. Thought process is linear. Mood is normal and affect is normal.  Cranial nerves II - XII are as described above under HEENT exam. In addition: shoulder shrug is normal with equal shoulder height noted. Motor exam: Normal bulk, strength and tone is noted. There  is no obvious resting or postural or action tremor.  Fine motor skills and coordination: intact grossly.  Cerebellar testing: No dysmetria or intention tremor. There is no truncal or gait ataxia.  Sensory exam: intact to light touch in the upper and lower extremities.  Gait, station and balance: He stands easily. No veering to one side is noted. No leaning to one side is noted. Posture is age-appropriate and stance is narrow based. Gait shows normal stride length and normal pace. No problems turning are noted.     Assessment and Plan:  In summary, CRECENCIO CUPPETT is a 67 year old male with an underlying complex medical history of coronary artery disease with status post MI, ischemic cardiomyopathy, status post stent  placement, arthritis, status post left partial knee replacement, alcohol use disorder, history of substance use disorder, hypertension, hyperlipidemia, smoking, and overweight state, who presents for evaluation of his obstructive sleep apnea.  He was diagnosed with overall mild obstructive sleep apnea over 3-1/2 years ago but has not been on PAP therapy.  He would be willing to get retested.  We mutually agreed to proceed with a home sleep test at this time.  I explained the differences between a laboratory attended sleep study versus home sleep test. We talked about the importance of lifestyle modification.  He is in particular advised to work on smoking cessation and alcohol reduction.  He is advised to keep a set schedule for his sleep and avoid watching TV in his bedroom.  We will be in touch once we have insurance approval for home sleep testing and keep him posted as to his test results by phone call after I have looked at the results.  We will consider AutoPap therapy. I answered all his questions today and he was in agreement.  Thank you very much for allowing me to participate in the care of this nice patient. If I can be of any further assistance to you please do not hesitate to call me at [phone].  Sincerely,   Huston Foley, MD, PhD

## 2023-07-07 NOTE — Progress Notes (Signed)
Internal Medicine Clinic Attending  I was physically present during the key portions of the resident provided service and participated in the medical decision making of patient's management care. I reviewed pertinent patient test results.  The assessment, diagnosis, and plan were formulated together and I agree with the documentation in the resident's note.  Acute on chronic cough in a smoker with suspected but undiagnosed COPD; we are treating for bronchitis with a Z-pack. Will need PFTs at some point. Encouraged smoking cessation.   Reymundo Poll, MD

## 2023-07-11 ENCOUNTER — Other Ambulatory Visit: Payer: Self-pay | Admitting: Student

## 2023-07-11 MED ORDER — BENZONATATE 100 MG PO CAPS: 100.0000 mg | ORAL_CAPSULE | Freq: Three times a day (TID) | ORAL | 0 refills | Status: DC | PRN

## 2023-07-11 MED ORDER — GUAIFENESIN 100 MG/5ML PO LIQD: 5.0000 mL | ORAL | 0 refills | Status: DC | PRN

## 2023-07-11 MED ORDER — VARENICLINE TARTRATE (STARTER) 0.5 MG X 11 & 1 MG X 42 PO TBPK: ORAL_TABLET | ORAL | 0 refills | Status: AC

## 2023-07-17 ENCOUNTER — Telehealth: Payer: Self-pay

## 2023-07-17 NOTE — Telephone Encounter (Signed)
Patient called he is requesting a call back regarding his lab results. Please return patients call.

## 2023-07-26 ENCOUNTER — Telehealth: Payer: Self-pay | Admitting: Neurology

## 2023-07-26 NOTE — Telephone Encounter (Signed)
HST - [employer] medicare/medicaid no auth req   Patient is scheduled at [facility] for 08/15/23 at 9 AM.  Mailed packet to the patient.

## 2023-07-31 ENCOUNTER — Telehealth: Payer: Self-pay | Admitting: *Deleted

## 2023-07-31 NOTE — Telephone Encounter (Signed)
Call from patient requesting to only speak with Dr. Versie Starks.  Patient was asked what it was concerning. Wanted to have doctor call him . Stated had to do with some abdominal pain he is having.  Is having no discharge currently.  Stated was given ASA before. Then said ws given 2 pills.  Stated  when asked if it was an antibiotic. Could not remember what it was.  Was offered an appointment stated did not want to wait.  Patient was then given an appointment with Dr. Versie Starks for 08/02/2023 at 10:15 AM.   Patient was informed that  if we have a opening we would call him.  Patient stated would like to get an appointment after 12 pm today or tomorrow if there is an opening.  t

## 2023-08-01 ENCOUNTER — Encounter: Payer: Self-pay | Admitting: Student

## 2023-08-01 ENCOUNTER — Other Ambulatory Visit (HOSPITAL_COMMUNITY)
Admission: RE | Admit: 2023-08-01 | Discharge: 2023-08-01 | Disposition: A | Discharge status: Home / Self Care | Payer: 59 | Source: Ambulatory Visit | Attending: Internal Medicine | Admitting: Internal Medicine

## 2023-08-01 ENCOUNTER — Ambulatory Visit (INDEPENDENT_AMBULATORY_CARE_PROVIDER_SITE_OTHER): Payer: 59 | Admitting: Student

## 2023-08-01 VITALS — BP 158/100 | HR 76 | Temp 98.7°F | Ht 66.0 in | Wt 174.5 lb

## 2023-08-01 DIAGNOSIS — Z113 Encounter for screening for infections with a predominantly sexual mode of transmission: Secondary | ICD-10-CM | POA: Insufficient documentation

## 2023-08-01 DIAGNOSIS — Z7251 High risk heterosexual behavior: Secondary | ICD-10-CM

## 2023-08-01 DIAGNOSIS — I10 Essential (primary) hypertension: Secondary | ICD-10-CM | POA: Diagnosis not present

## 2023-08-01 MED ORDER — AZITHROMYCIN 500 MG PO TABS
1000.0000 mg | ORAL_TABLET | Freq: Once | ORAL | Status: AC
  Administered 2023-08-01: 1000 mg via ORAL

## 2023-08-01 NOTE — Assessment & Plan Note (Deleted)
Blood pressure today is 158/100.  He is not currently taking his medications: carvedilol 6.25 mg twice daily, chlorthalidone 25 mg daily, and losartan 100 mg daily.  At the last visit, it was decided to focus on taking just 1 of those medications, losartan 100 mg daily.

## 2023-08-01 NOTE — Assessment & Plan Note (Addendum)
Patient reports being sexually active with 2 male partners.  He has frequent unprotected sex.  Recently, one of the partners tested positive for chlamydia, so he would like to have STI screening today.  He denies any genitourinary symptoms.  On exam, no discharge, swollen lymph nodes, or other abnormalities were noted.  He denies any dysuria.  He does report some general abdominal cramping, which he attributes to nervousness.  He has had no nausea, vomiting, fever, or change in bowel movements.  I agree that it is most likely anxiety-related and not related to an STI.  We will test him today for HIV, syphilis, chlamydia, gonorrhea, and trichomonas.  Since he was adamant about receiving some treatment while he was here, we gave him 1 g of azithromycin. We also counseled him about the importance of abstaining from unprotected sexual activity until his results are back.

## 2023-08-01 NOTE — Addendum Note (Signed)
Addended by: Maura Crandall on: 08/01/2023 12:48 PM   Modules accepted: Orders

## 2023-08-01 NOTE — Progress Notes (Signed)
CC: STI screening  HPI:  Lucas Stark is a 67 y.o. male living with a history stated below and presents today for STI screening. Please see problem based assessment and plan for additional details.  Past Medical History:  Diagnosis Date   Alcohol abuse 03/12/2016   Appendicitis 04/29/2021   CAD (coronary artery disease) cardiologist-- dr Jens Som   a. 03/2016 NSTEMI/PCI: LM nl, LAD nl, RI nl, LCX nl, OM2 99 ( 2.75 x 16 Synergy DES), RCA nl, EF 45-50%.   ETOH abuse    GERD (gastroesophageal reflux disease)    History of cocaine abuse (HCC)    per pt none since 2017   History of non-ST elevation myocardial infarction (NSTEMI) 03/14/2016   s/p  PCI and DES x1   Hyperlipidemia    Hypertension    Hypertensive heart disease    Ischemic cardiomyopathy    a. 03/2016 Echo: EF 35-40%, diff H, sev inflat HK, Gr2 DD;  b. 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     albuterol (VENTOLIN HFA) 108 (90 Base) MCG/ACT inhaler Inhale 2 puffs into the lungs every 6 (six) hours as needed for wheezing or shortness of breath. 8 g 2   aspirin EC 81 MG tablet Take 81 mg by mouth daily. Swallow whole.     Bempedoic Acid (NEXLETOL) 180 MG TABS Take 1 tablet (180 mg total) by mouth daily. (Patient not taking: Reported on 07/06/2023) 90 tablet 0   benzonatate (TESSALON PERLES) 100 MG capsule Take 1 capsule (100 mg total) by mouth 3 (three) times daily as needed for cough. 20  capsule 0   carvedilol (COREG) 6.25 MG tablet Take 1 tablet (6.25 mg total) by mouth 2 (two) times daily. (Patient not taking: Reported on 07/06/2023) 180 tablet 3   chlorthalidone (HYGROTON) 25 MG tablet Take 0.5 tablets (12.5 mg total) by mouth daily. (Patient not taking: Reported on 07/06/2023) 45 tablet 3   ezetimibe (ZETIA) 10 MG tablet Take 1 tablet (10 mg total) by mouth daily. (Patient not taking: Reported on 07/06/2023) 90 tablet 3   guaiFENesin (ROBITUSSIN) 100 MG/5ML liquid Take 5 mLs by mouth every 4 (four) hours as needed for cough or to loosen phlegm. 120 mL 0   losartan (COZAAR) 100 MG tablet Take 1 tablet (100 mg total) by mouth daily. (Patient not taking: Reported on 07/06/2023) 90 tablet 3   nicotine (NICODERM CQ - DOSED IN MG/24 HOURS) 21 mg/24hr patch Place 1 patch (21 mg total) onto the skin daily. (Patient not taking: Reported on 07/06/2023) 28 patch 0   nicotine polacrilex (NICORETTE) 4 MG gum Take 1 each (4 mg total) by mouth as needed for smoking cessation. (Patient not taking: Reported on 07/06/2023) 100 tablet 1   nitroGLYCERIN (NITROSTAT) 0.4 MG SL tablet Place 1 tablet (0.4 mg total) under the tongue every 5 (  five) minutes as needed for chest pain. 25 tablet 3   pantoprazole (PROTONIX) 20 MG tablet Take 1 tablet (20 mg total) by mouth daily. 90 tablet 0   rosuvastatin (CRESTOR) 40 MG tablet Take 1 tablet (40 mg total) by mouth daily. (Patient not taking: Reported on 07/06/2023) 90 tablet 3   sildenafil (REVATIO) 20 MG tablet SMARTSIG:1-3 Tablet(s) By Mouth (Patient not taking: Reported on 07/06/2023)     triamcinolone (KENALOG) 0.025 % ointment Apply 1 Application topically 2 (two) times daily. (Patient not taking: Reported on 07/06/2023) 30 g 0   umeclidinium bromide (INCRUSE ELLIPTA) 62.5 MCG/ACT AEPB Inhale 1 puff into the lungs daily. 1 each 1   Varenicline Tartrate, Starter, (CHANTIX STARTING MONTH PAK) 0.5 MG X 11 & 1 MG X 42 TBPK Take 0.5 mg by mouth daily for 3  days, THEN 0.5 mg in the morning and at bedtime for 3 days, THEN 1 mg in the morning and at bedtime for 28 days. 1 each 0   No current facility-administered medications on file prior to visit.    Family History  Problem Relation Age of Onset   Heart attack Father    Heart attack Sister    Colon polyps Maternal Uncle    Colon cancer Neg Hx    Esophageal cancer Neg Hx    Stomach cancer Neg Hx    Rectal cancer Neg Hx     Social History   Socioeconomic History   Marital status: Legally Separated    Spouse name: Not on file   Number of children: 8   Years of education: Not on file   Highest education level: Not on file  Occupational History   Not on file  Tobacco Use   Smoking status: Some Days    Current packs/day: 0.50    Average packs/day: 0.5 packs/day for 35.0 years (17.5 ttl pk-yrs)    Types: Cigarettes   Smokeless tobacco: Never   Tobacco comments:    some days less  6  per day   Vaping Use   Vaping status: Never Used  Substance and Sexual Activity   Alcohol use: Yes    Comment: 1/3 pint daily   Drug use: Not Currently    Types: Cocaine    Comment: 11-06-2018 per pt last cocaine "when I had my heart attack"  2017   Sexual activity: Not Currently  Other Topics Concern   Not on file  Social History Narrative   Caffiene 1/2 cup coffee daily   Retired:  [employer]      Social Determinants of Health   Financial Resource Strain: Low Risk  (11/22/2022)   Overall Financial Resource Strain (CARDIA)    Difficulty of Paying Living Expenses: Not very hard  Food Insecurity: Not on File (06/08/2023)   Received from [facility] Insecurity    Food: 0  Transportation Needs: No Transportation Needs (11/22/2022)   PRAPARE - Administrator, Civil Service (Medical): No    Lack of Transportation (Non-Medical): No  Physical Activity: Insufficiently Active (11/22/2022)   Exercise Vital Sign    Days of Exercise per Week: 1 day    Minutes of Exercise per Session:  10 min  Stress: No Stress Concern Present (11/22/2022)   [employer] of Occupational Health - Occupational Stress Questionnaire    Feeling of Stress : Only a little  Social Connections: Not on File (05/27/2023)   Received from [facility]   Social Connections    Connectedness: 0  Intimate Partner Violence: Not At Risk (11/22/2022)   Humiliation, Afraid, Rape, and Kick questionnaire    Fear of Current or Ex-Partner: No    Emotionally Abused: No    Physically Abused: No    Sexually Abused: No    Review of Systems: ROS negative except for what is noted on the assessment and plan.  Vitals:   08/01/23 1026  BP: (!) 158/100  Pulse: 76  Temp: 98.7 F (37.1 C)  TempSrc: Oral  SpO2: 98%  Weight: 174 lb 8 oz (79.2 kg)  Height: 5\' 6"  (1.676 m)   Physical Exam: Genital: no penile discharge, ulcerations; no swollen lymph nodes palpated. Testicles and penis grossly normal with no tenderness to palpation.  Psych: nervous affect  Assessment & Plan:   Patient seen with Dr. Cleda Daub  High-risk sexual behavior Patient reports being sexually active with 2 male partners.  He has frequent unprotected sex.  Recently, one of the partners tested positive for chlamydia, so he would like to have STI screening today.  He denies any genitourinary symptoms.  On exam, no discharge, swollen lymph nodes, or other abnormalities were noted.  He denies any dysuria.  He does report some general abdominal cramping, which he attributes to nervousness.  He has had no nausea, vomiting, fever, or change in bowel movements.  I agree that it is most likely anxiety-related and not related to an STI.  We will test him today for HIV, syphilis, chlamydia, gonorrhea, and trichomonas.  Since he was adamant about receiving some treatment while he was here, we gave him 1 g of azithromycin. We also counseled him about the importance of abstaining from unprotected sexual activity until his results are back.   Annett Fabian, MD   [facility] Internal Medicine, PGY-1 Phone: [phone] Date 08/01/2023 Time 11:30 AM

## 2023-08-01 NOTE — Patient Instructions (Addendum)
Thank you, [name] for allowing [place] to provide your care today. Today we discussed your STI screening.  As we discussed, we have collected blood and urine today for STI screening (HIV, Syphilis, Chlamydia, Gonorrhea). I will call you as soon as I have the results. In the meantime, we gave you 1g Azithromycin to start your treatment.   We will follow up on your blood pressure and other medical conditions at your next appointment.   Follow up: 2 weeks  We look forward to seeing you next time. Please call our clinic at [phone] if you have any questions or concerns. The best time to call is Monday-Friday from 9am-4pm, but there is someone available 24/7. If after hours or the weekend, call the main hospital number and ask for the Internal Medicine Resident On-Call. If you need medication refills, please notify your pharmacy one week in advance and they will send [place] a request.   Thank you for trusting me with your care. Wishing you the best!   Annett Fabian, MD [facility]

## 2023-08-02 ENCOUNTER — Encounter: Payer: 59 | Admitting: Student

## 2023-08-02 LAB — URINE CYTOLOGY ANCILLARY ONLY
Chlamydia: NEGATIVE
Comment: NEGATIVE
Comment: NEGATIVE
Comment: NORMAL
Neisseria Gonorrhea: NEGATIVE
Trichomonas: NEGATIVE

## 2023-08-03 ENCOUNTER — Telehealth: Payer: Self-pay

## 2023-08-03 NOTE — Telephone Encounter (Signed)
Requesting lab results, please call pt back.  

## 2023-08-04 ENCOUNTER — Other Ambulatory Visit: Payer: Self-pay | Admitting: Student

## 2023-08-04 ENCOUNTER — Ambulatory Visit: Payer: 59 | Admitting: *Deleted

## 2023-08-04 DIAGNOSIS — A539 Syphilis, unspecified: Secondary | ICD-10-CM | POA: Diagnosis not present

## 2023-08-04 LAB — RPR, QUANT+TP ABS (REFLEX)
Rapid Plasma Reagin, Quant: 1:1 {titer} — ABNORMAL HIGH
T Pallidum Abs: REACTIVE — AB

## 2023-08-04 LAB — RPR: RPR Ser Ql: REACTIVE — AB

## 2023-08-04 LAB — HIV ANTIBODY (ROUTINE TESTING W REFLEX): HIV Screen 4th Generation wRfx: NONREACTIVE

## 2023-08-04 MED ORDER — PENICILLIN G BENZATHINE 2400000 UNIT/4ML IM SUSY
2.4000 10*6.[IU] | PREFILLED_SYRINGE | Freq: Once | INTRAMUSCULAR | Status: AC
  Administered 2023-08-04: 2400000 [IU] via INTRAMUSCULAR

## 2023-08-04 NOTE — Progress Notes (Signed)
Spoke with the patient about his syphilis test results.  His RPR was reactive, titer was 1:1, and treponemal antibodies were positive.  He had a prior syphilis infection back in 2010, which he states was treated, however I cannot find any evidence of treatment in the chart.  I also do not see any repeat testing to confirm clearance of the infection.  He does continue to engage in sexual behavior which may put him at risk for sexually transmitted infections.  He is not having any neurological symptoms.  He did present with a rash back in September, which was nonspecific and was treated symptomatically given the absence of any other concerning symptoms.  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  I discussed the lab results and the risk-benefits of treatment with him, and we have decided to go ahead and treat with IM penicillin G today. I will fax the reportable diseases form to the [facility] Department.

## 2023-08-08 NOTE — Progress Notes (Signed)
Internal Medicine Clinic Attending  I was physically present during the key portions of the resident provided service and participated in the medical decision making of patient's management care. I reviewed pertinent patient test results.  The assessment, diagnosis, and plan were formulated together and I agree with the documentation in the resident's note.  Gust Rung, DO

## 2023-08-14 ENCOUNTER — Encounter: Payer: 59 | Admitting: Student

## 2023-08-15 ENCOUNTER — Ambulatory Visit: Payer: 59 | Admitting: Neurology

## 2023-08-15 DIAGNOSIS — G4733 Obstructive sleep apnea (adult) (pediatric): Secondary | ICD-10-CM | POA: Diagnosis not present

## 2023-08-15 DIAGNOSIS — E663 Overweight: Secondary | ICD-10-CM

## 2023-08-15 DIAGNOSIS — Z955 Presence of coronary angioplasty implant and graft: Secondary | ICD-10-CM

## 2023-08-16 NOTE — Progress Notes (Signed)
See procedure note.

## 2023-08-16 NOTE — Addendum Note (Signed)
Addended by: Huston Foley on: 08/16/2023 06:16 PM   Modules accepted: Orders

## 2023-08-16 NOTE — Procedures (Signed)
[facility]  HOME SLEEP TEST (Watch PAT) REPORT  STUDY DATE: 08/16/2023  DOB: 06/05/1956  MRN: [id number]  ORDERING CLINICIAN: Huston Foley, MD, PhD   REFERRING CLINICIAN: Annett Fabian, MD   CLINICAL INFORMATION/HISTORY: 67 year old male with an underlying complex medical history of coronary artery disease with status post MI, ischemic cardiomyopathy, status post stent placement, arthritis, status post left partial knee replacement, alcohol use disorder, history of substance use disorder, hypertension, hyperlipidemia, smoking, and overweight state, who was previously diagnosed with obstructive sleep apnea. He has not been on PAP therapy.   Epworth sleepiness score: 5/24.  BMI: 27.5 kg/m  FINDINGS:   Sleep Summary:   Total Recording Time (hours, min): 7 hours, 53 min  Total Sleep Time (hours, min):  6 hours, 52 min  Percent REM (%):    30.6%   Respiratory Indices:   Calculated pAHI (per hour):  16.2/hour (utilizing the 4% desaturation criteria for hypopneas per Medicare guidelines)         REM pAHI:    15.2/hour       NREM pAHI: 16.6/hour  Central pAHI: 4.3/hour  Oxygen Saturation Statistics:    Oxygen Saturation (%) Mean: 92%   Minimum oxygen saturation (%):                 82%   O2 Saturation Range (%): 74 (artifact) - 97%    O2 Saturation (minutes) <=88%: 0.1 min  Pulse Rate Statistics:   Pulse Mean (bpm):    70/min    Pulse Range (51 - 100/min)   IMPRESSION: OSA (obstructive sleep apnea)   RECOMMENDATION:  This home sleep test demonstrates moderate obstructive sleep apnea with a total AHI of 16.2/hour and O2 nadir of 82%.  Intermittent mild to moderate snoring was detected. Treatment with a positive airway pressure (PAP) device is recommended. The patient will be advised to proceed with an autoPAP titration/trial at home for now. A full night titration study may be considered to optimize treatment settings, monitor proper oxygen  saturations and aid with improvement of tolerance and adherence, if needed down the road. Alternative treatment options - generally speaking - may include a dental device (an appropriate candidates) through dentistry or orthodontics in selected patients or Inspire (hypoglossal nerve stimulator) in carefully selected patients (meeting inclusion criteria).  Concomitant weight loss is recommended (where clinically appropriate). Please note that untreated obstructive sleep apnea may carry additional perioperative morbidity. Patients with significant obstructive sleep apnea should receive perioperative PAP therapy and the surgeons and particularly the anesthesiologist should be informed of the diagnosis and the severity of the sleep disordered breathing. The patient should be cautioned not to drive, work at heights, or operate dangerous or heavy equipment when tired or sleepy. Review and reiteration of good sleep hygiene measures should be pursued with any patient. Other causes of the patient's symptoms, including circadian rhythm disturbances, an underlying mood disorder, medication effect and/or an underlying medical problem cannot be ruled out based on this test. Clinical correlation is recommended.  The patient and his referring provider will be notified of the test results. The patient will be seen in follow up in sleep clinic at [facility].  I certify that I have reviewed the raw data recording prior to the issuance of this report in accordance with the standards of the American Academy of Sleep Medicine (AASM).    INTERPRETING PHYSICIAN:   Huston Foley, MD, PhD Medical Director, [facility]) Diplomat, ABPN (Neurology and Sleep)  [facility] [address] [place], [place] [address] [phone]

## 2023-08-17 ENCOUNTER — Other Ambulatory Visit (HOSPITAL_COMMUNITY)
Admission: RE | Admit: 2023-08-17 | Discharge: 2023-08-17 | Disposition: A | Discharge status: Home / Self Care | Payer: 59 | Source: Ambulatory Visit | Attending: Internal Medicine | Admitting: Internal Medicine

## 2023-08-17 ENCOUNTER — Ambulatory Visit (INDEPENDENT_AMBULATORY_CARE_PROVIDER_SITE_OTHER): Payer: 59 | Admitting: Student

## 2023-08-17 ENCOUNTER — Encounter: Payer: Self-pay | Admitting: Student

## 2023-08-17 ENCOUNTER — Other Ambulatory Visit: Payer: Self-pay | Admitting: Student

## 2023-08-17 VITALS — BP 137/102 | HR 72 | Temp 98.0°F | Ht 66.0 in | Wt 172.8 lb

## 2023-08-17 DIAGNOSIS — I10 Essential (primary) hypertension: Secondary | ICD-10-CM

## 2023-08-17 DIAGNOSIS — F17210 Nicotine dependence, cigarettes, uncomplicated: Secondary | ICD-10-CM

## 2023-08-17 DIAGNOSIS — G4733 Obstructive sleep apnea (adult) (pediatric): Secondary | ICD-10-CM

## 2023-08-17 DIAGNOSIS — Z113 Encounter for screening for infections with a predominantly sexual mode of transmission: Secondary | ICD-10-CM | POA: Insufficient documentation

## 2023-08-17 DIAGNOSIS — Z1159 Encounter for screening for other viral diseases: Secondary | ICD-10-CM | POA: Diagnosis not present

## 2023-08-17 DIAGNOSIS — Z7251 High risk heterosexual behavior: Secondary | ICD-10-CM | POA: Diagnosis not present

## 2023-08-17 DIAGNOSIS — Z720 Tobacco use: Secondary | ICD-10-CM

## 2023-08-17 MED ORDER — VARENICLINE TARTRATE 1 MG PO TABS: 1.0000 mg | ORAL_TABLET | Freq: Two times a day (BID) | ORAL | 1 refills | Status: DC

## 2023-08-17 MED ORDER — AMLODIPINE BESYLATE 5 MG PO TABS: 5.0000 mg | ORAL_TABLET | Freq: Every day | ORAL | 11 refills | Status: DC

## 2023-08-17 MED ORDER — VARENICLINE TARTRATE 0.5 MG PO TABS: 0.5000 mg | ORAL_TABLET | Freq: Two times a day (BID) | ORAL | 0 refills | Status: AC

## 2023-08-17 NOTE — Patient Instructions (Signed)
Thank you, Lucas Stark for allowing [place] to provide your care today. Today we discussed your STI testing, blood pressure, smoking cessation, and sleep apnea.   As we discussed, your blood pressure was elevated today.  I recommended adding an additional blood pressure medicine called amlodipine, which you should take once daily.  Please also continue taking the losartan 100 mg daily.  Bring your blood pressure cuff to the next visit, so we can make sure that you are taking your blood pressure correctly at home.  For your sleep apnea, I spoke with you about the results of your sleep study test.  The sleep medicine doctors have already put in an order for a CPAP machine, which you should wear every night.  This is very important for your blood pressure and your heart health.  They should be calling you to schedule a time for you to get the machine set up.  For your smoking cessation, I will prescribe you a medication called Chantix and nicotine gum.  You do not need to chew the nicotine gum continuously, but can chew it a few times and hold it in the cheek of your mouth.  This should help you reduce the cravings when you feel the urge to smoke.  For the Chantix medication (to help you quit smoking), take it as follows:  Days 1 to 3: 0.5 mg (1 tablet) once daily.  Days 4 to 7: 0.5 mg (1 tablet) twice daily.  From Day 8 on: 1 mg (1 tablet) twice daily.  Set a smoking quit date in ~3 weeks after starting this medication. Then do your best to quit on that day, using the nicotine gum as needed.   For your sexually transmitted infection screening, we collected labs. We reviewed your previous records and saw that you had tested positive for Syphilis 14 years ago. We were unable to find any treatment or retesting to confirm that the infection was treated in our records. When we rechecked your labs, your Syphilis test showed that there is a chance that your previous infection was not completely treated, so  we retreated you with an injection of Penicillin. We have collected labs to confirm you have been adequately treated and will contact you as soon as we have the results.   I have ordered the following medication/changed the following medications:   Start the following medications: Meds ordered this encounter  Medications   amLODipine (NORVASC) 5 MG tablet    Sig: Take 1 tablet (5 mg total) by mouth daily.    Dispense:  30 tablet    Refill:  11   varenicline (CHANTIX) 0.5 MG tablet    Sig: Take 1 tablet (0.5 mg total) by mouth 2 (two) times daily for 11 doses. For the first three days, take 1 tablet (0.5 mg) daily. For days 4-7, take 1 tablet twice daily (1 mg total). After this, switch to the 1 mg tablets twice daily.    Dispense:  11 tablet    Refill:  0   varenicline (CHANTIX CONTINUING MONTH PAK) 1 MG tablet    Sig: Take 1 tablet (1 mg total) by mouth 2 (two) times daily.    Dispense:  60 tablet    Refill:  1     Follow up: 4 weeks  We look forward to seeing you next time. Please call our clinic at [phone] if you have any questions or concerns. The best time to call is Monday-Friday from 9am-4pm, but there is  someone available 24/7. If after hours or the weekend, call the main hospital number and ask for the Internal Medicine Resident On-Call. If you need medication refills, please notify your pharmacy one week in advance and they will send [place] a request.   Thank you for trusting me with your care. Wishing you the best!   Annett Fabian, MD [facility]

## 2023-08-17 NOTE — Assessment & Plan Note (Addendum)
Patient has a history of hypertension. Blood pressure today is 161/94, 137/102 on repeat. He is currently taking losartan 100 mg daily; his carvedilol 6.25 mg BID and chlorthalidone 25 mg were stopped at his last visit because he felt they were making him feel strangely and was not taking them regularly as a result. Given his blood pressure, he will need more than one antihypertensive agent, so we will try amlodipine 5mg  daily since he has never been on that medication.    He denies any ongoing symptoms associated with high blood pressure including headaches, dizziness/lightheadedness, chest pain, shortness of breath, or vision changes. He has not been checking his BP at home on a regular basis. I have encouraged him to check it regularly and log the values, so that we have more accurate data about his daily blood pressure. He is going to bring his BP cuff to the next visit so we can show him how to check it properly.

## 2023-08-17 NOTE — Assessment & Plan Note (Addendum)
He has a history of tobacco use and COPD.  He is still smoking 3-4 cigarettes daily.  I suspect that his chronic cough symptoms are related to his smoking, and have encouraged him to attempt tobacco cessation.  He is agreeable to try.  I have prescribed him Chantix and nicotine gum, and have given him clear instructions about how to use them.  We will follow-up on this at his next visit.

## 2023-08-17 NOTE — Assessment & Plan Note (Signed)
Patient is here requesting to be retested for sexually transmitted infections.  He states that he has not had any unprotected intercourse since his last testing.  However, he is adamant about being retested for peace of mind.  He states that he did have sex within a few days of his previous testing, so wants to make sure that these tests were accurate.  He does note a tingling sensation in his penis recently, but denies any other symptoms including ulcerations, discharge, dysuria, fever/chills.  We will recheck HIV, gonorrhea/chlamydia/trichomonas urine, RPR, and hepatitis C.  I spoke with him about the importance of using condoms with multiple sexual partners to avoid sexually transmitted infections.  I also emphasized to him the importance of speaking with his partners about getting tested, since they may have been exposed to sexually transmitted infections. He stated that he is going to tell them. We will follow up on this at his next visit.

## 2023-08-17 NOTE — Assessment & Plan Note (Addendum)
Patient was recently evaluated by Dr. Frances Furbish with sleep medicine.  He did a home sleep study which showed moderate obstructive sleep apnea with a total AHI of 16.2/hour and O2 nadir of 82%.  He was recommended to use CPAP, which has been ordered for him. I spoke with him about the importance of using the CPAP nightly and he is agreeable to it. We will follow up on this at his next visit.

## 2023-08-17 NOTE — Progress Notes (Signed)
CC: routine follow up  HPI:  [name] is a 67 y.o. male living with a history stated below and presents today for routine follow up. Please see problem based assessment and plan for additional details.  Past Medical History:  Diagnosis Date   Alcohol abuse 03/12/2016   Appendicitis 04/29/2021   CAD (coronary artery disease) cardiologist-- dr Jens Som   a. 03/2016 NSTEMI/PCI: LM nl, LAD nl, RI nl, LCX nl, OM2 99 ( 2.75 x 16 Synergy DES), RCA nl, EF 45-50%.   ETOH abuse    GERD (gastroesophageal reflux disease)    History of cocaine abuse (HCC)    per pt none since 2017   History of non-ST elevation myocardial infarction (NSTEMI) 03/14/2016   s/p  PCI and DES x1   Hyperlipidemia    Hypertension    Hypertensive heart disease    Ischemic cardiomyopathy    a. 03/2016 Echo: EF 35-40%, diff H, sev inflat HK, Gr2 DD;  b. 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     albuterol (VENTOLIN HFA) 108 (90 Base) MCG/ACT inhaler Inhale 2 puffs into the lungs every 6 (six) hours as needed for wheezing or shortness of breath. 8 g 2   aspirin EC 81 MG tablet Take 81 mg by mouth daily. Swallow whole.     Bempedoic Acid (NEXLETOL) 180 MG TABS Take 1 tablet (180 mg total) by mouth daily. (Patient not taking: Reported on 07/06/2023) 90 tablet 0   benzonatate (TESSALON PERLES) 100 MG capsule Take 1 capsule (100 mg total) by mouth 3 (three) times daily as needed for  cough. 20 capsule 0   carvedilol (COREG) 6.25 MG tablet Take 1 tablet (6.25 mg total) by mouth 2 (two) times daily. (Patient not taking: Reported on 07/06/2023) 180 tablet 3   chlorthalidone (HYGROTON) 25 MG tablet Take 0.5 tablets (12.5 mg total) by mouth daily. (Patient not taking: Reported on 07/06/2023) 45 tablet 3   ezetimibe (ZETIA) 10 MG tablet Take 1 tablet (10 mg total) by mouth daily. (Patient not taking: Reported on 07/06/2023) 90 tablet 3   guaiFENesin (ROBITUSSIN) 100 MG/5ML liquid Take 5 mLs by mouth every 4 (four) hours as needed for cough or to loosen phlegm. 120 mL 0   losartan (COZAAR) 100 MG tablet Take 1 tablet (100 mg total) by mouth daily. (Patient not taking: Reported on 07/06/2023) 90 tablet 3   nicotine (NICODERM CQ - DOSED IN MG/24 HOURS) 21 mg/24hr patch Place 1 patch (21 mg total) onto the skin daily. (Patient not taking: Reported on 07/06/2023) 28 patch 0   nicotine polacrilex (NICORETTE) 4 MG gum Take 1 each (4 mg total) by mouth as needed for smoking cessation. (Patient not taking: Reported on 07/06/2023) 100 tablet 1   nitroGLYCERIN (NITROSTAT) 0.4 MG SL tablet Place 1 tablet (0.4 mg total) under the tongue  every 5 (five) minutes as needed for chest pain. 25 tablet 3   pantoprazole (PROTONIX) 20 MG tablet Take 1 tablet (20 mg total) by mouth daily. 90 tablet 0   rosuvastatin (CRESTOR) 40 MG tablet Take 1 tablet (40 mg total) by mouth daily. (Patient not taking: Reported on 07/06/2023) 90 tablet 3   sildenafil (REVATIO) 20 MG tablet SMARTSIG:1-3 Tablet(s) By Mouth (Patient not taking: Reported on 07/06/2023)     triamcinolone (KENALOG) 0.025 % ointment Apply 1 Application topically 2 (two) times daily. (Patient not taking: Reported on 07/06/2023) 30 g 0   umeclidinium bromide (INCRUSE ELLIPTA) 62.5 MCG/ACT AEPB Inhale 1 puff into the lungs daily. 1 each 1   No current facility-administered medications on file prior to visit.    Family History  Problem Relation Age of  Onset   Heart attack Father    Heart attack Sister    Colon polyps Maternal Uncle    Colon cancer Neg Hx    Esophageal cancer Neg Hx    Stomach cancer Neg Hx    Rectal cancer Neg Hx     Social History   Socioeconomic History   Marital status: Legally Separated    Spouse name: Not on file   Number of children: 8   Years of education: Not on file   Highest education level: Not on file  Occupational History   Not on file  Tobacco Use   Smoking status: Some Days    Current packs/day: 0.50    Average packs/day: 0.5 packs/day for 35.0 years (17.5 ttl pk-yrs)    Types: Cigarettes   Smokeless tobacco: Never   Tobacco comments:    some days less  6  per day   Vaping Use   Vaping status: Never Used  Substance and Sexual Activity   Alcohol use: Yes    Comment: 1/3 pint daily   Drug use: Not Currently    Types: Cocaine    Comment: 11-06-2018 per pt last cocaine "when I had my heart attack"  2017   Sexual activity: Not Currently  Other Topics Concern   Not on file  Social History Narrative   Caffiene 1/2 cup coffee daily   Retired:  [employer]      Social Determinants of Health   Financial Resource Strain: Low Risk  (11/22/2022)   Overall Financial Resource Strain (CARDIA)    Difficulty of Paying Living Expenses: Not very hard  Food Insecurity: Not on File (06/08/2023)   Received from [facility] Insecurity    Food: 0  Transportation Needs: No Transportation Needs (11/22/2022)   PRAPARE - Administrator, Civil Service (Medical): No    Lack of Transportation (Non-Medical): No  Physical Activity: Insufficiently Active (11/22/2022)   Exercise Vital Sign    Days of Exercise per Week: 1 day    Minutes of Exercise per Session: 10 min  Stress: No Stress Concern Present (11/22/2022)   [employer] of Occupational Health - Occupational Stress Questionnaire    Feeling of Stress : Only a little  Social Connections: Not on File (05/27/2023)   Received from [facility]    Social Connections    Connectedness: 0  Intimate Partner Violence: Not At Risk (11/22/2022)   Humiliation, Afraid, Rape, and Kick questionnaire    Fear of Current or Ex-Partner: No    Emotionally Abused: No    Physically Abused: No    Sexually Abused: No   Review of Systems: ROS negative except for what  is noted on the assessment and plan.  Vitals:   08/17/23 1025 08/17/23 1058  BP: (!) 161/94 (!) 137/102  Pulse: 70 72  Temp: 98 F (36.7 C)   TempSrc: Oral   SpO2: 99%   Weight: 172 lb 12.8 oz (78.4 kg)   Height: 5\' 6"  (1.676 m)    Physical Exam: Constitutional: well appearing man Cardiovascular: regular rate and rhythm, no m/r/g; no LE edema, no JVD Pulmonary/Chest: mild end expiratory wheezes bilaterally, normal respiratory effort; wet cough intermittently Neurological: alert & oriented x 3, no focal deficits Psych: normal mood and behavior  Assessment & Plan:   Patient seen with Dr. Sol Blazing  Obstructive sleep apnea Patient was recently evaluated by Dr. Frances Furbish with sleep medicine.  He did a home sleep study which showed moderate obstructive sleep apnea with a total AHI of 16.2/hour and O2 nadir of 82%.  He was recommended to use CPAP, which has been ordered for him. I spoke with him about the importance of using the CPAP nightly and he is agreeable to it. We will follow up on this at his next visit.   Benign essential HTN Patient has a history of hypertension. Blood pressure today is 161/94, 137/102 on repeat. He is currently taking losartan 100 mg daily; his carvedilol 6.25 mg BID and chlorthalidone 25 mg were stopped at his last visit because he felt they were making him feel strangely and was not taking them regularly as a result. Given his blood pressure, he will need more than one antihypertensive agent, so we will try amlodipine 5mg  daily since he has never been on that medication.    He denies any ongoing symptoms associated with high blood pressure including headaches,  dizziness/lightheadedness, chest pain, shortness of breath, or vision changes. He has not been checking his BP at home on a regular basis. I have encouraged him to check it regularly and log the values, so that we have more accurate data about his daily blood pressure. He is going to bring his BP cuff to the next visit so we can show him how to check it properly.  High-risk sexual behavior Patient is here requesting to be retested for sexually transmitted infections.  He states that he has not had any unprotected intercourse since his last testing.  However, he is adamant about being retested for peace of mind.  He states that he did have sex within a few days of his previous testing, so wants to make sure that these tests were accurate.  He does note a tingling sensation in his penis recently, but denies any other symptoms including ulcerations, discharge, dysuria, fever/chills.  We will recheck HIV, gonorrhea/chlamydia/trichomonas urine, RPR, and hepatitis C.  I spoke with him about the importance of using condoms with multiple sexual partners to avoid sexually transmitted infections.  I also emphasized to him the importance of speaking with his partners about getting tested, since they may have been exposed to sexually transmitted infections. He stated that he is going to tell them. We will follow up on this at his next visit.   Tobacco abuse He has a history of tobacco use and COPD.  He is still smoking 3-4 cigarettes daily.  I suspect that his chronic cough symptoms are related to his smoking, and have encouraged him to attempt tobacco cessation.  He is agreeable to try.  I have prescribed him Chantix and nicotine gum, and have given him clear instructions about how to use them.  We will follow-up  on this at his next visit.   Annett Fabian, MD  [facility] Internal Medicine, PGY-1 Phone: [phone] Date 08/17/2023 Time 11:57 AM

## 2023-08-18 LAB — HIV ANTIBODY (ROUTINE TESTING W REFLEX): HIV Screen 4th Generation wRfx: NONREACTIVE

## 2023-08-18 LAB — URINE CYTOLOGY ANCILLARY ONLY
Chlamydia: NEGATIVE
Comment: NEGATIVE
Comment: NEGATIVE
Comment: NORMAL
Neisseria Gonorrhea: NEGATIVE
Trichomonas: NEGATIVE

## 2023-08-18 LAB — HCV AB W REFLEX TO QUANT PCR: HCV Ab: NONREACTIVE

## 2023-08-18 LAB — RPR: RPR Ser Ql: NONREACTIVE

## 2023-08-18 LAB — HCV INTERPRETATION

## 2023-08-21 ENCOUNTER — Telehealth: Payer: Self-pay

## 2023-08-21 NOTE — Telephone Encounter (Signed)
Patient called he received his Chantix from the pharmacy he stated you told him to call to go over both rx's and explaining how he should be taking the medications. Please return patients call.

## 2023-08-23 NOTE — Progress Notes (Signed)
Internal Medicine Clinic Attending  I was physically present during the key portions of the resident provided service and participated in the medical decision making of patient's management care. I reviewed pertinent patient test results.  The assessment, diagnosis, and plan were formulated together and I agree with the documentation in the resident's note.  Lau, Grace, MD  

## 2023-08-23 NOTE — Addendum Note (Signed)
Addended by: Dickie La on: 08/23/2023 01:46 PM   Modules accepted: Level of Service

## 2023-08-28 ENCOUNTER — Other Ambulatory Visit: Payer: Self-pay | Admitting: Student

## 2023-08-29 ENCOUNTER — Telehealth: Payer: Self-pay | Admitting: *Deleted

## 2023-08-29 NOTE — Telephone Encounter (Signed)
Called pt and LVM asking for call back.  

## 2023-08-29 NOTE — Telephone Encounter (Signed)
-----   Message from Huston Foley sent at 08/16/2023  6:16 PM EST ----- Patient referred by PCP for reevaluation of his OSA, seen by me on 07/06/2023, patient had a HST on 08/16/2023.    Please call and notify the patient that the recent home sleep test showed obstructive sleep apnea in the moderate range. I recommend treatment in the form of autoPAP, which means, that we don't have to bring him in for a sleep study with CPAP, but will let him start using a so called autoPAP machine at home, which is a CPAP-like machine with self-adjusting pressures. We will send the order to a local DME company (of his choice, or as per insurance requirement). The DME representative will fit him with a mask, educate him on how to use the machine, how to put the mask on, etc. I have placed an order in the chart. Please send the order, talk to patient, send report to referring MD. We will need a FU in sleep clinic for 10 weeks post-PAP set up, please arrange that with me or one of our NPs. Also reinforce the need for compliance with treatment. Thanks,   Huston Foley, MD, PhD [facility])

## 2023-08-30 NOTE — Telephone Encounter (Signed)
 Pt returning call. Transferred.

## 2023-08-30 NOTE — Telephone Encounter (Signed)
The patient returned my call.  We discussed his sleep study results.  The patient is amenable to proceeding with AutoPap therapy for his moderate sleep apnea.  We discussed the insurance compliance requirements which includes using the machine at least 4 hours each night and also being seen by our office between 30 and 90 days after set up.  Patient was scheduled for November 30, 2023 at 9 AM arrival 830.  He is okay with referral to [facility].  He will watch for a call from them within 1 week.  I gave him the number to follow-up with if needed.  Referral sent to [facility].  Sleep study report sent to referring provider.

## 2023-08-30 NOTE — Telephone Encounter (Signed)
New, Maryella Shivers, Otilio Jefferson, RN; Alain Honey; Jeris Penta, Luis Llorons Torres; 1 other Received, thank you!

## 2023-09-18 ENCOUNTER — Ambulatory Visit: Payer: 59 | Admitting: Student

## 2023-09-18 ENCOUNTER — Encounter: Payer: Self-pay | Admitting: Student

## 2023-09-18 VITALS — BP 127/84 | HR 90

## 2023-09-18 DIAGNOSIS — Z720 Tobacco use: Secondary | ICD-10-CM

## 2023-09-18 DIAGNOSIS — I10 Essential (primary) hypertension: Secondary | ICD-10-CM

## 2023-09-18 DIAGNOSIS — R7303 Prediabetes: Secondary | ICD-10-CM | POA: Insufficient documentation

## 2023-09-18 DIAGNOSIS — Z131 Encounter for screening for diabetes mellitus: Secondary | ICD-10-CM

## 2023-09-18 DIAGNOSIS — F17210 Nicotine dependence, cigarettes, uncomplicated: Secondary | ICD-10-CM

## 2023-09-18 DIAGNOSIS — E785 Hyperlipidemia, unspecified: Secondary | ICD-10-CM | POA: Diagnosis not present

## 2023-09-18 DIAGNOSIS — R300 Dysuria: Secondary | ICD-10-CM

## 2023-09-18 DIAGNOSIS — G4733 Obstructive sleep apnea (adult) (pediatric): Secondary | ICD-10-CM

## 2023-09-18 LAB — POCT URINALYSIS DIPSTICK
Bilirubin, UA: NEGATIVE
Glucose, UA: NEGATIVE
Ketones, UA: NEGATIVE
Nitrite, UA: NEGATIVE
Protein, UA: POSITIVE — AB
Spec Grav, UA: 1.025 (ref 1.010–1.025)
Urobilinogen, UA: 0.2 U/dL
pH, UA: 5.5 (ref 5.0–8.0)

## 2023-09-18 NOTE — Assessment & Plan Note (Signed)
 New problem of burning with urination. Patient correlates onset of symptoms with starting Chantix  and Mucinex , both of which he stopped taking four days ago. He reports that his symptoms have improved to stinging rather than burning since stopping these medications. He is not experiencing any discharge, blood in his urine, groin pain, rashes, or skin changes. He has not had any sexual encounters since last visit.   Office urine dipstick was notable for small leukocytes and positive for protein. Notably negative for nitrates and bacteria. Formal UA ordered, but no antibiotics indicated at this time.  - Follow up UA

## 2023-09-18 NOTE — Progress Notes (Signed)
 The care of the patient was discussed with Dr. Trudy and the assessment and plan was formulated with their assistance.  Please see their note for official documentation of the patient encounter.   Subjective:   Patient ID: Lucas Stark male   DOB: Dec 07, 1955 68 y.o.   MRN: [id number]  HPI: [name] is a 68 y.o. male presenting for HTN follow up and acute dysuria.   Past Medical History:  Diagnosis Date   Alcohol abuse 03/12/2016   Appendicitis 04/29/2021   CAD (coronary artery disease) cardiologist-- dr pietro   a. 03/2016 NSTEMI/PCI: LM nl, LAD nl, RI nl, LCX nl, OM2 99 ( 2.75 x 16 Synergy DES), RCA nl, EF 45-50%.   ETOH abuse    GERD (gastroesophageal reflux disease)    History of cocaine abuse (HCC)    per pt none since 2017   History of non-ST elevation myocardial infarction (NSTEMI) 03/14/2016   s/p  PCI and DES x1   Hyperlipidemia    Hypertension    Hypertensive heart disease    Ischemic cardiomyopathy    a. 03/2016 Echo: EF 35-40%, diff H, sev inflat HK, Gr2 DD;  b. 03/2016 EF 45-50% by LV gram.;  echo 10-24-2018, ef 45-50%   Knee effusion, left 08/15/2018   Myocardial infarction [facility]) 2017   NSTEMI    OA (osteoarthritis)    left knee   Obstructive sleep apnea 06/01/2023   S/P drug eluting coronary stent placement 03/14/2016   DES x1 to OM1   Tobacco abuse    Wears dentures    upper   Wears partial dentures    lower   Current Outpatient Medications  Medication Sig Dispense Refill   acetaminophen  (TYLENOL ) 500 MG tablet Take 1,000 mg by mouth every 6 (six) hours as needed for moderate pain or headache.     albuterol  (VENTOLIN  HFA) 108 (90 Base) MCG/ACT inhaler Inhale 2 puffs into the lungs every 6 (six) hours as needed for wheezing or shortness of breath. 8 g 2   amLODipine  (NORVASC ) 5 MG tablet Take 1 tablet (5 mg total) by mouth daily. 30 tablet 11   aspirin  EC 81 MG tablet Take 81 mg by mouth daily. Swallow whole.     Bempedoic Acid  (NEXLETOL ) 180  MG TABS Take 1 tablet (180 mg total) by mouth daily. (Patient not taking: Reported on 07/06/2023) 90 tablet 0   benzonatate  (TESSALON  PERLES) 100 MG capsule Take 1 capsule (100 mg total) by mouth 3 (three) times daily as needed for cough. 20 capsule 0   carvedilol  (COREG ) 6.25 MG tablet Take 1 tablet (6.25 mg total) by mouth 2 (two) times daily. (Patient not taking: Reported on 07/06/2023) 180 tablet 3   chlorthalidone  (HYGROTON ) 25 MG tablet Take 0.5 tablets (12.5 mg total) by mouth daily. (Patient not taking: Reported on 07/06/2023) 45 tablet 3   ezetimibe  (ZETIA ) 10 MG tablet Take 1 tablet (10 mg total) by mouth daily. (Patient not taking: Reported on 07/06/2023) 90 tablet 3   guaiFENesin  (ROBITUSSIN) 100 MG/5ML liquid Take 5 mLs by mouth every 4 (four) hours as needed for cough or to loosen phlegm. 120 mL 0   losartan  (COZAAR ) 100 MG tablet Take 1 tablet (100 mg total) by mouth daily. (Patient not taking: Reported on 07/06/2023) 90 tablet 3   nicotine  (NICODERM CQ  - DOSED IN MG/24 HOURS) 21 mg/24hr patch Place 1 patch (21 mg total) onto the skin daily. (Patient not taking: Reported on 07/06/2023) 28 patch  0   nicotine  polacrilex (NICORETTE ) 4 MG gum Take 1 each (4 mg total) by mouth as needed for smoking cessation. (Patient not taking: Reported on 07/06/2023) 100 tablet 1   nitroGLYCERIN  (NITROSTAT ) 0.4 MG SL tablet Place 1 tablet (0.4 mg total) under the tongue every 5 (five) minutes as needed for chest pain. 25 tablet 3   pantoprazole  (PROTONIX ) 20 MG tablet TAKE 1 TABLET(20 MG) BY MOUTH DAILY 90 tablet 0   rosuvastatin  (CRESTOR ) 40 MG tablet Take 1 tablet (40 mg total) by mouth daily. (Patient not taking: Reported on 07/06/2023) 90 tablet 3   sildenafil (REVATIO) 20 MG tablet SMARTSIG:1-3 Tablet(s) By Mouth (Patient not taking: Reported on 07/06/2023)     triamcinolone  (KENALOG ) 0.025 % ointment Apply 1 Application topically 2 (two) times daily. (Patient not taking: Reported on 07/06/2023) 30 g 0    umeclidinium bromide  (INCRUSE ELLIPTA ) 62.5 MCG/ACT AEPB Inhale 1 puff into the lungs daily. 1 each 1   varenicline  (CHANTIX ) 1 MG tablet TAKE 1 TABLET(1 MG) BY MOUTH TWICE DAILY 180 tablet 0   No current facility-administered medications for this visit.   Family History  Problem Relation Age of Onset   Heart attack Father    Heart attack Sister    Colon polyps Maternal Uncle    Colon cancer Neg Hx    Esophageal cancer Neg Hx    Stomach cancer Neg Hx    Rectal cancer Neg Hx    Social History   Socioeconomic History   Marital status: Legally Separated    Spouse name: Not on file   Number of children: 8   Years of education: Not on file   Highest education level: Not on file  Occupational History   Not on file  Tobacco Use   Smoking status: Some Days    Current packs/day: 0.50    Average packs/day: 0.5 packs/day for 35.0 years (17.5 ttl pk-yrs)    Types: Cigarettes   Smokeless tobacco: Never   Tobacco comments:    some days less  6  per day   Vaping Use   Vaping status: Never Used  Substance and Sexual Activity   Alcohol use: Yes    Comment: 1/3 pint daily   Drug use: Not Currently    Types: Cocaine    Comment: 11-06-2018 per pt last cocaine when I had my heart attack  2017   Sexual activity: Not Currently  Other Topics Concern   Not on file  Social History Narrative   Caffiene 1/2 cup coffee daily   Retired:  [employer]      Social Drivers of Health   Financial Resource Strain: Low Risk  (11/22/2022)   Overall Financial Resource Strain (CARDIA)    Difficulty of Paying Living Expenses: Not very hard  Food Insecurity: Not on File (06/08/2023)   Received from [facility]    Food: 0  Transportation Needs: No Transportation Needs (11/22/2022)   PRAPARE - Administrator, Civil Service (Medical): No    Lack of Transportation (Non-Medical): No  Physical Activity: Insufficiently Active (11/22/2022)   Exercise Vital Sign    Days of  Exercise per Week: 1 day    Minutes of Exercise per Session: 10 min  Stress: No Stress Concern Present (11/22/2022)   [employer] of Occupational Health - Occupational Stress Questionnaire    Feeling of Stress : Only a little  Social Connections: Not on File (05/27/2023)   Received from [facility]  Social Connections    Connectedness: 0   Review of Systems: Pertinent items noted in HPI and remainder of comprehensive ROS otherwise negative.  Objective:  Physical Exam: Vitals:   09/18/23 0954  BP: 127/84  Pulse: 90   BP 127/84 (BP Location: Left Arm, Patient Position: Sitting, Cuff Size: Normal)   Pulse 90   General Appearance:    Alert, cooperative, no distress, appears stated age  Head:    Normocephalic, without obvious abnormality, atraumatic  Throat:   Lips, mucosa, and tongue normal; teeth and gums normal  Lungs:     Clear to auscultation bilaterally, no wheezes, normal work of breathing on room air  Heart:    Regular rate and rhythm, S1 and S2 normal, no murmur, rubs   or gallop  Skin:   Skin color, texture, turgor normal, no rashes or lesions  Neurologic:   Alert and oriented. Grossly normal strength, sensation and gait. No focal deficits    Assessment & Plan:  Benign essential HTN BP today was 127/84, which is within goal. He is currently prescribed losartan  100 mg and amlodipine  5 mg and feels as though this regimen has improved his exercise tolerance. He is not experiencing headaches, dizziness, chest pain, or vision changes. He experiences some SOB when bending over to tie his shoes but not at other times. He does not check his BP at home. Recent BMP on 10/22 was wnl.  - Continue losartan  100 mg - Continue amlodipine  5 mg  Obstructive sleep apnea Home sleep study showed moderate OSA for which he was recommended CPAP. He used the machine for a night and took it in for adjustment. He will pick it up next week and was counseled on the importance of using it every night.    Tobacco abuse History of tobacco use and COPD. He smokes about 3 cigarettes daily, similar to last visit. At that visit he was prescribed Chantix  and nicotine  gum to help with the cravings. He reports that he is unable to use the gum because his partial dentures make it difficult for him to chew the gum. He has tried patches but feels dizzy when using them. He began taking the Chantix  and experienced burning with urination, so he stopped taking it because he believed the medication was causing the dysuria. He also started taking Mucinex  for his chronic cough at that time and now believes the Mucinex  caused his dysuria. Was counseled to restart the Chantix  once his dysuria resolves, as he reports it improved his cravings. He is committed to quitting and believes he can do so on his own.  - Encouraged use of Chantix  for craving control - Follow up next visit  Dysuria New problem of burning with urination. Patient correlates onset of symptoms with starting Chantix  and Mucinex , both of which he stopped taking four days ago. He reports that his symptoms have improved to stinging rather than burning since stopping these medications. He is not experiencing any discharge, blood in his urine, groin pain, rashes, or skin changes. He has not had any sexual encounters since last visit.   Office urine dipstick was notable for small leukocytes and positive for protein. Notably negative for nitrates and bacteria. Formal UA ordered, but no antibiotics indicated at this time.  - Follow up UA  Screening for diabetes mellitus Proteinuria noted on urine dipstick today. Last A1c was 5.7 in 2017. Rechecking A1c and BMP today. Previous BMP on 10/22 showed Cr of 1.08, which is about his baseline.  Dyslipidemia, goal LDL below 70 Currently prescribed rosuvastatin  40 mg daily, Zetia  10 mg daily, and bempedoic acid  180 mg daily. Last lipid panel on 05/17/2022 showed cholesterol of 223, triglycerides at 299, and LDL at 111.  Rechecking lipids today.   Therisa Cramp, MS3

## 2023-09-18 NOTE — Assessment & Plan Note (Signed)
 Proteinuria noted on urine dipstick today. Last A1c was 5.7 in 2017. Rechecking A1c and BMP today. Previous BMP on 10/22 showed Cr of 1.08, which is about his baseline.

## 2023-09-18 NOTE — Assessment & Plan Note (Signed)
 History of tobacco use and COPD. He smokes about 3 cigarettes daily, similar to last visit. At that visit he was prescribed Chantix  and nicotine  gum to help with the cravings. He reports that he is unable to use the gum because his partial dentures make it difficult for him to chew the gum. He has tried patches but feels dizzy when using them. He began taking the Chantix  and experienced burning with urination, so he stopped taking it because he believed the medication was causing the dysuria. He also started taking Mucinex  for his chronic cough at that time and now believes the Mucinex  caused his dysuria. Was counseled to restart the Chantix  once his dysuria resolves, as he reports it improved his cravings. He is committed to quitting and believes he can do so on his own.  - Encouraged use of Chantix  for craving control - Follow up next visit

## 2023-09-18 NOTE — Assessment & Plan Note (Signed)
 BP today was 127/84, which is within goal. He is currently prescribed losartan  100 mg and amlodipine  5 mg and feels as though this regimen has improved his exercise tolerance. He is not experiencing headaches, dizziness, chest pain, or vision changes. He experiences some SOB when bending over to tie his shoes but not at other times. He does not check his BP at home. Recent BMP on 10/22 was wnl.  - Continue losartan  100 mg - Continue amlodipine  5 mg

## 2023-09-18 NOTE — Assessment & Plan Note (Signed)
 Currently prescribed rosuvastatin 40 mg daily, Zetia 10 mg daily, and bempedoic acid 180 mg daily. Last lipid panel on 05/17/2022 showed cholesterol of 223, triglycerides at 299, and LDL at 111. Rechecking lipids today.

## 2023-09-18 NOTE — Patient Instructions (Addendum)
 Thank you, Mr.Alano L Prohaska for allowing us  to provide your care today. Today we discussed:  Blood pressure: Your blood pressure today is great at 127/84. Keep taking the losartan  100 mg and amlodipine  5 mg.   Sleep apnea: Please make sure to use your CPAP every night when you get it and follow the cleaning instructions.   Smoking cessation: Keep working on cutting down on those last few cigarettes! When you're feeling well, you can try taking the Chantix  again. Please let us  know if the burning happens again or if you would like something else to help your quit smoking.   Pain with urination: The urine sample today doesn't point towards an infection, so we have sent it for a more in-depth analysis. We will let you know if the results indicate a need for antibiotics.   High cholesterol: Our records show we have not checked your lipids in over a year, so we are checking your blood for those and will let you know if there is anything to change.   I have ordered the following labs for you:  Lab Orders         BMP8+Anion Gap         Hemoglobin A1c         Lipid Profile         Urinalysis, Reflex Microscopic         POCT Urinalysis Dipstick (18997)       Referrals ordered today:   Referral Orders  No referral(s) requested today     I have ordered the following medication/changed the following medications:   Stop the following medications: There are no discontinued medications.   Start the following medications: No orders of the defined types were placed in this encounter.    Follow up: 1-2 months   We look forward to seeing you next time. Please call our clinic at [phone] if you have any questions or concerns. The best time to call is Monday-Friday from 9am-4pm, but there is someone available 24/7. If after hours or the weekend, call the main hospital number and ask for the Internal Medicine Resident On-Call. If you need medication refills, please notify your pharmacy one week  in advance and they will send us  a request.   Thank you for trusting me with your care. Wishing you the best!   Therisa Cramp, MS3

## 2023-09-18 NOTE — Assessment & Plan Note (Signed)
 Home sleep study showed moderate OSA for which he was recommended CPAP. He used the machine for a night and took it in for adjustment. He will pick it up next week and was counseled on the importance of using it every night.

## 2023-09-19 LAB — BMP8+ANION GAP
Anion Gap: 13 mmol/L (ref 10.0–18.0)
BUN/Creatinine Ratio: 13 (ref 10–24)
BUN: 12 mg/dL (ref 8–27)
CO2: 27 mmol/L (ref 20–29)
Calcium: 9.2 mg/dL (ref 8.6–10.2)
Chloride: 106 mmol/L (ref 96–106)
Creatinine, Ser: 0.94 mg/dL (ref 0.76–1.27)
Glucose: 96 mg/dL (ref 70–99)
Potassium: 4.8 mmol/L (ref 3.5–5.2)
Sodium: 146 mmol/L — ABNORMAL HIGH (ref 134–144)
eGFR: 90 mL/min/{1.73_m2} (ref >59)

## 2023-09-19 LAB — LIPID PANEL
Chol/HDL Ratio: 4.3 {ratio} (ref 0.0–5.0)
Cholesterol, Total: 179 mg/dL (ref 100–199)
HDL: 42 mg/dL (ref >39)
LDL Chol Calc (NIH): 117 mg/dL — ABNORMAL HIGH (ref 0–99)
Triglycerides: 110 mg/dL (ref 0–149)
VLDL Cholesterol Cal: 20 mg/dL (ref 5–40)

## 2023-09-19 LAB — HEMOGLOBIN A1C
Est. average glucose Bld gHb Est-mCnc: 137 mg/dL
Hgb A1c MFr Bld: 6.4 % — ABNORMAL HIGH (ref 4.8–5.6)

## 2023-09-20 MED ORDER — SULFAMETHOXAZOLE-TRIMETHOPRIM 800-160 MG PO TABS: 1.0000 | ORAL_TABLET | Freq: Two times a day (BID) | ORAL | 0 refills | Status: AC

## 2023-09-20 NOTE — Addendum Note (Signed)
 Addended by: Rana Snare on: 09/20/2023 05:03 PM   Modules accepted: Orders

## 2023-09-21 LAB — UA/M W/RFLX CULTURE, COMP
Bilirubin, UA: NEGATIVE
Glucose, UA: NEGATIVE
Ketones, UA: NEGATIVE
Nitrite, UA: POSITIVE — AB
Specific Gravity, UA: 1.02 (ref 1.005–1.030)
Urobilinogen, Ur: 1 mg/dL (ref 0.2–1.0)
pH, UA: 5.5 (ref 5.0–7.5)

## 2023-09-21 LAB — MICROSCOPIC EXAMINATION
Casts: NONE SEEN /LPF
WBC, UA: >30 /[HPF] — AB (ref 0–5)

## 2023-09-21 LAB — URINE CULTURE, COMPREHENSIVE

## 2023-09-21 NOTE — Progress Notes (Signed)
 Internal Medicine Clinic Attending  Case discussed with the student and resident at the time of the visit.  We reviewed the student's history and exam and pertinent patient test results.  I agree with the assessment, diagnosis, and plan of care documented in the student's note as attested by the supervising resident.

## 2023-10-03 ENCOUNTER — Telehealth: Payer: Self-pay | Admitting: *Deleted

## 2023-10-03 NOTE — Telephone Encounter (Signed)
Call from Ladene Artist ., NP with [employer] [facility].  Visited patient today and did HbA1C 7.5 and Quantaflow PVD 0.44.in left foot and 0.78 in right foot.  No complaints of pain or swelling.

## 2023-10-04 NOTE — Progress Notes (Signed)
Cardiology Office Note:  .   Date:  10/12/2023  ID:  Lucas Stark, DOB 1956/01/17, MRN [id number] PCP: Lucas Fabian, MD  [facility] Providers Cardiologist:  Lucas Millers, MD   History of Present Illness: .   Lucas Stark is a 68 y.o. male with past medical history of CAD, chronic combined systolic and diastolic heart failure, ischemic cardiomyopathy, hypertension, hyperlipidemia, GERD, former polysubstance abuse (alcohol and cocaine), osteoarthritis, tobacco use, OSA.  Patient is followed by Dr. Jens Stark, has not been seen by our practice since 12/2021.  Per chart review, patient had previously been admitted in 03/2016 for treatment of NSTEMI.  Cardiac catheterization 11/17/15 showed single-vessel CAD with severe stenosis in first OM branch.  This was treated with successful PTCA/DES x 1.  There was no obstructive disease in the RCA or LAD.  Recommended treating with aspirin and Brilinta for 1 year.  Echocardiogram at that time showed EF 35-40% with global hypokinesis, grade 2 diastolic dysfunction, trace MR. Repeat echocardiogram in 07/2016 showed EF 50-55%.  Compared to 03/2016 study, the LV systolic and diastolic function had improved and there was only subtle residual disproportionate hypokinesis in the inferolateral wall.  Later, echocardiogram in 10/2017 showed EF 35-40% with diffuse hypokinesis worse in the inferolateral wall.  Nuclear stress test in 10/2017 showed findings consistent with prior myocardial infarction, no ischemia.  His GDMT was titrated.  Most recent echocardiogram from 11/2020 showed EF 40-45% with global hypokinesis, grade 1 diastolic dysfunction, normal RV function, mild MR.  Patient was last by cardiology in 12/2021.  At that time, patient was doing well and denied chest discomfort or shortness of breath.  Blood pressure was well-controlled.  Today, patient presents for a refill of his nitroglycerin prescription. He reports not having taken any nitroglycerin since  his heart attack in 2017, as he has not experienced any chest pain since then. Noticed that the pills had expired, and he was told he needed an appointment to get refills. He denies chest pain, shortness of breath, palpitations, syncope, near syncope. He uses a CPAP machine for four hours each night and reports no issues with his breathing during the day. He is able to perform his daily activities without any breathing difficulties or chest discomfort .  Patient expresses concerns about a pill pack he has been receiving, which contains multiple medications. He reports feeling unwell when taking these medications and has stopped taking them. He is currently only taking amlodipine and losartan from separate bottles. He is unsure which medication in the pill pack was causing his discomfort, but he reports feeling better since he stopped taking them. He requests to have his medications in separate bottles and to be informed about which ones are necessary for his heart condition. He has several medications on his med list that he is no longer taking. Also has pills in his pill pack that are not on his medication list.    ROS: Denies chest pain, shortness of breath, syncope, near syncope, ankle swelling, palpitations   Studies Reviewed: .   Cardiac Studies & Procedures   CARDIAC CATHETERIZATION  CARDIAC CATHETERIZATION 03/14/2016  Narrative  2nd Mrg lesion, 99% stenosed. Post intervention, there is a 0% residual stenosis.  There is mild left ventricular systolic dysfunction.  1. Single vessel CAD with severe stenosis first obtuse marginal branch 2. Successful PTCA/DES x 1 first obtuse marginal branch 3. No obstructive disease in the RCA or LAD 4. Mild segmental LV systolic dysfunction  Recommendations: Will continue  ASA and Brilinta for one year post PCI. Continue statin. No beta blocker with cocaine use. Likely discharge home tomorrow am.  Findings Coronary Findings Diagnostic  Dominance:  Right  Left Anterior Descending . Vessel is large.  First Diagonal Branch The vessel is moderate in size.  Second Diagonal Branch The vessel is moderate in size.  Third Diagonal Branch The vessel is moderate in size.  Ramus Intermedius . Vessel is small.  Left Circumflex . Vessel is large.  Second Obtuse Marginal Branch The vessel is large in size. Discrete.  Right Coronary Artery . Vessel is large. Vessel is angiographically normal.  Right Posterior Descending Artery The vessel is moderate in size.  Intervention  2nd Mrg lesion PCI The pre-interventional distal flow is normal (TIMI 3). Pre-stent angioplasty was performed. A drug-eluting stent was placed. The strut is apposed. Post-stent angioplasty was performed. The post-interventional distal flow is normal (TIMI 3). The intervention was successful. No complications occurred at this lesion. There is a 0% residual stenosis post intervention.   STRESS TESTS  MYOCARDIAL PERFUSION IMAGING 11/07/2017  Narrative  The left ventricular ejection fraction is moderately decreased (30-44%).  Nuclear stress EF: 34%.  Blood pressure demonstrated a normal response to exercise.  There was no ST segment deviation noted during stress.  Findings consistent with prior myocardial infarction.  This is an intermediate risk study.  Inferior wall infarct at mid and apical level no ischemia Diffuse hypokinesis EF 34%  ECHOCARDIOGRAM  ECHOCARDIOGRAM COMPLETE 11/11/2020  Narrative ECHOCARDIOGRAM REPORT    Patient Name:   Lucas Stark Date of Exam: 11/11/2020 Medical Rec #:  [id number]      Height:       66.0 in Accession #:    [id number]     Weight:       185.6 lb Date of Birth:  [birth year]      BSA:          1.937 m Patient Age:    65 years       BP:           146/98 mmHg Patient Gender: M              HR:           60 bpm. Exam Location:  [place]  Procedure: 2D Echo, 3D Echo, Cardiac Doppler and Color  Doppler  Indications:    I25.5 Ischemic cardiomyopathy; PreOp  History:        Patient has prior history of Echocardiogram examinations, most recent 10/24/2018. CAD and Previous Myocardial Infarction, Preop; Risk Factors:Current Smoker and ETOH/DRUG ABUSE.  Sonographer:    Joaquim Nam Referring Phys: [id number] JAMES HOCHREIN  IMPRESSIONS   1. Left ventricular ejection fraction, by estimation, is 40 to 45%. The left ventricle has mildly decreased function. The left ventricle demonstrates global hypokinesis. Left ventricular diastolic parameters are consistent with Grade I diastolic dysfunction (impaired relaxation). 2. Right ventricular systolic function is normal. The right ventricular size is normal. 3. The mitral valve is normal in structure. Mild mitral valve regurgitation. No evidence of mitral stenosis. 4. The aortic valve is tricuspid. Aortic valve regurgitation is not visualized. No aortic stenosis is present. 5. The inferior vena cava is normal in size with greater than 50% respiratory variability, suggesting right atrial pressure of 3 mmHg.  FINDINGS Left Ventricle: Left ventricular ejection fraction, by estimation, is 40 to 45%. The left ventricle has mildly decreased function. The left ventricle demonstrates global hypokinesis. The left ventricular internal cavity size  was normal in size. There is no left ventricular hypertrophy. Left ventricular diastolic parameters are consistent with Grade I diastolic dysfunction (impaired relaxation). Normal left ventricular filling pressure.  Right Ventricle: The right ventricular size is normal. No increase in right ventricular wall thickness. Right ventricular systolic function is normal.  Left Atrium: Left atrial size was normal in size.  Right Atrium: Right atrial size was normal in size.  Pericardium: There is no evidence of pericardial effusion.  Mitral Valve: The mitral valve is normal in structure. Mild mitral valve regurgitation. No  evidence of mitral valve stenosis.  Tricuspid Valve: The tricuspid valve is normal in structure. Tricuspid valve regurgitation is not demonstrated. No evidence of tricuspid stenosis.  Aortic Valve: The aortic valve is tricuspid. Aortic valve regurgitation is not visualized. No aortic stenosis is present.  Pulmonic Valve: The pulmonic valve was normal in structure. Pulmonic valve regurgitation is not visualized. No evidence of pulmonic stenosis.  Aorta: The aortic root and ascending aorta are structurally normal, with no evidence of dilitation and the ascending aorta was not well visualized.  Venous: The pulmonary veins were not well visualized. The inferior vena cava is normal in size with greater than 50% respiratory variability, suggesting right atrial pressure of 3 mmHg.  IAS/Shunts: No atrial level shunt detected by color flow Doppler.   LEFT VENTRICLE PLAX 2D LVIDd:         5.61 cm  Diastology LVIDs:         4.50 cm  LV e' medial:    6.53 cm/s LV PW:         0.82 cm  LV E/e' medial:  10.0 LV IVS:        1.05 cm  LV e' lateral:   9.46 cm/s LVOT diam:     2.00 cm  LV E/e' lateral: 6.9 LV SV:         44 LV SV Index:   23 LVOT Area:     3.14 cm  3D Volume EF: 3D EF:        50 % LV EDV:       228 ml LV ESV:       114 ml LV SV:        114 ml  RIGHT VENTRICLE RV S prime:     10.70 cm/s TAPSE (M-mode): 1.8 cm  LEFT ATRIUM             Index       RIGHT ATRIUM           Index LA diam:        3.40 cm 1.76 cm/m  RA Area:     11.90 cm LA Vol (A2C):   35.5 ml 18.32 ml/m RA Volume:   26.10 ml  13.47 ml/m LA Vol (A4C):   28.2 ml 14.56 ml/m LA Biplane Vol: 32.9 ml 16.98 ml/m AORTIC VALVE LVOT Vmax:   59.40 cm/s LVOT Vmean:  45.300 cm/s LVOT VTI:    0.141 m  AORTA Ao Root diam: 2.70 cm Ao Asc diam:  2.90 cm  MITRAL VALVE MV Area (PHT): 3.34 cm    SHUNTS MV Decel Time: 227 msec    Systemic VTI:  0.14 m MV E velocity: 65.60 cm/s  Systemic Diam: 2.00 cm MV A velocity:  71.60 cm/s MV E/A ratio:  0.92  Norman Herrlich MD Electronically signed by Norman Herrlich MD Signature Date/Time: 11/11/2020/12:35:11 PM    Final  Risk Assessment/Calculations:             Physical Exam:   VS:  BP 114/80 (BP Location: Left Arm, Patient Position: Sitting, Cuff Size: Normal)   Pulse 64   Ht 5\' 7"  (1.702 m)   Wt 174 lb (78.9 kg)   SpO2 98%   BMI 27.25 kg/m    Wt Readings from Last 3 Encounters:  10/12/23 174 lb (78.9 kg)  08/17/23 172 lb 12.8 oz (78.4 kg)  08/01/23 174 lb 8 oz (79.2 kg)    GEN: Well nourished, well developed in no acute distress. Sitting upright on the exam table  NECK: No JVD; No carotid bruits CARDIAC:  RRR, no murmurs, rubs, gallops. Radial pulses 2+ bilaterally  RESPIRATORY:  Clear to auscultation without rales, wheezing or rhonchi. Normal WOB on room air  ABDOMEN: Soft, non-tender, non-distended  EXTREMITIES:  No edema in BLE; No deformity   ASSESSMENT AND PLAN: .    Majority of visit was spent reviewing his current med list, as well has the medications he has been taking from his pill pack that were not on his medication list. Patient reports that one of the medications in his pill pack made him feel poorly, but he is not sure what medication it was. Stopped taking all medications in the pill pack about 1 month ago. In the pill pack, there was chlorthalidone, carvedilol, pantoprazole, zetia, and losartan. Patient also reported taking amlodipine and aspirin. He had several old medications listed on his med list. After reviewing med list and making med changes this visit, his current medications include ASA 81 mg daily, amlodipine 5 mg daily, carvedilol 3.125 mg BID, zetia 10 mg daily, losartan 100 mg daily, PRN nitroglycerin, and crestor 40 mg daily. Provided patient with a copy of his updated medication list to take to his PCP   CAD  -Patient previously had NSTEMI in 03/2016 and received DES to the first OM.  There was no obstructive  disease in the RCA or LAD - Patient denies chest pain, DOE. EKG today is without ischemic changes  - Continue aspirin 81 mg daily - Continue amlodipine 5 mg daily. Restart carvedilol 3.125 mg BID mg daily (patient had not been taking carvedilol as it was in the pill pack)  - Continue Zetia 10 mg daily. Restart crestor 40 mg daily - Provided refill of nitroglycerin   Chronic Combined Systolic and Diastolic Heart Failure  Ischemic Cardiomyopathy Mild MR - EF previously as low as 35-40% at time of NSTEMI in 03/2016.  Most recent echocardiogram from 11/2021 showed EF 40 to 45%, grade 1 diastolic dysfunction, normal RV function, mild MR - Patient euvolemic on exam. Denies shortness of breath, ankle swelling  - Continue losartan 100 mg daily. Restart carvedilol 3.125 mg BID  - Ordered BMP on losartan - Consider adding SGLT2i or spiro at follow up appointment. Did not add today due to confusion over med list   HLD  -Lipid panel from 09/18/2023 showed LDL 117, HDL 42, triglycerides 110, total cholesterol 179 - Continue zetia 10 mg daily. Restart crestor 40 mg daily  - Will need LFTs, lipid panel in 8 weeks   HTN  - BP well controlled. Denies symptoms of orthostatic hypotension  - Continue amlodipine 5 mg daily, losartan 100 mg daily. Resume carvedilol 3.125 mg BID   OSA  - Reports compliance with CPAP    Tobacco use  - Counseled on tobacco cessation     Dispo: Follow up in 6  weeks with APP   Signed, Jonita Albee, PA-C

## 2023-10-05 DIAGNOSIS — G4733 Obstructive sleep apnea (adult) (pediatric): Secondary | ICD-10-CM | POA: Diagnosis not present

## 2023-10-12 ENCOUNTER — Ambulatory Visit: Payer: 59 | Attending: Cardiology | Admitting: Cardiology

## 2023-10-12 ENCOUNTER — Encounter: Payer: Self-pay | Admitting: Cardiology

## 2023-10-12 VITALS — BP 114/80 | HR 64 | Ht 67.0 in | Wt 174.0 lb

## 2023-10-12 DIAGNOSIS — Z720 Tobacco use: Secondary | ICD-10-CM | POA: Diagnosis not present

## 2023-10-12 DIAGNOSIS — I255 Ischemic cardiomyopathy: Secondary | ICD-10-CM

## 2023-10-12 DIAGNOSIS — I2510 Atherosclerotic heart disease of native coronary artery without angina pectoris: Secondary | ICD-10-CM | POA: Diagnosis not present

## 2023-10-12 DIAGNOSIS — Z9861 Coronary angioplasty status: Secondary | ICD-10-CM | POA: Diagnosis not present

## 2023-10-12 DIAGNOSIS — I5042 Chronic combined systolic (congestive) and diastolic (congestive) heart failure: Secondary | ICD-10-CM | POA: Diagnosis not present

## 2023-10-12 DIAGNOSIS — E785 Hyperlipidemia, unspecified: Secondary | ICD-10-CM | POA: Diagnosis not present

## 2023-10-12 DIAGNOSIS — E7800 Pure hypercholesterolemia, unspecified: Secondary | ICD-10-CM | POA: Diagnosis not present

## 2023-10-12 DIAGNOSIS — I340 Nonrheumatic mitral (valve) insufficiency: Secondary | ICD-10-CM

## 2023-10-12 MED ORDER — EZETIMIBE 10 MG PO TABS: 10.0000 mg | ORAL_TABLET | Freq: Every day | ORAL | 3 refills | Status: AC

## 2023-10-12 MED ORDER — ROSUVASTATIN CALCIUM 40 MG PO TABS: 40.0000 mg | ORAL_TABLET | Freq: Every day | ORAL | 3 refills | Status: AC

## 2023-10-12 MED ORDER — CARVEDILOL 3.125 MG PO TABS: 3.1250 mg | ORAL_TABLET | Freq: Two times a day (BID) | ORAL | 3 refills | Status: AC

## 2023-10-12 MED ORDER — NITROGLYCERIN 0.4 MG SL SUBL: 0.4000 mg | SUBLINGUAL_TABLET | SUBLINGUAL | 3 refills | Status: AC | PRN

## 2023-10-12 NOTE — Patient Instructions (Signed)
Medication Instructions:  Start Zetia 10 mg once a day Carvedilol 3.25 mg twice a day Start Crestor 40 mg once a day *If you need a refill on your cardiac medications before your next appointment, please call your pharmacy*  Lab Work: In 2 weeks we will draw a BMP If you have labs (blood work) drawn today and your tests are completely normal, you will receive your results only by: MyChart Message (if you have MyChart) OR A paper copy in the mail If you have any lab test that is abnormal or we need to change your treatment, we will call you to review the results.  Testing/Procedures: No testing  Follow-Up: At [facility], you and your health needs are our priority.  As part of our continuing mission to provide you with exceptional heart care, we have created designated Provider Care Teams.  These Care Teams include your primary Cardiologist (physician) and Advanced Practice Providers (APPs -  Physician Assistants and Nurse Practitioners) who all work together to provide you with the care you need, when you need it.  We recommend signing up for the patient portal called "MyChart".  Sign up information is provided on this After Visit Summary.  MyChart is used to connect with patients for Virtual Visits (Telemedicine).  Patients are able to view lab/test results, encounter notes, upcoming appointments, etc.  Non-urgent messages can be sent to your provider as well.   To learn more about what you can do with MyChart, go to [email or web address].    Your next appointment:   6 week(s)  Provider:   Any APP

## 2023-10-15 NOTE — Progress Notes (Unsigned)
CC: Prediabetes follow-up  HPI:  Lucas Stark is a 68 y.o. male with a past medical history of heart failure with mildly reduced ejection fraction, hypertension, COPD, who presents to clinic to discuss diabetes.  Please see assessment and plan for full HPI.  Medications: Hypertension: Amlodipine 5 mg daily, losartan 100 mg daily CAD: Aspirin 81 mg daily HFmrEF: Coreg 3.125 mg twice daily, losartan 100 mg daily, Jardiance 10 mg daily Hyperlipidemia: Crestor 40 mg daily, Zetia 10 mg daily   Past Medical History:  Diagnosis Date   Alcohol abuse 03/12/2016   Appendicitis 04/29/2021   CAD (coronary artery disease) cardiologist-- dr Lucas Stark   a. 03/2016 NSTEMI/PCI: LM nl, LAD nl, RI nl, LCX nl, OM2 99 ( 2.75 x 16 Synergy DES), RCA nl, EF 45-50%.   ETOH abuse    GERD (gastroesophageal reflux disease)    History of cocaine abuse (HCC)    per pt none since 2017   History of non-ST elevation myocardial infarction (NSTEMI) 03/14/2016   s/p  PCI and DES x1   Hyperlipidemia    Hypertension    Hypertensive heart disease    Ischemic cardiomyopathy    a. 03/2016 Echo: EF 35-40%, diff H, sev inflat HK, Gr2 DD;  b. 03/2016 EF 45-50% by LV gram.;  echo 10-24-2018, ef 45-50%   Knee effusion, left 08/15/2018   Myocardial infarction [facility]) 2017   NSTEMI    OA (osteoarthritis)    left knee   Obstructive sleep apnea 06/01/2023   S/P drug eluting coronary stent placement 03/14/2016   DES x1 to OM1   Tobacco abuse    Wears dentures    upper   Wears partial dentures    lower     Current Outpatient Medications:    empagliflozin (JARDIANCE) 10 MG TABS tablet, Take 1 tablet (10 mg total) by mouth daily before breakfast., Disp: 30 tablet, Rfl: 11   amLODipine (NORVASC) 5 MG tablet, Take 1 tablet (5 mg total) by mouth daily., Disp: 30 tablet, Rfl: 11   aspirin EC 81 MG tablet, Take 81 mg by mouth daily. Swallow whole., Disp: , Rfl:    carvedilol (COREG) 3.125 MG tablet, Take 1 tablet (3.125 mg  total) by mouth 2 (two) times daily., Disp: 180 tablet, Rfl: 3   ezetimibe (ZETIA) 10 MG tablet, Take 1 tablet (10 mg total) by mouth daily., Disp: 90 tablet, Rfl: 3   losartan (COZAAR) 100 MG tablet, Take 1 tablet (100 mg total) by mouth daily., Disp: 90 tablet, Rfl: 3   nitroGLYCERIN (NITROSTAT) 0.4 MG SL tablet, Place 1 tablet (0.4 mg total) under the tongue every 5 (five) minutes as needed for chest pain., Disp: 25 tablet, Rfl: 3   rosuvastatin (CRESTOR) 40 MG tablet, Take 1 tablet (40 mg total) by mouth daily., Disp: 90 tablet, Rfl: 3  Review of Systems:    Negative except for what is stated in HPI  Physical Exam:  Vitals:   10/16/23 1356  BP: 108/75  Pulse: 81  Temp: 98.4 F (36.9 C)  TempSrc: Oral  SpO2: 94%  Weight: 170 lb 11.2 oz (77.4 kg)  Height: 5\' 6"  (1.676 m)    General: Patient is sitting comfortably in the room  Cardio: Regular rate and rhythm, no murmurs, rubs or gallops Pulmonary: Clear to ausculation bilaterally with no rales, rhonchi, and crackles  Extremities: No edema appreciated to bilateral lower extremities   Assessment & Plan:   Heart failure with mid-range ejection fraction (HFmEF) Lucas Stark) Patient  has a past medical history of heart failure with mildly reduced ejection fraction.  Most recent echo on 11/2020 showing ejection fraction of 40 to 45%.  He reports he is doing well.  He denies any episodes of chest pain or shortness of breath.  He denies any exacerbations.  He states that he is doing well.  He denies any dizziness or lightheadedness.  At previous visit at his cardiologist office on 10/12/2023 he had a thorough counseling session about which medicines to take.  Today, he is able to tell me all of his medications and states he has been taking them with no concerns.  Plan: -Continue Coreg 3.125 mg twice daily -Continue losartan 100 mg daily -Given has a better grasp of which medicines he is taking, will start Jardiance 10 mg daily -Continue to  follow-up with cardiology -If patient does develop lightheadedness with the initiation of Jardiance, can stop amlodipine  Prediabetes Most recent A1c earlier in January 2025 showing A1c of 6.4.  This puts him in the prediabetes category.  Counseled patient on diet and exercise today.  Plan: -A1c in 2 months -Counseled on lifestyle modifications  Dyslipidemia, goal LDL below 70 Patient has a past medical history of hyperlipidemia.  His current medications include Crestor 40 mg daily and Zetia 10 mg daily.  Patient had lipid panel on 09/18/2023 showing total cholesterol 179, LDL 117.  Plan was to recheck his lipids in about 7 weeks.  Plan: -Lipid panel in March -Continue Crestor 40 mg daily -Continue Zetia 10 mg daily  Need for shingles vaccine Discussed with patient about shingles vaccine.  Stated that he can get this at his pharmacy.  He states he will go to his pharmacy for this.  Patient discussed with Dr.  Mercie Stark  Lucas Slater, DO PGY-2 Internal Medicine Resident  Pager: [phone]

## 2023-10-16 ENCOUNTER — Ambulatory Visit (INDEPENDENT_AMBULATORY_CARE_PROVIDER_SITE_OTHER): Payer: 59 | Admitting: Student

## 2023-10-16 ENCOUNTER — Encounter: Payer: Self-pay | Admitting: Student

## 2023-10-16 VITALS — BP 108/75 | HR 81 | Temp 98.4°F | Ht 66.0 in | Wt 170.7 lb

## 2023-10-16 DIAGNOSIS — R7303 Prediabetes: Secondary | ICD-10-CM | POA: Diagnosis not present

## 2023-10-16 DIAGNOSIS — Z23 Encounter for immunization: Secondary | ICD-10-CM | POA: Insufficient documentation

## 2023-10-16 DIAGNOSIS — E785 Hyperlipidemia, unspecified: Secondary | ICD-10-CM | POA: Diagnosis not present

## 2023-10-16 DIAGNOSIS — I5022 Chronic systolic (congestive) heart failure: Secondary | ICD-10-CM | POA: Diagnosis not present

## 2023-10-16 MED ORDER — EMPAGLIFLOZIN 10 MG PO TABS: 10.0000 mg | ORAL_TABLET | Freq: Every day | ORAL | 11 refills | Status: DC

## 2023-10-16 NOTE — Assessment & Plan Note (Signed)
Patient has a past medical history of heart failure with mildly reduced ejection fraction.  Most recent echo on 11/2020 showing ejection fraction of 40 to 45%.  He reports he is doing well.  He denies any episodes of chest pain or shortness of breath.  He denies any exacerbations.  He states that he is doing well.  He denies any dizziness or lightheadedness.  At previous visit at his cardiologist office on 10/12/2023 he had a thorough counseling session about which medicines to take.  Today, he is able to tell me all of his medications and states he has been taking them with no concerns.  Plan: -Continue Coreg 3.125 mg twice daily -Continue losartan 100 mg daily -Given has a better grasp of which medicines he is taking, will start Jardiance 10 mg daily -Continue to follow-up with cardiology -If patient does develop lightheadedness with the initiation of Jardiance, can stop amlodipine

## 2023-10-16 NOTE — Assessment & Plan Note (Signed)
Discussed with patient about shingles vaccine.  Stated that he can get this at his pharmacy.  He states he will go to his pharmacy for this.

## 2023-10-16 NOTE — Assessment & Plan Note (Signed)
Most recent A1c earlier in January 2025 showing A1c of 6.4.  This puts him in the prediabetes category.  Counseled patient on diet and exercise today.  Plan: -A1c in 2 months -Counseled on lifestyle modifications

## 2023-10-16 NOTE — Patient Instructions (Addendum)
Lucas Stark, Divis you for allowing me to take part in your care today.  Here are your instructions.  1.  Regarding your prediabetes, please read the attachments that I have provided.  Lifestyle changes will help.  2.  Please come back in 2 months, and at this time we will check your A1c again  3.  Continue taking all your medications as prescribed.  You have an appointment coming up with your cardiologist.  4.  I am starting you on Jardiance.  Take this 10 mg daily.  If you feel lightheaded or dizzy, please call the clinic to let us know.  Thank you, Dr. Allena Katz  If you have any other questions please contact the internal medicine clinic at [phone] If it is after hours, please call the [place] hospital at [phone] and then ask the person who picks up for the resident on call.

## 2023-10-16 NOTE — Assessment & Plan Note (Signed)
Patient has a past medical history of hyperlipidemia.  His current medications include Crestor 40 mg daily and Zetia 10 mg daily.  Patient had lipid panel on 09/18/2023 showing total cholesterol 179, LDL 117.  Plan was to recheck his lipids in about 7 weeks.  Plan: -Lipid panel in March -Continue Crestor 40 mg daily -Continue Zetia 10 mg daily

## 2023-11-11 DIAGNOSIS — G4733 Obstructive sleep apnea (adult) (pediatric): Secondary | ICD-10-CM | POA: Diagnosis not present

## 2023-11-16 ENCOUNTER — Ambulatory Visit: Payer: 59 | Admitting: Student

## 2023-11-16 ENCOUNTER — Encounter: Payer: Self-pay | Admitting: Student

## 2023-11-16 ENCOUNTER — Other Ambulatory Visit: Payer: Self-pay

## 2023-11-16 VITALS — BP 127/67 | HR 63 | Temp 98.0°F | Ht 68.0 in | Wt 172.4 lb

## 2023-11-16 DIAGNOSIS — G478 Other sleep disorders: Secondary | ICD-10-CM

## 2023-11-16 DIAGNOSIS — I10 Essential (primary) hypertension: Secondary | ICD-10-CM | POA: Diagnosis not present

## 2023-11-16 DIAGNOSIS — E785 Hyperlipidemia, unspecified: Secondary | ICD-10-CM | POA: Diagnosis not present

## 2023-11-16 DIAGNOSIS — R7303 Prediabetes: Secondary | ICD-10-CM | POA: Diagnosis not present

## 2023-11-16 DIAGNOSIS — I5022 Chronic systolic (congestive) heart failure: Secondary | ICD-10-CM

## 2023-11-16 MED ORDER — NICOTINE POLACRILEX 4 MG MT GUM: 4.0000 mg | CHEWING_GUM | OROMUCOSAL | 0 refills | Status: DC | PRN

## 2023-11-16 NOTE — Patient Instructions (Signed)
 Thank you, Mr.Jaquari L Dumler for allowing [place] to provide your care today.   Your blood pressure is well-controlled today.  Keep taking your medications as prescribed.  For your nightmares and sleep paralysis, I recommend mentioning to the neurologist at your next appointment.  We can consider a referral to a sleep medicine doctor in the future if these continue.  I have sent in a prescription for nicotine gum.  You can just use a few times and took it into your cheek.  Tried to replace one of your cigarettes with this gum and see how it goes.  I have ordered the following medication/changed the following medications:   Start the following medications: Meds ordered this encounter  Medications   nicotine polacrilex (NICORETTE) 4 MG gum    Sig: Take 1 each (4 mg total) by mouth as needed for smoking cessation.    Dispense:  100 tablet    Refill:  0     Follow up: 4 weeks  We look forward to seeing you next time. Please call our clinic at [phone] if you have any questions or concerns. The best time to call is Monday-Friday from 9am-4pm, but there is someone available 24/7. If after hours or the weekend, call the main hospital number and ask for the Internal Medicine Resident On-Call. If you need medication refills, please notify your pharmacy one week in advance and they will send [place] a request.   Thank you for trusting me with your care. Wishing you the best!   Annett Fabian, MD [facility]

## 2023-11-16 NOTE — Assessment & Plan Note (Addendum)
 He mentioned that for some time now he has been having recurrent vivid dreams and occasional sleep paralysis.  He is not on any medications that typically cause this.  Denies any history of trauma. No other PTSD symptoms currently.  Suspect it may be related to frequent nighttime awakening with his OSA.  He has a neurology appointment scheduled for later this month, and I recommended he ask them what they think about it.  If the symptoms persist, recommend a referral to sleep medicine at his next appointment.

## 2023-11-16 NOTE — Progress Notes (Signed)
 CC: Routine Follow Up for HTN, HLD after last office visit 10/16/2023  HPI:  Lucas Stark is a 68 y.o. male with the PMH listed below who presents to the clinic for routine follow up. Please see assessment and plan below for further details.  Past Medical History:  Diagnosis Date   Alcohol abuse 03/12/2016   Appendicitis 04/29/2021   CAD (coronary artery disease) cardiologist-- dr Jens Som   a. 03/2016 NSTEMI/PCI: LM nl, LAD nl, RI nl, LCX nl, OM2 99 ( 2.75 x 16 Synergy DES), RCA nl, EF 45-50%.   ETOH abuse    GERD (gastroesophageal reflux disease)    History of cocaine abuse (HCC)    per pt none since 2017   History of non-ST elevation myocardial infarction (NSTEMI) 03/14/2016   s/p  PCI and DES x1   Hyperlipidemia    Hypertension    Hypertensive heart disease    Ischemic cardiomyopathy    a. 03/2016 Echo: EF 35-40%, diff H, sev inflat HK, Gr2 DD;  b. 03/2016 EF 45-50% by LV gram.;  echo 10-24-2018, ef 45-50%   Knee effusion, left 08/15/2018   Myocardial infarction [facility]) 2017   NSTEMI    OA (osteoarthritis)    left knee   Obstructive sleep apnea 06/01/2023   S/P drug eluting coronary stent placement 03/14/2016   DES x1 to OM1   Tobacco abuse    Wears dentures    upper   Wears partial dentures    lower   Review of Systems:   Pertinent items noted in HPI and/or A&P.  Physical Exam: Vitals:   11/16/23 0913  BP: 127/67  Pulse: 63  Temp: 98 F (36.7 C)  TempSrc: Oral  SpO2: 99%  Weight: 172 lb 6.4 oz (78.2 kg)  Height: 5\' 8"  (1.727 m)   Well-appearing middle-age man in no acute distress Heart rate and rhythm regular, no murmurs, euvolemic Lungs clear to auscultation bilaterally No focal neurological deficits No focal skin changes Normal mood and affect  Assessment & Plan:   Benign essential HTN BP Readings from Last 3 Encounters:  11/16/23 127/67  10/16/23 108/75  10/12/23 114/80    Blood pressure is well-controlled.  He is currently taking losartan  100 mg daily, amlodipine 5 mg daily, Jardiance 10 mg daily, Coreg 3.125 mg twice a day.  No medication changes at this time.  Heart failure with mid-range ejection fraction (HFmEF) (HCC) History of heart failure with mildly reduced ejection fraction.  Follows regularly with cardiology.  Has an appointment scheduled on 3/12 for follow-up.  He is currently on Coreg 3.125 mg twice a day, losartan 100 mg twice a day, and Jardiance 10 mg daily.  Euvolemic on exam.  No increased shortness of breath, dizziness, orthopnea.  We will continue the medications as listed above.  Dyslipidemia, goal LDL below 70 History of hyperlipidemia.  On Crestor 40 mg daily and Zetia 10 mg daily.  Discussed dietary modifications to reduce cholesterol and lipids.  Recent lipid panel 1/6 with LDL 117, cholesterol 179.  Will repeat lipid panel in 6 months.  Prediabetes Prediabetes.  Recent hemoglobin A1c in January 2025 showed an A1c of 6.4.  Discussed dietary modifications and exercise.  Spoke with the patient about metformin, but he would prefer to focus on drinking less juices and hold off on adding additional medications at this time.  We will recheck his A1c in 4 weeks.  Sleep paralysis He mentioned that for some time now he has been  having recurrent vivid dreams and occasional sleep paralysis.  He is not on any medications that typically cause this.  Denies any history of trauma. No other PTSD symptoms currently.  Suspect it may be related to frequent nighttime awakening with his OSA.  He has a neurology appointment scheduled for later this month, and I recommended he ask them what they think about it.  If the symptoms persist, recommend a referral to sleep medicine at his next appointment.    Patient discussed with Dr. Marney Doctor, MD [facility] Internal Medicine Resident PGY-1 Clinic Phone: [phone] Pager: [phone]

## 2023-11-16 NOTE — Assessment & Plan Note (Addendum)
 History of hyperlipidemia.  On Crestor 40 mg daily and Zetia 10 mg daily.  Discussed dietary modifications to reduce cholesterol and lipids.  Recent lipid panel 1/6 with LDL 117, cholesterol 179.  Will repeat lipid panel in 6 months.

## 2023-11-16 NOTE — Assessment & Plan Note (Signed)
 History of heart failure with mildly reduced ejection fraction.  Follows regularly with cardiology.  Has an appointment scheduled on 3/12 for follow-up.  He is currently on Coreg 3.125 mg twice a day, losartan 100 mg twice a day, and Jardiance 10 mg daily.  Euvolemic on exam.  No increased shortness of breath, dizziness, orthopnea.  We will continue the medications as listed above.

## 2023-11-16 NOTE — Assessment & Plan Note (Addendum)
 Prediabetes.  Recent hemoglobin A1c in January 2025 showed an A1c of 6.4.  Discussed dietary modifications and exercise.  Spoke with the patient about metformin, but he would prefer to focus on drinking less juices and hold off on adding additional medications at this time.  We will recheck his A1c in 4 weeks.

## 2023-11-16 NOTE — Assessment & Plan Note (Addendum)
 BP Readings from Last 3 Encounters:  11/16/23 127/67  10/16/23 108/75  10/12/23 114/80    Blood pressure is well-controlled.  He is currently taking losartan 100 mg daily, amlodipine 5 mg daily, Jardiance 10 mg daily, Coreg 3.125 mg twice a day.  No medication changes at this time.

## 2023-11-17 NOTE — Progress Notes (Signed)
 Internal Medicine Clinic Attending  Case discussed with the resident at the time of the visit.  We reviewed the resident's history and exam and pertinent patient test results.  I agree with the assessment, diagnosis, and plan of care documented in the resident's note.   Follow-up with neurology-sleep medicine scheduled 3/20, advised to discuss sleep symptoms at that time.

## 2023-11-17 NOTE — Addendum Note (Signed)
 Addended by: Dickie La on: 11/17/2023 09:07 AM   Modules accepted: Level of Service

## 2023-11-22 ENCOUNTER — Telehealth: Payer: Self-pay | Admitting: Student

## 2023-11-22 ENCOUNTER — Ambulatory Visit: Payer: 59 | Admitting: Physician Assistant

## 2023-11-22 NOTE — Telephone Encounter (Signed)
 Copied from CRM [id number]. Topic: Appointments - Appointment Info/Confirmation >> Nov 22, 2023  8:36 AM Maree Krabbe H wrote: Patient called and wanted to know if he had to reschedule his appointment because he was going to be late, when I came back from speaking with the clinic the patient was no longer on the line. >> Nov 22, 2023  8:48 AM Dennison Nancy wrote: Patient callback and gave him the correct office he need to reschedule for and has already been cancelled   Appt was not for the [facility] but for the   Name: Indie, Nickerson MRN: [id number]  Date: 11/22/2023 Status: Can  Time: 8:25 AM Length: 25  Visit Type: OFFICE VISIT [1004] Copay: $0.00  Provider: Azalee Course, PA Department: [facility]   [facility] at [facility] [address] [place], [place] [address] [phone]

## 2023-11-28 NOTE — Progress Notes (Unsigned)
 PATIENT: Lucas Stark DOB: [birth year]  REASON FOR VISIT: follow up HISTORY FROM: patient  No chief complaint on file.    HISTORY OF PRESENT ILLNESS:  11/28/23 ALL:  Lucas Stark is a 68 y.o. male here today for follow up for OSA on CPAP.  He was diagnosed with OSA by Dr Lucas Stark 11/2018 and advised to returns for titration study. He was seen in follow up with Dr Lucas Stark 11/2019 and discussed repeating HST. He was lost to follow up and seen again by Dr Lucas Stark 06/2023. HST performed 08/2023 and showed moderate obstructive sleep apnea with a total AHI of 16.2/hour and O2 nadir of 82%. Intermittent mild to moderate snoring was detected. AutoPAP advised.   Since,     HISTORY: (copied from Dr Teofilo Pod previous note)  Dear Dr. Versie Starks,    I saw your patient, Lucas Stark, upon your kind request in my sleep clinic today for evaluation of his prior diagnosis of obstructive sleep apnea.  The patient is unaccompanied today.  As you know, Lucas Stark is a 68 year old male with an underlying complex medical history of coronary artery disease with status post MI, ischemic cardiomyopathy, status post stent placement, arthritis, status post left partial knee replacement, alcohol use disorder, history of substance use disorder, hypertension, hyperlipidemia, smoking, and overweight state, who was previously diagnosed with obstructive sleep apnea.  He has not been on PAP therapy.  I had evaluated him over 3 years ago.  He does not report any significant sleep-related symptoms.  He is in fact not quite sure why he is here today.   His Epworth sleepiness score is 24, fatigue severity score is 9 out of 63.  I reviewed your office note from 06/01/2023.     His baseline polysomnogram from 11/11/2018 showed an AHI of 8/h, O2 nadir 83%.  He has had a little bit of weight loss since then.  Compared to 2020 he is about 7 pounds lighter.   He is not sure if he snores.  Bedtime varies anywhere between 4 and 7 PM.  He has a  TV on in his bedroom and tends to stay on all night.  He lives with his significant other.  Rise time is around 7 AM.  He drinks caffeine in limitation, about half a cup of coffee per day, occasional tea, no soda daily.  He smokes half a pack per day.  He drinks alcohol daily and denies a history of alcohol use disorder.  He drinks about 2 servings of liquor, 8 ounce size each per day.  He has not used any illicit drugs in 5 or 6 years he states.     Previously:   11/27/2019: Lucas Stark is a 68 year old right-handed gentleman with an underlying medical history of hypertension, smoking, coronary artery disease with history of non-STEMI in 2017 and history of stent placement, ischemic cardiomyopathy, history of substance abuse (per chart review) and overweight state, who presents for reevaluation of his obstructive sleep apnea.  The patient is unaccompanied today.  I first met him on 10/18/2018 at the request of his primary care physician, at which time he reported sleepiness and sleep paralysis.  He had a baseline sleep study on 11/11/2018 which showed a reduced sleep efficiency at 40.9%, sleep latency was markedly delayed, REM latency 48 minutes, REM percentage was normal at 19.3%.  Total AHI was 8/h, REM AHI was 18.5/h, O2 nadir was 83%.  He was advised to return for a CPAP titration study to  treat his mild to moderate obstructive sleep apnea.  He did not return for a second study and was lost to follow-up.  He presents for reevaluation.   11/27/2019: he reports no new symptoms, continues to have sleep paralysis episodes when he sleeps during the day. He sleeps without a set schedule, and reports there is nothing to do.  His Epworth sleepiness score is 10 out of 24.  He may be in bed by 2 PM, may sleep all day, girlfriend works second shift.  He denies any symptoms of depression.  He does not work.  He has nocturia about once or twice per average night.   10/18/18: (He) reports difficulty waking up, sleep paralysis,  and excessive daytime somnolence. I reviewed your office note from 07/27/2018, which you kindly included.  Lives with GF, has been noted to snoring, he feels like he has to wake himself up. GF works 2nd shift.  His BT varies, no set time, rise time usually 5:30 or 6. No night to night nocturia, no AM HAs. He sees cardiology, has appt tomorrow, has had CP on the right recently, feels muscular to him, different from when he had MI. He has L partial knee replacement planned in March. No FHx of OSA. He smokes one pack per day, drinks alcohol on the weekend, denies any illicit drug use and denies caffeine use. He is not aware of any family history of OSA.   REVIEW OF SYSTEMS: Out of a complete 14 system review of symptoms, the patient complains only of the following symptoms, and all other reviewed systems are negative.  ESS:  ALLERGIES: Allergies  Allergen Reactions   Advil [Ibuprofen]     " I cant take, I had a stent put in, I have to take tylenol"   Other Itching    Prescription Sleeping pills, Unsure of the name of the sleeping pill   Oxycodone     Nervousness, sweaty, itchy    HOME MEDICATIONS: Outpatient Medications Prior to Visit  Medication Sig Dispense Refill   amLODipine (NORVASC) 5 MG tablet Take 1 tablet (5 mg total) by mouth daily. 30 tablet 11   aspirin EC 81 MG tablet Take 81 mg by mouth daily. Swallow whole.     carvedilol (COREG) 3.125 MG tablet Take 1 tablet (3.125 mg total) by mouth 2 (two) times daily. 180 tablet 3   empagliflozin (JARDIANCE) 10 MG TABS tablet Take 1 tablet (10 mg total) by mouth daily before breakfast. 30 tablet 11   ezetimibe (ZETIA) 10 MG tablet Take 1 tablet (10 mg total) by mouth daily. 90 tablet 3   losartan (COZAAR) 100 MG tablet Take 1 tablet (100 mg total) by mouth daily. 90 tablet 3   nicotine polacrilex (NICORETTE) 4 MG gum Take 1 each (4 mg total) by mouth as needed for smoking cessation. 100 tablet 0   nitroGLYCERIN (NITROSTAT) 0.4 MG SL  tablet Place 1 tablet (0.4 mg total) under the tongue every 5 (five) minutes as needed for chest pain. 25 tablet 3   rosuvastatin (CRESTOR) 40 MG tablet Take 1 tablet (40 mg total) by mouth daily. 90 tablet 3   No facility-administered medications prior to visit.    PAST MEDICAL HISTORY: Past Medical History:  Diagnosis Date   Alcohol abuse 03/12/2016   Appendicitis 04/29/2021   CAD (coronary artery disease) cardiologist-- dr Jens Som   a. 03/2016 NSTEMI/PCI: LM nl, LAD nl, RI nl, LCX nl, OM2 99 ( 2.75 x 16 Synergy DES), RCA nl,  EF 45-50%.   ETOH abuse    GERD (gastroesophageal reflux disease)    History of cocaine abuse (HCC)    per pt none since 2017   History of non-ST elevation myocardial infarction (NSTEMI) 03/14/2016   s/p  PCI and DES x1   Hyperlipidemia    Hypertension    Hypertensive heart disease    Ischemic cardiomyopathy    a. 03/2016 Echo: EF 35-40%, diff H, sev inflat HK, Gr2 DD;  b. 03/2016 EF 45-50% by LV gram.;  echo 10-24-2018, ef 45-50%   Knee effusion, left 08/15/2018   Myocardial infarction [facility]) 2017   NSTEMI    OA (osteoarthritis)    left knee   Obstructive sleep apnea 06/01/2023   S/P drug eluting coronary stent placement 03/14/2016   DES x1 to OM1   Tobacco abuse    Wears dentures    upper   Wears partial dentures    lower    PAST SURGICAL HISTORY: Past Surgical History:  Procedure Laterality Date   CARDIAC CATHETERIZATION N/A 03/14/2016   Procedure: Left Heart Cath and Coronary Angiography;  Surgeon: Kathleene Hazel, MD;  Location: [facility] INVASIVE CV LAB;  Service: Cardiovascular;  Laterality: N/A;   CORONARY ANGIOPLASTY WITH STENT PLACEMENT     FACIAL COSMETIC SURGERY     at age 47 yrs old   LACERATION REPAIR  age 51   face around eye   LAPAROSCOPIC APPENDECTOMY N/A 04/29/2021   Procedure: APPENDECTOMY LAPAROSCOPIC;  Surgeon: Axel Filler, MD;  Location: [facility] OR;  Service: General;  Laterality: N/A;   PARTIAL KNEE ARTHROPLASTY Left 11/13/2018    Procedure: UNICOMPARTMENTAL KNEE;  Surgeon: Sheral Apley, MD;  Location: [facility] ORS;  Service: Orthopedics;  Laterality: Left;   WRIST SURGERY Left 01/31/2020    FAMILY HISTORY: Family History  Problem Relation Age of Onset   Heart attack Father    Heart attack Sister    Colon polyps Maternal Uncle    Colon cancer Neg Hx    Esophageal cancer Neg Hx    Stomach cancer Neg Hx    Rectal cancer Neg Hx     SOCIAL HISTORY: Social History   Socioeconomic History   Marital status: Legally Separated    Spouse name: Not on file   Number of children: 8   Years of education: Not on file   Highest education level: Not on file  Occupational History   Not on file  Tobacco Use   Smoking status: Some Days    Current packs/day: 0.50    Average packs/day: 0.5 packs/day for 35.0 years (17.5 ttl pk-yrs)    Types: Cigarettes   Smokeless tobacco: Never   Tobacco comments:    some days less  6  per day   Vaping Use   Vaping status: Never Used  Substance and Sexual Activity   Alcohol use: Yes    Comment: 1/3 pint daily   Drug use: Not Currently    Types: Cocaine    Comment: 11-06-2018 per pt last cocaine "when I had my heart attack"  2017   Sexual activity: Not Currently  Other Topics Concern   Not on file  Social History Narrative   Caffiene 1/2 cup coffee daily   Retired:  [employer]      Social Drivers of Corporate investment banker Strain: Low Risk  (11/22/2022)   Overall Financial Resource Strain (CARDIA)    Difficulty of Paying Living Expenses: Not very hard  Food Insecurity: No Food Insecurity (  10/16/2023)   Hunger Vital Sign    Worried About Running Out of Food in the Last Year: Never true    Ran Out of Food in the Last Year: Never true  Transportation Needs: No Transportation Needs (11/22/2022)   PRAPARE - Administrator, Civil Service (Medical): No    Lack of Transportation (Non-Medical): No  Physical Activity: Insufficiently Active (11/22/2022)   Exercise  Vital Sign    Days of Exercise per Week: 1 day    Minutes of Exercise per Session: 10 min  Stress: No Stress Concern Present (11/22/2022)   [employer] of Occupational Health - Occupational Stress Questionnaire    Feeling of Stress : Only a little  Social Connections: Not on File (05/27/2023)   Received from [facility]   Social Connections    Connectedness: 0  Intimate Partner Violence: Not At Risk (10/16/2023)   Humiliation, Afraid, Rape, and Kick questionnaire    Fear of Current or Ex-Partner: No    Emotionally Abused: No    Physically Abused: No    Sexually Abused: No     PHYSICAL EXAM  There were no vitals filed for this visit. There is no height or weight on file to calculate BMI.  Generalized: Well developed, in no acute distress  Cardiology: normal rate and rhythm, no murmur noted Respiratory: clear to auscultation bilaterally  Neurological examination  Mentation: Alert oriented to time, place, history taking. Follows all commands speech and language fluent Cranial nerve II-XII: Pupils were equal round reactive to light. Extraocular movements were full, visual field were full  Motor: The motor testing reveals 5 over 5 strength of all 4 extremities. Good symmetric motor tone is noted throughout.  Gait and station: Gait is normal.    DIAGNOSTIC DATA (LABS, IMAGING, TESTING) - I reviewed patient records, labs, notes, testing and imaging myself where available.      No data to display           Lab Results  Component Value Date   WBC 4.1 07/04/2023   HGB 18.5 (H) 07/04/2023   HCT 55.5 (H) 07/04/2023   MCV 93 07/04/2023   PLT 206 07/04/2023      Component Value Date/Time   NA 146 (H) 09/18/2023 1112   K 4.8 09/18/2023 1112   CL 106 09/18/2023 1112   CO2 27 09/18/2023 1112   GLUCOSE 96 09/18/2023 1112   GLUCOSE 109 (H) 04/29/2021 0017   BUN 12 09/18/2023 1112   CREATININE 0.94 09/18/2023 1112   CREATININE 1.03 03/23/2016 1433   CALCIUM 9.2 09/18/2023 1112    PROT 6.8 11/19/2021 1448   ALBUMIN 4.5 11/19/2021 1448   AST 20 11/19/2021 1448   ALT 11 11/19/2021 1448   ALKPHOS 80 11/19/2021 1448   BILITOT 0.6 11/19/2021 1448   GFRNONAA >60 04/29/2021 0017   GFRAA 86 02/18/2020 1024   Lab Results  Component Value Date   CHOL 179 09/18/2023   HDL 42 09/18/2023   LDLCALC 117 (H) 09/18/2023   TRIG 110 09/18/2023   CHOLHDL 4.3 09/18/2023   Lab Results  Component Value Date   HGBA1C 6.4 (H) 09/18/2023   No results found for: "VITAMINB12" No results found for: "TSH"   ASSESSMENT AND PLAN 68 y.o. year old male  has a past medical history of Alcohol abuse (03/12/2016), Appendicitis (04/29/2021), CAD (coronary artery disease) (cardiologist-- dr Jens Som), ETOH abuse, GERD (gastroesophageal reflux disease), History of cocaine abuse (HCC), History of non-ST elevation myocardial infarction (NSTEMI) (03/14/2016), Hyperlipidemia,  Hypertension, Hypertensive heart disease, Ischemic cardiomyopathy, Knee effusion, left (08/15/2018), Myocardial infarction (HCC) (2017), OA (osteoarthritis), Obstructive sleep apnea (06/01/2023), S/P drug eluting coronary stent placement (03/14/2016), Tobacco abuse, Wears dentures, and Wears partial dentures. here with   No diagnosis found.    Livia Snellen is doing well on CPAP therapy. Compliance report reveals ***. *** was encouraged to continue using CPAP nightly and for greater than 4 hours each night. We will update supply orders as indicated. Risks of untreated sleep apnea review and education materials provided. Healthy lifestyle habits encouraged. *** will follow up in ***, sooner if needed. *** verbalizes understanding and agreement with this plan.    No orders of the defined types were placed in this encounter.    No orders of the defined types were placed in this encounter.     Shawnie Dapper, FNP-C 11/28/2023, 9:43 AM [facility] [address] [place], [place] [address] [phone]

## 2023-11-28 NOTE — Patient Instructions (Incomplete)

## 2023-11-29 NOTE — Progress Notes (Unsigned)
 Marland Kitchen

## 2023-11-30 ENCOUNTER — Ambulatory Visit (INDEPENDENT_AMBULATORY_CARE_PROVIDER_SITE_OTHER): Payer: 59 | Admitting: Family Medicine

## 2023-11-30 ENCOUNTER — Encounter: Payer: Self-pay | Admitting: Family Medicine

## 2023-11-30 VITALS — BP 138/80 | HR 63 | Ht 62.0 in | Wt 169.0 lb

## 2023-11-30 DIAGNOSIS — G4733 Obstructive sleep apnea (adult) (pediatric): Secondary | ICD-10-CM | POA: Diagnosis not present

## 2023-11-30 NOTE — Progress Notes (Addendum)
 PATIENT: Lucas Stark DOB: Sep 24, 1955  REASON FOR VISIT: follow up HISTORY FROM: patient  Chief Complaint  Patient presents with   Follow-up    Pt in 2 Pt here for cpap f/u Pt states have difficulty wearing mask due  to partial      HISTORY OF PRESENT ILLNESS:  11/30/23 ALL:  Lucas Stark is a 68 y.o. male here today for follow up for OSA on CPAP.  He was diagnosed with OSA by Dr Frances Furbish 11/2018 and advised to returns for titration study. He was seen in follow up with Dr Frances Furbish 11/2019 and discussed repeating HST. He was lost to follow up and seen again by Dr Frances Furbish 06/2023. HST performed 08/2023 and showed moderate obstructive sleep apnea with a total AHI of 16.2/hour and O2 nadir of 82%. Intermittent mild to moderate snoring was detected. AutoPAP advised.  He is here today for a follow-up on his AutoPAP use. Data from 09/27/23 to 11/25/23 shows that he used the device for 35 out of 60 days, which accounts for 58% of the time. However, he used it for more than 4 hours on only 2 days (3% of the time). The device was set to [employer] with a pressure range of 6-12 cmH2O. The leak rate at the 95th percentile was 3.9, and his AHI was 2.7, which is within an acceptable range.  The patient reports that the mask tends to leak unless he puts it on tightly. When the mask is tightened, the pressure irritates his lower gums where during the day he wears a partial denture for his lower teeth. He has only one mask (though he has three of the same type). The patient does not report any other issues with the device and has not noticed a significant difference in how he feels when using the machine. He was unclear about when to use the machine as well as the machines purpose. The patient also inquired about a different type of CPAP machine that stays strapped to the shoulder, but I am unfamiliar with this model and could not find any information about it.  Since,     HISTORY: (copied from Dr Teofilo Pod previous  note)  Dear Dr. Versie Starks,    I saw your patient, Lucas Stark, upon your kind request in my sleep clinic today for evaluation of his prior diagnosis of obstructive sleep apnea.  The patient is unaccompanied today.  As you know, Lucas Stark is a 68 year old male with an underlying complex medical history of coronary artery disease with status post MI, ischemic cardiomyopathy, status post stent placement, arthritis, status post left partial knee replacement, alcohol use disorder, history of substance use disorder, hypertension, hyperlipidemia, smoking, and overweight state, who was previously diagnosed with obstructive sleep apnea.  He has not been on PAP therapy.  I had evaluated him over 3 years ago.  He does not report any significant sleep-related symptoms.  He is in fact not quite sure why he is here today.   His Epworth sleepiness score is 24, fatigue severity score is 9 out of 63.  I reviewed your office note from 06/01/2023.     His baseline polysomnogram from 11/11/2018 showed an AHI of 8/h, O2 nadir 83%.  He has had a little bit of weight loss since then.  Compared to 2020 he is about 7 pounds lighter.   He is not sure if he snores.  Bedtime varies anywhere between 4 and 7 PM.  He has a TV on  in his bedroom and tends to stay on all night.  He lives with his significant other.  Rise time is around 7 AM.  He drinks caffeine in limitation, about half a cup of coffee per day, occasional tea, no soda daily.  He smokes half a pack per day.  He drinks alcohol daily and denies a history of alcohol use disorder.  He drinks about 2 servings of liquor, 8 ounce size each per day.  He has not used any illicit drugs in 5 or 6 years he states.     Previously:   11/27/2019: Lucas Stark is a 68 year old right-handed gentleman with an underlying medical history of hypertension, smoking, coronary artery disease with history of non-STEMI in 2017 and history of stent placement, ischemic cardiomyopathy, history of substance  abuse (per chart review) and overweight state, who presents for reevaluation of his obstructive sleep apnea.  The patient is unaccompanied today.  I first met him on 10/18/2018 at the request of his primary care physician, at which time he reported sleepiness and sleep paralysis.  He had a baseline sleep study on 11/11/2018 which showed a reduced sleep efficiency at 40.9%, sleep latency was markedly delayed, REM latency 48 minutes, REM percentage was normal at 19.3%.  Total AHI was 8/h, REM AHI was 18.5/h, O2 nadir was 83%.  He was advised to return for a CPAP titration study to treat his mild to moderate obstructive sleep apnea.  He did not return for a second study and was lost to follow-up.  He presents for reevaluation.   11/27/2019: he reports no new symptoms, continues to have sleep paralysis episodes when he sleeps during the day. He sleeps without a set schedule, and reports there is nothing to do.  His Epworth sleepiness score is 10 out of 24.  He may be in bed by 2 PM, may sleep all day, girlfriend works second shift.  He denies any symptoms of depression.  He does not work.  He has nocturia about once or twice per average night.   10/18/18: (He) reports difficulty waking up, sleep paralysis, and excessive daytime somnolence. I reviewed your office note from 07/27/2018, which you kindly included.  Lives with GF, has been noted to snoring, he feels like he has to wake himself up. GF works 2nd shift.  His BT varies, no set time, rise time usually 5:30 or 6. No night to night nocturia, no AM HAs. He sees cardiology, has appt tomorrow, has had CP on the right recently, feels muscular to him, different from when he had MI. He has L partial knee replacement planned in March. No FHx of OSA. He smokes one pack per day, drinks alcohol on the weekend, denies any illicit drug use and denies caffeine use. He is not aware of any family history of OSA.   REVIEW OF SYSTEMS: Out of a complete 14 system review of  symptoms, the patient complains only of the following symptoms, and all other reviewed systems are negative.  ESS:  ALLERGIES: Allergies  Allergen Reactions   Advil [Ibuprofen]     " I cant take, I had a stent put in, I have to take tylenol"   Other Itching    Prescription Sleeping pills, Unsure of the name of the sleeping pill   Oxycodone     Nervousness, sweaty, itchy    HOME MEDICATIONS: Outpatient Medications Prior to Visit  Medication Sig Dispense Refill   amLODipine (NORVASC) 5 MG tablet Take 1 tablet (5 mg total)  by mouth daily. 30 tablet 11   aspirin EC 81 MG tablet Take 81 mg by mouth daily. Swallow whole.     carvedilol (COREG) 3.125 MG tablet Take 1 tablet (3.125 mg total) by mouth 2 (two) times daily. 180 tablet 3   empagliflozin (JARDIANCE) 10 MG TABS tablet Take 1 tablet (10 mg total) by mouth daily before breakfast. 30 tablet 11   ezetimibe (ZETIA) 10 MG tablet Take 1 tablet (10 mg total) by mouth daily. 90 tablet 3   losartan (COZAAR) 100 MG tablet Take 1 tablet (100 mg total) by mouth daily. 90 tablet 3   nicotine polacrilex (NICORETTE) 4 MG gum Take 1 each (4 mg total) by mouth as needed for smoking cessation. 100 tablet 0   nitroGLYCERIN (NITROSTAT) 0.4 MG SL tablet Place 1 tablet (0.4 mg total) under the tongue every 5 (five) minutes as needed for chest pain. 25 tablet 3   rosuvastatin (CRESTOR) 40 MG tablet Take 1 tablet (40 mg total) by mouth daily. 90 tablet 3   No facility-administered medications prior to visit.    PAST MEDICAL HISTORY: Past Medical History:  Diagnosis Date   Alcohol abuse 03/12/2016   Appendicitis 04/29/2021   CAD (coronary artery disease) cardiologist-- dr Jens Som   a. 03/2016 NSTEMI/PCI: LM nl, LAD nl, RI nl, LCX nl, OM2 99 ( 2.75 x 16 Synergy DES), RCA nl, EF 45-50%.   ETOH abuse    GERD (gastroesophageal reflux disease)    History of cocaine abuse (HCC)    per pt none since 2017   History of non-ST elevation myocardial  infarction (NSTEMI) 03/14/2016   s/p  PCI and DES x1   Hyperlipidemia    Hypertension    Hypertensive heart disease    Ischemic cardiomyopathy    a. 03/2016 Echo: EF 35-40%, diff H, sev inflat HK, Gr2 DD;  b. 03/2016 EF 45-50% by LV gram.;  echo 10-24-2018, ef 45-50%   Knee effusion, left 08/15/2018   Myocardial infarction [facility]) 2017   NSTEMI    OA (osteoarthritis)    left knee   Obstructive sleep apnea 06/01/2023   S/P drug eluting coronary stent placement 03/14/2016   DES x1 to OM1   Tobacco abuse    Wears dentures    upper   Wears partial dentures    lower    PAST SURGICAL HISTORY: Past Surgical History:  Procedure Laterality Date   CARDIAC CATHETERIZATION N/A 03/14/2016   Procedure: Left Heart Cath and Coronary Angiography;  Surgeon: Kathleene Hazel, MD;  Location: [facility] INVASIVE CV LAB;  Service: Cardiovascular;  Laterality: N/A;   CORONARY ANGIOPLASTY WITH STENT PLACEMENT     FACIAL COSMETIC SURGERY     at age 26 yrs old   LACERATION REPAIR  age 12   face around eye   LAPAROSCOPIC APPENDECTOMY N/A 04/29/2021   Procedure: APPENDECTOMY LAPAROSCOPIC;  Surgeon: Axel Filler, MD;  Location: [facility] OR;  Service: General;  Laterality: N/A;   PARTIAL KNEE ARTHROPLASTY Left 11/13/2018   Procedure: UNICOMPARTMENTAL KNEE;  Surgeon: Sheral Apley, MD;  Location: [facility] ORS;  Service: Orthopedics;  Laterality: Left;   WRIST SURGERY Left 01/31/2020    FAMILY HISTORY: Family History  Problem Relation Age of Onset   Heart attack Father    Heart attack Sister    Colon polyps Maternal Uncle    Colon cancer Neg Hx    Esophageal cancer Neg Hx    Stomach cancer Neg Hx    Rectal cancer Neg Hx  Sleep apnea Neg Hx     SOCIAL HISTORY: Social History   Socioeconomic History   Marital status: Legally Separated    Spouse name: Not on file   Number of children: 8   Years of education: Not on file   Highest education level: Not on file  Occupational History   Not on file  Tobacco  Use   Smoking status: Some Days    Current packs/day: 0.50    Average packs/day: 0.5 packs/day for 35.0 years (17.5 ttl pk-yrs)    Types: Cigarettes   Smokeless tobacco: Never   Tobacco comments:    some days less  6  per day   Vaping Use   Vaping status: Never Used  Substance and Sexual Activity   Alcohol use: Yes    Alcohol/week: 5.0 standard drinks of alcohol    Types: 1 Cans of beer, 4 Shots of liquor per week    Comment: 1/3 pint daily   Drug use: Not Currently    Types: Cocaine    Comment: 11-06-2018 per pt last cocaine "when I had my heart attack"  2017   Sexual activity: Not Currently  Other Topics Concern   Not on file  Social History Narrative   Caffiene 1/2 cup coffee daily   Retired:  [employer]      Social Drivers of Corporate investment banker Strain: Low Risk  (11/22/2022)   Overall Financial Resource Strain (CARDIA)    Difficulty of Paying Living Expenses: Not very hard  Food Insecurity: No Food Insecurity (10/16/2023)   Hunger Vital Sign    Worried About Running Out of Food in the Last Year: Never true    Ran Out of Food in the Last Year: Never true  Transportation Needs: No Transportation Needs (11/22/2022)   PRAPARE - Administrator, Civil Service (Medical): No    Lack of Transportation (Non-Medical): No  Physical Activity: Insufficiently Active (11/22/2022)   Exercise Vital Sign    Days of Exercise per Week: 1 day    Minutes of Exercise per Session: 10 min  Stress: No Stress Concern Present (11/22/2022)   [employer] of Occupational Health - Occupational Stress Questionnaire    Feeling of Stress : Only a little  Social Connections: Not on File (05/27/2023)   Received from [facility]   Social Connections    Connectedness: 0  Intimate Partner Violence: Not At Risk (10/16/2023)   Humiliation, Afraid, Rape, and Kick questionnaire    Fear of Current or Ex-Partner: No    Emotionally Abused: No    Physically Abused: No    Sexually Abused: No      PHYSICAL EXAM  Vitals:   11/30/23 0853  BP: 138/80  Pulse: 63  Weight: 169 lb (76.7 kg)  Height: 5\' 2"  (1.575 m)   Body mass index is 30.91 kg/m.  Generalized: Well developed, in no acute distress  Cardiology: normal rate and rhythm, no murmur noted Respiratory: clear to auscultation bilaterally  Neurological examination  Mentation: Alert oriented to time, place, history taking. Follows all commands speech and language fluent Cranial nerve II-XII: Pupils were equal round reactive to light. Extraocular movements were full, visual field were full  Motor: The motor testing reveals 5 over 5 strength of all 4 extremities. Good symmetric motor tone is noted throughout.  Gait and station: Gait is normal.    DIAGNOSTIC DATA (LABS, IMAGING, TESTING) - I reviewed patient records, labs, notes, testing and imaging myself where available.  No data to display           Lab Results  Component Value Date   WBC 4.1 07/04/2023   HGB 18.5 (H) 07/04/2023   HCT 55.5 (H) 07/04/2023   MCV 93 07/04/2023   PLT 206 07/04/2023      Component Value Date/Time   NA 146 (H) 09/18/2023 1112   K 4.8 09/18/2023 1112   CL 106 09/18/2023 1112   CO2 27 09/18/2023 1112   GLUCOSE 96 09/18/2023 1112   GLUCOSE 109 (H) 04/29/2021 0017   BUN 12 09/18/2023 1112   CREATININE 0.94 09/18/2023 1112   CREATININE 1.03 03/23/2016 1433   CALCIUM 9.2 09/18/2023 1112   PROT 6.8 11/19/2021 1448   ALBUMIN 4.5 11/19/2021 1448   AST 20 11/19/2021 1448   ALT 11 11/19/2021 1448   ALKPHOS 80 11/19/2021 1448   BILITOT 0.6 11/19/2021 1448   GFRNONAA >60 04/29/2021 0017   GFRAA 86 02/18/2020 1024   Lab Results  Component Value Date   CHOL 179 09/18/2023   HDL 42 09/18/2023   LDLCALC 117 (H) 09/18/2023   TRIG 110 09/18/2023   CHOLHDL 4.3 09/18/2023   Lab Results  Component Value Date   HGBA1C 6.4 (H) 09/18/2023   No results found for: "VITAMINB12" No results found for: "TSH"   ASSESSMENT AND  PLAN 68 y.o. year old male  has a past medical history of Alcohol abuse (03/12/2016), Appendicitis (04/29/2021), CAD (coronary artery disease) (cardiologist-- dr Jens Som), ETOH abuse, GERD (gastroesophageal reflux disease), History of cocaine abuse (HCC), History of non-ST elevation myocardial infarction (NSTEMI) (03/14/2016), Hyperlipidemia, Hypertension, Hypertensive heart disease, Ischemic cardiomyopathy, Knee effusion, left (08/15/2018), Myocardial infarction (HCC) (2017), OA (osteoarthritis), Obstructive sleep apnea (06/01/2023), S/P drug eluting coronary stent placement (03/14/2016), Tobacco abuse, Wears dentures, and Wears partial dentures. here with     ICD-10-CM   1. OSA (obstructive sleep apnea)  G47.33 For home use only DME continuous positive airway pressure (CPAP)        Livia Snellen is poorly complying with his current AutoPaP therapy at this time due to an oral irritation with use. He expresses a desire to continue therapy and is willing to explore other options to prevent oral irritation. He was encouraged to continue using AutoPAP nightly and for greater than 4 hours each night. We will update supply orders as indicated. Risks of untreated sleep apnea review and education materials provided. Healthy lifestyle habits encouraged.  - Consider a different mask type, Nasal Pillow. Ordered to DME  - Provided additional education regarding the proper use of the AutoPAP, ensuring the patient understands when to use the machine and the expected duration of use. - Encouraged the patient to continue regular use of the AutoPAP device, aiming for increased nightly use and consistent therapy adherence. He will follow up in 6 months or sooner if needed. The patient verbalizes understanding and agreement with this plan.   Orders Placed This Encounter  Procedures   For home use only DME continuous positive airway pressure (CPAP)    Mask refitting, please try nasal pillow    Length of Need:    Lifetime    Patient has OSA or probable OSA:   Yes    Is the patient currently using CPAP in the home:   Yes    Settings:   Other see comments    CPAP supplies needed:   Mask, headgear, cushions, filters, heated tubing and water chamber     No orders of the defined  types were placed in this encounter.

## 2023-12-14 ENCOUNTER — Ambulatory Visit: Attending: Physician Assistant | Admitting: Physician Assistant

## 2023-12-14 ENCOUNTER — Encounter: Payer: Self-pay | Admitting: Physician Assistant

## 2023-12-14 VITALS — BP 90/60 | HR 65 | Ht 65.0 in | Wt 164.0 lb

## 2023-12-14 DIAGNOSIS — I10 Essential (primary) hypertension: Secondary | ICD-10-CM | POA: Diagnosis not present

## 2023-12-14 DIAGNOSIS — E785 Hyperlipidemia, unspecified: Secondary | ICD-10-CM

## 2023-12-14 DIAGNOSIS — I2510 Atherosclerotic heart disease of native coronary artery without angina pectoris: Secondary | ICD-10-CM | POA: Diagnosis not present

## 2023-12-14 DIAGNOSIS — I5042 Chronic combined systolic (congestive) and diastolic (congestive) heart failure: Secondary | ICD-10-CM

## 2023-12-14 MED ORDER — AMLODIPINE BESYLATE 2.5 MG PO TABS
2.5000 mg | ORAL_TABLET | Freq: Every day | ORAL | Status: DC
Start: 2023-12-14 — End: 2024-04-23

## 2023-12-14 NOTE — Patient Instructions (Signed)
 Medication Instructions:  DECREASE AMLODIPINE TO 2.5 MG DAILY *If you need a refill on your cardiac medications before your next appointment, please call your pharmacy*  Lab Work: NO LABS If you have labs (blood work) drawn today and your tests are completely normal, you will receive your results only by: MyChart Message (if you have MyChart) OR A paper copy in the mail If you have any lab test that is abnormal or we need to change your treatment, we will call you to review the results.  Testing/Procedures: NO TESTING  Follow-Up: At [facility], you and your health needs are our priority.  As part of our continuing mission to provide you with exceptional heart care, our providers are all part of one team.  This team includes your primary Cardiologist (physician) and Advanced Practice Providers or APPs (Physician Assistants and Nurse Practitioners) who all work together to provide you with the care you need, when you need it.  Your next appointment:   4-6 month(s)  Provider:   Olga Millers, MD   We recommend signing up for the patient portal called "MyChart".  Sign up information is provided on this After Visit Summary.  MyChart is used to connect with patients for Virtual Visits (Telemedicine).  Patients are able to view lab/test results, encounter notes, upcoming appointments, etc.  Non-urgent messages can be sent to your provider as well.   To learn more about what you can do with MyChart, go to [email or web address].   Other Instructions   1st Floor: - Lobby - Registration  - Pharmacy  - Lab - Cafe  2nd Floor: - PV Lab - Diagnostic Testing (echo, CT, nuclear med)  3rd Floor: - Vacant  4th Floor: - TCTS (cardiothoracic surgery) - AFib Clinic - Structural Heart Clinic - Vascular Surgery  - Vascular Ultrasound  5th Floor: - HeartCare Cardiology (general and EP) - Clinical Pharmacy for coumadin, hypertension, lipid, weight-loss medications, and med  management appointments    Valet parking services will be available as well.

## 2023-12-14 NOTE — Progress Notes (Signed)
 Cardiology Office Note:  .   Date:  12/14/2023  ID:  Lucas Stark, DOB 1955-11-11, MRN [id number] PCP: Annett Fabian, MD  [facility] Providers Cardiologist:  Olga Millers, MD     History of Present Illness: .   Lucas Stark is a 68 y.o. male with a hx of CAD, chronic combined systolic and diastolic heart failure, ischemic cardiomyopathy, hypertension, hyperlipidemia, GERD, former polysubstance abuse (alcohol and cocaine), osteoarthritis and tobacco use.  Patient was admitted in July 2017 in the setting of NSTEMI.  Cardiac catheterization at that time revealed 99% OM2 treated with DES, otherwise normal coronary arteries, mild LV dysfunction.  Echocardiogram in July 2017 showed EF 35 to 40%, grade 2 DD.  He did not tolerate ACE inhibitor due to cough.  Stress test in February 2019 showed prior inferior infarct, no ischemia, EF 34%.  Most recent echocardiogram in March 2022 showed EF 40 to 45%, grade 1 DD, mild MR.   I last saw the patient April 2023 at which time he was doing well.  He was most recently by Robet Leu on 10/12/2023 at which time he was feeling unwell after receiving the new pill pack and therefore stopped taking the medication.  He was only taking amlodipine and losartan on the last visit.  He was restarted on aspirin, Zetia, carvedilol and Crestor.   Since restarting the medication, he has been doing well.  He denies any discomfort.  Blood pressure initial arrival was borderline low.  However repeat blood pressure by myself showed the blood pressure is actually low normal in the 100 range.  I will reduce his amlodipine to 2.5 mg daily.  He has no significant lower extremity edema, apnea or PND.  He can follow-up in 4-6 months.  ROS:   He denies chest pain, palpitations, dyspnea, pnd, orthopnea, n, v, dizziness, syncope, edema, weight gain, or early satiety. All other systems reviewed and are otherwise negative except as noted above.    Studies Reviewed: .         Cardiac Studies & Procedures   ______________________________________________________________________________________________ CARDIAC CATHETERIZATION  CARDIAC CATHETERIZATION 03/14/2016  Narrative  2nd Mrg lesion, 99% stenosed. Post intervention, there is a 0% residual stenosis.  There is mild left ventricular systolic dysfunction.  1. Single vessel CAD with severe stenosis first obtuse marginal branch 2. Successful PTCA/DES x 1 first obtuse marginal branch 3. No obstructive disease in the RCA or LAD 4. Mild segmental LV systolic dysfunction  Recommendations: Will continue ASA and Brilinta for one year post PCI. Continue statin. No beta blocker with cocaine use. Likely discharge home tomorrow am.  Findings Coronary Findings Diagnostic  Dominance: Right  Left Anterior Descending . Vessel is large.  First Diagonal Branch The vessel is moderate in size.  Second Diagonal Branch The vessel is moderate in size.  Third Diagonal Branch The vessel is moderate in size.  Ramus Intermedius . Vessel is small.  Left Circumflex . Vessel is large.  Second Obtuse Marginal Branch The vessel is large in size. Discrete.  Right Coronary Artery . Vessel is large. Vessel is angiographically normal.  Right Posterior Descending Artery The vessel is moderate in size.  Intervention  2nd Mrg lesion PCI The pre-interventional distal flow is normal (TIMI 3). Pre-stent angioplasty was performed. A drug-eluting stent was placed. The strut is apposed. Post-stent angioplasty was performed. The post-interventional distal flow is normal (TIMI 3). The intervention was successful. No complications occurred at this lesion. There is a 0% residual stenosis  post intervention.   STRESS TESTS  MYOCARDIAL PERFUSION IMAGING 11/07/2017  Narrative  The left ventricular ejection fraction is moderately decreased (30-44%).  Nuclear stress EF: 34%.  Blood pressure demonstrated a normal response to  exercise.  There was no ST segment deviation noted during stress.  Findings consistent with prior myocardial infarction.  This is an intermediate risk study.  Inferior wall infarct at mid and apical level no ischemia Diffuse hypokinesis EF 34%   ECHOCARDIOGRAM  ECHOCARDIOGRAM COMPLETE 11/11/2020  Narrative ECHOCARDIOGRAM REPORT    Patient Name:   Lucas Stark Date of Exam: 11/11/2020 Medical Rec #:  [id number]      Height:       66.0 in Accession #:    [id number]     Weight:       185.6 lb Date of Birth:  [birth year]      BSA:          1.937 m Patient Age:    65 years       BP:           146/98 mmHg Patient Gender: M              HR:           60 bpm. Exam Location:  [place]  Procedure: 2D Echo, 3D Echo, Cardiac Doppler and Color Doppler  Indications:    I25.5 Ischemic cardiomyopathy; PreOp  History:        Patient has prior history of Echocardiogram examinations, most recent 10/24/2018. CAD and Previous Myocardial Infarction, Preop; Risk Factors:Current Smoker and ETOH/DRUG ABUSE.  Sonographer:    Joaquim Nam Referring Phys: [id number] JAMES HOCHREIN  IMPRESSIONS   1. Left ventricular ejection fraction, by estimation, is 40 to 45%. The left ventricle has mildly decreased function. The left ventricle demonstrates global hypokinesis. Left ventricular diastolic parameters are consistent with Grade I diastolic dysfunction (impaired relaxation). 2. Right ventricular systolic function is normal. The right ventricular size is normal. 3. The mitral valve is normal in structure. Mild mitral valve regurgitation. No evidence of mitral stenosis. 4. The aortic valve is tricuspid. Aortic valve regurgitation is not visualized. No aortic stenosis is present. 5. The inferior vena cava is normal in size with greater than 50% respiratory variability, suggesting right atrial pressure of 3 mmHg.  FINDINGS Left Ventricle: Left ventricular ejection fraction, by estimation, is 40 to 45%. The left  ventricle has mildly decreased function. The left ventricle demonstrates global hypokinesis. The left ventricular internal cavity size was normal in size. There is no left ventricular hypertrophy. Left ventricular diastolic parameters are consistent with Grade I diastolic dysfunction (impaired relaxation). Normal left ventricular filling pressure.  Right Ventricle: The right ventricular size is normal. No increase in right ventricular wall thickness. Right ventricular systolic function is normal.  Left Atrium: Left atrial size was normal in size.  Right Atrium: Right atrial size was normal in size.  Pericardium: There is no evidence of pericardial effusion.  Mitral Valve: The mitral valve is normal in structure. Mild mitral valve regurgitation. No evidence of mitral valve stenosis.  Tricuspid Valve: The tricuspid valve is normal in structure. Tricuspid valve regurgitation is not demonstrated. No evidence of tricuspid stenosis.  Aortic Valve: The aortic valve is tricuspid. Aortic valve regurgitation is not visualized. No aortic stenosis is present.  Pulmonic Valve: The pulmonic valve was normal in structure. Pulmonic valve regurgitation is not visualized. No evidence of pulmonic stenosis.  Aorta: The aortic root and ascending aorta are structurally normal,  with no evidence of dilitation and the ascending aorta was not well visualized.  Venous: The pulmonary veins were not well visualized. The inferior vena cava is normal in size with greater than 50% respiratory variability, suggesting right atrial pressure of 3 mmHg.  IAS/Shunts: No atrial level shunt detected by color flow Doppler.   LEFT VENTRICLE PLAX 2D LVIDd:         5.61 cm  Diastology LVIDs:         4.50 cm  LV e' medial:    6.53 cm/s LV PW:         0.82 cm  LV E/e' medial:  10.0 LV IVS:        1.05 cm  LV e' lateral:   9.46 cm/s LVOT diam:     2.00 cm  LV E/e' lateral: 6.9 LV SV:         44 LV SV Index:   23 LVOT Area:      3.14 cm  3D Volume EF: 3D EF:        50 % LV EDV:       228 ml LV ESV:       114 ml LV SV:        114 ml  RIGHT VENTRICLE RV S prime:     10.70 cm/s TAPSE (M-mode): 1.8 cm  LEFT ATRIUM             Index       RIGHT ATRIUM           Index LA diam:        3.40 cm 1.76 cm/m  RA Area:     11.90 cm LA Vol (A2C):   35.5 ml 18.32 ml/m RA Volume:   26.10 ml  13.47 ml/m LA Vol (A4C):   28.2 ml 14.56 ml/m LA Biplane Vol: 32.9 ml 16.98 ml/m AORTIC VALVE LVOT Vmax:   59.40 cm/s LVOT Vmean:  45.300 cm/s LVOT VTI:    0.141 m  AORTA Ao Root diam: 2.70 cm Ao Asc diam:  2.90 cm  MITRAL VALVE MV Area (PHT): 3.34 cm    SHUNTS MV Decel Time: 227 msec    Systemic VTI:  0.14 m MV E velocity: 65.60 cm/s  Systemic Diam: 2.00 cm MV A velocity: 71.60 cm/s MV E/A ratio:  0.92  Norman Herrlich MD Electronically signed by Norman Herrlich MD Signature Date/Time: 11/11/2020/12:35:11 PM    Final          ______________________________________________________________________________________________      Risk Assessment/Calculations:             Physical Exam:   VS:  BP 90/60   Pulse 65   Ht 5\' 5"  (1.651 m)   Wt 164 lb (74.4 kg)   SpO2 94%   BMI 27.29 kg/m    Wt Readings from Last 3 Encounters:  12/14/23 164 lb (74.4 kg)  11/30/23 169 lb (76.7 kg)  11/16/23 172 lb 6.4 oz (78.2 kg)    GEN: Well nourished, well developed in no acute distress NECK: No JVD; No carotid bruits CARDIAC: RRR, no murmurs, rubs, gallops RESPIRATORY:  Clear to auscultation without rales, wheezing or rhonchi  ABDOMEN: Soft, non-tender, non-distended EXTREMITIES:  No edema; No deformity   ASSESSMENT AND PLAN: .    Chronic combined systolic and diastolic heart failure: Last EF 40 to 45% in March 2022.  He was off of medication for some time however has since restarted in January.  Blood pressure it was  initially borderline low, however repeat blood pressure was low normal.  I reduced amlodipine to 2.5 mg  daily.  Patient was asymptomatic.  Coronary Artery Disease (CAD) CAD with previous myocardial infarction, currently asymptomatic. Maintained on aspirin, Zetia, and carvedilol. - Continue aspirin, Zetia, and carvedilol.  Hypertension Blood pressure at 104/68 mmHg, borderline hypotensive. Amlodipine dose adjustment due to hypotension; potential discontinuation if hypotension persists. - Reduce amlodipine to 2.5 mg by halving the pill. - Continue aspirin and carvedilol. - Monitor blood pressure; discontinue amlodipine if hypotension persists.  Hyperlipidemia: On rosuvastatin.        Dispo: Follow-up in 4-6 months  Signed, Azalee Course, [place]

## 2024-01-03 DIAGNOSIS — G4733 Obstructive sleep apnea (adult) (pediatric): Secondary | ICD-10-CM | POA: Diagnosis not present

## 2024-02-02 DIAGNOSIS — G4733 Obstructive sleep apnea (adult) (pediatric): Secondary | ICD-10-CM | POA: Diagnosis not present

## 2024-02-08 ENCOUNTER — Emergency Department (HOSPITAL_COMMUNITY)
Admission: EM | Admit: 2024-02-08 | Discharge: 2024-02-08 | Disposition: A | Discharge status: Home / Self Care | Attending: Emergency Medicine | Admitting: Emergency Medicine

## 2024-02-08 ENCOUNTER — Emergency Department (HOSPITAL_COMMUNITY)

## 2024-02-08 ENCOUNTER — Other Ambulatory Visit: Payer: Self-pay

## 2024-02-08 ENCOUNTER — Encounter (HOSPITAL_COMMUNITY): Payer: Self-pay

## 2024-02-08 DIAGNOSIS — Z7982 Long term (current) use of aspirin: Secondary | ICD-10-CM | POA: Insufficient documentation

## 2024-02-08 DIAGNOSIS — R052 Subacute cough: Secondary | ICD-10-CM | POA: Insufficient documentation

## 2024-02-08 DIAGNOSIS — R058 Other specified cough: Secondary | ICD-10-CM | POA: Diagnosis present

## 2024-02-08 DIAGNOSIS — R0602 Shortness of breath: Secondary | ICD-10-CM | POA: Diagnosis not present

## 2024-02-08 DIAGNOSIS — R079 Chest pain, unspecified: Secondary | ICD-10-CM | POA: Insufficient documentation

## 2024-02-08 DIAGNOSIS — Z79899 Other long term (current) drug therapy: Secondary | ICD-10-CM | POA: Insufficient documentation

## 2024-02-08 DIAGNOSIS — I10 Essential (primary) hypertension: Secondary | ICD-10-CM | POA: Diagnosis not present

## 2024-02-08 DIAGNOSIS — R0789 Other chest pain: Secondary | ICD-10-CM | POA: Diagnosis not present

## 2024-02-08 LAB — CBC
HCT: 52.9 % — ABNORMAL HIGH (ref 39.0–52.0)
Hemoglobin: 17.9 g/dL — ABNORMAL HIGH (ref 13.0–17.0)
MCH: 31.6 pg (ref 26.0–34.0)
MCHC: 33.8 g/dL (ref 30.0–36.0)
MCV: 93.3 fL (ref 80.0–100.0)
Platelets: 192 10*3/uL (ref 150–400)
RBC: 5.67 MIL/uL (ref 4.22–5.81)
RDW: 14.5 % (ref 11.5–15.5)
WBC: 7.3 10*3/uL (ref 4.0–10.5)
nRBC: 0 % (ref 0.0–0.2)

## 2024-02-08 LAB — BASIC METABOLIC PANEL WITH GFR
Anion gap: 12 (ref 5–15)
BUN: 10 mg/dL (ref 8–23)
CO2: 19 mmol/L — ABNORMAL LOW (ref 22–32)
Calcium: 9 mg/dL (ref 8.9–10.3)
Chloride: 108 mmol/L (ref 98–111)
Creatinine, Ser: 0.95 mg/dL (ref 0.61–1.24)
GFR, Estimated: >60 mL/min (ref >60)
Glucose, Bld: 128 mg/dL — ABNORMAL HIGH (ref 70–99)
Potassium: 4.4 mmol/L (ref 3.5–5.1)
Sodium: 139 mmol/L (ref 135–145)

## 2024-02-08 LAB — TROPONIN I (HIGH SENSITIVITY)
Troponin I (High Sensitivity): 4 ng/L (ref <18)
Troponin I (High Sensitivity): 4 ng/L (ref <18)

## 2024-02-08 LAB — RESP PANEL BY RT-PCR (RSV, FLU A&B, COVID)  RVPGX2
Influenza A by PCR: NEGATIVE
Influenza B by PCR: NEGATIVE
Resp Syncytial Virus by PCR: NEGATIVE
SARS Coronavirus 2 by RT PCR: NEGATIVE

## 2024-02-08 LAB — BRAIN NATRIURETIC PEPTIDE: B Natriuretic Peptide: 15.6 pg/mL (ref 0.0–100.0)

## 2024-02-08 MED ORDER — BENZONATATE 100 MG PO CAPS: 100.0000 mg | ORAL_CAPSULE | Freq: Three times a day (TID) | ORAL | 0 refills | Status: DC

## 2024-02-08 MED ORDER — HYDROCOD POLI-CHLORPHE POLI ER 10-8 MG/5ML PO SUER
5.0000 mL | Freq: Once | ORAL | Status: AC
  Administered 2024-02-08: 5 mL via ORAL
  Filled 2024-02-08: qty 5

## 2024-02-08 NOTE — ED Triage Notes (Addendum)
 Pt to ED c/o cough x weeks, pt also reports chest pain associated with cough, reports SHOB with exertion o2 sat 89%RA triage. Reports cardiac history

## 2024-02-08 NOTE — Discharge Instructions (Signed)
 I have written you prescription for your cough.  Please take as prescribed.  Please them keep out of reach of small children.  I would like for you to follow-up with your primary care doctor for further evaluation.  You may return to the emergency department for any worsening symptoms.

## 2024-02-08 NOTE — ED Notes (Signed)
 O2 92% while ambulating

## 2024-02-08 NOTE — ED Provider Notes (Addendum)
 [place] EMERGENCY DEPARTMENT AT [facility] Provider Note   CSN: [id number] Arrival date & time: 02/08/24  1112     History Chief Complaint  Patient presents with   Chest Pain   Shortness of Breath   Cough    Lucas Stark is a 68 y.o. male patient with history of hypertension, hyperlipidemia, cardiomyopathy who presents to the emergency department today for further evaluation of cough it has been ongoing for 2 weeks.  Patient has been not taking anything for his cough.  He states that the cough is now causing him some shortness of breath and chest pain.  He denies any fever, chills.  Cough is intermittently productive.  No nausea, vomiting, diarrhea.   Chest Pain Associated symptoms: cough and shortness of breath   Shortness of Breath Associated symptoms: chest pain and cough   Cough Associated symptoms: chest pain and shortness of breath        Home Medications Prior to Admission medications   Medication Sig Start Date End Date Taking? Authorizing Provider  benzonatate  (TESSALON ) 100 MG capsule Take 1 capsule (100 mg total) by mouth every 8 (eight) hours. 02/08/24  Yes Jamal Mays, Parys Elenbaas M, PA-C  amLODipine  (NORVASC ) 2.5 MG tablet Take 1 tablet (2.5 mg total) by mouth daily. 12/14/23 12/13/24  Meng, Hao, PA  aspirin  EC 81 MG tablet Take 81 mg by mouth daily. Swallow whole.    [provider]  carvedilol  (COREG ) 3.125 MG tablet Take 1 tablet (3.125 mg total) by mouth 2 (two) times daily. 10/12/23 01/10/24  Debria Fang, PA-C  empagliflozin  (JARDIANCE ) 10 MG TABS tablet Take 1 tablet (10 mg total) by mouth daily before breakfast. 10/16/23 10/15/24  Jonelle Neri, DO  ezetimibe  (ZETIA ) 10 MG tablet Take 1 tablet (10 mg total) by mouth daily. 10/12/23   Debria Fang, PA-C  losartan  (COZAAR ) 100 MG tablet Take 1 tablet (100 mg total) by mouth daily. 07/04/23   Arellano Zameza, Priscila, MD  nicotine  polacrilex (NICORETTE ) 4 MG gum Take 1 each (4 mg total) by  mouth as needed for smoking cessation. 11/16/23   Dorthy Gavia, MD  nitroGLYCERIN  (NITROSTAT ) 0.4 MG SL tablet Place 1 tablet (0.4 mg total) under the tongue every 5 (five) minutes as needed for chest pain. 10/12/23   Debria Fang, PA-C  rosuvastatin  (CRESTOR ) 40 MG tablet Take 1 tablet (40 mg total) by mouth daily. 10/12/23   Debria Fang, PA-C      Allergies    Advil  [ibuprofen ], Other, and Oxycodone     Review of Systems   Review of Systems  Respiratory:  Positive for cough and shortness of breath.   Cardiovascular:  Positive for chest pain.  All other systems reviewed and are negative.   Physical Exam Updated Vital Signs BP (!) 135/90   Pulse 90   Temp 98.4 F (36.9 C) (Oral)   Resp 14   Ht 5\' 6"  (1.676 m)   Wt 77.1 kg   SpO2 92%   BMI 27.43 kg/m  Physical Exam Vitals and nursing note reviewed.  Constitutional:      General: He is not in acute distress.    Appearance: Normal appearance.  HENT:     Head: Normocephalic and atraumatic.  Eyes:     General:        Right eye: No discharge.        Left eye: No discharge.  Cardiovascular:     Comments: Regular rate and rhythm.  S1/S2 are  distinct without any evidence of murmur, rubs, or gallops.  Radial pulses are 2+ bilaterally.  Dorsalis pedis pulses are 2+ bilaterally.  No evidence of pedal edema. Pulmonary:     Comments: Clear to auscultation bilaterally.  Normal effort.  No respiratory distress.  No evidence of wheezes, rales, or rhonchi heard throughout. Abdominal:     General: Abdomen is flat. Bowel sounds are normal. There is no distension.     Tenderness: There is no abdominal tenderness. There is no guarding or rebound.  Musculoskeletal:        General: Normal range of motion.     Cervical back: Neck supple.  Skin:    General: Skin is warm and dry.     Findings: No rash.  Neurological:     General: No focal deficit present.     Mental Status: He is alert.  Psychiatric:        Mood and Affect:  Mood normal.        Behavior: Behavior normal.     ED Results / Procedures / Treatments   Labs (all labs ordered are listed, but only abnormal results are displayed) Labs Reviewed  BASIC METABOLIC PANEL WITH GFR - Abnormal; Notable for the following components:      Result Value   CO2 19 (*)    Glucose, Bld 128 (*)    All other components within normal limits  CBC - Abnormal; Notable for the following components:   Hemoglobin 17.9 (*)    HCT 52.9 (*)    All other components within normal limits  RESP PANEL BY RT-PCR (RSV, FLU A&B, COVID)  RVPGX2  BRAIN NATRIURETIC PEPTIDE  TROPONIN I (HIGH SENSITIVITY)  TROPONIN I (HIGH SENSITIVITY)    EKG EKG Interpretation Date/Time:  Thursday Feb 08 2024 11:18:42 EDT Ventricular Rate:  107 PR Interval:  134 QRS Duration:  88 QT Interval:  334 QTC Calculation: 445 R Axis:   48  Text Interpretation: Sinus tachycardia Cannot rule out Inferior infarct , age undetermined Cannot rule out Anterior infarct , age undetermined PREVIOUS ECG IS PRESENT ST changes throughout but no new STEs Confirmed by Annita Kindle [phone]) on 02/08/2024 6:24:57 PM  Radiology DG Chest 2 View Result Date: 02/08/2024 CLINICAL DATA:  Chest pain EXAM: CHEST - 2 VIEW COMPARISON:  X-ray 09/30/2020. FINDINGS: No consolidation, pneumothorax or effusion. No edema. Normal cardiopericardial silhouette. Degenerative changes are seen of the spine on lateral view. IMPRESSION: No acute cardiopulmonary disease. Electronically Signed   By: Adrianna Horde M.D.   On: 02/08/2024 12:25    Procedures Procedures    Medications Ordered in ED Medications  chlorpheniramine-HYDROcodone  (TUSSIONEX) 10-8 MG/5ML suspension 5 mL (5 mLs Oral Given 02/08/24 1829)    ED Course/ Medical Decision Making/ A&P Clinical Course as of 02/08/24 2006  Thu Feb 08, 2024  2003 CBC(!) Normal. [CF]  2003 Basic metabolic panel(!) Normal. [CF]  2003 BNP (Order if Patient has history of Heart  Failure) Negative. [CF]  2003 Resp panel by RT-PCR (RSV, Flu A&B, Covid) Anterior Nasal Swab Negative. [CF]  2003 Troponin I (High Sensitivity) Initial delta troponin are normal. [CF]    Clinical Course User Index [CF] Darletta Ehrich, PA-C   {   Click here for ABCD2, HEART and other calculators  Medical Decision Making GERRY BLANCHFIELD is a 68 y.o. male patient who presents to the emergency department today for further evaluation of cough.  Labs were obtained in triage interpreted by myself.  No evidence of leukocytosis.  Troponins were negative x 2.  Chest x-ray was clear.  No evidence of pneumonia or pneumothorax.  I favor postviral cough.  Labs and imaging are all reassuring. We ambulated the patient with pulse ox and he was 92% on room air.  Will send him home with Tessalon  Perles.  Strict return precautions were discussed.  He is safe for discharge.  Amount and/or Complexity of Data Reviewed Labs: ordered. Decision-making details documented in ED Course. Radiology: ordered.  Risk Prescription drug management.    Final Clinical Impression(s) / ED Diagnoses Final diagnoses:  Subacute cough    Rx / DC Orders ED Discharge Orders          Ordered    benzonatate  (TESSALON ) 100 MG capsule  Every 8 hours        02/08/24 2002              Angelyn Kennel [place], PA-C 02/08/24 [address] [place], PA-C 02/08/24 2006    Merdis Stalling, MD 02/14/24 [phone]

## 2024-02-08 NOTE — ED Notes (Signed)
 Patient ambulated with SpO2 monitoring. Patient O2 saturation remained >92% throughout.

## 2024-02-09 ENCOUNTER — Telehealth: Payer: Self-pay | Admitting: Family Medicine

## 2024-02-09 NOTE — Telephone Encounter (Signed)
 Can you please contact patient and make sure he was able to find a mask that doesn't irritate his gums. If he needs any assistance please let me know.

## 2024-02-12 ENCOUNTER — Ambulatory Visit: Payer: Self-pay

## 2024-02-12 NOTE — Telephone Encounter (Signed)
 Called pt and he stated that he doesn't have is machine anymore, that he was told to box up everything and mail his CPAP back. Pt states that he went to the hospital on last Thursday having trouble breathing and coughing. Pt had to be put on oxygen due to his breathing issues. Pt states that he would like to get back on his CPAP Machine and would like the mask that goes under your nose like the oxygen he was put on at the hospital.

## 2024-02-12 NOTE — Telephone Encounter (Signed)
 Copied from CRM [id number]. Topic: Clinical - Red Word Triage >> Feb 12, 2024  8:09 AM Danelle Dunning F wrote: Red Word that prompted transfer to Nurse Triage:   Back pain; subacute cough; SOB; Seen in ED on 02/08/2024 for cough; increased congestion; wheezing  When the patient lays on his back he is getting a sharp pain that shoots from one side to the other; was previously treated for a cough on 5/29 at the ED. Patient would like to be seen for his back concern; Patient also stated he has moments when he has shortness of breath  Chief Complaint: back pain, cough, wheezing, seen in ER on 5/29 Symptoms: see above Frequency: comes and goes Pertinent Negatives: Patient denies fever, cp, Disposition: [] ED /[] Urgent Care (no appt availability in office) / [x] Appointment(In office/virtual)/ []  [facility]/ [] Home Care/ [] Refused Recommended Disposition /[]  Mobile Bus/ []  Follow-up with PCP Additional Notes: apt scheduled per protocol; care advice given, denies questions; instructed to go to ER if becomes worse.   Reason for Disposition  [1] MODERATE back pain (e.g., interferes with normal activities) AND [2] present > 3 days  Answer Assessment - Initial Assessment Questions 1. ONSET: "When did the pain begin?"      "Sharp pain" 2. LOCATION: "Where does it hurt?" (upper, mid or lower back)     Left sid 3. SEVERITY: "How bad is the pain?"  (e.g., Scale 1-10; mild, moderate, or severe)   - MILD (1-3): Doesn't interfere with normal activities.    - MODERATE (4-7): Interferes with normal activities or awakens from sleep.    - SEVERE (8-10): Excruciating pain, unable to do any normal activities.      mild 4. PATTERN: "Is the pain constant?" (e.g., yes, no; constant, intermittent)      Comes and goes 5. RADIATION: "Does the pain shoot into your legs or somewhere else?"     Moves across side of back 6. CAUSE:  "What do you think is causing the back pain?"      no 7. BACK OVERUSE:   "Any recent lifting of heavy objects, strenuous work or exercise?"     no 8. MEDICINES: "What have you taken so far for the pain?" (e.g., nothing, acetaminophen , NSAIDS)     Was seen in the ER on 02/08/24.  States has not taken any meds. 9. NEUROLOGIC SYMPTOMS: "Do you have any weakness, numbness, or problems with bowel/bladder control?"     no 10. OTHER SYMPTOMS: "Do you have any other symptoms?" (e.g., fever, abdomen pain, burning with urination, blood in urine)       no 11. PREGNANCY: "Is there any chance you are pregnant?" "When was your last menstrual period?"       na  Protocols used: Back Pain-A-AH

## 2024-02-12 NOTE — Telephone Encounter (Signed)
 Pt has an appt 6/4 with Dr Esaw Heckler.

## 2024-02-14 ENCOUNTER — Ambulatory Visit (INDEPENDENT_AMBULATORY_CARE_PROVIDER_SITE_OTHER): Admitting: Internal Medicine

## 2024-02-14 ENCOUNTER — Encounter: Payer: Self-pay | Admitting: Internal Medicine

## 2024-02-14 VITALS — BP 121/80 | HR 82 | Temp 98.4°F | Ht 66.0 in | Wt 161.0 lb

## 2024-02-14 DIAGNOSIS — J441 Chronic obstructive pulmonary disease with (acute) exacerbation: Secondary | ICD-10-CM

## 2024-02-14 MED ORDER — INCRUSE ELLIPTA 62.5 MCG/ACT IN AEPB: 1.0000 | INHALATION_SPRAY | Freq: Every day | RESPIRATORY_TRACT | 2 refills | Status: DC

## 2024-02-14 MED ORDER — ALBUTEROL SULFATE HFA 108 (90 BASE) MCG/ACT IN AERS: 2.0000 | INHALATION_SPRAY | Freq: Four times a day (QID) | RESPIRATORY_TRACT | 2 refills | Status: AC | PRN

## 2024-02-14 MED ORDER — PREDNISONE 20 MG PO TABS: 40.0000 mg | ORAL_TABLET | Freq: Every day | ORAL | 0 refills | Status: DC

## 2024-02-14 NOTE — Patient Instructions (Addendum)
 Mr Rehman,   I believe your shortness of breath is because of a COPD exacerbation. That is when your lungs become inflamed and make it hard for you to breathe.  To treat this, I am ordering a medication called prednisone .  You will take 40 mg (2 pills) for 5 days.  I will call you later this week to see how your breathing is doing.  I am also reordering your Incruse Ellipta , which is a daily inhaler that will prevent COPD exacerbations from occurring in the future.  I am also ordering an albuterol  inhaler, which you can use as needed when you are feeling short of breath or wheezy.  Thanks, Dr. Esaw Heckler

## 2024-02-14 NOTE — Assessment & Plan Note (Signed)
" >>  ASSESSMENT AND PLAN FOR COPD EXACERBATION (HCC) WRITTEN ON 02/14/2024  9:26 AM BY FRANCELLA ROGUE, MD  Patient reports approximately a week and a half of shortness of breath, dyspnea on exertion, mild wheezing, and cough that is sometimes productive.  He was seen in the ED last week for same symptoms, at the time they gave Tessalon  Perles for postviral cough.  Patient's symptoms have persisted since then.  On exam patient is mildly fatigued and ill-appearing, poor air movement bilaterally with minimal wheezing, no lower extremity edema.  Patient likely has COPD exacerbation.  He has been on Incruse Ellipta  in the past, but stopped refilling this medication several months ago. - Treat for COPD exacerbation with 5-day course of prednisone  40 mg - Restart daily Incruse Ellipta  and as needed albuterol  inhaler - Will call in the next several days to evaluate symptoms "

## 2024-02-14 NOTE — Assessment & Plan Note (Signed)
 Patient reports approximately a week and a half of shortness of breath, dyspnea on exertion, mild wheezing, and cough that is sometimes productive.  He was seen in the ED last week for same symptoms, at the time they gave Tessalon  Perles for postviral cough.  Patient's symptoms have persisted since then.  On exam patient is mildly fatigued and ill-appearing, poor air movement bilaterally with minimal wheezing, no lower extremity edema.  Patient likely has COPD exacerbation.  He has been on Incruse Ellipta  in the past, but stopped refilling this medication several months ago. - Treat for COPD exacerbation with 5-day course of prednisone  40 mg - Restart daily Incruse Ellipta  and as needed albuterol  inhaler - Will call in the next several days to evaluate symptoms

## 2024-02-14 NOTE — Progress Notes (Signed)
 Subjective:  CC: shortness of breath  HPI:  Lucas Stark is a 68 y.o. male with a past medical history stated below and presents today for above. Please see problem based assessment and plan for additional details.  Past Medical History:  Diagnosis Date   Alcohol abuse 03/12/2016   Appendicitis 04/29/2021   CAD (coronary artery disease) cardiologist-- dr Audery Blazing   a. 03/2016 NSTEMI/PCI: LM nl, LAD nl, RI nl, LCX nl, OM2 99 ( 2.75 x 16 Synergy DES), RCA nl, EF 45-50%.   ETOH abuse    GERD (gastroesophageal reflux disease)    History of cocaine abuse (HCC)    per pt none since 2017   History of non-ST elevation myocardial infarction (NSTEMI) 03/14/2016   s/p  PCI and DES x1   Hyperlipidemia    Hypertension    Hypertensive heart disease    Ischemic cardiomyopathy    a. 03/2016 Echo: EF 35-40%, diff H, sev inflat HK, Gr2 DD;  b. 03/2016 EF 45-50% by LV gram.;  echo 10-24-2018, ef 45-50%   Knee effusion, left 08/15/2018   Myocardial infarction [facility]) 2017   NSTEMI    OA (osteoarthritis)    left knee   Obstructive sleep apnea 06/01/2023   S/P drug eluting coronary stent placement 03/14/2016   DES x1 to OM1   Tobacco abuse    Wears dentures    upper   Wears partial dentures    lower    Current Outpatient Medications on File Prior to Visit  Medication Sig Dispense Refill   amLODipine  (NORVASC ) 2.5 MG tablet Take 1 tablet (2.5 mg total) by mouth daily.     aspirin  EC 81 MG tablet Take 81 mg by mouth daily. Swallow whole.     benzonatate  (TESSALON ) 100 MG capsule Take 1 capsule (100 mg total) by mouth every 8 (eight) hours. 21 capsule 0   carvedilol  (COREG ) 3.125 MG tablet Take 1 tablet (3.125 mg total) by mouth 2 (two) times daily. 180 tablet 3   empagliflozin  (JARDIANCE ) 10 MG TABS tablet Take 1 tablet (10 mg total) by mouth daily before breakfast. 30 tablet 11   ezetimibe  (ZETIA ) 10 MG tablet Take 1 tablet (10 mg total) by mouth daily. 90 tablet 3   losartan  (COZAAR )  100 MG tablet Take 1 tablet (100 mg total) by mouth daily. 90 tablet 3   nicotine  polacrilex (NICORETTE ) 4 MG gum Take 1 each (4 mg total) by mouth as needed for smoking cessation. 100 tablet 0   nitroGLYCERIN  (NITROSTAT ) 0.4 MG SL tablet Place 1 tablet (0.4 mg total) under the tongue every 5 (five) minutes as needed for chest pain. 25 tablet 3   rosuvastatin  (CRESTOR ) 40 MG tablet Take 1 tablet (40 mg total) by mouth daily. 90 tablet 3   No current facility-administered medications on file prior to visit.   Review of Systems: ROS negative except for as is noted on the assessment and plan.  Objective:   Vitals:   02/14/24 0842  BP: 121/80  Pulse: 82  Temp: 98.4 F (36.9 C)  TempSrc: Oral  SpO2: (!) 88%  Weight: 161 lb (73 kg)  Height: 5\' 6"  (1.676 m)   Physical Exam: Constitutional: fatigued, mildly ill appearing Cardiovascular: regular rate and rhythm Pulmonary/Chest: mildly dyspneic. Poor air movement bilaterally, mild wheezes with deep inspiration MSK: normal bulk and tone  Assessment & Plan:   COPD exacerbation (HCC) Patient reports approximately a week and a half of shortness of breath, dyspnea on  exertion, mild wheezing, and cough that is sometimes productive.  He was seen in the ED last week for same symptoms, at the time they gave Tessalon  Perles for postviral cough.  Patient's symptoms have persisted since then.  On exam patient is mildly fatigued and ill-appearing, poor air movement bilaterally with minimal wheezing, no lower extremity edema.  Patient likely has COPD exacerbation.  He has been on Incruse Ellipta  in the past, but stopped refilling this medication several months ago. - Treat for COPD exacerbation with 5-day course of prednisone  40 mg - Restart daily Incruse Ellipta  and as needed albuterol  inhaler - Will call in the next several days to evaluate symptoms    Patient discussed with Dr. Frankie Israel MD [facility] Internal Medicine   PGY-1 Pager: [phone] Date 02/14/2024  Time 9:26 AM

## 2024-02-15 ENCOUNTER — Encounter: Payer: Self-pay | Admitting: *Deleted

## 2024-02-19 NOTE — Progress Notes (Signed)
 Internal Medicine Clinic Attending  Case discussed with the resident at the time of the visit.  We reviewed the resident's history and exam and pertinent patient test results.  I agree with the assessment, diagnosis, and plan of care documented in the resident's note.

## 2024-04-09 NOTE — Progress Notes (Signed)
 YEP:Qnoont-le coronary artery disease. Note h/o substance abuse. Patient previously admitted with non-ST elevation myocardial infarction. Echocardiogram July 2017 showed ejection fraction 35-40% and grade 2 diastolic dysfunction. Cardiac catheterization July 2017 showed mild LV systolic dysfunction, 99% second marginal. Patient had PCI of the second marginal with a drug-eluting stent. Patient had cough with ACEI. Nuclear study February 2019 showed ejection fraction 34%, prior inferior infarct but no ischemia. Most recent echocardiogram March 2022 showed ejection fraction 40 to 45%, grade 1 diastolic dysfunction, mild mitral regurgitation. Since last seen, patient denies dyspnea, chest pain, palpitations or syncope.  Current Outpatient Medications  Medication Sig Dispense Refill   albuterol  (VENTOLIN  HFA) 108 (90 Base) MCG/ACT inhaler Inhale 2 puffs into the lungs every 6 (six) hours as needed for wheezing or shortness of breath. 8.5 g 2   amLODipine  (NORVASC ) 2.5 MG tablet Take 1 tablet (2.5 mg total) by mouth daily.     aspirin  EC 81 MG tablet Take 81 mg by mouth daily. Swallow whole.     benzonatate  (TESSALON ) 100 MG capsule Take 1 capsule (100 mg total) by mouth every 8 (eight) hours. 21 capsule 0   carvedilol  (COREG ) 3.125 MG tablet Take 1 tablet (3.125 mg total) by mouth 2 (two) times daily. 180 tablet 3   empagliflozin  (JARDIANCE ) 10 MG TABS tablet Take 1 tablet (10 mg total) by mouth daily before breakfast. 30 tablet 11   ezetimibe  (ZETIA ) 10 MG tablet Take 1 tablet (10 mg total) by mouth daily. 90 tablet 3   losartan  (COZAAR ) 100 MG tablet Take 1 tablet (100 mg total) by mouth daily. 90 tablet 3   nicotine  polacrilex (NICORETTE ) 4 MG gum Take 1 each (4 mg total) by mouth as needed for smoking cessation. 100 tablet 0   nitroGLYCERIN  (NITROSTAT ) 0.4 MG SL tablet Place 1 tablet (0.4 mg total) under the tongue every 5 (five) minutes as needed for chest pain. 25 tablet 3   predniSONE   (DELTASONE ) 20 MG tablet Take 2 tablets (40 mg total) by mouth daily with breakfast. 10 tablet 0   rosuvastatin  (CRESTOR ) 40 MG tablet Take 1 tablet (40 mg total) by mouth daily. 90 tablet 3   umeclidinium bromide  (INCRUSE ELLIPTA ) 62.5 MCG/ACT AEPB Inhale 1 puff into the lungs daily. 30 each 2   No current facility-administered medications for this visit.     Past Medical History:  Diagnosis Date   Alcohol abuse 03/12/2016   Appendicitis 04/29/2021   CAD (coronary artery disease) cardiologist-- dr pietro   a. 03/2016 NSTEMI/PCI: LM nl, LAD nl, RI nl, LCX nl, OM2 99 ( 2.75 x 16 Synergy DES), RCA nl, EF 45-50%.   ETOH abuse    GERD (gastroesophageal reflux disease)    History of cocaine abuse (HCC)    per pt none since 2017   History of non-ST elevation myocardial infarction (NSTEMI) 03/14/2016   s/p  PCI and DES x1   Hyperlipidemia    Hypertension    Hypertensive heart disease    Ischemic cardiomyopathy    a. 03/2016 Echo: EF 35-40%, diff H, sev inflat HK, Gr2 DD;  b. 03/2016 EF 45-50% by LV gram.;  echo 10-24-2018, ef 45-50%   Knee effusion, left 08/15/2018   Myocardial infarction [facility]) 2017   NSTEMI    OA (osteoarthritis)    left knee   Obstructive sleep apnea 06/01/2023   S/P drug eluting coronary stent placement 03/14/2016   DES x1 to OM1   Tobacco abuse  Wears dentures    upper   Wears partial dentures    lower    Past Surgical History:  Procedure Laterality Date   CARDIAC CATHETERIZATION N/A 03/14/2016   Procedure: Left Heart Cath and Coronary Angiography;  Surgeon: Lonni JONETTA Cash, MD;  Location: [facility] INVASIVE CV LAB;  Service: Cardiovascular;  Laterality: N/A;   CORONARY ANGIOPLASTY WITH STENT PLACEMENT     FACIAL COSMETIC SURGERY     at age 74 yrs old   LACERATION REPAIR  age 69   face around eye   LAPAROSCOPIC APPENDECTOMY N/A 04/29/2021   Procedure: APPENDECTOMY LAPAROSCOPIC;  Surgeon: Rubin Calamity, MD;  Location: [facility] OR;  Service: General;  Laterality:  N/A;   PARTIAL KNEE ARTHROPLASTY Left 11/13/2018   Procedure: UNICOMPARTMENTAL KNEE;  Surgeon: Beverley Evalene JONETTA, MD;  Location: [facility] ORS;  Service: Orthopedics;  Laterality: Left;   WRIST SURGERY Left 01/31/2020    Social History   Socioeconomic History   Marital status: Legally Separated    Spouse name: Not on file   Number of children: 8   Years of education: Not on file   Highest education level: Not on file  Occupational History   Not on file  Tobacco Use   Smoking status: Some Days    Current packs/day: 0.50    Average packs/day: 0.5 packs/day for 35.0 years (17.5 ttl pk-yrs)    Types: Cigarettes   Smokeless tobacco: Never   Tobacco comments:    some days less  6  per day   Vaping Use   Vaping status: Never Used  Substance and Sexual Activity   Alcohol use: Yes    Alcohol/week: 5.0 standard drinks of alcohol    Types: 1 Cans of beer, 4 Shots of liquor per week    Comment: 1/3 pint daily   Drug use: Not Currently    Types: Cocaine    Comment: 11-06-2018 per pt last cocaine when I had my heart attack  2017   Sexual activity: Not Currently  Other Topics Concern   Not on file  Social History Narrative   Caffiene 1/2 cup coffee daily   Retired:  [employer]      Social Drivers of Corporate investment banker Strain: Low Risk  (11/22/2022)   Overall Financial Resource Strain (CARDIA)    Difficulty of Paying Living Expenses: Not very hard  Food Insecurity: No Food Insecurity (10/16/2023)   Hunger Vital Sign    Worried About Running Out of Food in the Last Year: Never true    Ran Out of Food in the Last Year: Never true  Transportation Needs: No Transportation Needs (11/22/2022)   PRAPARE - Administrator, Civil Service (Medical): No    Lack of Transportation (Non-Medical): No  Physical Activity: Insufficiently Active (11/22/2022)   Exercise Vital Sign    Days of Exercise per Week: 1 day    Minutes of Exercise per Session: 10 min  Stress: No Stress  Concern Present (11/22/2022)   [employer] of Occupational Health - Occupational Stress Questionnaire    Feeling of Stress : Only a little  Social Connections: Not on File (05/27/2023)   Received from [facility]   Social Connections    Connectedness: 0  Intimate Partner Violence: Not At Risk (10/16/2023)   Humiliation, Afraid, Rape, and Kick questionnaire    Fear of Current or Ex-Partner: No    Emotionally Abused: No    Physically Abused: No    Sexually Abused: No  Family History  Problem Relation Age of Onset   Heart attack Father    Heart attack Sister    Colon polyps Maternal Uncle    Colon cancer Neg Hx    Esophageal cancer Neg Hx    Stomach cancer Neg Hx    Rectal cancer Neg Hx    Sleep apnea Neg Hx     ROS: no fevers or chills, productive cough, hemoptysis, dysphasia, odynophagia, melena, hematochezia, dysuria, hematuria, rash, seizure activity, orthopnea, PND, pedal edema, claudication. Remaining systems are negative.  Physical Exam: Well-developed well-nourished in no acute distress.  Skin is warm and dry.  HEENT is normal.  Neck is supple.  Chest is clear to auscultation with normal expansion.  Cardiovascular exam is regular rate and rhythm.  Abdominal exam nontender or distended. No masses palpated. Extremities show no edema. neuro grossly intact    A/P  1 coronary artery disease-patient doing well from a symptomatic standpoint.  Continue medical therapy with aspirin  and statin.  2 hypertension-patient's blood pressure is controlled.  Will change losartan  to Entresto  and discontinue amlodipine  given ischemic cardiomyopathy.  3 ischemic cardiomyopathy-discontinue losartan  and amlodipine .  Begin Entresto  24/26 twice daily.  Check potassium and renal function in 1 week.  Follow blood pressure and advance as needed.  Repeat echocardiogram in 3 months.  4 hyperlipidemia-continue Zetia  and statin.  Check lipids and liver.  5 tobacco abuse-patient again counseled  on discontinuing.  Redell Shallow, MD

## 2024-04-23 ENCOUNTER — Ambulatory Visit: Attending: Cardiology | Admitting: Cardiology

## 2024-04-23 ENCOUNTER — Encounter: Payer: Self-pay | Admitting: Cardiology

## 2024-04-23 ENCOUNTER — Telehealth: Payer: Self-pay | Admitting: Cardiology

## 2024-04-23 VITALS — BP 128/79 | HR 62 | Ht 66.0 in | Wt 166.7 lb

## 2024-04-23 DIAGNOSIS — Z720 Tobacco use: Secondary | ICD-10-CM | POA: Diagnosis not present

## 2024-04-23 DIAGNOSIS — I2510 Atherosclerotic heart disease of native coronary artery without angina pectoris: Secondary | ICD-10-CM | POA: Diagnosis not present

## 2024-04-23 DIAGNOSIS — I255 Ischemic cardiomyopathy: Secondary | ICD-10-CM | POA: Diagnosis not present

## 2024-04-23 DIAGNOSIS — I10 Essential (primary) hypertension: Secondary | ICD-10-CM

## 2024-04-23 DIAGNOSIS — E785 Hyperlipidemia, unspecified: Secondary | ICD-10-CM | POA: Diagnosis not present

## 2024-04-23 MED ORDER — SACUBITRIL-VALSARTAN 24-26 MG PO TABS: 1.0000 | ORAL_TABLET | Freq: Two times a day (BID) | ORAL | 3 refills | Status: AC

## 2024-04-23 NOTE — Patient Instructions (Signed)
 Medication Instructions:  Stop: Amlodipine  (Norvasc ) Stop: Losartan  (Cozaar )  Start: Sacubitril -Valsartan  (ENTRESTO ) 24-26 mg, one tablet, two times Daily   Lab Work: To be done in ONE WEEK: FASTING Lipid panel, Liver Function panel and BMET; you can go to any [facility] or back to [address], [place], [place]  Lab is on [address]  Mon-Fri 8:00 am- 4:445 pm  If you have labs (blood work) drawn today and your tests are completely normal, you will receive your results only by: MyChart Message (if you have MyChart) OR A paper copy in the mail If you have any lab test that is abnormal or we need to change your treatment, we will call you to review the results.  Testing/Procedures: Your physician has requested that you have an echocardiogram in about 3 months (due mid November 2025). Echocardiography is a painless test that uses sound waves to create images of your heart. It provides your doctor with information about the size and shape of your heart and how well your heart's chambers and valves are working. This procedure takes approximately one hour. There are no restrictions for this procedure. Please do NOT wear cologne, perfume, aftershave, or lotions (deodorant is allowed). Please arrive 15 minutes prior to your appointment time.  Please note: We ask at that you not bring children with you during ultrasound (echo/ vascular) testing. Due to room size and safety concerns, children are not allowed in the ultrasound rooms during exams. Our front office staff cannot provide observation of children in our lobby area while testing is being conducted. An adult accompanying a patient to their appointment will only be allowed in the ultrasound room at the discretion of the ultrasound technician under special circumstances. We apologize for any inconvenience.   Follow-Up: At [facility], you and your health needs are our priority.  As part of our continuing mission to provide you with  exceptional heart care, our providers are all part of one team.  This team includes your primary Cardiologist (physician) and Advanced Practice Providers or APPs (Physician Assistants and Nurse Practitioners) who all work together to provide you with the care you need, when you need it.  Your next appointment:   1 year(s)  Provider:   Redell Shallow, MD   We recommend signing up for the patient portal called MyChart.  Sign up information is provided on this After Visit Summary.  MyChart is used to connect with patients for Virtual Visits (Telemedicine).  Patients are able to view lab/test results, encounter notes, upcoming appointments, etc.  Non-urgent messages can be sent to your provider as well.   To learn more about what you can do with MyChart, go to [email or web address].   Other Instructions Please call us  or send a MyChart message with any Cardiology related questions/concerns.  [phone].  Thank you!

## 2024-04-23 NOTE — Telephone Encounter (Signed)
 Pt c/o medication issue:  1. Name of Medication:   empagliflozin  (JARDIANCE ) 10 MG TABS tablet   2. How are you currently taking this medication (dosage and times per day)?     3. Are you having a reaction (difficulty breathing--STAT)?   4. What is your medication issue?   Patient wants a call back to confirm if he should still be taking this medication.

## 2024-04-23 NOTE — Telephone Encounter (Signed)
 Spoke with pt regarding his questions about his medications. Pt wanted to ensure he is supposed to be taking Jardiance  and is supposed to stop Losartan  and Amlodipine . Pt was notified that per Dr. Vertie note, the pt is to continue taking Jardiance  and stop Losartan  and Amlodipine . Pt verbalized understanding. All questions if any were answered.

## 2024-05-01 NOTE — Patient Instructions (Incomplete)
 Untreated obstructive sleep apnea when it is moderate to severe can have an adverse impact on cardiovascular health and raise her risk for heart disease, arrhythmias, hypertension, congestive heart failure, stroke and diabetes. Untreated obstructive sleep apnea causes sleep disruption, nonrestorative sleep, and sleep deprivation. This can have an impact on your day to day functioning and cause daytime sleepiness and impairment of cognitive function, memory loss, mood disturbance, and problems focussing. Using CPAP regularly can improve these symptoms.  I am going to refer you to a dentist that can discuss options for an oral appliance to manage sleep apnea. If they get you set up with an oral appliance let me know.   Follow up in 6 months

## 2024-05-01 NOTE — Progress Notes (Unsigned)
 SABRA

## 2024-05-01 NOTE — Progress Notes (Unsigned)
 PATIENT: Lucas Stark DOB: September 28, 1955  REASON FOR VISIT: follow up HISTORY FROM: patient  No chief complaint on file.    HISTORY OF PRESENT ILLNESS:  05/01/24 ALL:  Lucas Stark returns for follow up for OSA on CPAP. He was last seen 11/2023 and having difficulty adjusting to therapy. Since,     11/30/2023 ALL:  Lucas Stark is a 68 y.o. male here today for follow up for OSA on CPAP.  He was diagnosed with OSA by Dr Buck 11/2018 and advised to returns for titration study. He was seen in follow up with Dr Buck 11/2019 and discussed repeating HST. He was lost to follow up and seen again by Dr Buck 06/2023. HST performed 08/2023 and showed moderate obstructive sleep apnea with a total AHI of 16.2/hour and O2 nadir of 82%. Intermittent mild to moderate snoring was detected. AutoPAP advised.   Since, he reports difficulty adjusting to therapy. He reports that the mask tends to leak unless he puts it on tightly. When the mask is tightened, the pressure irritates his lower gums where during the day he wears a partial denture for his lower teeth. He has only tried one mask (though he has three of the same type of FFM). The patient does not report any other issues with the device and has not noticed a significant difference in how he feels when using the machine. He was unclear about when to use the machine as well as the machines purpose.       HISTORY: (copied from Dr Obie previous note)  Dear Dr. Gregary,    I saw your patient, Lucas Stark, upon your kind request in my sleep clinic today for evaluation of his prior diagnosis of obstructive sleep apnea.  The patient is unaccompanied today.  As you know, [name] is a 68 year old male with an underlying complex medical history of coronary artery disease with status post MI, ischemic cardiomyopathy, status post stent placement, arthritis, status post left partial knee replacement, alcohol use disorder, history of substance use disorder,  hypertension, hyperlipidemia, smoking, and overweight state, who was previously diagnosed with obstructive sleep apnea.  He has not been on PAP therapy.  I had evaluated him over 3 years ago.  He does not report any significant sleep-related symptoms.  He is in fact not quite sure why he is here today.   His Epworth sleepiness score is 24, fatigue severity score is 9 out of 63.  I reviewed your office note from 06/01/2023.     His baseline polysomnogram from 11/11/2018 showed an AHI of 8/h, O2 nadir 83%.  He has had a little bit of weight loss since then.  Compared to 2020 he is about 7 pounds lighter.   He is not sure if he snores.  Bedtime varies anywhere between 4 and 7 PM.  He has a TV on in his bedroom and tends to stay on all night.  He lives with his significant other.  Rise time is around 7 AM.  He drinks caffeine in limitation, about half a cup of coffee per day, occasional tea, no soda daily.  He smokes half a pack per day.  He drinks alcohol daily and denies a history of alcohol use disorder.  He drinks about 2 servings of liquor, 8 ounce size each per day.  He has not used any illicit drugs in 5 or 6 years he states.     Previously:   11/27/2019: Lucas Stark is a 68 year old right-handed  gentleman with an underlying medical history of hypertension, smoking, coronary artery disease with history of non-STEMI in 2017 and history of stent placement, ischemic cardiomyopathy, history of substance abuse (per chart review) and overweight state, who presents for reevaluation of his obstructive sleep apnea.  The patient is unaccompanied today.  I first met him on 10/18/2018 at the request of his primary care physician, at which time he reported sleepiness and sleep paralysis.  He had a baseline sleep study on 11/11/2018 which showed a reduced sleep efficiency at 40.9%, sleep latency was markedly delayed, REM latency 48 minutes, REM percentage was normal at 19.3%.  Total AHI was 8/h, REM AHI was 18.5/h, O2 nadir  was 83%.  He was advised to return for a CPAP titration study to treat his mild to moderate obstructive sleep apnea.  He did not return for a second study and was lost to follow-up.  He presents for reevaluation.   11/27/2019: he reports no new symptoms, continues to have sleep paralysis episodes when he sleeps during the day. He sleeps without a set schedule, and reports there is nothing to do.  His Epworth sleepiness score is 10 out of 24.  He may be in bed by 2 PM, may sleep all day, girlfriend works second shift.  He denies any symptoms of depression.  He does not work.  He has nocturia about once or twice per average night.   10/18/18: (He) reports difficulty waking up, sleep paralysis, and excessive daytime somnolence. I reviewed your office note from 07/27/2018, which you kindly included.  Lives with GF, has been noted to snoring, he feels like he has to wake himself up. GF works 2nd shift.  His BT varies, no set time, rise time usually 5:30 or 6. No night to night nocturia, no AM HAs. He sees cardiology, has appt tomorrow, has had CP on the right recently, feels muscular to him, different from when he had MI. He has L partial knee replacement planned in March. No FHx of OSA. He smokes one pack per day, drinks alcohol on the weekend, denies any illicit drug use and denies caffeine use. He is not aware of any family history of OSA.   REVIEW OF SYSTEMS: Out of a complete 14 system review of symptoms, the patient complains only of the following symptoms, sore gums, and all other reviewed systems are negative.   ALLERGIES: Allergies  Allergen Reactions   Advil  [Ibuprofen ]      I cant take, I had a stent put in, I have to take tylenol    Other Itching    Prescription Sleeping pills, Unsure of the name of the sleeping pill   Oxycodone      Nervousness, sweaty, itchy    HOME MEDICATIONS: Outpatient Medications Prior to Visit  Medication Sig Dispense Refill   albuterol  (VENTOLIN  HFA) 108 (90  Base) MCG/ACT inhaler Inhale 2 puffs into the lungs every 6 (six) hours as needed for wheezing or shortness of breath. 8.5 g 2   aspirin  EC 81 MG tablet Take 81 mg by mouth daily. Swallow whole.     benzonatate  (TESSALON ) 100 MG capsule Take 1 capsule (100 mg total) by mouth every 8 (eight) hours. 21 capsule 0   carvedilol  (COREG ) 3.125 MG tablet Take 1 tablet (3.125 mg total) by mouth 2 (two) times daily. 180 tablet 3   empagliflozin  (JARDIANCE ) 10 MG TABS tablet Take 1 tablet (10 mg total) by mouth daily before breakfast. 30 tablet 11   ezetimibe  (ZETIA ) 10  MG tablet Take 1 tablet (10 mg total) by mouth daily. 90 tablet 3   nicotine  polacrilex (NICORETTE ) 4 MG gum Take 1 each (4 mg total) by mouth as needed for smoking cessation. 100 tablet 0   nitroGLYCERIN  (NITROSTAT ) 0.4 MG SL tablet Place 1 tablet (0.4 mg total) under the tongue every 5 (five) minutes as needed for chest pain. 25 tablet 3   predniSONE  (DELTASONE ) 20 MG tablet Take 2 tablets (40 mg total) by mouth daily with breakfast. 10 tablet 0   rosuvastatin  (CRESTOR ) 40 MG tablet Take 1 tablet (40 mg total) by mouth daily. 90 tablet 3   sacubitril -valsartan  (ENTRESTO ) 24-26 MG Take 1 tablet by mouth 2 (two) times daily. 180 tablet 3   umeclidinium bromide  (INCRUSE ELLIPTA ) 62.5 MCG/ACT AEPB Inhale 1 puff into the lungs daily. 30 each 2   No facility-administered medications prior to visit.    PAST MEDICAL HISTORY: Past Medical History:  Diagnosis Date   Alcohol abuse 03/12/2016   Appendicitis 04/29/2021   CAD (coronary artery disease) cardiologist-- dr pietro   a. 03/2016 NSTEMI/PCI: LM nl, LAD nl, RI nl, LCX nl, OM2 99 ( 2.75 x 16 Synergy DES), RCA nl, EF 45-50%.   ETOH abuse    GERD (gastroesophageal reflux disease)    History of cocaine abuse (HCC)    per pt none since 2017   History of non-ST elevation myocardial infarction (NSTEMI) 03/14/2016   s/p  PCI and DES x1   Hyperlipidemia    Hypertension    Hypertensive heart  disease    Ischemic cardiomyopathy    a. 03/2016 Echo: EF 35-40%, diff H, sev inflat HK, Gr2 DD;  b. 03/2016 EF 45-50% by LV gram.;  echo 10-24-2018, ef 45-50%   Knee effusion, left 08/15/2018   Myocardial infarction [facility]) 2017   NSTEMI    OA (osteoarthritis)    left knee   Obstructive sleep apnea 06/01/2023   S/P drug eluting coronary stent placement 03/14/2016   DES x1 to OM1   Tobacco abuse    Wears dentures    upper   Wears partial dentures    lower    PAST SURGICAL HISTORY: Past Surgical History:  Procedure Laterality Date   CARDIAC CATHETERIZATION N/A 03/14/2016   Procedure: Left Heart Cath and Coronary Angiography;  Surgeon: Lonni JONETTA Cash, MD;  Location: [facility] INVASIVE CV LAB;  Service: Cardiovascular;  Laterality: N/A;   CORONARY ANGIOPLASTY WITH STENT PLACEMENT     FACIAL COSMETIC SURGERY     at age 69 yrs old   LACERATION REPAIR  age 88   face around eye   LAPAROSCOPIC APPENDECTOMY N/A 04/29/2021   Procedure: APPENDECTOMY LAPAROSCOPIC;  Surgeon: Rubin Calamity, MD;  Location[facility];  Service: General;  Laterality: N/A;   PARTIAL KNEE ARTHROPLASTY Left 11/13/2018   Procedure: UNICOMPARTMENTAL KNEE;  Surgeon: Beverley Evalene JONETTA, MD;  Location: [facility] ORS;  Service: Orthopedics;  Laterality: Left;   WRIST SURGERY Left 01/31/2020    FAMILY HISTORY: Family History  Problem Relation Age of Onset   Heart attack Father    Heart attack Sister    Colon polyps Maternal Uncle    Colon cancer Neg Hx    Esophageal cancer Neg Hx    Stomach cancer Neg Hx    Rectal cancer Neg Hx    Sleep apnea Neg Hx     SOCIAL HISTORY: Social History   Socioeconomic History   Marital status: Legally Separated    Spouse name: Not on file  Number of children: 8   Years of education: Not on file   Highest education level: Not on file  Occupational History   Not on file  Tobacco Use   Smoking status: Some Days    Current packs/day: 0.50    Average packs/day: 0.5 packs/day for 35.0 years  (17.5 ttl pk-yrs)    Types: Cigarettes   Smokeless tobacco: Never   Tobacco comments:    some days less  6  per day   Vaping Use   Vaping status: Never Used  Substance and Sexual Activity   Alcohol use: Yes    Alcohol/week: 5.0 standard drinks of alcohol    Types: 1 Cans of beer, 4 Shots of liquor per week    Comment: 1/3 pint daily   Drug use: Not Currently    Types: Cocaine    Comment: 11-06-2018 per pt last cocaine when I had my heart attack  2017   Sexual activity: Not Currently  Other Topics Concern   Not on file  Social History Narrative   Caffiene 1/2 cup coffee daily   Retired:  [employer]      Social Drivers of Corporate investment banker Strain: Low Risk  (11/22/2022)   Overall Financial Resource Strain (CARDIA)    Difficulty of Paying Living Expenses: Not very hard  Food Insecurity: No Food Insecurity (10/16/2023)   Hunger Vital Sign    Worried About Running Out of Food in the Last Year: Never true    Ran Out of Food in the Last Year: Never true  Transportation Needs: No Transportation Needs (11/22/2022)   PRAPARE - Administrator, Civil Service (Medical): No    Lack of Transportation (Non-Medical): No  Physical Activity: Insufficiently Active (11/22/2022)   Exercise Vital Sign    Days of Exercise per Week: 1 day    Minutes of Exercise per Session: 10 min  Stress: No Stress Concern Present (11/22/2022)   [employer] of Occupational Health - Occupational Stress Questionnaire    Feeling of Stress : Only a little  Social Connections: Not on File (05/27/2023)   Received from [facility]   Social Connections    Connectedness: 0  Intimate Partner Violence: Not At Risk (10/16/2023)   Humiliation, Afraid, Rape, and Kick questionnaire    Fear of Current or Ex-Partner: No    Emotionally Abused: No    Physically Abused: No    Sexually Abused: No     PHYSICAL EXAM  There were no vitals filed for this visit.  There is no height or weight on file to  calculate BMI.  Generalized: Well developed, in no acute distress  Cardiology: normal rate and rhythm, no murmur noted Respiratory: clear to auscultation bilaterally  Neurological examination  Mentation: Alert oriented to time, place, history taking. Follows all commands speech and language fluent Cranial nerve II-XII: Pupils were equal round reactive to light. Extraocular movements were full, visual field were full  Motor: The motor testing reveals 5 over 5 strength of all 4 extremities. Good symmetric motor tone is noted throughout.  Gait and station: Gait is normal.    DIAGNOSTIC DATA (LABS, IMAGING, TESTING) - I reviewed patient records, labs, notes, testing and imaging myself where available.      No data to display           Lab Results  Component Value Date   WBC 7.3 02/08/2024   HGB 17.9 (H) 02/08/2024   HCT 52.9 (H) 02/08/2024   MCV  93.3 02/08/2024   PLT 192 02/08/2024      Component Value Date/Time   NA 139 02/08/2024 1122   NA 146 (H) 09/18/2023 1112   K 4.4 02/08/2024 1122   CL 108 02/08/2024 1122   CO2 19 (L) 02/08/2024 1122   GLUCOSE 128 (H) 02/08/2024 1122   BUN 10 02/08/2024 1122   BUN 12 09/18/2023 1112   CREATININE 0.95 02/08/2024 1122   CREATININE 1.03 03/23/2016 1433   CALCIUM  9.0 02/08/2024 1122   PROT 6.8 11/19/2021 1448   ALBUMIN 4.5 11/19/2021 1448   AST 20 11/19/2021 1448   ALT 11 11/19/2021 1448   ALKPHOS 80 11/19/2021 1448   BILITOT 0.6 11/19/2021 1448   GFRNONAA >60 02/08/2024 1122   GFRAA 86 02/18/2020 1024   Lab Results  Component Value Date   CHOL 179 09/18/2023   HDL 42 09/18/2023   LDLCALC 117 (H) 09/18/2023   TRIG 110 09/18/2023   CHOLHDL 4.3 09/18/2023   Lab Results  Component Value Date   HGBA1C 6.4 (H) 09/18/2023   No results found for: VITAMINB12 No results found for: TSH   ASSESSMENT AND PLAN 68 y.o. year old male  has a past medical history of Alcohol abuse (03/12/2016), Appendicitis (04/29/2021), CAD  (coronary artery disease) (cardiologist-- dr pietro), ETOH abuse, GERD (gastroesophageal reflux disease), History of cocaine abuse (HCC), History of non-ST elevation myocardial infarction (NSTEMI) (03/14/2016), Hyperlipidemia, Hypertension, Hypertensive heart disease, Ischemic cardiomyopathy, Knee effusion, left (08/15/2018), Myocardial infarction (HCC) (2017), OA (osteoarthritis), Obstructive sleep apnea (06/01/2023), S/P drug eluting coronary stent placement (03/14/2016), Tobacco abuse, Wears dentures, and Wears partial dentures. here with   No diagnosis found.    Jackee LITTIE Mayotte has had difficulty adjusting to autoPAP therapy. Compliance report reveals sub optimal compliance. We have discussed sleep study results and risks of untreated sleep apnea. He does endorse a desire to continue using therapy. He was encouraged to continue using CPAP nightly and for greater than 4 hours each night. I will send orders for a mask refitting. Discussed trial of nasal mask to avoid pressure on bottom gums. We will update supply orders as indicated. Risks of untreated sleep apnea review and education materials provided. Healthy lifestyle habits encouraged. He will follow up in 4-6 months, sooner if needed. He verbalizes understanding and agreement with this plan.    No orders of the defined types were placed in this encounter.    No orders of the defined types were placed in this encounter.    I spent 30 minutes of face-to-face and non-face-to-face time with patient.  This included previsit chart review, lab review, study review, order entry, electronic health record documentation, patient education.   Greig Forbes, FNP-C 05/01/2024, 10:48 AM [facility] [address], [address] [place], [place] [address] [phone]

## 2024-05-02 ENCOUNTER — Ambulatory Visit (INDEPENDENT_AMBULATORY_CARE_PROVIDER_SITE_OTHER): Admitting: Family Medicine

## 2024-05-02 ENCOUNTER — Encounter: Payer: Self-pay | Admitting: Family Medicine

## 2024-05-02 ENCOUNTER — Telehealth: Payer: Self-pay | Admitting: Family Medicine

## 2024-05-02 VITALS — BP 136/83 | HR 58 | Ht 67.0 in | Wt 166.5 lb

## 2024-05-02 DIAGNOSIS — G4733 Obstructive sleep apnea (adult) (pediatric): Secondary | ICD-10-CM

## 2024-05-02 NOTE — Telephone Encounter (Signed)
 Referral for Dentistry faxed to [facility] Phone:[phone] Fax:[phone]

## 2024-05-20 ENCOUNTER — Other Ambulatory Visit: Payer: Self-pay

## 2024-05-20 DIAGNOSIS — J441 Chronic obstructive pulmonary disease with (acute) exacerbation: Secondary | ICD-10-CM

## 2024-05-20 MED ORDER — INCRUSE ELLIPTA 62.5 MCG/ACT IN AEPB: 1.0000 | INHALATION_SPRAY | Freq: Every day | RESPIRATORY_TRACT | 2 refills | Status: DC

## 2024-05-20 NOTE — Telephone Encounter (Signed)
 Medication sent to pharmacy

## 2024-06-03 NOTE — Telephone Encounter (Signed)
 Dr. Micky office faxed over referral form to filled out signed and dated and faxed back.SABRA Greig Forbes, NP completed referral form for Dr. Carmin office. Refax to [phone]

## 2024-07-24 ENCOUNTER — Ambulatory Visit: Payer: Self-pay | Admitting: Cardiology

## 2024-07-24 ENCOUNTER — Ambulatory Visit (HOSPITAL_COMMUNITY)
Admission: RE | Admit: 2024-07-24 | Discharge: 2024-07-24 | Disposition: A | Discharge status: Home / Self Care | Source: Ambulatory Visit | Attending: Internal Medicine | Admitting: Internal Medicine

## 2024-07-24 DIAGNOSIS — I255 Ischemic cardiomyopathy: Secondary | ICD-10-CM | POA: Diagnosis present

## 2024-07-24 LAB — ECHOCARDIOGRAM COMPLETE
Area-P 1/2: 3.43 cm2
S' Lateral: 4.49 cm

## 2024-07-24 MED ORDER — PERFLUTREN LIPID MICROSPHERE: 1.0000 mL | INTRAVENOUS | Status: AC | PRN

## 2024-07-25 NOTE — Telephone Encounter (Signed)
 Patient returned RN's call regarding results.

## 2024-08-21 ENCOUNTER — Ambulatory Visit

## 2024-08-29 ENCOUNTER — Other Ambulatory Visit: Payer: Self-pay

## 2024-08-29 DIAGNOSIS — J441 Chronic obstructive pulmonary disease with (acute) exacerbation: Secondary | ICD-10-CM

## 2024-08-29 NOTE — Telephone Encounter (Signed)
 Medication sent to pharmacy

## 2024-09-19 ENCOUNTER — Other Ambulatory Visit: Payer: Self-pay | Admitting: Student

## 2024-09-19 ENCOUNTER — Other Ambulatory Visit (HOSPITAL_COMMUNITY)
Admission: RE | Admit: 2024-09-19 | Discharge: 2024-09-19 | Disposition: A | Source: Ambulatory Visit | Attending: Family Medicine | Admitting: Family Medicine

## 2024-09-19 ENCOUNTER — Ambulatory Visit: Payer: Self-pay

## 2024-09-19 VITALS — BP 157/100 | HR 65 | Temp 98.1°F | Ht 67.0 in | Wt 171.6 lb

## 2024-09-19 DIAGNOSIS — F17210 Nicotine dependence, cigarettes, uncomplicated: Secondary | ICD-10-CM | POA: Diagnosis not present

## 2024-09-19 DIAGNOSIS — Z720 Tobacco use: Secondary | ICD-10-CM

## 2024-09-19 DIAGNOSIS — I110 Hypertensive heart disease with heart failure: Secondary | ICD-10-CM

## 2024-09-19 DIAGNOSIS — J449 Chronic obstructive pulmonary disease, unspecified: Secondary | ICD-10-CM

## 2024-09-19 DIAGNOSIS — R7303 Prediabetes: Secondary | ICD-10-CM | POA: Diagnosis not present

## 2024-09-19 DIAGNOSIS — Z23 Encounter for immunization: Secondary | ICD-10-CM

## 2024-09-19 DIAGNOSIS — I10 Essential (primary) hypertension: Secondary | ICD-10-CM

## 2024-09-19 DIAGNOSIS — Z7251 High risk heterosexual behavior: Secondary | ICD-10-CM | POA: Diagnosis present

## 2024-09-19 DIAGNOSIS — I5020 Unspecified systolic (congestive) heart failure: Secondary | ICD-10-CM | POA: Diagnosis not present

## 2024-09-19 DIAGNOSIS — Z0000 Encounter for general adult medical examination without abnormal findings: Secondary | ICD-10-CM

## 2024-09-19 MED ORDER — SPIRONOLACTONE 25 MG PO TABS: 25.0000 mg | ORAL_TABLET | Freq: Every day | ORAL | 11 refills | Status: AC

## 2024-09-19 MED ORDER — VARENICLINE TARTRATE 0.5 MG PO TABS: 0.5000 mg | ORAL_TABLET | Freq: Two times a day (BID) | ORAL | 2 refills | Status: AC

## 2024-09-19 MED ORDER — STIOLTO RESPIMAT 2.5-2.5 MCG/ACT IN AERS: 2.0000 | INHALATION_SPRAY | Freq: Every day | RESPIRATORY_TRACT | 12 refills | Status: AC

## 2024-09-19 MED ORDER — NICOTINE 7 MG/24HR TD PT24: 7.0000 mg | MEDICATED_PATCH | TRANSDERMAL | 2 refills | Status: AC

## 2024-09-19 NOTE — Assessment & Plan Note (Addendum)
 He has a history of hypertension. His current regimen is Entresto  24-26 mg, Jardiance  10 mg daily, carvedilol  3.125 mg BID. He reports that he is adherent to his medications. He denies headaches, chest pain, shortness of breath, peripheral edema, and vision changes. His BP in the office today is 157/100. As he is only on 3 out of 4 GDMT, we will start him on an MRA today for his BP and his heart failure.  -Start Spironolactone  25mg  daily -Continue Entresto  24-26 mg - Continue Jardiance  10 mg daily - Continue carvedilol  3.125 mg twice daily

## 2024-09-19 NOTE — Assessment & Plan Note (Addendum)
 He is still smoking 4-5 cigarretes packs per day.  We had an extensive discussion regarding tobacco cessation.  He states that he thinks that the thing that is keeping him from stopping at this point is tobacco cravings.  He also has another housemate who is trying to stop smoking.  We advised that they set a quit date together and throughout all ashtrays, cigarettes and things to remind him of smoking.  He has tried nicotine  patches in the past and they have been unhelpful for him.  He is unable to do nicotine  gum.  He is interesting in trying Chantix  at this time as well.  He is advised that tobacco smoking increases his chance of lung cancer, and worsens his COPD.  He expressed understanding with this and seems ready to quit.  He started smoking when he was in his teens.  He has smoked about a half a pack a day since his teens giving him a 2 5-year pack history of smoking and he is a current smoker.  We will get a low-dose CT scan per USPSTF guidelines.  8 minutes was spent discussing tobacco cessation.  -Start chantix  0.5mg  BID -Start nicotine  patches 7mg  daily -Advised to decide joint quit date with house mate -Low-dose CT scan given 25 pack year smoking history and current smoker.

## 2024-09-19 NOTE — Assessment & Plan Note (Signed)
 Provided with flu-vaccine today. Will also get low-dose CT scan as above

## 2024-09-19 NOTE — Assessment & Plan Note (Signed)
 Patient requesting screening for STIs today after unprotected sexual encounters.  He is asymptomatic.  He denies discharge, penile pain, ulcerations.  - HIV with reflex - RPR - G/C/T urine

## 2024-09-19 NOTE — Assessment & Plan Note (Addendum)
 Last A1c was 6.4 in January of last year.  He is currently on Jardiance  for heart failure. Denies polyuria/polydipsia.  -Check A1C today

## 2024-09-19 NOTE — Progress Notes (Signed)
 "  CC: Routine Follow Up for DMII after last office visit 02/14/2024  HPI:  Lucas Stark is a 69 y.o. male with pertinent PMH of DMII, OSA, CAD, ischemic cardiomyopathy, BP, HLD, tobacco use, who presents for chronic condition follow up. Please see problem based assessment and plan for further history.  Patient was last seen in clinic on June 4 for a COPD exacerbation for which she was treated with prednisone  and Incruse Ellipta .  She was told to follow-up in 3 months for diabetes follow-up.  Since that time she is seen by cardiology for managing her CAD and ischemic cardiomyopathy along with neurology who are managing her OSA on CPAP  ROS  Medications: Current Outpatient Medications  Medication Instructions   albuterol  (VENTOLIN  HFA) 108 (90 Base) MCG/ACT inhaler 2 puffs, Inhalation, Every 6 hours PRN   aspirin  EC 81 mg, Daily   benzonatate  (TESSALON ) 100 mg, Oral, Every 8 hours   carvedilol  (COREG ) 3.125 mg, Oral, 2 times daily   empagliflozin  (JARDIANCE ) 10 MG TABS tablet TAKE 1 TABLET(10 MG) BY MOUTH DAILY BEFORE BREAKFAST   ezetimibe  (ZETIA ) 10 mg, Oral, Daily   nicotine  (NICODERM CQ  - DOSED IN MG/24 HR) 7 mg, Transdermal, Every 24 hours   nicotine  polacrilex (NICORETTE ) 4 mg, Oral, As needed   nitroGLYCERIN  (NITROSTAT ) 0.4 mg, Sublingual, Every 5 min PRN   rosuvastatin  (CRESTOR ) 40 mg, Oral, Daily   sacubitril -valsartan  (ENTRESTO ) 24-26 MG 1 tablet, Oral, 2 times daily   spironolactone  (ALDACTONE ) 25 mg, Oral, Daily   Tiotropium Bromide-Olodaterol (STIOLTO RESPIMAT ) 2.5-2.5 MCG/ACT AERS 2 puffs, Inhalation, Daily   varenicline  (CHANTIX ) 0.5 mg, Oral, 2 times daily     Physical Exam:  Vitals:   09/19/24 0814 09/19/24 0846  BP: (!) 162/95 (!) 157/100  Pulse: 64 65  Temp: 98.1 F (36.7 C)   TempSrc: Oral   SpO2: 100%   Weight: 171 lb 9.6 oz (77.8 kg)   Height: 5' 7 (1.702 m)     Physical Exam Constitutional:      General: He is not in acute distress.    Appearance:  He is not ill-appearing.  Cardiovascular:     Rate and Rhythm: Normal rate and regular rhythm.     Heart sounds: No murmur heard.    No friction rub. No gallop.  Pulmonary:     Effort: No respiratory distress.     Breath sounds: No wheezing, rhonchi or rales.  Musculoskeletal:     Right lower leg: No edema.     Left lower leg: No edema.  Neurological:     Mental Status: He is alert.       Assessment & Plan:   Assessment & Plan Chronic obstructive pulmonary disease, unspecified COPD type (HCC) He reports that he has had a bothersome cough to him for the last 2 months.  Cough is worse when he is in his own home and does go away when he is out of his home mostly.  He states that out of his home he coughs about once every hour or so but in his home he coughs once or twice every 10 minutes.  He has not noticed that he is more short of breath than normal.  His cough is not productive of any pain.  He has not had any sick contacts.  He denies congestion, fevers, chills, weight loss.  He does report that he had some mold in his house that has been getting worse recently and I wonder if this is  causing his symptoms to worsen given the location differences in his cough.  On physical exam, his lungs are clear to auscultation bilaterally.  He has a previous diagnosis of COPD via PFTs in 2024 but there was also a restrictive pattern at that time. His current medications include albuterol  rescue inhaler along with Incruse Ellipta .  He reports good compliance with this medication.  He was last seen for a COPD exacerbation in June of last year.  Given his exacerbation last year and worsening cough, we will start him on LAMA LABA therapy.  -Check CBC with differential for eosinophilia, if peripheral eosinophilia present we may need to start him on ICS therapy - Start Stiolto Respimat  twice daily - Stop Incruse Ellipta  - Continue albuterol  rescue inhaler - Tobacco cessation as discussed below - Return in  3 months - Given his chronic cough and smoking history we will also get low-dose CT scan to evaluate for any malignancy.  This may also elucidate a cause of his chronic cough.  Benign essential HTN He has a history of hypertension. His current regimen is Entresto  24-26 mg, Jardiance  10 mg daily, carvedilol  3.125 mg BID. He reports that he is adherent to his medications. He denies headaches, chest pain, shortness of breath, peripheral edema, and vision changes. His BP in the office today is 157/100. As he is only on 3 out of 4 GDMT, we will start him on an MRA today for his BP and his heart failure.  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  Last EF in November of this year was 35 to 40%.  Current GDMT includes Entresto  24 to 26 mg, Jardiance  10 mg daily, carvedilol  3.125 mg twice daily.  He is not currently on any MRA therapy.  He reports since seemingly takes the carvedilol  once daily because it makes him sleepy evening.  He was advised to take his medication right before bed. No signs of volume overload at this time  -Start Spironolactone  25mg  daily -Continue Entresto  24-26 mg - Continue Jardiance  10 mg daily - Continue carvedilol  3.125 mg twice daily Tobacco abuse He is still smoking 4-5 cigarretes packs per day.  We had an extensive discussion regarding tobacco cessation.  He states that he thinks that the thing that is keeping him from stopping at this point is tobacco cravings.  He also has another housemate who is trying to stop smoking.  We advised that they set a quit date together and throughout all ashtrays, cigarettes and things to remind him of smoking.  He has tried nicotine  patches in the past and they have been unhelpful for him.  He is unable to  do nicotine  gum.  He is interesting in trying Chantix  at this time as well.  He is advised that tobacco smoking increases his chance of lung cancer, and worsens his COPD.  He expressed understanding with this and seems ready to quit.  He started smoking when he was in his teens.  He has smoked about a half a pack a day since his teens giving him a 2 5-year pack history of smoking and he is a current smoker.  We will get a low-dose CT scan per USPSTF guidelines.  8 minutes was spent discussing tobacco cessation.  -Start chantix  0.5mg  BID -Start nicotine  patches 7mg  daily -Advised  to decide joint quit date with house mate -Low-dose CT scan given 25 pack year smoking history and current smoker.  Prediabetes Last A1c was 6.4 in January of last year.  He is currently on Jardiance  for heart failure. Denies polyuria/polydipsia.  -Check A1C today Healthcare maintenance Provided with flu-vaccine today. Will also get low-dose CT scan as above High risk heterosexual behavior Patient requesting screening for STIs today after unprotected sexual encounters.  He is asymptomatic.  He denies discharge, penile pain, ulcerations.  - HIV with reflex - RPR - G/C/T urine  Encounter for immunization Provided with high-dose flu vaccine today   Orders Placed This Encounter  Procedures   CT CHEST LUNG CA SCREEN LOW DOSE W/O CM    Standing Status:   Future    Expiration Date:   09/19/2025    Preferred Imaging Location?:   [facility]   Flu vaccine HIGH DOSE PF(Fluzone Trivalent)   Hemoglobin A1c   HIV antibody (with reflex)   STI Profile, CT/NG/TV   CBC with Diff   Basic metabolic panel with GFR   RPR W/RFLX TO RPR TITER, TREPONEMAL AB, SCREEN AND DIAGNOSIS    Patient discussed with Dr. Layman Jeanelle Melvenia Napoleon, MD [facility] Internal Medicine Resident PGY-1 Clinic Phone: [phone] Please contact the on call pager at [phone] for any urgent or emergent  needs.  "

## 2024-09-19 NOTE — Patient Instructions (Signed)
 Thank you, [name], for allowing us  to provide your care today. Today we discussed . . .  > High blood pressure       - I have added on a medication called spironolactone  to help with your blood pressure.  Please continue taking the rest of your medications. > Chronic cough       - I will start you on an antibiotic called Stiolto Respimat .  Please stop your Incruse Ellipta  inhaler.  I will send you for a CT scan to search for any cause of your cough. > Smoking       - Please set a stop date to stop smoking. I have added on a medication called chantix  and have prescribed you nicotine  patches as well. Work together with your housemate to clean all of the ash trays and anything that reminds you of smoking out of the house.   I have ordered the following labs for you:   Lab Orders         Hemoglobin A1c         HIV antibody (with reflex)         STI Profile, CT/NG/TV         CBC with Diff         Basic metabolic panel with GFR       Referrals ordered today:   Referral Orders  No referral(s) requested today      Follow up: 3 months    Remember:  Should you have any questions or concerns please call the internal medicine clinic at [phone].     Lucas Stark, [facility]

## 2024-09-19 NOTE — Assessment & Plan Note (Addendum)
 He reports that he has had a bothersome cough to him for the last 2 months.  Cough is worse when he is in his own home and does go away when he is out of his home mostly.  He states that out of his home he coughs about once every hour or so but in his home he coughs once or twice every 10 minutes.  He has not noticed that he is more short of breath than normal.  His cough is not productive of any pain.  He has not had any sick contacts.  He denies congestion, fevers, chills, weight loss.  He does report that he had some mold in his house that has been getting worse recently and I wonder if this is causing his symptoms to worsen given the location differences in his cough.  On physical exam, his lungs are clear to auscultation bilaterally.  He has a previous diagnosis of COPD via PFTs in 2024 but there was also a restrictive pattern at that time. His current medications include albuterol  rescue inhaler along with Incruse Ellipta .  He reports good compliance with this medication.  He was last seen for a COPD exacerbation in June of last year.  Given his exacerbation last year and worsening cough, we will start him on LAMA LABA therapy.  -Check CBC with differential for eosinophilia, if peripheral eosinophilia present we may need to start him on ICS therapy - Start Stiolto Respimat  twice daily - Stop Incruse Ellipta  - Continue albuterol  rescue inhaler - Tobacco cessation as discussed below - Return in 3 months - Given his chronic cough and smoking history we will also get low-dose CT scan to evaluate for any malignancy.  This may also elucidate a cause of his chronic cough.

## 2024-09-19 NOTE — Assessment & Plan Note (Addendum)
 Follows with cardiology for heart failure with mildly reduced ejection fraction.  Last EF in November of this year was 35 to 40%.  Current GDMT includes Entresto  24 to 26 mg, Jardiance  10 mg daily, carvedilol  3.125 mg twice daily.  He is not currently on any MRA therapy.  He reports since seemingly takes the carvedilol  once daily because it makes him sleepy evening.  He was advised to take his medication right before bed. No signs of volume overload at this time  -Start Spironolactone  25mg  daily -Continue Entresto  24-26 mg - Continue Jardiance  10 mg daily - Continue carvedilol  3.125 mg twice daily

## 2024-09-19 NOTE — Telephone Encounter (Signed)
 Medication sent to pharmacy

## 2024-09-20 ENCOUNTER — Ambulatory Visit: Payer: Self-pay

## 2024-09-20 LAB — CBC WITH DIFFERENTIAL/PLATELET
Basophils Absolute: 0 x10E3/uL (ref 0.0–0.2)
Basos: 1 %
EOS (ABSOLUTE): 0.1 x10E3/uL (ref 0.0–0.4)
Eos: 2 %
Hematocrit: 53.5 % — ABNORMAL HIGH (ref 37.5–51.0)
Hemoglobin: 18.2 g/dL — ABNORMAL HIGH (ref 13.0–17.7)
Immature Grans (Abs): 0 x10E3/uL (ref 0.0–0.1)
Immature Granulocytes: 0 %
Lymphocytes Absolute: 1.7 x10E3/uL (ref 0.7–3.1)
Lymphs: 35 %
MCH: 32.1 pg (ref 26.6–33.0)
MCHC: 34 g/dL (ref 31.5–35.7)
MCV: 94 fL (ref 79–97)
Monocytes Absolute: 0.6 x10E3/uL (ref 0.1–0.9)
Monocytes: 12 %
Neutrophils Absolute: 2.4 x10E3/uL (ref 1.4–7.0)
Neutrophils: 50 %
Platelets: 165 x10E3/uL (ref 150–450)
RBC: 5.67 x10E6/uL (ref 4.14–5.80)
RDW: 13.4 % (ref 11.6–15.4)
WBC: 4.8 x10E3/uL (ref 3.4–10.8)

## 2024-09-20 LAB — BASIC METABOLIC PANEL WITH GFR
BUN/Creatinine Ratio: 14 (ref 10–24)
BUN: 15 mg/dL (ref 8–27)
CO2: 24 mmol/L (ref 20–29)
Calcium: 9.5 mg/dL (ref 8.6–10.2)
Chloride: 104 mmol/L (ref 96–106)
Creatinine, Ser: 1.05 mg/dL (ref 0.76–1.27)
Glucose: 99 mg/dL (ref 70–99)
Potassium: 4.7 mmol/L (ref 3.5–5.2)
Sodium: 141 mmol/L (ref 134–144)
eGFR: 78 mL/min/1.73

## 2024-09-20 LAB — URINE CYTOLOGY ANCILLARY ONLY
Chlamydia: NEGATIVE
Comment: NEGATIVE
Comment: NEGATIVE
Comment: NORMAL
Neisseria Gonorrhea: NEGATIVE
Trichomonas: NEGATIVE

## 2024-09-20 LAB — HEMOGLOBIN A1C
Est. average glucose Bld gHb Est-mCnc: 128 mg/dL
Hgb A1c MFr Bld: 6.1 % — ABNORMAL HIGH (ref 4.8–5.6)

## 2024-09-20 LAB — HIV ANTIBODY (ROUTINE TESTING W REFLEX): HIV Screen 4th Generation wRfx: NONREACTIVE

## 2024-09-20 LAB — SYPHILIS: RPR W/REFLEX TO RPR TITER AND TREPONEMAL ANTIBODIES, TRADITIONAL SCREENING AND DIAGNOSIS ALGORITHM: RPR Ser Ql: NONREACTIVE

## 2024-09-23 ENCOUNTER — Telehealth: Payer: Self-pay | Admitting: *Deleted

## 2024-09-23 NOTE — Telephone Encounter (Signed)
 Will forward to Dr. Napoleon.               Copied from CRM [id number]. Topic: Clinical - Lab/Test Results >> Sep 20, 2024  3:37 PM Mercer PEDLAR wrote: Reason for CRM: Patient is requesting a callback for lab results.

## 2024-10-02 ENCOUNTER — Ambulatory Visit: Payer: Self-pay

## 2024-10-02 DIAGNOSIS — Z0000 Encounter for general adult medical examination without abnormal findings: Secondary | ICD-10-CM

## 2024-10-02 NOTE — Patient Instructions (Addendum)
 Lucas Stark,  Thank you for taking the time for your Medicare Wellness Visit. I appreciate your continued commitment to your health goals. Please review the care plan we discussed, and feel free to reach out if I can assist you further.  Please note that Annual Wellness Visits do not include a physical exam. Some assessments may be limited, especially if the visit was conducted virtually. If needed, we may recommend an in-person follow-up with your provider.  Ongoing Care Seeing your primary care provider every 3 to 6 months helps us  monitor your health and provide consistent, personalized care. APPT FOR CT 1/23 @ 1:30 AT [facility]  Referrals If a referral was made during today's visit and you haven't received any updates within two weeks, please contact the referred provider directly to check on the status.  Recommended Screenings:  Health Maintenance  Topic Date Due   Screening for Lung Cancer  09/27/2007   Zoster (Shingles) Vaccine (1 of 2) Never done   Medicare Annual Wellness Visit  11/22/2023   COVID-19 Vaccine (3 - 2025-26 season) 05/13/2024   Colon Cancer Screening  04/28/2027   DTaP/Tdap/Td vaccine (2 - Td or Tdap) 11/21/2032   Pneumococcal Vaccine for age over 68  Completed   Flu Shot  Completed   Hepatitis C Screening  Completed   Meningitis B Vaccine  Aged Out     Vision: Annual vision screenings are recommended for early detection of glaucoma, cataracts, and diabetic retinopathy. These exams can also reveal signs of chronic conditions such as diabetes and high blood pressure.  Dental: Annual dental screenings help detect early signs of oral cancer, gum disease, and other conditions linked to overall health, including heart disease and diabetes.  Please see the attached documents for additional preventive care recommendations.

## 2024-10-02 NOTE — Progress Notes (Unsigned)
 "  Chief Complaint  Patient presents with   Medicare Wellness     Subjective:   Lucas Stark is a 69 y.o. male who presents for a Medicare Annual Wellness Visit.  Visit info / Clinical Intake: Medicare Wellness Visit Type:: Subsequent Annual Wellness Visit Persons participating in visit and providing information:: patient Medicare Wellness Visit Mode:: Telephone If telephone:: video declined Since this visit was completed virtually, some vitals may be partially provided or unavailable. Missing vitals are due to the limitations of the virtual format.: Unable to obtain vitals - no equipment If Telephone or Video please confirm:: I connected with patient using audio/video enable telemedicine. I verified patient identity with two identifiers, discussed telehealth limitations, and patient agreed to proceed. Patient Location:: HOME Provider Location:: OFFICE Interpreter Needed?: No Pre-visit prep was completed: yes AWV questionnaire completed by patient prior to visit?: no Living arrangements:: (!) lives alone Patient's Overall Health Status Rating: (!) fair Typical amount of pain: none Does pain affect daily life?: no Are you currently prescribed opioids?: no  Dietary Habits and Nutritional Risks How many meals a day?: 3 Eats fruit and vegetables daily?: (!) no Most meals are obtained by: preparing own meals In the last 2 weeks, have you had any of the following?: none Diabetic:: no  Functional Status Activities of Daily Living (to include ambulation/medication): Independent Ambulation: Independent Medication Administration: Independent Home Management (perform basic housework or laundry): Independent Manage your own finances?: yes Primary transportation is: driving Concerns about vision?: no *vision screening is required for [facility]* (READERS- MD IN [place]- APPT IN [place]) Concerns about hearing?: no  Fall Screening Falls in the past year?: 0 Number of falls in past year:  0 Was there an injury with Fall?: 0 Fall Risk Category Calculator: 0 Patient Fall Risk Level: Low Fall Risk  Fall Risk Patient at Risk for Falls Due to: No Fall Risks Fall risk Follow up: Falls evaluation completed; Falls prevention discussed  Home and Transportation Safety: All rugs have non-skid backing?: yes All stairs or steps have railings?: yes Grab bars in the bathtub or shower?: (!) no Have non-skid surface in bathtub or shower?: yes Good home lighting?: yes Regular seat belt use?: yes Hospital stays in the last year:: no  Cognitive Assessment Difficulty concentrating, remembering, or making decisions? : no Will 6CIT or Mini Cog be Completed: yes What year is it?: 0 points What month is it?: 0 points Give patient an address phrase to remember (5 components): [address] [place], [place] About what time is it?: 0 points Count backwards from 20 to 1: 0 points Say the months of the year in reverse: 0 points Repeat the address phrase from earlier: 0 points 6 CIT Score: 0 points  Advance Directives (For Healthcare) Does Patient Have a Medical Advance Directive?: No Type of Advance Directive: Healthcare Power of Attorney Would patient like information on creating a medical advance directive?: No - Patient declined  Reviewed/Updated  Reviewed/Updated: Reviewed All (Medical, Surgical, Family, Medications, Allergies, Care Teams, Patient Goals)    Allergies (verified) Advil  [ibuprofen ], Other, and Oxycodone    Current Medications (verified) Outpatient Encounter Medications as of 10/02/2024  Medication Sig   albuterol  (VENTOLIN  HFA) 108 (90 Base) MCG/ACT inhaler Inhale 2 puffs into the lungs every 6 (six) hours as needed for wheezing or shortness of breath.   aspirin  EC 81 MG tablet Take 81 mg by mouth daily. Swallow whole.   carvedilol  (COREG ) 3.125 MG tablet Take 1 tablet (3.125 mg total) by  mouth 2 (two) times daily.   empagliflozin  (JARDIANCE ) 10 MG TABS tablet TAKE 1  TABLET(10 MG) BY MOUTH DAILY BEFORE BREAKFAST   ezetimibe  (ZETIA ) 10 MG tablet Take 1 tablet (10 mg total) by mouth daily.   nitroGLYCERIN  (NITROSTAT ) 0.4 MG SL tablet Place 1 tablet (0.4 mg total) under the tongue every 5 (five) minutes as needed for chest pain.   rosuvastatin  (CRESTOR ) 40 MG tablet Take 1 tablet (40 mg total) by mouth daily.   sacubitril -valsartan  (ENTRESTO ) 24-26 MG Take 1 tablet by mouth 2 (two) times daily.   spironolactone  (ALDACTONE ) 25 MG tablet Take 1 tablet (25 mg total) by mouth daily.   Tiotropium Bromide-Olodaterol (STIOLTO RESPIMAT ) 2.5-2.5 MCG/ACT AERS Inhale 2 puffs into the lungs daily.   nicotine  (NICODERM CQ  - DOSED IN MG/24 HR) 7 mg/24hr patch Place 1 patch (7 mg total) onto the skin daily. (Patient not taking: Reported on 10/02/2024)   varenicline  (CHANTIX ) 0.5 MG tablet Take 1 tablet (0.5 mg total) by mouth 2 (two) times daily. (Patient not taking: Reported on 10/02/2024)   No facility-administered encounter medications on file as of 10/02/2024.    History: Past Medical History:  Diagnosis Date   Alcohol abuse 03/12/2016   Appendicitis 04/29/2021   CAD (coronary artery disease) cardiologist-- dr pietro   a. 03/2016 NSTEMI/PCI: LM nl, LAD nl, RI nl, LCX nl, OM2 99 ( 2.75 x 16 Synergy DES), RCA nl, EF 45-50%.   ETOH abuse    GERD (gastroesophageal reflux disease)    History of cocaine abuse (HCC)    per pt none since 2017   History of non-ST elevation myocardial infarction (NSTEMI) 03/14/2016   s/p  PCI and DES x1   Hyperlipidemia    Hypertension    Hypertensive heart disease    Ischemic cardiomyopathy    a. 03/2016 Echo: EF 35-40%, diff H, sev inflat HK, Gr2 DD;  b. 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   DES x1 to OM1    Tobacco abuse    Wears dentures    upper   Wears partial dentures    lower   Past Surgical History:  Procedure Laterality Date   CARDIAC CATHETERIZATION N/A 03/14/2016   Procedure: Left Heart Cath and Coronary Angiography;  Surgeon: Lonni JONETTA Cash, MD;  Location: [facility] INVASIVE CV LAB;  Service: Cardiovascular;  Laterality: N/A;   CORONARY ANGIOPLASTY WITH STENT PLACEMENT     FACIAL COSMETIC SURGERY     at age 57 yrs old   LACERATION REPAIR  age 74   face around eye   LAPAROSCOPIC APPENDECTOMY N/A 04/29/2021   Procedure: APPENDECTOMY LAPAROSCOPIC;  Surgeon: Rubin Calamity, MD;  Location: [facility] OR;  Service: General;  Laterality: N/A;   PARTIAL KNEE ARTHROPLASTY Left 11/13/2018   Procedure: UNICOMPARTMENTAL KNEE;  Surgeon: Beverley Evalene JONETTA, MD;  Location: [facility] ORS;  Service: Orthopedics;  Laterality: Left;   WRIST SURGERY Left 01/31/2020   Family History  Problem Relation Age of Onset   Heart attack Father    Heart attack Sister    Colon polyps Maternal Uncle    Colon cancer Neg Hx    Esophageal cancer Neg Hx    Stomach cancer Neg Hx    Rectal cancer Neg Hx  Sleep apnea Neg Hx    Social History   Occupational History   Not on file  Tobacco Use   Smoking status: Some Days    Current packs/day: 0.50    Average packs/day: 0.5 packs/day for 50.0 years (25.0 ttl pk-yrs)    Types: Cigarettes   Smokeless tobacco: Never   Tobacco comments:    some days less  6  per day   Vaping Use   Vaping status: Never Used  Substance and Sexual Activity   Alcohol use: Yes    Alcohol/week: 7.0 standard drinks of alcohol    Types: 7 Shots of liquor per week    Comment: 1/3 pint daily   Drug use: Not Currently    Types: Cocaine    Comment: 11-06-2018 per pt last cocaine when I had my heart attack  2017   Sexual activity: Not Currently   Tobacco Counseling Ready to quit: Not Answered Counseling given: Not Answered Tobacco comments: some days less  6  per day   SDOH Screenings    Food Insecurity: No Food Insecurity (10/02/2024)  Housing: Unknown (10/02/2024)  Transportation Needs: No Transportation Needs (10/02/2024)  Utilities: Not At Risk (10/02/2024)  Alcohol Screen: Low Risk (11/22/2022)  Depression (PHQ2-9): Low Risk (10/02/2024)  Financial Resource Strain: Low Risk (11/22/2022)  Physical Activity: Insufficiently Active (10/02/2024)  Social Connections: Moderately Isolated (10/02/2024)  Stress: No Stress Concern Present (10/02/2024)  Tobacco Use: High Risk (10/02/2024)  Health Literacy: Adequate Health Literacy (10/02/2024)   See flowsheets for full screening details  Depression Screen PHQ 2 & 9 Depression Scale- Over the past 2 weeks, how often have you been bothered by any of the following problems? Little interest or pleasure in doing things: 0 Feeling down, depressed, or hopeless (PHQ Adolescent also includes...irritable): 0 PHQ-2 Total Score: 0 Trouble falling or staying asleep, or sleeping too much: 0 Feeling tired or having little energy: 0 Poor appetite or overeating (PHQ Adolescent also includes...weight loss): 0 Feeling bad about yourself - or that you are a failure or have let yourself or your family down: 0 Trouble concentrating on things, such as reading the newspaper or watching television (PHQ Adolescent also includes...like school work): 0 Moving or speaking so slowly that other people could have noticed. Or the opposite - being so fidgety or restless that you have been moving around a lot more than usual: 0 Thoughts that you would be better off dead, or of hurting yourself in some way: 0 PHQ-9 Total Score: 0 If you checked off any problems, how difficult have these problems made it for you to do your work, take care of things at home, or get along with other people?: Not difficult at all  Depression Treatment Depression Interventions/Treatment : EYV7-0 Score <4 Follow-up Not Indicated     Goals Addressed             This Visit's Progress     DIET - EAT MORE FRUITS AND VEGETABLES               Objective:    There were no vitals filed for this visit. There is no height or weight on file to calculate BMI.  Hearing/Vision screen Hearing Screening - Comments:: NO AIDS Vision Screening - Comments:: READERS- DR.GROAT Immunizations and Health Maintenance Health Maintenance  Topic Date Due   Lung Cancer Screening  09/27/2007   Zoster Vaccines- Shingrix (1 of 2) Never done   Medicare Annual Wellness (AWV)  11/22/2023   COVID-19 Vaccine (3 -  2025-26 season) 05/13/2024   Colonoscopy  04/28/2027   DTaP/Tdap/Td (2 - Td or Tdap) 11/21/2032   Pneumococcal Vaccine: 50+ Years  Completed   Influenza Vaccine  Completed   Hepatitis C Screening  Completed   Meningococcal B Vaccine  Aged Out        Assessment/Plan:  This is a routine wellness examination for Jackee.  Patient Care Team: Amilibia, Jaden, DO as PCP - General Pietro Redell RAMAN, MD as PCP - Cardiology (Cardiology) [facility]  I have personally reviewed and noted the following in the patients chart:   Medical and social history Use of alcohol, tobacco or illicit drugs  Current medications and supplements including opioid prescriptions. Functional ability and status Nutritional status Physical activity Advanced directives List of other physicians Hospitalizations, surgeries, and ER visits in previous 12 months Vitals Screenings to include cognitive, depression, and falls Referrals and appointments  No orders of the defined types were placed in this encounter.  In addition, I have reviewed and discussed with patient certain preventive protocols, quality metrics, and best practice recommendations. A written personalized care plan for preventive services as well as general preventive health recommendations were provided to patient.   Jhonnie RAMAN Das, LPN   [id number]/78/7973   Return in about 1 year (around 10/02/2025).  After Visit Summary:  (MyChart) Due to this being a telephonic visit, the after visit summary with patients personalized plan was offered to patient via MyChart   Nurse Notes: utd on shots except shingrix; utd on colonoscopy  No voiced or noted concerns at this time  "

## 2024-10-03 NOTE — Progress Notes (Signed)
 Internal Medicine Clinic Attending  Case discussed with the resident at the time of the visit.  We reviewed the resident's history and exam and pertinent patient test results.  I agree with the assessment, diagnosis, and plan of care documented in the resident's note.

## 2024-10-04 ENCOUNTER — Ambulatory Visit (HOSPITAL_BASED_OUTPATIENT_CLINIC_OR_DEPARTMENT_OTHER)
Admission: RE | Admit: 2024-10-04 | Discharge: 2024-10-04 | Disposition: A | Source: Ambulatory Visit | Attending: Family Medicine | Admitting: Family Medicine

## 2024-10-04 DIAGNOSIS — J439 Emphysema, unspecified: Secondary | ICD-10-CM | POA: Diagnosis not present

## 2024-10-04 DIAGNOSIS — Z122 Encounter for screening for malignant neoplasm of respiratory organs: Secondary | ICD-10-CM | POA: Insufficient documentation

## 2024-10-04 DIAGNOSIS — I700 Atherosclerosis of aorta: Secondary | ICD-10-CM | POA: Insufficient documentation

## 2024-10-04 DIAGNOSIS — F17210 Nicotine dependence, cigarettes, uncomplicated: Secondary | ICD-10-CM | POA: Insufficient documentation

## 2024-10-04 DIAGNOSIS — Z720 Tobacco use: Secondary | ICD-10-CM | POA: Diagnosis present

## 2024-10-04 DIAGNOSIS — I2510 Atherosclerotic heart disease of native coronary artery without angina pectoris: Secondary | ICD-10-CM | POA: Diagnosis not present

## 2024-10-04 DIAGNOSIS — R918 Other nonspecific abnormal finding of lung field: Secondary | ICD-10-CM | POA: Insufficient documentation

## 2024-10-08 ENCOUNTER — Ambulatory Visit: Payer: Self-pay

## 2024-10-08 ENCOUNTER — Other Ambulatory Visit (HOSPITAL_COMMUNITY)
Admission: RE | Admit: 2024-10-08 | Discharge: 2024-10-08 | Disposition: A | Discharge status: Home / Self Care | Source: Ambulatory Visit | Attending: Internal Medicine | Admitting: Internal Medicine

## 2024-10-08 ENCOUNTER — Ambulatory Visit

## 2024-10-08 VITALS — BP 162/104 | HR 60 | Temp 98.4°F | Ht 67.0 in | Wt 169.0 lb

## 2024-10-08 DIAGNOSIS — Z113 Encounter for screening for infections with a predominantly sexual mode of transmission: Secondary | ICD-10-CM

## 2024-10-08 DIAGNOSIS — K5900 Constipation, unspecified: Secondary | ICD-10-CM

## 2024-10-08 MED ORDER — SENNA 8.6 MG PO TABS: 1.0000 | ORAL_TABLET | Freq: Every day | ORAL | 0 refills | Status: AC

## 2024-10-08 NOTE — Telephone Encounter (Signed)
 Pt was seen today by Dr Charmayne.

## 2024-10-08 NOTE — Telephone Encounter (Signed)
 FYI Only or Action Required?: FYI only for provider: appointment scheduled on 10/08/24.  Patient was last seen in primary care on 09/19/2024 by Napoleon Limes, MD.  Called Nurse Triage reporting Constipation.  Symptoms began several days ago.  Interventions attempted: OTC medications: polyethylene; xlax and Other: coffee; fluids.  Symptoms are: gradually worsening.  Triage Disposition: See Physician Within 24 Hours  Patient/caregiver understands and will follow disposition?: Yes      Message from [facility] sent at 10/08/2024  9:44 AM EST  Summary: Havent used the restroom in days   Reason for Triage: Patient called because he hasn't used the restroom in about 4 days. His stomach is cramping. When he drinks water it makes it cramp more. When he lays down at night it hurts really bad. On a pain level 1-10 pain is a 8. Constant pain/cramping. He took polyethylene on Saturday and it did not help at all. Also took xlax a few days ago and that didn't help either.            Reason for Disposition  Last bowel movement (BM) > 4 days ago  Answer Assessment - Initial Assessment Questions Pt called to report constipation x 4-5 days with associated abd pain and cramping. Pt is starting to experience increased pain, 8/10, with position changes and when trying to take in fluids. Pt denies any issues with urination. Pt reports pain at umbilicus and below, R flank pain with hx of appendectomy. Pt has tried coffee, xlax and polyethylene for symptom management without relief. In the past 5 days pt reports only having 2-3 marble sized stools; unable to recall last BM where he had relief. Pt denies n/v but with inability to eat/drink without cramping discussed eval with auscultation of bowel sounds. Discussed home care of suppository vs. Enema but that pt should be evaluated. Pt agreeable to see alternative provider for asap availability. Appointment scheduled for evaluation. Patient agrees with  plan of care, and will call back if anything changes, or if symptoms worsen.       1. STOOL PATTERN OR FREQUENCY: How often do you have a bowel movement (BM)?  (Normal range: 3 times a day to every 3 days)  When was your last BM?       Normally daily, every am after coffee   2. STRAINING: Do you have to strain to have a BM?      Pt states even with bowel hygiene and straining, no BM   3. ONSET: When did the constipation begin?     Approx 4-5 days ago   4. RECTAL PAIN: Does your rectum hurt when the stool comes out? If Yes, ask: Do you have hemorrhoids? How bad is the pain?  (Scale 1-10; or mild, moderate, severe)     No   5. BM COMPOSITION: Are the stools hard?      Pt states no stools except 2-3 marble sized stools that came out after he sat on the toilet for a long time   6. BLOOD ON STOOLS: Has there been any blood on the toilet tissue or on the surface of the BM? If Yes, ask: When was the last time?     No   7. CHRONIC CONSTIPATION: Is this a new problem for you?  If No, ask: How long have you had this problem? (days, weeks, months)      No  8. CHANGES IN DIET OR HYDRATION: Have there been any recent changes in your diet? How much  fluids are you drinking on a daily basis?  How much have you had to drink today?     No   10. LAXATIVES: Have you been using any stool softeners, laxatives, or enemas?  If Yes, ask What are you using, how often, and when was the last time?       Xlax and polyethylene    12. CAUSE: What do you think is causing the constipation?        Unsure    14. OTHER SYMPTOMS: Do you have any other symptoms? (e.g., abdomen pain, bloating, fever, vomiting)       Abdominal pain and bloating, R flank pain and umbilical pain, pt reports hx of appendectomy; denies n/v  Protocols used: Constipation-A-AH

## 2024-10-08 NOTE — Progress Notes (Signed)
 "  Established Patient Office Visit  Subjective   Patient ID: Lucas Stark, male    DOB: 05-04-1956  Age: 69 y.o. MRN: [id number]  Constipation This is a new problem. The current episode started in the past 7 days. The problem has been gradually worsening since onset. His stool frequency is 2 to 3 times per week (He says he has a bowel movement usually every morning after his coffee and brushing his teeth.). The patient is not on a high fiber diet (Diet consist mostly of peanut butter and jelly sandwiches, pork, grits, cookies). Associated symptoms include abdominal pain and bloating. Pertinent negatives include no anorexia, diarrhea, difficulty urinating, fever, hematochezia, melena, nausea, rectal pain, vomiting or weight loss. Risk factors include change in medication usage/dosage (Recently increased diuretics, also on beta-blockers.  Denies recent travel or illness.  He is engaging in unprotected sex and this does make him nervous.  He has now requested STI testing twice in the last month.). Treatments tried: He tried MiraLAX  and Ex-Lax once. The treatment provided no relief. Appendectomy several years ago  He denies weight loss, he says he has a great appetite and has continued eating but the bloating has gotten worse. He denies any urethral discharge, hematuria or penile ulcers.  He denies receptive anal intercourse. He denies worsening of symptoms with eating or epigastric burning.  Not have a history of GERD.  Past Medical History:  Diagnosis Date   Alcohol abuse 03/12/2016   Appendicitis 04/29/2021   CAD (coronary artery disease) cardiologist-- dr pietro   a. 03/2016 NSTEMI/PCI: LM nl, LAD nl, RI nl, LCX nl, OM2 99 ( 2.75 x 16 Synergy DES), RCA nl, EF 45-50%.   ETOH abuse    GERD (gastroesophageal reflux disease)    History of cocaine abuse (HCC)    per pt none since 2017   History of non-ST elevation myocardial infarction (NSTEMI) 03/14/2016   s/p  PCI and DES x1   Hyperlipidemia     Hypertension    Hypertensive heart disease    Ischemic cardiomyopathy    a. 03/2016 Echo: EF 35-40%, diff H, sev inflat HK, Gr2 DD;  b. 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 He is not toxic-appearing.  HENT:     Nose: Nose normal.  Eyes:     Extraocular Movements: Extraocular movements intact.  Cardiovascular:     Rate and Rhythm: Normal rate and regular rhythm.     Heart sounds: Normal heart sounds.  Pulmonary:     Effort: Pulmonary effort is normal.  Abdominal:     General: Bowel sounds are normal. There is distension.  Palpations: Abdomen is soft. There is no mass.     Tenderness: There is abdominal tenderness. There is no guarding or rebound.     Hernia: No hernia is present.     Comments: Generalized tenderness, more prominent on right side Generalized bloating  Skin:    General: Skin is warm.  Neurological:     General: No focal deficit present.     Mental Status: He is alert and oriented to person, place, and time.  Psychiatric:        Mood and Affect: Mood normal.        Behavior: Behavior normal.      No results found for any visits on 10/08/24.  Last CBC Lab Results  Component Value Date   WBC 4.8 09/19/2024   HGB 18.2 (H) 09/19/2024   HCT 53.5 (H) 09/19/2024   MCV 94 09/19/2024   MCH 32.1 09/19/2024   RDW 13.4 09/19/2024   PLT  165 09/19/2024   Last metabolic panel Lab Results  Component Value Date   GLUCOSE 99 09/19/2024   NA 141 09/19/2024   K 4.7 09/19/2024   CL 104 09/19/2024   CO2 24 09/19/2024   BUN 15 09/19/2024   CREATININE 1.05 09/19/2024   EGFR 78 09/19/2024   CALCIUM  9.5 09/19/2024   PROT 6.8 11/19/2021   ALBUMIN 4.5 11/19/2021   LABGLOB 2.3 11/19/2021   AGRATIO 2.0 11/19/2021   BILITOT 0.6 11/19/2021   ALKPHOS 80 11/19/2021   AST 20 11/19/2021   ALT 11 11/19/2021   ANIONGAP 12 02/08/2024   Last hemoglobin A1c Lab Results  Component Value Date   HGBA1C 6.1 (H) 09/19/2024      The ASCVD Risk score (Arnett DK, et al., 2019) failed to calculate for the following reasons:   Risk score cannot be calculated because patient has a medical history suggesting prior/existing ASCVD   * - Cholesterol units were assumed    Assessment & Plan:   Assessment & Plan Constipation, unspecified constipation type Given new onset constipation and male who had normal bowel movements 1 week ago, I suspect this is an acute episode of constipation likely multifactorial in etiology.  Given his current regimen of beta-blockers and diuretics I suspect there is some medication induced constipation.  His diuretic regimen was increased at the last office visit on 1/8.  His diet lacks fiber as well which likely also contributes.  He is also nervous about STI risk due to continued unprotected sex, although denies receptive anal intercourse, urethral discharge, dysuria or penile ulcers.  He commonly refers to the butterflies that he feels in his stomach, there may be a psychogenic component to his constipation.  Last colonoscopy in 04/2020, recommendation at that time was for repeat in 7 years.  There are no red flag signs or symptoms.  Will treat his constipation with scheduled osmotic laxatives and stimulant laxatives for 7 days.  Also recommended patient stay hydrated, eat fibrous rich foods and wear condoms.  Recommended  he schedule an office visit if his symptoms are not better in 1 week after trying the laxatives.  Consider TSH, abdominal x-ray or right upper quadrant ultrasound.  Also consider secretagogues or prokinetic agents such as lubiprostone or linaclotide. Plan MiraLAX  and senna daily Fiber rich foods and hydration    Screening examination for STI See above.  Had unremarkable urine cytology, RPR and HIV on 1/8.  He requests repeating these as he has engaged in unprotected sex since his last visit.  Counseled  on the importance of wearing condoms.  He says he has condoms he just has not used them. Plan STI workup Treat as necessary Orders:   Urine cytology ancillary only   HIV antibody (with reflex)   RPR   Return if symptoms worsen or fail to improve.    Viktoria King, DO "

## 2024-10-08 NOTE — Telephone Encounter (Signed)
 Received epic message from Dr. FORBES Lease, hello, I tried to call the patient. If he is having worsening/ severe abdominal pain and is unable to eat/drink, he needs to go to the emergency department. I tried to call him to relay this message but was unsuccessful. Can you advise him to be seen at the ED, I am worried he has a bowel obstruction? Thanks 10:03am  Messaged view after pt appt. Pt was seen. No further notes at this time.

## 2024-10-08 NOTE — Patient Instructions (Addendum)
 It was wonderful seeing you today!   I'm sorry about your constipation.   1) Drink Miralax  everyday for the next 7 days  2) Take one Senna tablet after your largest meal of the day, for the next 7 days   3) Eat fiber rich foods like grains, beans, fruits and vegetables. Make sure to stay hydrated.   If your constipation isn't better in one week, please call the office for an appointment.  If you have any questions please feel free to the call the clinic at anytime at [phone].  Have a blessed day,  Dr. Charmayne

## 2024-10-08 NOTE — Progress Notes (Deleted)
" ° °  Established Patient Office Visit  Subjective   Patient ID: Lucas Stark, male    DOB: [birth year]  Age: 69 y.o. MRN: [id number]  No chief complaint on file.  Lucas Stark is a 69 y.o. who presents to the clinic for ***. Please see problem based assessment and plan for additional details.  Acute: N/V Tolerating diet?  Gas? H/o SBO?   {History (Optional):23778}  ROS    Objective:     There were no vitals taken for this visit. {Vitals History (Optional):23777}  Physical Exam   No results found for any visits on 10/08/24.  {Labs (Optional):23779}  The ASCVD Risk score (Arnett DK, et al., 2019) failed to calculate for the following reasons:   Risk score cannot be calculated because patient has a medical history suggesting prior/existing ASCVD   * - Cholesterol units were assumed    Assessment & Plan:   Problem List Items Addressed This Visit   None   No follow-ups on file.    Damien Lease, DO  "

## 2024-10-08 NOTE — Telephone Encounter (Signed)
 Pt called - he thought he missed a call. I do not see any out going calls. Pt will be here for appt this afternoon.

## 2024-10-09 ENCOUNTER — Ambulatory Visit: Payer: Self-pay

## 2024-10-09 ENCOUNTER — Telehealth: Payer: Self-pay | Admitting: *Deleted

## 2024-10-09 DIAGNOSIS — A539 Syphilis, unspecified: Secondary | ICD-10-CM

## 2024-10-09 DIAGNOSIS — Z720 Tobacco use: Secondary | ICD-10-CM

## 2024-10-09 LAB — URINE CYTOLOGY ANCILLARY ONLY
Chlamydia: NEGATIVE
Comment: NEGATIVE
Comment: NORMAL
Neisseria Gonorrhea: NEGATIVE

## 2024-10-09 LAB — HIV ANTIBODY (ROUTINE TESTING W REFLEX): HIV Screen 4th Generation wRfx: NONREACTIVE

## 2024-10-09 NOTE — Telephone Encounter (Signed)
 Call from Diane with [facility] with results of patient's CT done on 10/08/2024.  Will forward to Dr. Charmayne for follow up.

## 2024-10-10 ENCOUNTER — Telehealth: Payer: Self-pay | Admitting: *Deleted

## 2024-10-10 ENCOUNTER — Ambulatory Visit: Admitting: *Deleted

## 2024-10-10 DIAGNOSIS — Z7252 High risk homosexual behavior: Secondary | ICD-10-CM

## 2024-10-10 LAB — RPR, QUANT+TP ABS (REFLEX)
Rapid Plasma Reagin, Quant: 1:1 {titer} — ABNORMAL HIGH
T Pallidum Abs: REACTIVE — AB

## 2024-10-10 LAB — SYPHILIS: RPR W/REFLEX TO RPR TITER AND TREPONEMAL ANTIBODIES, TRADITIONAL SCREENING AND DIAGNOSIS ALGORITHM: RPR Ser Ql: REACTIVE — AB

## 2024-10-10 MED ORDER — PENICILLIN G BENZATHINE 1200000 UNIT/2ML IM SUSY
2.4000 10*6.[IU] | PREFILLED_SYRINGE | Freq: Once | INTRAMUSCULAR | Status: AC
  Administered 2024-10-10: 2.4 10*6.[IU] via INTRAMUSCULAR

## 2024-10-10 NOTE — Telephone Encounter (Signed)
 Call to patient.  Scheduled for an appointment this morning for his PCN Injection.

## 2024-10-10 NOTE — Addendum Note (Signed)
 Addended by: CHARMAYNE HOLMES on: 10/10/2024 07:20 AM   Modules accepted: Orders

## 2024-10-10 NOTE — Telephone Encounter (Signed)
-----   Message from [place] sent at 10/10/2024  7:20 AM EST ----- Good morning,   IM penicillin  G 2.4 million units for a single dose ordered.   Thank you Viktoria ----- Message ----- From: Burnett Kenneth FALCON, RN Sent: 10/09/2024   4:43 PM EST To: Viktoria King, DO  Orders are needed for the dosing of the Penicillin  and how often. 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 Can you call him and schedule an appointment to have penicillin  injection? He is also going to tell his partner so she can get treated.   I haven't had to do this for a patient before so please let me know what else I need to do for this to work.   Thank you so much Viktoria

## 2024-10-12 NOTE — Progress Notes (Signed)
 Internal Medicine Clinic Attending  Case discussed with the resident at the time of the visit.  We reviewed the resident's history and exam and pertinent patient test results.  I agree with the assessment, diagnosis, and plan of care documented in the resident's note.

## 2024-11-27 ENCOUNTER — Ambulatory Visit: Admitting: Neurology
